# Patient Record
Sex: Male | Born: 1965
Health system: Southern US, Community
[De-identification: ages and names within clinical notes are randomized; demographics above are authoritative.]

## PROBLEM LIST (undated history)

## (undated) DIAGNOSIS — G4730 Sleep apnea, unspecified: Secondary | ICD-10-CM

## (undated) DIAGNOSIS — T7840XA Allergy, unspecified, initial encounter: Secondary | ICD-10-CM

## (undated) DIAGNOSIS — R51 Headache: Secondary | ICD-10-CM

## (undated) DIAGNOSIS — G4733 Obstructive sleep apnea (adult) (pediatric): Secondary | ICD-10-CM

## (undated) DIAGNOSIS — R0789 Other chest pain: Secondary | ICD-10-CM

## (undated) DIAGNOSIS — R197 Diarrhea, unspecified: Secondary | ICD-10-CM

## (undated) DIAGNOSIS — K219 Gastro-esophageal reflux disease without esophagitis: Secondary | ICD-10-CM

## (undated) DIAGNOSIS — K589 Irritable bowel syndrome without diarrhea: Secondary | ICD-10-CM

## (undated) DIAGNOSIS — K56609 Unspecified intestinal obstruction, unspecified as to partial versus complete obstruction: Secondary | ICD-10-CM

## (undated) DIAGNOSIS — E119 Type 2 diabetes mellitus without complications: Secondary | ICD-10-CM

## (undated) DIAGNOSIS — R42 Dizziness and giddiness: Secondary | ICD-10-CM

## (undated) DIAGNOSIS — R059 Cough, unspecified: Secondary | ICD-10-CM

## (undated) DIAGNOSIS — R05 Cough: Secondary | ICD-10-CM

## (undated) HISTORY — DX: Other chest pain: R07.89

## (undated) HISTORY — DX: Cough, unspecified: R05.9

## (undated) HISTORY — DX: Dizziness and giddiness: R42

## (undated) HISTORY — DX: Type 2 diabetes mellitus without complications: E11.9

## (undated) HISTORY — DX: Unspecified intestinal obstruction, unspecified as to partial versus complete obstruction: K56.609

## (undated) HISTORY — DX: Diarrhea, unspecified: R19.7

## (undated) HISTORY — DX: Cough: R05

## (undated) HISTORY — DX: Headache: R51

## (undated) HISTORY — DX: Irritable bowel syndrome, unspecified: K58.9

## (undated) HISTORY — DX: Obstructive sleep apnea (adult) (pediatric): G47.33

## (undated) HISTORY — DX: Allergy, unspecified, initial encounter: T78.40XA

## (undated) HISTORY — DX: Sleep apnea, unspecified: G47.30

## (undated) HISTORY — DX: Gastro-esophageal reflux disease without esophagitis: K21.9

## (undated) HISTORY — PX: LAPAROTOMY: SHX154

---

## 1992-12-08 HISTORY — PX: COLECTOMY: SHX59

## 1999-02-20 ENCOUNTER — Ambulatory Visit (HOSPITAL_COMMUNITY): Admission: RE | Admit: 1999-02-20 | Discharge: 1999-02-20 | Payer: Self-pay | Admitting: *Deleted

## 2000-09-28 ENCOUNTER — Encounter: Payer: Self-pay | Admitting: Internal Medicine

## 2000-09-28 ENCOUNTER — Encounter: Admission: RE | Admit: 2000-09-28 | Discharge: 2000-09-28 | Payer: Self-pay | Admitting: General Surgery

## 2000-09-28 ENCOUNTER — Encounter: Payer: Self-pay | Admitting: General Surgery

## 2000-10-01 ENCOUNTER — Encounter: Admission: RE | Admit: 2000-10-01 | Discharge: 2000-10-01 | Payer: Self-pay | Admitting: General Surgery

## 2001-10-13 ENCOUNTER — Inpatient Hospital Stay (HOSPITAL_COMMUNITY): Admission: EM | Admit: 2001-10-13 | Discharge: 2001-10-31 | Payer: Self-pay | Admitting: *Deleted

## 2001-10-13 ENCOUNTER — Encounter: Payer: Self-pay | Admitting: Family Medicine

## 2001-10-21 ENCOUNTER — Encounter: Payer: Self-pay | Admitting: Family Medicine

## 2001-10-28 ENCOUNTER — Encounter: Payer: Self-pay | Admitting: General Surgery

## 2001-11-08 ENCOUNTER — Encounter: Admission: RE | Admit: 2001-11-08 | Discharge: 2001-11-08 | Payer: Self-pay | Admitting: Family Medicine

## 2003-12-09 HISTORY — PX: GASTROSTOMY: SHX151

## 2004-05-26 ENCOUNTER — Inpatient Hospital Stay (HOSPITAL_COMMUNITY): Admission: EM | Admit: 2004-05-26 | Discharge: 2004-06-13 | Payer: Self-pay | Admitting: Emergency Medicine

## 2004-05-27 ENCOUNTER — Encounter (INDEPENDENT_AMBULATORY_CARE_PROVIDER_SITE_OTHER): Payer: Self-pay | Admitting: *Deleted

## 2004-06-11 ENCOUNTER — Encounter (INDEPENDENT_AMBULATORY_CARE_PROVIDER_SITE_OTHER): Payer: Self-pay | Admitting: *Deleted

## 2004-07-02 ENCOUNTER — Encounter (INDEPENDENT_AMBULATORY_CARE_PROVIDER_SITE_OTHER): Payer: Self-pay | Admitting: *Deleted

## 2007-12-09 HISTORY — PX: SPHINCTEROTOMY: SHX5279

## 2007-12-09 HISTORY — PX: CHOLECYSTECTOMY: SHX55

## 2008-02-10 ENCOUNTER — Encounter: Payer: Self-pay | Admitting: Internal Medicine

## 2008-02-15 ENCOUNTER — Inpatient Hospital Stay (HOSPITAL_COMMUNITY): Admission: EM | Admit: 2008-02-15 | Discharge: 2008-03-07 | Payer: Self-pay | Admitting: Emergency Medicine

## 2008-02-16 ENCOUNTER — Encounter: Payer: Self-pay | Admitting: Internal Medicine

## 2008-02-17 ENCOUNTER — Encounter (INDEPENDENT_AMBULATORY_CARE_PROVIDER_SITE_OTHER): Payer: Self-pay | Admitting: General Surgery

## 2008-02-18 ENCOUNTER — Encounter (INDEPENDENT_AMBULATORY_CARE_PROVIDER_SITE_OTHER): Payer: Self-pay | Admitting: *Deleted

## 2008-03-07 ENCOUNTER — Encounter: Payer: Self-pay | Admitting: Internal Medicine

## 2008-03-16 ENCOUNTER — Encounter: Payer: Self-pay | Admitting: Internal Medicine

## 2008-04-06 ENCOUNTER — Ambulatory Visit (HOSPITAL_BASED_OUTPATIENT_CLINIC_OR_DEPARTMENT_OTHER): Admission: RE | Admit: 2008-04-06 | Discharge: 2008-04-06 | Payer: Self-pay | Admitting: General Surgery

## 2008-04-06 ENCOUNTER — Encounter: Payer: Self-pay | Admitting: Internal Medicine

## 2008-04-12 ENCOUNTER — Encounter: Payer: Self-pay | Admitting: Internal Medicine

## 2008-07-05 ENCOUNTER — Encounter: Payer: Self-pay | Admitting: Internal Medicine

## 2009-03-23 IMAGING — CR DG ABDOMEN ACUTE W/ 1V CHEST
1 series · 1 of 1 positions shown · non-contrast
Comparison: CT abdomen and pelvis the same day.

CLINICAL DATA: Cholecystitis, abdominal pain and nausea.
 ACUTE ABDOMEN SERIES:

[view not recorded]
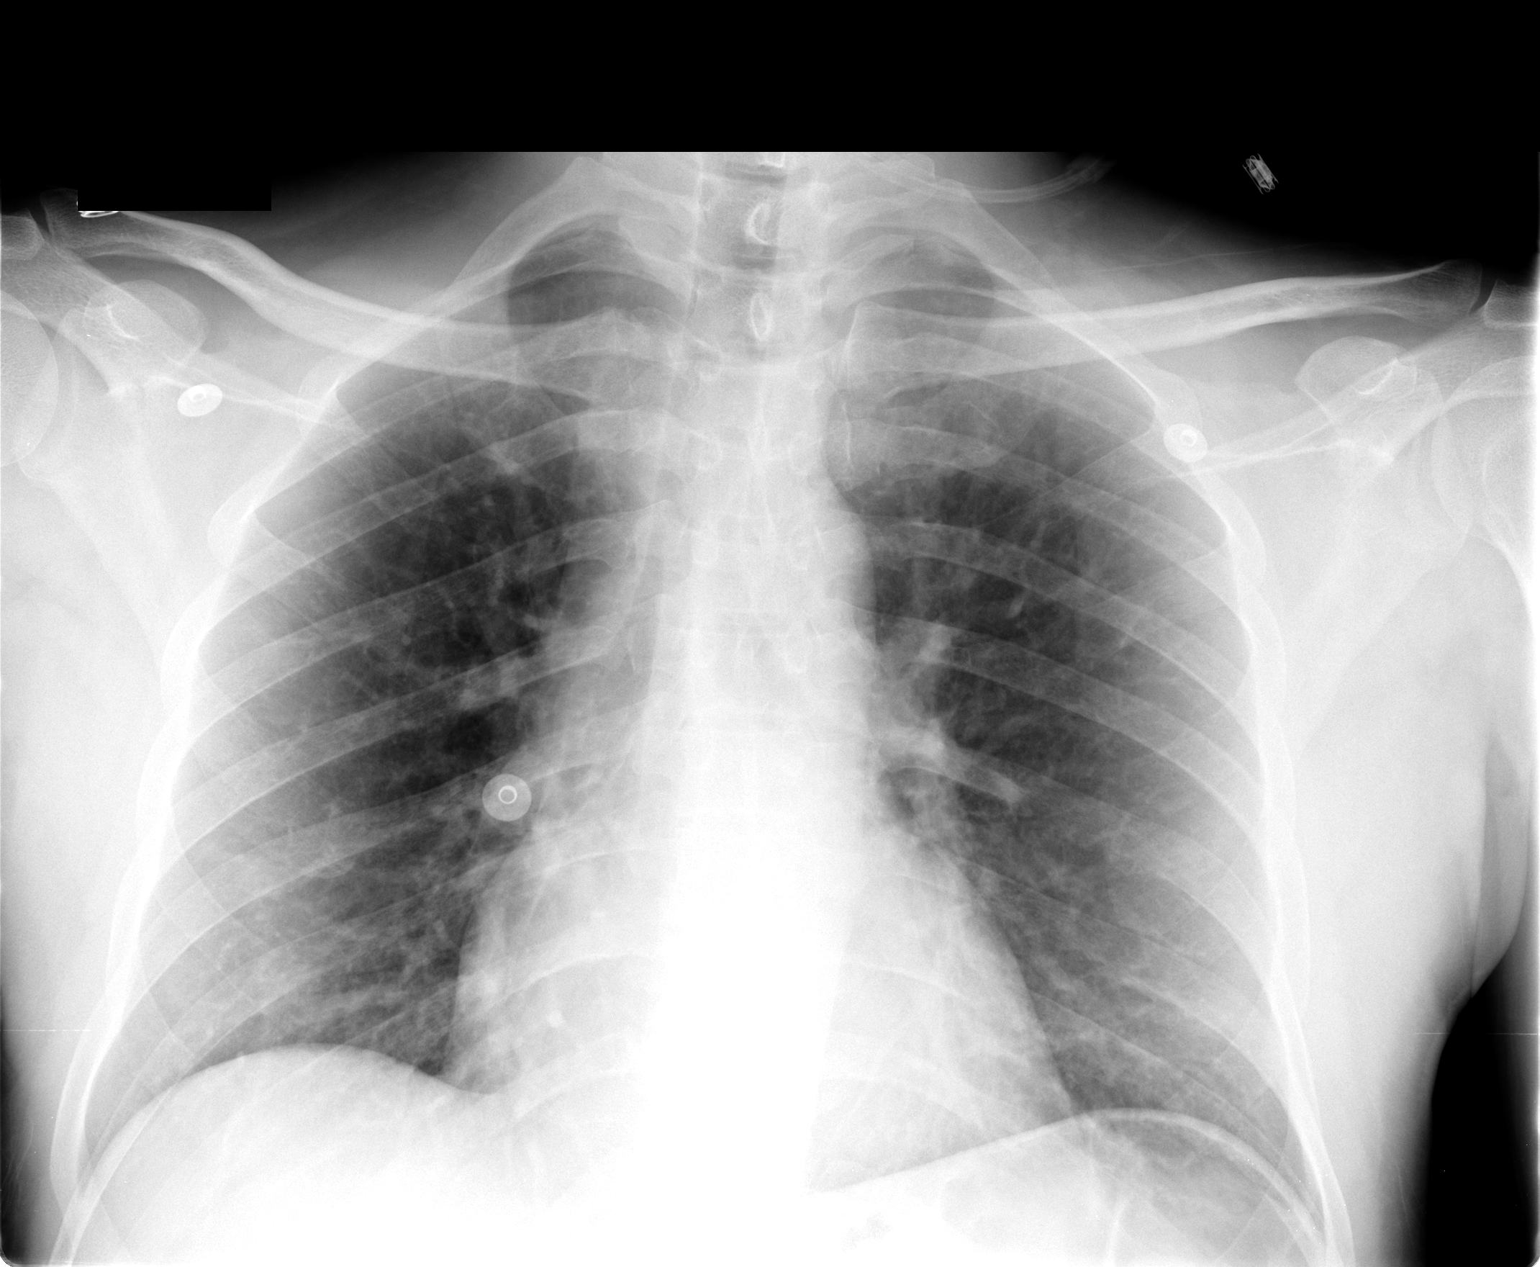

[1 of 1 positions shown; findings below may reference images not displayed]

FINDINGS: Trachea is midline.  Heart size within normal limits.  Lungs are clear.
 Two views of the abdomen show retained contrast in the colon.  There is small bowel dilatation centrally.  Gas is seen in the rectum.
IMPRESSION: Bowel gas pattern favors partial small bowel obstruction.

## 2009-03-23 IMAGING — CT CT PELVIS W/ CM
2 of 5 series · 16 of 46 positions shown, 18 images · IV contrast (APPLIED)
Comparison: Radiographs from 06/10/04 and earlier.

CLINICAL DATA: 42 year-old-male with history of prior bowel surgery for intestinal dysmotility and intermittent obstruction.  The patient now reports diffuse abdominal pain without nausea or vomiting. Also a history of cholelithiasis and possibly biliary colic.  
 ABDOMEN CT WITH CONTRAST:
TECHNIQUE: Multidetector CT imaging of the abdomen was performed following the standard protocol during bolus administration of intravenous contrast.
 Contrast:  100 cc Omnipaque 300.
TECHNIQUE: Multidetector CT imaging of the pelvis was performed following the standard protocol during bolus administration of intravenous contrast.

[Series 2: abd/pelv with 5.0 b31f st · axial · 0.72mm/px · z∈[-554,-78]mm · 13 of 107 slices shown, 15 images]
[im 6/107  soft-tissue]
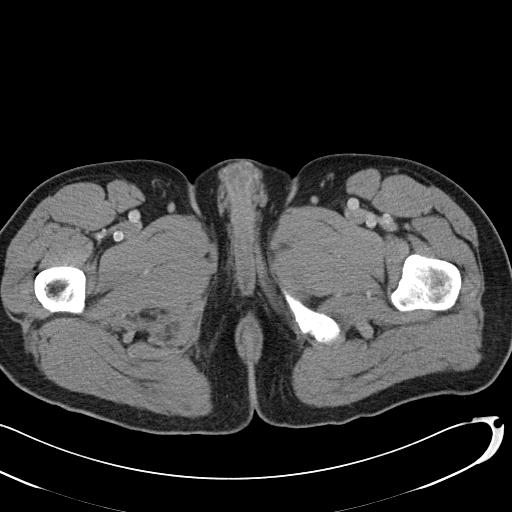
[im 6/107  bone]
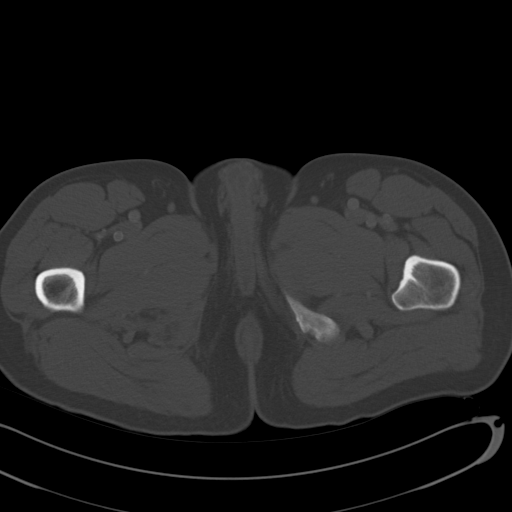
[im 17/107  soft-tissue]
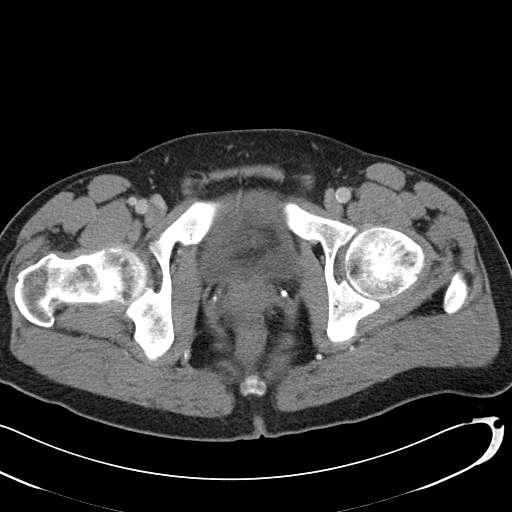
[im 23/107  soft-tissue]
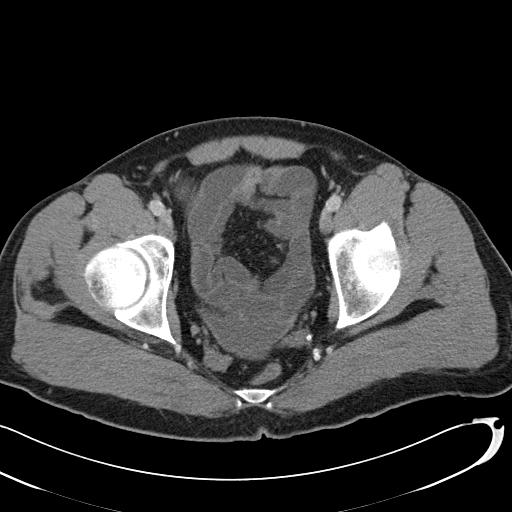
[im 28/107  soft-tissue]
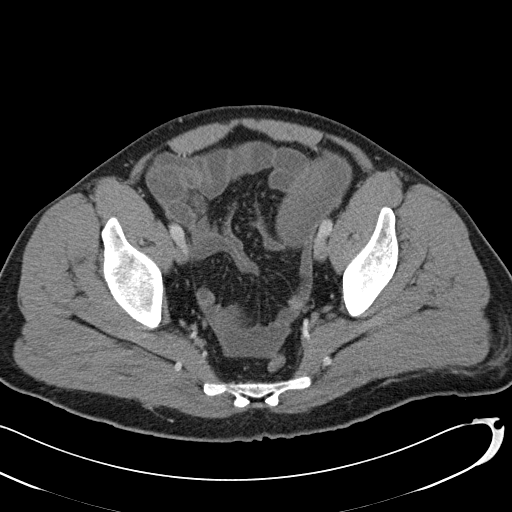
[im 40/107  soft-tissue]
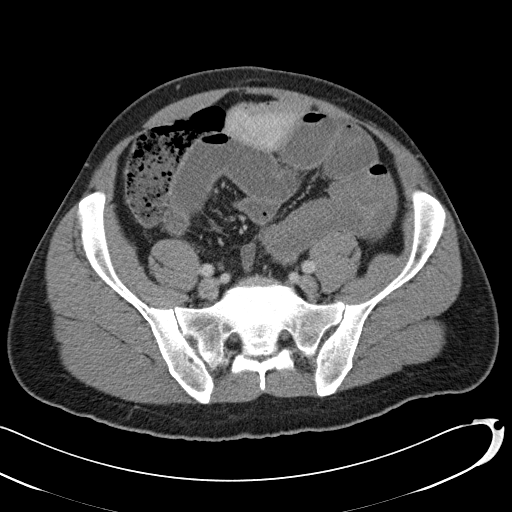
[im 45/107  soft-tissue]
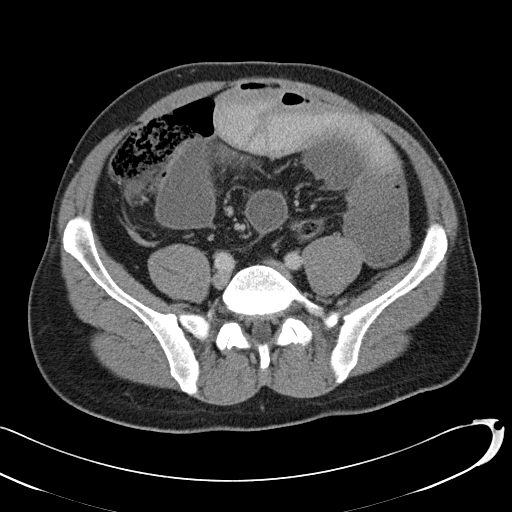
[im 56/107  soft-tissue]
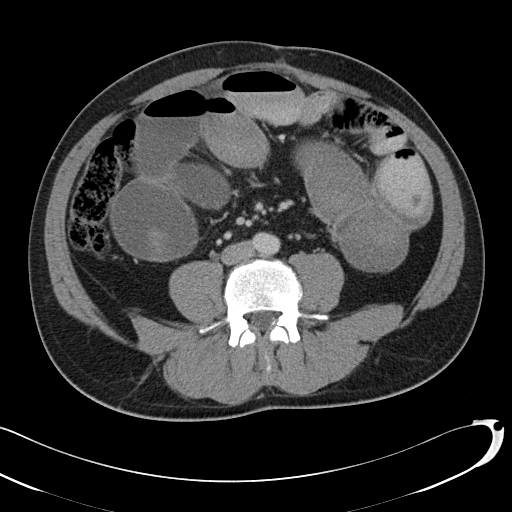
[im 62/107  soft-tissue]
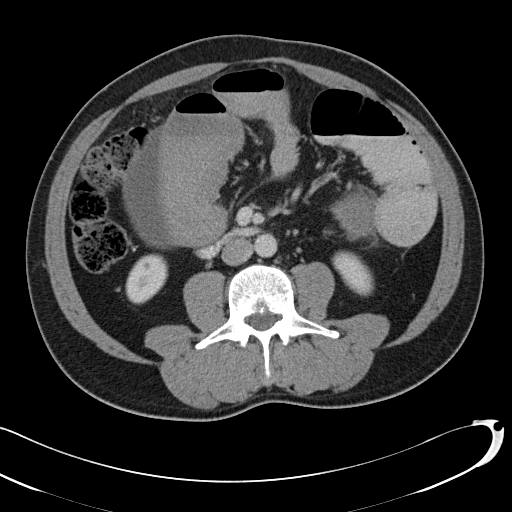
[im 67/107  soft-tissue]
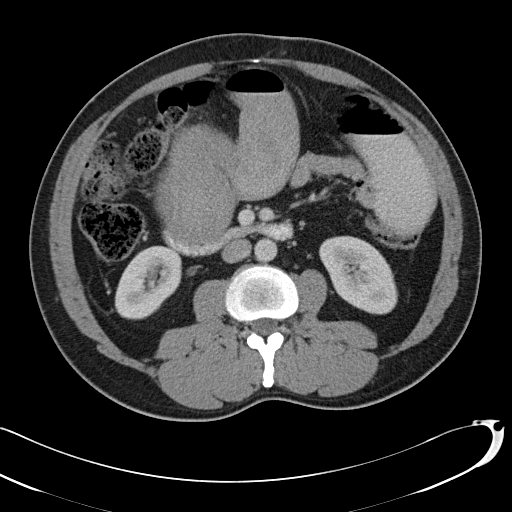
[im 67/107  bone]
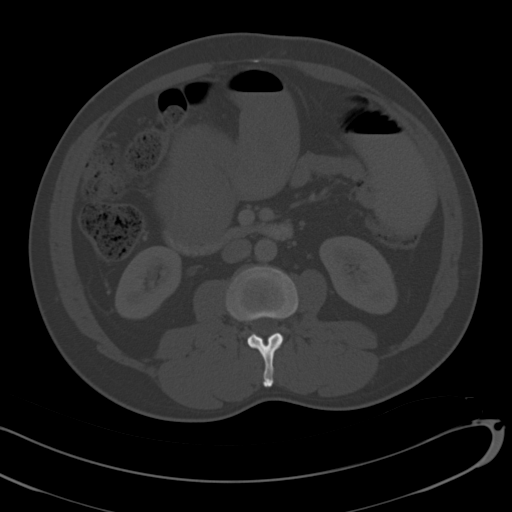
[im 79/107  soft-tissue]
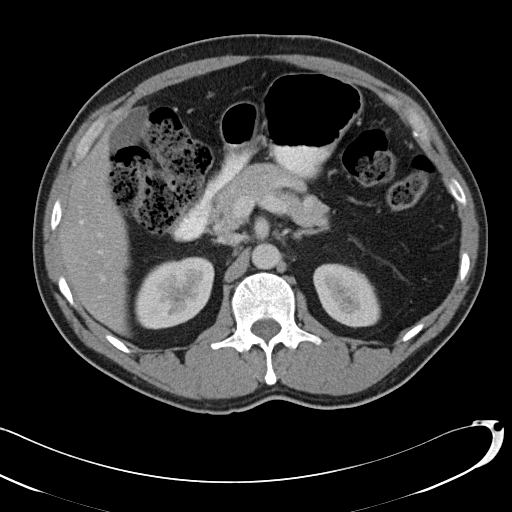
[im 84/107  soft-tissue]
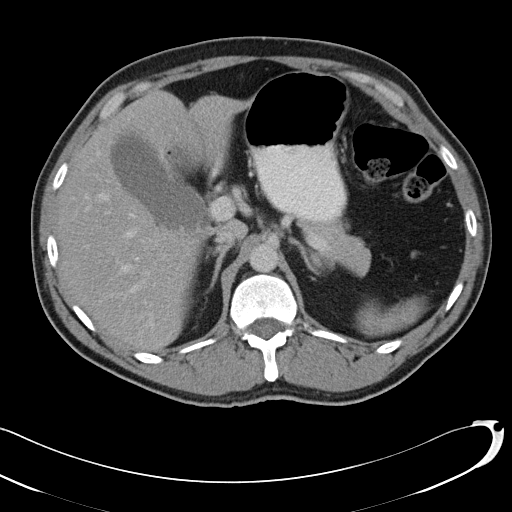
[im 90/107  soft-tissue]
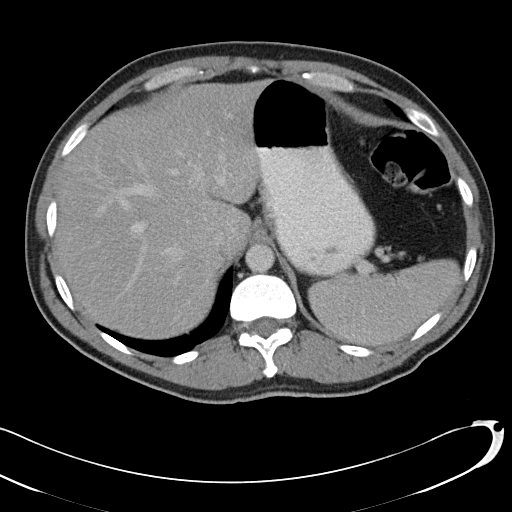
[im 101/107  soft-tissue]
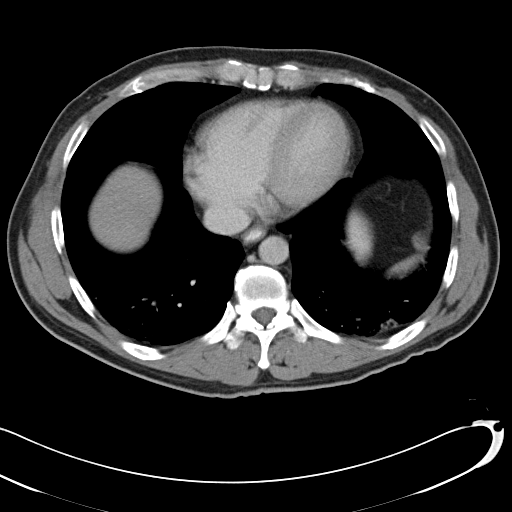

[Series 5: abd/pelv with 2.0 spo cor st · coronal · 1.04mm/px · 3 of 142 slices shown]
[im 48/142  soft-tissue]
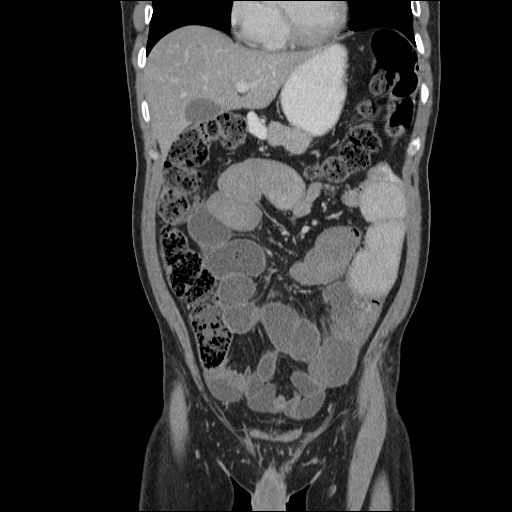
[im 63/142  soft-tissue]
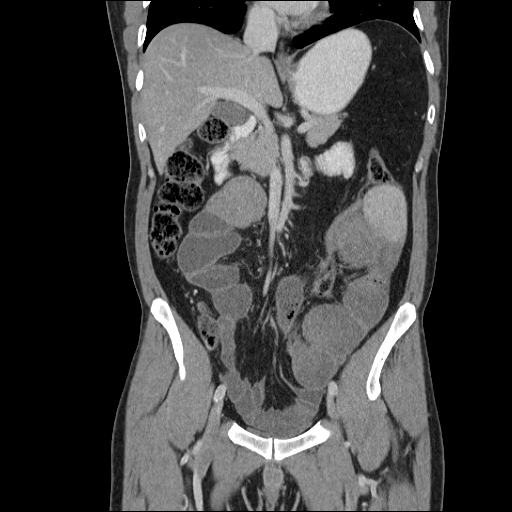
[im 79/142  soft-tissue]
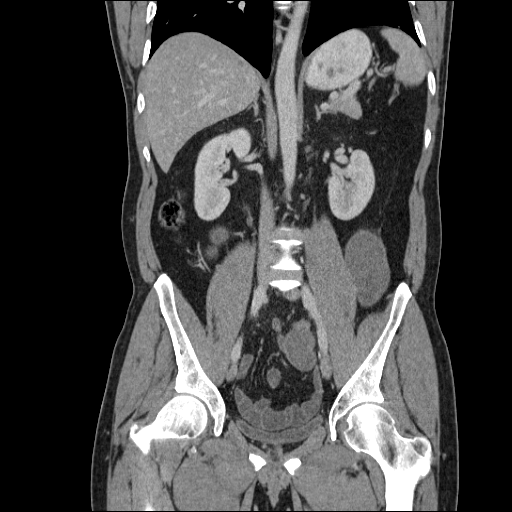

[16 of 46 positions shown; findings below may reference images not displayed]

FINDINGS: Dependent atelectasis at the lung bases.  Fatty infiltration of the liver. There is a subcentimeter low-density area in the right lobe which is too small to characterize.   There are dependent gallstones seen in the region of the gallbladder neck.  Major vascular structures including the portal venous system are patent.  Normal spleen, pancreas, adrenal glands and left kidney.  The right kidney is remarkable for a tiny lower pole cortical low-density area (probably a benign cyst) and a nonobstructing punctate lower pole calculus.  No lymphadenopathy or free fluid. No free air. 
 The stomach, duodenum and 1st segment of jejunum are decompressed, however, the small bowel rapidly becomes dilated up to 4 cm and multiple dilated stacked loops are present throughout the abdomen.  A relatively short transition to decompress loops is seen in the mid abdomen (series 2, images 57 through 77 where a loop in the central mesentery extends into the midline, inferiorly and then becomes decompressed.   Multiple decompressed terminal ileal loops are identified.    This may reflect adhesive disease.  The colon is remarkable for a moderate to large amount of retained stool proximally but is otherwise normal.  The descending and rectosigmoid colon are decompressed.  No suspicious osseous lesion.
IMPRESSION: 1.  High-grade small bowel obstruction with transition point seen in the central abdomen at the sacral level and decompressed terminal ileal loops.  This may reflect adhesive disease given the history of prior abdominal surgeries. 
 2.  Moderate to large amount of retained stool in the proximal colon, the distal colon is decompressed.   
 3.  Cholelithiasis.  
 4.  Nonobstructing right nephrolithiasis. 
 PELVIS CT WITH CONTRAST:
FINDINGS: Decompressed terminal ileal loops.  The bladder is decompressed and normal.  No free fluid.  Rectosigmoid colon is decompressed.  No lymphadenopathy.  No suspicious osseous lesion.
IMPRESSION: No acute findings in the pelvis.

## 2009-03-28 ENCOUNTER — Encounter: Payer: Self-pay | Admitting: Internal Medicine

## 2009-04-19 ENCOUNTER — Telehealth: Payer: Self-pay | Admitting: Gastroenterology

## 2009-08-01 ENCOUNTER — Encounter: Payer: Self-pay | Admitting: Internal Medicine

## 2009-08-08 ENCOUNTER — Encounter: Payer: Self-pay | Admitting: Internal Medicine

## 2009-08-31 ENCOUNTER — Encounter: Payer: Self-pay | Admitting: Internal Medicine

## 2009-09-13 ENCOUNTER — Encounter: Payer: Self-pay | Admitting: Internal Medicine

## 2009-09-13 ENCOUNTER — Encounter: Admission: RE | Admit: 2009-09-13 | Discharge: 2009-09-13 | Payer: Self-pay | Admitting: Gastroenterology

## 2009-09-26 ENCOUNTER — Encounter: Payer: Self-pay | Admitting: Internal Medicine

## 2009-11-30 ENCOUNTER — Ambulatory Visit: Payer: Self-pay | Admitting: Family Medicine

## 2009-12-01 ENCOUNTER — Inpatient Hospital Stay (HOSPITAL_COMMUNITY): Admission: EM | Admit: 2009-12-01 | Discharge: 2009-12-07 | Payer: Self-pay | Admitting: Emergency Medicine

## 2009-12-17 ENCOUNTER — Telehealth: Payer: Self-pay | Admitting: Internal Medicine

## 2010-01-02 ENCOUNTER — Telehealth (INDEPENDENT_AMBULATORY_CARE_PROVIDER_SITE_OTHER): Payer: Self-pay

## 2010-01-02 ENCOUNTER — Encounter (INDEPENDENT_AMBULATORY_CARE_PROVIDER_SITE_OTHER): Payer: Self-pay

## 2010-02-05 ENCOUNTER — Telehealth: Payer: Self-pay | Admitting: Internal Medicine

## 2010-02-05 ENCOUNTER — Ambulatory Visit: Payer: Self-pay | Admitting: Internal Medicine

## 2010-02-05 DIAGNOSIS — R142 Eructation: Secondary | ICD-10-CM

## 2010-02-05 DIAGNOSIS — Z8719 Personal history of other diseases of the digestive system: Secondary | ICD-10-CM | POA: Insufficient documentation

## 2010-02-05 DIAGNOSIS — R143 Flatulence: Secondary | ICD-10-CM

## 2010-02-05 DIAGNOSIS — R141 Gas pain: Secondary | ICD-10-CM | POA: Insufficient documentation

## 2010-02-05 DIAGNOSIS — R197 Diarrhea, unspecified: Secondary | ICD-10-CM | POA: Insufficient documentation

## 2010-02-11 ENCOUNTER — Telehealth: Payer: Self-pay | Admitting: Internal Medicine

## 2010-03-12 ENCOUNTER — Ambulatory Visit: Payer: Self-pay | Admitting: Internal Medicine

## 2010-03-12 DIAGNOSIS — K589 Irritable bowel syndrome without diarrhea: Secondary | ICD-10-CM | POA: Insufficient documentation

## 2010-03-12 DIAGNOSIS — K219 Gastro-esophageal reflux disease without esophagitis: Secondary | ICD-10-CM | POA: Insufficient documentation

## 2010-04-05 ENCOUNTER — Encounter: Admission: RE | Admit: 2010-04-05 | Discharge: 2010-04-05 | Payer: Self-pay | Admitting: Family Medicine

## 2010-04-11 ENCOUNTER — Telehealth: Payer: Self-pay | Admitting: Internal Medicine

## 2010-08-19 ENCOUNTER — Telehealth: Payer: Self-pay | Admitting: Internal Medicine

## 2010-08-20 ENCOUNTER — Ambulatory Visit: Payer: Self-pay | Admitting: Gastroenterology

## 2010-08-20 DIAGNOSIS — R519 Headache, unspecified: Secondary | ICD-10-CM | POA: Insufficient documentation

## 2010-08-20 DIAGNOSIS — R05 Cough: Secondary | ICD-10-CM

## 2010-08-20 DIAGNOSIS — R51 Headache: Secondary | ICD-10-CM | POA: Insufficient documentation

## 2010-08-20 DIAGNOSIS — R059 Cough, unspecified: Secondary | ICD-10-CM | POA: Insufficient documentation

## 2010-08-22 ENCOUNTER — Telehealth: Payer: Self-pay | Admitting: Internal Medicine

## 2010-09-04 ENCOUNTER — Telehealth: Payer: Self-pay | Admitting: Internal Medicine

## 2010-09-16 ENCOUNTER — Ambulatory Visit: Payer: Self-pay | Admitting: Internal Medicine

## 2010-09-30 ENCOUNTER — Telehealth: Payer: Self-pay | Admitting: Internal Medicine

## 2010-10-07 ENCOUNTER — Ambulatory Visit: Payer: Self-pay | Admitting: Pulmonary Disease

## 2010-10-07 ENCOUNTER — Encounter: Payer: Self-pay | Admitting: Pulmonary Disease

## 2010-10-10 ENCOUNTER — Encounter: Admission: RE | Admit: 2010-10-10 | Discharge: 2010-10-10 | Payer: Self-pay | Admitting: General Surgery

## 2010-10-10 ENCOUNTER — Encounter: Payer: Self-pay | Admitting: Internal Medicine

## 2010-10-23 ENCOUNTER — Ambulatory Visit: Payer: Self-pay | Admitting: Pulmonary Disease

## 2010-10-23 DIAGNOSIS — G4733 Obstructive sleep apnea (adult) (pediatric): Secondary | ICD-10-CM | POA: Insufficient documentation

## 2010-11-11 ENCOUNTER — Ambulatory Visit: Payer: Self-pay | Admitting: Pulmonary Disease

## 2010-11-20 ENCOUNTER — Telehealth (INDEPENDENT_AMBULATORY_CARE_PROVIDER_SITE_OTHER): Payer: Self-pay | Admitting: *Deleted

## 2010-11-25 ENCOUNTER — Telehealth (INDEPENDENT_AMBULATORY_CARE_PROVIDER_SITE_OTHER): Payer: Self-pay | Admitting: *Deleted

## 2010-12-20 ENCOUNTER — Ambulatory Visit: Admit: 2010-12-20 | Payer: Self-pay | Admitting: Pulmonary Disease

## 2011-01-07 NOTE — Letter (Signed)
Summary: [facility]   Imported By: Lester [place] 02/12/2010 11:29:27  _____________________________________________________________________  External Attachment:    Type:   Image     Comment:   External Document

## 2011-01-07 NOTE — Letter (Signed)
Summary: [facility]   Imported By: Lester [place] 02/12/2010 11:30:36  _____________________________________________________________________  External Attachment:    Type:   Image     Comment:   External Document

## 2011-01-07 NOTE — Procedures (Signed)
Summary: [facility]   Imported By: Lester [place] 11/04/2010 07:35:59  _____________________________________________________________________  External Attachment:    Type:   Image     Comment:   External Document

## 2011-01-07 NOTE — Progress Notes (Signed)
Summary: Pain in the neck  Phone Note Call from Patient   Call For: Dr Leone Payor Reason for Call: Talk to Nurse Summary of Call: Pain in the neck. Initial call taken by: Leanor Kail [facility],  September 30, 2010 1:56 PM  Follow-up for Phone Call        Patient  wants Dr Leone Payor to refer him to someone because of his neck pain.  I have advised him he should start with Dr Perrin Maltese his primary care and have them refer him out if necessary.  Dr Leone Payor he would like you to still be aware and see if you have any ideas.  He c/o stiff neck and difficulty turning his head.   Follow-up by: Darcey Nora RN, CGRN,  September 30, 2010 2:21 PM  Additional Follow-up for Phone Call Additional follow up Details #1::        Primary care eval Additional Follow-up by: Iva Boop MD, Clementeen Graham,  October 01, 2010 8:33 AM    Additional Follow-up for Phone Call Additional follow up Details #2::    I have left a voicemail for the patient with Dr Marvell Fuller recommendations. Follow-up by: Darcey Nora RN, CGRN,  October 01, 2010 8:37 AM

## 2011-01-07 NOTE — Progress Notes (Signed)
Summary: TRIAGE  Phone Note Call from Patient Call back at Home Phone [phone]   Caller: Patient Call For: Dr Leone Payor Reason for Call: Talk to Nurse Summary of Call: Patient wants to be seen asap states that he could not sleep last night with acid reflux, stomach pain, constipation. Initial call taken by: Tawni Levy,  August 22, 2010 2:22 PM  Follow-up for Phone Call        Was seen by Willette Cluster NP 08-20-10. Has been taking Nexium two times a day, no improvement, pt. states he feels worse.  C/O cough, stomache/chest pain,  headache, constipation X2 days.  Denies n/v, fever, blood, black stools.   DR.GESSNER PLEASE ADVISE  Follow-up by: Laureen Ochs LPN,  August 22, 2010 3:21 PM  Additional Follow-up for Phone Call Additional follow up Details #1::        Metronidazole rx written for probable bacterial overgrowth remind him we used antibiotics before with success do not have samples of xifaxan this time and it is not on his formulary he needs to give this treatment and what Gunnar Fusi told him more time for what sounds like exacerbation of chronic problems. he should see me in October  Additional Follow-up by: Iva Boop MD, Clementeen Graham,  August 22, 2010 4:03 PM    Additional Follow-up for Phone Call Additional follow up Details #2::    Above MD orders reviewed with patient. Pt. to keep scheduled office visit on 09-16-10 at 9:30am. Pt. instructed to call back as needed.  Follow-up by: Laureen Ochs LPN,  August 22, 2010 4:20 PM  New/Updated Medications: METRONIDAZOLE 250 MG  TABS (METRONIDAZOLE) Take 1 three times a day  times 10 days Prescriptions: METRONIDAZOLE 250 MG  TABS (METRONIDAZOLE) Take 1 three times a day  times 10 days  #30 x 0   Entered and Authorized by:   Iva Boop MD, [facility]   Signed by:   Iva Boop MD, FACG on 08/22/2010   Method used:   Electronically to        [facility] [phone]* (retail)       [address]       [place], [place]  [address]       Ph: [phone]       Fax: [phone]   RxID:   [id number]

## 2011-01-07 NOTE — Progress Notes (Signed)
Summary: Schedule appt  Phone Note Call from Patient Call back at Home Phone [phone]   Caller: Patient Call For: Pt  Summary of Call: Patient  called updet we had to reschedule his office visit with Dr Leone Payor.  Patient  wants to keep his original appointment.  I explained to him that Dr Leone Payor requested extra time with him and that we had to reschedule his office visit.  Patient  was demanding to be seen tomorrow .  Patient  reports he was in the hospital 2 weeks ago for obstuction and that he had an incident last week with pain.  Patient  reports he was able to stop eating and change to liquids and the pain resolved.  Patient says he was to be referred to the [facility] and that he can get an appointment there before here.  I advised him I would discuss this with Dr Leone Payor and we will call him back.  I did discuss the above with Dr Leone Payor as of now his appointment is still scheduled for 02-05-10, Dr Leone Payor will review his previous records and at some point contact the patient.  Patient  was last seen at Dr [facility] office 12-14-09. Initial call taken by: Darcey Nora RN, CGRN,  January 02, 2010 1:36 PM  Follow-up for Phone Call        I am unable to see him sooner for this chronic problem Follow-up by: Iva Boop MD, [facility] Graham,  January 17, 2010 1:38 PM

## 2011-01-07 NOTE — Letter (Signed)
Summary: [facility]   Imported By: Sherian Rein 11/06/2010 10:46:36  _____________________________________________________________________  External Attachment:    Type:   Image     Comment:   External Document

## 2011-01-07 NOTE — Letter (Signed)
Summary: [facility]  [facility]   Imported By: Lester [place] 02/12/2010 11:31:51  _____________________________________________________________________  External Attachment:    Type:   Image     Comment:   External Document

## 2011-01-07 NOTE — Letter (Signed)
Summary: [facility]  [facility]   Imported By: Lester [place] 02/12/2010 11:40:19  _____________________________________________________________________  External Attachment:    Type:   Image     Comment:   External Document

## 2011-01-07 NOTE — Assessment & Plan Note (Signed)
Summary: ABD.PAIN, NAUSEA, COUGH      (DR.GESSNER PT.)   DEBORAH   History of Present Illness Visit Type: Follow-up Visit Primary GI MD: Stan Head MD [facility] Primary Provider: Robert Bellow, MD Requesting Provider: Glenna Fellows, MD Chief Complaint: ABD pain, chest pain, cough, nausea x 3 weeks History of Present Illness:   Patient is a 45 year old male from [place] followed by  Dr. Leone Payor for history of recurrent small bowel obstructions. Had partial colectomy in [place] in 1990's for unclear reasons (? colonic dysmotility) and since then has had recurrent small bowel obstructions. He has seen multiple physicians here, including Drs. Blase Mess and [facility]. Patient last seen April 2011. Today he has multiple complaints: headache, low grade fevers, cough, chest burning, abdominal discomfort, bloating (chronic) and postprandial diarrhea (chronic). Afebrile today.   Cough, which is productive of phlegm, started three weeks ago after after exposure to leaking battery.  Saw PCP, apparently CXR negative and was given something for cough which didn't help. His chest is burning. Has been off Nexium for several months. Patient not sure why he chose to stop it.    He has frequent post-prandial diarrhea and occasional constipation depending on diet. Cheese causes immediate diarrhea. Gets pre-defecatory cramps totally relieved with bowel movements. He has chronic bloating and intermittent nausea.         GI Review of Systems    Reports abdominal pain, bloating, and  chest pain.     Location of  Abdominal pain: generalized.    Denies acid reflux, belching, dysphagia with liquids, dysphagia with solids, heartburn, loss of appetite, nausea, vomiting, vomiting blood, weight loss, and  weight gain.        Denies anal fissure, black tarry stools, change in bowel habit, constipation, diarrhea, diverticulosis, fecal incontinence, heme positive stool, hemorrhoids, irritable bowel syndrome, jaundice,  light color stool, liver problems, rectal bleeding, and  rectal pain.    Current Medications (verified): 1)  Ambien 10 Mg Tabs (Zolpidem Tartrate) .... Take One By Mouth At Bedtime 2)  Hydrocodone-Acetaminophen 5-500 Mg Tabs (Hydrocodone-Acetaminophen) .... Take One By Mouth At Bedtime 3)  Cyclobenzaprine Hcl 10 Mg Tabs (Cyclobenzaprine Hcl) .Marland Kitchen.. 1 By Mouth Up To Three Times A Day As Needed For Muscle Spasm 4)  Multivitamins  Tabs (Multiple Vitamin) .... Once Daily  Allergies (verified): No Known Drug Allergies  Past History:  Past Medical History: Reviewed history from 03/12/2010 and no changes required. Anal Fissure Small Bowel Obstructions GERD Anxiety Chronic Pain Chronic bloating and abdominal distention (IBS and pseudoobstruction)  Past Surgical History: Reviewed history from 02/05/2010 and no changes required. Segmental Colectomy 1994 [place], ? of colonic atony Laparotomy 2002,2005,2009 Stamm gastrostomy 2005 Cholecystectomy 2009 Lateral internal sphincterotomy 2009  Family History: Reviewed history from 02/05/2010 and no changes required. Family History of Liver Disease/Cirrhosis: father No FH of Colon Cancer:  Social History: Reviewed history from 03/12/2010 and no changes required. 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  Alcohol Use - no Illicit Drug Use - no Married-Divorced- Married again 2009  - wife is in [place] with infant  son  Review of Systems       The patient complains of cough, fever, headaches-new, sleeping problems, and sore throat.  The patient denies allergy/sinus, anemia, anxiety-new, arthritis/joint pain, back pain, blood in urine, breast changes/lumps, change in vision, confusion, coughing up blood, depression-new, fainting, fatigue, hearing problems, heart murmur, heart rhythm changes, itching, menstrual pain, muscle pains/cramps, night sweats, nosebleeds, pregnancy symptoms,  shortness of breath, skin  rash, swelling of feet/legs, swollen lymph glands, thirst - excessive , urination - excessive , urination changes/pain, urine leakage, vision changes, and voice change.    Vital Signs:  Patient profile:   45 year old male Height:      73 inches Weight:      188.50 pounds BMI:     24.96 Temp:     98.3 degrees F oral Pulse rate:   80 / minute Pulse rhythm:   regular BP sitting:   110 / 70  (left arm) Cuff size:   regular  Vitals Entered By: June McMurray CMA Duncan Dull) (August 20, 2010 11:01 AM)  Physical Exam  General:  Well developed, well nourished, no acute distress. Head:  Normocephalic and atraumatic. Eyes:  Conjunctiva pink, no icterus.  Mouth:  No oral lesions. Tongue moist.  Neck:  no obvious masses  Lungs:  Clear throughout to auscultation. Heart:  Regular rate and rhythm; no murmurs, rubs,  or bruits. Abdomen:  Abdomen soft, nontender, nondistended. No obvious masses or hepatomegaly.Normal bowel sounds.  Msk:  Symmetrical with no gross deformities. Normal posture. Extremities:  No palmar erythema, no edema.  Neurologic:  Alert and  oriented x4;  grossly normal neurologically. Skin:  Intact without significant lesions or rashes. Cervical Nodes:  No significant cervical adenopathy. Psych:  Alert and cooperative. Normal mood and affect.   Impression & Recommendations:  Problem # 1:  COUGH (ICD-786.2) Assessment New Associated with bitter taste in mouth and burning in chest. Off Nexium for several months for unclear reasons. Apparently CXR by PCP was negative. His symptoms may be secondary to GERD. Will restart PPI, twice daily for 14 days then once daily. Anti-reflux literature given. Patient will call with condition update in two weeks.   Problem # 2:  GERD (ICD-530.81) Assessment: Comment Only See #1.   Problem # 3:  SMALL BOWEL OBSTRUCTION, HX OF (ICD-V12.79) Assessment: Comment Only He has chronic nausea, bloating and post-prandial loose stools. Abdominal  examination is benign. Nothing in today's history and physical to suggest recurrent bowel obstruction.    Problem # 4:  IRRITABLE BOWEL SYNDROME (ICD-564.1) Assessment: Deteriorated Post-prandial loose stools, pre-defecatory cramps,  and bloating, are compatible with IBS. History of partial colectomy in 1990's. His symptoms are chronic. Treated in April with Xifaxin for posible small bowel bacterial overgrowth, helped for a while but expensive. We discussed IBS and I gave him written literature as well. If no improvement in symptoms we can retreat for small bowel bacterial overgrowth. If Xifaxin too expensive can try Flagyl. No alarm features (weight loss, rectal bleeding).  Problem # 5:  HEADACHE (ICD-784.0) Assessment: Comment Only Will defer treatment of headache, fatigue and generalized body aches to PCP.   Patient Instructions: 1)  Restart daily, 1 capsule  Nexium, 30 min prior to your main meal then 12 hours later, (twice a day).  2)  We have given you samples of Nexium. 3)  GERD brochure given. 4)  Irratable Bowel Syndrome  and IBS brochure given. 5)  Copy sent to : Robert Bellow, MD 6)  The medication list was reviewed and reconciled.  All changed / newly prescribed medications were explained.  A complete medication list was provided to the patient / caregiver. Prescriptions: NEXIUM 40 MG CPDR (ESOMEPRAZOLE MAGNESIUM) Take 1 capsule 30 min prior to your main meal.  #30 x 3   Entered by:   Lowry Ram NCMA   Authorized by:   Willette Cluster NP  Signed by:   Lowry Ram NCMA on 08/20/2010   Method used:   Electronically to        [facility]. [phone]* (retail)       [address]       [place], [place]  [address]       Ph: [phone]       Fax: [phone]   RxID:   [id number]

## 2011-01-07 NOTE — Progress Notes (Signed)
Summary: Triage  Phone Note Call from Patient Call back at Home Phone [phone]   Caller: Patient Call For: Dr. Leone Payor Reason for Call: Talk to Nurse Summary of Call: pt. is asking to speak directly to nurse. pt has been coughing and having chest pain Initial call taken by: Karna Christmas,  August 19, 2010 3:03 PM  Follow-up for Phone Call        Message left for patient to callback.  Laureen Ochs LPN  August 19, 2010 4:55 PM     Additional Follow-up for Phone Call Additional follow up Details #1::        Pt. c/o abd. pain for 3-4 days, coughing and feels bad. Was given cough med from PCP, doesn't help. Very nauseated. Some constipation. Pt. wants to be seen ASAP.  Pt. will see Willette Cluster NP this morning at 11am.  Additional Follow-up by: Laureen Ochs LPN,  August 20, 2010 9:51 AM

## 2011-01-07 NOTE — Letter (Signed)
[facility]   Imported By: Lester [place] 02/12/2010 11:33:49  _____________________________________________________________________  External Attachment:    Type:   Image     Comment:   External Document

## 2011-01-07 NOTE — Letter (Signed)
Summary: New Patient letter  [facility]  [address] [place], [place] [address]   Phone: [phone]  Fax: [phone]       01/02/2010 MRN: [id number]  [facility] [address] [place], [place]  [address]  Dear [name],  Welcome to the Gastroenterology Division at [facility].    You are scheduled to see Dr.  Leone Payor  on  02-05-10   at 11:00 on the 3rd floor at [facility], [address]  We ask that you try to arrive at our office 15 minutes prior to your appointment time to allow for check-in.  I have had to reschedule your February appointment  to this date and time, I appologize for the inconvenience.  We would like you to complete the enclosed self-administered evaluation form prior to your visit and bring it with you on the day of your appointment.  We will review it with you.  Also, please bring a complete list of all your medications or, if you prefer, bring the medication bottles and we will list them.  Please bring your insurance card so that we may make a copy of it.  If your insurance requires a referral to see a specialist, please bring your referral form from your primary care physician.  Co-payments are due at the time of your visit and may be paid by cash, check or credit card.     Your office visit will consist of a consult with your physician (includes a physical exam), any laboratory testing he/she may order, scheduling of any necessary diagnostic testing (e.g. x-ray, ultrasound, CT-scan), and scheduling of a procedure (e.g. Endoscopy, Colonoscopy) if required.  Please allow enough time on your schedule to allow for any/all of these possibilities.    If you cannot keep your appointment, please call [phone] to cancel or reschedule prior to your appointment date.  This allows [place] the opportunity to schedule an appointment for another patient in need of care.  If you do not cancel or reschedule by 5 p.m. the business day  prior to your appointment date, you will be charged a $50.00 late cancellation/no-show fee.    Thank you for choosing [facility] for your medical needs.  We appreciate the opportunity to care for you.  Please visit [place] at our website  to learn more about our practice.                     Sincerely,                                                             The Gastroenterology Division

## 2011-01-07 NOTE — Assessment & Plan Note (Signed)
Summary: FOLLOW-UP FROM APPT. W/PAULA         DEBORAH   History of Present Illness Visit Type: Follow-up Visit Primary GI MD: Stan Head MD [facility] Primary Provider: Robert Bellow, MD Requesting Provider: Glenna Fellows, MD Chief Complaint: abdominal pain , diarrhea,cough History of Present Illness:   45 yo man from [place] with IBS, chronic other somatic complaints and prior small bowel obstructions after a remote surgery to intestines in [place].  He has been seen by our NP and took metronidazole and now is just finishing a course of Xifaxan. In the past his gas and bloating have responded to the Xifaxan, which was not taken at first this time due to cost. He has had persistant and typica diffuse abdominal pain with bloating for 3-4 weeks, coughing with soreness in chest  Thinks he is a little better after Xifaxan (just finishing) He is using occasional MiraLax, still has chronic variable bowel habits and abdominal pain. Some burning in chest despite Nexium. Cough is better but still there. Activity is off due to these sxs, only working and sleeping. Working second shift. Work is ok. Weight is up 188 to 195. increased 7# (wife cooking).  Still taking narcotic and hypnotic at bedtime. Feels sore all over. wonders   Throat always feels scratchy and produces phlegm.  Family here, niow, wife and son arrived from [place] in past few months. Work is going well.   GI Review of Systems    Reports abdominal pain and  chest pain.      Denies acid reflux, belching, bloating, dysphagia with liquids, dysphagia with solids, heartburn, loss of appetite, nausea, vomiting, vomiting blood, weight loss, and  weight gain.      Reports diarrhea.     Denies anal fissure, black tarry stools, change in bowel habit, constipation, diverticulosis, fecal incontinence, heme positive stool, hemorrhoids, irritable bowel syndrome, jaundice, light color stool, liver problems, rectal bleeding, and  rectal  pain.    Current Medications (verified): 1)  Ambien 10 Mg Tabs (Zolpidem Tartrate) .... Take One By Mouth At Bedtime 2)  Hydrocodone-Acetaminophen 5-500 Mg Tabs (Hydrocodone-Acetaminophen) .... Take One By Mouth At Bedtime 3)  Cyclobenzaprine Hcl 10 Mg Tabs (Cyclobenzaprine Hcl) .Marland Kitchen.. 1 By Mouth Up To Three Times A Day As Needed For Muscle Spasm Hold 4)  Multivitamins  Tabs (Multiple Vitamin) .... Once Daily  Hold 5)  Nexium 40 Mg Cpdr (Esomeprazole Magnesium) .... Take 1 Capsule 30 Min Prior To Your Main Meal. 6)  Xifaxan 550 Mg Tabs (Rifaximin) .... Take One By Mouth Three Times Daily For 10 Days.  Allergies (verified): No Known Drug Allergies  Past History:  Past Medical History: Reviewed history from 03/12/2010 and no changes required. Anal Fissure Small Bowel Obstructions GERD Anxiety Chronic Pain Chronic bloating and abdominal distention (IBS and pseudoobstruction)  Past Surgical History: Reviewed history from 02/05/2010 and no changes required. Segmental Colectomy 1994 [place], ? of colonic atony Laparotomy 2002,2005,2009 Stamm gastrostomy 2005 Cholecystectomy 2009 Lateral internal sphincterotomy 2009  Family History: Reviewed history from 02/05/2010 and no changes required. Family History of Liver Disease/Cirrhosis: father No FH of Colon Cancer:  Social History: Reviewed history from 03/12/2010 and no changes required. 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  Alcohol Use - no Illicit Drug Use - no Married-Divorced- Married again 2009  - wife is in [place] with infant  son  Review of Systems       change in vision, needed differet glass Rx  Vital  Signs:  Patient profile:   45 year old male Height:      73 inches Weight:      195.38 pounds BMI:     25.87 Pulse rate:   60 / minute Pulse rhythm:   regular BP sitting:   110 / 60  (left arm) Cuff size:   regular  Vitals Entered By: June McMurray CMA Duncan Dull) (September 16, 2010 9:24 AM)  Physical Exam  General:  Well developed, well nourished, no acute distress. Mouth:  mild posterior pharyngeal erythema Lungs:  Normal respiratory effort, chest expands symmetrically. Lungs are clear to auscultation, no crackles or wheezes. Heart:  Normal rate and regular rhythm. S1 and S2 normal without gallop, murmur, click, rub or other extra sounds. Abdomen:  Bowel sounds positive,abdomen soft and non-tender without masses, organomegaly or hernias noted.   Impression & Recommendations:  Problem # 1:  IRRITABLE BOWEL SYNDROME (ICD-564.1) Assessment Deteriorated I think this is his main problem and may also have pseudoobstruction and bacterial overgrowth. Did not resond as well to Xifaxan this time, it seems. Am going to try amitriptylline which may help his IBS, body pain and insomnia. Hopefully it will do this and allow for him to come off ambien and hydtrocodone.  Problem # 2:  GERD (ICD-530.81) Assessment: Deteriorated soem heartburn problems agaain ? if this is hypersensitive esophagus or GERD or both some throat sxs also, with cough = am not definitively associating with GERD but possible. have recommended EGD and he will try to schedule (see diarrhea assessment) Risks, benefits,and indications of endoscopic procedure(s) were reviewed with the patient and all questions answered.  Problem # 3:  COUGH (ICD-786.2) will refer to Dr. Craige Cotta for an evaluation of this patient requesting evaluation and given his overall situation would be reasuring, I hope could be some GERD, allergies, other? often hatrd to tell with him as he frequently has diffusely + ROS  Problem # 4:  DIARRHEA, RECURRENT (ICD-787.91) Assessment: Improved I have thought it is IBS and possible bacterial overgrowth he has not had a colonoscopy that I can tell though he thinks he has I have recommended this to further evaluate, thogh IBS favored, mucosal evaluation prudent Risks, benefits,and  indications of endoscopic procedure(s) were reviewed with the patient and all questions answered. He works second shift and scheduling this is going to be difficult, he knows to call us back to do so. Plan would be PM procedures and do prep in AM after work so he only misses 1 day of work needs to obtain a driver  Problem # 5:  FLATULENCE ERUCTATION AND GAS PAIN (ICD-787.3) Assessment: Improved somewhat better on Xifaxan but never totally gone  Patient Instructions: 1)  Please pick up your medications at your pharmacy. 2)  Amitriptylline was prescribed. Start that medication as written on the prescription. 3)  Try to avoid using the Ambien when you start the amitriptylline as both may ake you sleepy. You should get used to the amitriptyllne over time and it is being started at low-dose to try to avoid side effects. It will take 2 months to see if it works. After being on that for a couple of weeks, try to stop the hydrocodone, you may need to drop down by taking a half a tablet at a time to satart and then stop.  4)  Dr. Leone Payor recomends an EGD and colonoscopy. Please call us back when you can arrange for someone to drive you home from these procedures. You should be  able to only miss one day of work by taking the prep on the morning of the procedures.  ASK FOR STEPHANIE. 5)  An appointment with Dr. Craige Cotta has been scheduled for Monday, October 07, 2010 @ 10:45am.  See attached card. 6)  The medication list was reviewed and reconciled.  All changed / newly prescribed medications were explained.  A complete medication list was provided to the patient / caregiver. Prescriptions: AMITRIPTYLINE HCL 25 MG TABS (AMITRIPTYLINE HCL) 1/2 tab at bedtime x 7 nights then 1 tab at bedtime  #30 x 2   Entered and Authorized by:   Iva Boop MD, [facility]   Signed by:   Iva Boop MD, [facility] on 09/16/2010   Method used:   Electronically to        [facility] [phone]* (retail)       [address]       [place], [place]  [address]       Ph: [phone]       Fax: [phone]   RxID:   [id number]  cc: Dr. Mila Homer and Dr. Jaclynn Guarneri

## 2011-01-07 NOTE — Letter (Signed)
Summary: [facility]   Imported By: [address] 02/12/2010 11:46:55  _____________________________________________________________________  External Attachment:    Type:   Image     Comment:   External Document

## 2011-01-07 NOTE — Op Note (Signed)
Summary: [facility]  [facility]   Imported By: Lester [address] 02/12/2010 11:41:31  _____________________________________________________________________  External Attachment:    Type:   Image     Comment:   External Document

## 2011-01-07 NOTE — Assessment & Plan Note (Signed)
Summary: 5 WEEK F/U.Marland KitchenAM   History of Present Illness Visit Type: Follow-up Visit Primary GI MD: Stan Head MD [facility] Primary Provider: Robert Bellow, MD Requesting Provider: Glenna Fellows, MD Chief Complaint: F/U blockage History of Present Illness:   45 yo [place] man with chronic recurrent abdominal distention with prior bowel resection, lysis of adhesions and suspected pseudoobstructions.  He is better since taking a course ofXifaxan. Diarrhea is better. Some constipation for the past few days.He is using hydrocodone every night right now. Not much if any abdominal pain.  He is upset over a delayed appointment with Dr. Perrin Maltese. Body is sore in muscles still. Tense and anxious last few days. Some burning with urination and left testicle and was seen at [facility] - received cipro and was for follow-up. Wife and son due to arrive in next 2 weeks. Off Nexium x 2 weeks and mostly ok without much heartburn.           Current Medications (verified): 1)  Ambien 10 Mg Tabs (Zolpidem Tartrate) .... Take One By Mouth At Bedtime 2)  Hydrocodone-Acetaminophen 5-500 Mg Tabs (Hydrocodone-Acetaminophen) .... Take One By Mouth At Bedtime 3)  Hyoscyamine Sulfate 0.125 Mg Subl (Hyoscyamine Sulfate) .... Place 1-2 Tabs Under Tounge As Needed 4)  Xifaxan 550 Mg Tabs (Rifaximin) .... Take As Directed 5)  Ciprofloxacin Hcl 500 Mg Tabs (Ciprofloxacin Hcl) .... Two Times A Day  Allergies (verified): No Known Drug Allergies  Past History:  Past Medical History: Anal Fissure Small Bowel Obstructions GERD Anxiety Chronic Pain Chronic bloating and abdominal distention (IBS and pseudoobstruction)  Past Surgical History: Reviewed history from 02/05/2010 and no changes required. Segmental Colectomy 1994 [place], ? of colonic atony Laparotomy 2002,2005,2009 Stamm gastrostomy 2005 Cholecystectomy 2009 Lateral internal sphincterotomy 2009  Family History: Reviewed history from 02/05/2010 and no  changes required. Family History of Liver Disease/Cirrhosis: father No FH of Colon Cancer:  Social History: Reviewed history from 02/05/2010 and no changes required. 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  Alcohol Use - no Illicit Drug Use - no Married-Divorced- Married again 2009  - wife is in [place] with infant  son  Review of Systems       The patient complains of back pain, fatigue, muscle pains/cramps, sleeping problems, and urination changes/pain.    Vital Signs:  Patient profile:   45 year old male Height:      73 inches Weight:      188.13 pounds BMI:     24.91 Pulse rate:   72 / minute Pulse rhythm:   regular BP sitting:   110 / 70  (left arm) Cuff size:   regular  Vitals Entered By: June McMurray CMA Duncan Dull) (March 12, 2010 11:28 AM)  Physical Exam  General:  Well developed, well nourished, no acute distress. Eyes:  anicteric Neck:  supple Msk:  trapezius muscles somewhat tight to palpation   Impression & Recommendations:  Problem # 1:  DIARRHEA, RECURRENT (ICD-787.91) Assessment Improved He appears to have responded to a course of Xifaxan for suspected possible small intestinal bacterial overgrowth. Durability of the response is not yet clear. will observe without change in therapy at this time. I had considered a colonoscopy, he says he has had one though no recent records. Chroniciy and pattern of problems, combined with response to Xifaxan mean that this is not needed at this time. Could be depending upon clinical course. TTG Ab is negative  Problem # 2:  FLATULENCE ERUCTATION AND GAS  PAIN (ICD-787.3) Assessment: Improved He appears to have responded to a course of Xifaxan for suspected possible small intestinal bacterial overgrowth. Durability of the response is not yet clear. will observe without change in therapy at this time. I do beleive he has IBS and pseudoobstrution problems. TTG antibody is  negative  Problem # 3:  MUSCLE SPASM (ICD-728.85) Assessment: New upper body complaints Flexeril rxed which may reduce need for hydrocodone at bedtime ? could he have fibromyalgia  Problem # 4:  GERD (ICD-530.81) Assessment: Improved seems ok off Nexium x 2 weeks so will see if he can remain off and minimize Rx regimen he will call if he has recurrent problems and we can refill over the phone.  Problem # 5:  IRRITABLE BOWEL SYNDROME (ICD-564.1) Assessment: Improved  Patient Instructions: 1)  Please pick up your medications at your pharmacy. 2)  Please schedule a follow-up appointment in 4 months.  3)  Please schedule a follow-up appointment as needed sooner if problems. 4)  Copy sent to : Robert Bellow, MD and Heloise Purpura, MD 5)  The medication list was reviewed and reconciled.  All changed / newly prescribed medications were explained.  A complete medication list was provided to the patient / caregiver. Prescriptions: CYCLOBENZAPRINE HCL 10 MG TABS (CYCLOBENZAPRINE HCL) 1 by mouth up to three times a day as needed for muscle spasm  #60 x 1   Entered and Authorized by:   Iva Boop MD, [facility]   Signed by:   Iva Boop MD, [facility] on 03/12/2010   Method used:   Electronically to        [facility] [phone]* (retail)       [address]       [place], [place]  [address]       Ph: [phone]       Fax: [phone]   RxID:   [id number]  cc: Dr. Johna Sheriff

## 2011-01-07 NOTE — Assessment & Plan Note (Signed)
Summary: ABD DISCOMFORT...EM   History of Present Illness Visit Type: Initial Consult Primary GI MD: Stan Head MD [facility] Primary Provider: Robert Bellow, MD Requesting Provider: Glenna Fellows, MD Chief Complaint: Establish care for hospital MD History of Present Illness:   45 yo [place] man with chronic recurent abdominal pain and multiiple episodes consistent with bowel obstruction. Originally had surgery in the [place] for this, with a colonioc resection in the 1990's. He has seen multiple physiccians here, including Drs. Blase Mess and Medoff, annd is followed by Dr. Randall An and Dr. Perrin Maltese for surgical and primary care, respectively. He has numerous compaints today, and sasy in genegeral "I am not feeling good". Vision is not right despite new glasses, he has urgent defecation, loose stools - not nocturnal. There is chronic and daily bloating after meals. Ingestion of milk may but not always trigger this.  Bloating became worse after cholecystectomy.  He says he can see yellow, loose stools and some fat droplets. he can also be constipated. His las very troublesome  spell was pizza with cheese on Sunday with diarrhea He has significant insomnia and is dependent upon sleeping aids. It is not clear to me what therapies he has tried though we have extensive records that are reviewed. He has not been tried on antibiotic therapy as of yet, though it has been suggested. he has been on align and Reglan. He is awaiting the arrival of his second wife and 39 month-old son from the [place] this month. first surgery 1994 [place] - partial colectomy at ex lap   GI Review of Systems    Reports abdominal pain and  weight loss.   Weight loss of 11 pounds over about a month.   Denies acid reflux, belching, bloating, chest pain, dysphagia with liquids, dysphagia with solids, heartburn, loss of appetite, nausea, vomiting, vomiting blood, and  weight gain.      Reports constipation and  diarrhea.       Denies anal fissure, black tarry stools, change in bowel habit, diverticulosis, fecal incontinence, heme positive stool, hemorrhoids, irritable bowel syndrome, jaundice, light color stool, liver problems, rectal bleeding, and  rectal pain.  Preventive Screening-Counseling & Management  Alcohol-Tobacco     Smoking Status: never      Drug Use:  no.      Current Medications (verified): 1)  Nexium 40 Mg Cpdr (Esomeprazole Magnesium) .... Take One By Mouth Once Daily 2)  Ambien 10 Mg Tabs (Zolpidem Tartrate) .... Take One By Mouth At Bedtime 3)  Hydrocodone-Acetaminophen 5-500 Mg Tabs (Hydrocodone-Acetaminophen) .... Take One By Mouth At Bedtime 4)  Hyoscyamine Sulfate 0.125 Mg Subl (Hyoscyamine Sulfate) .... Place 1-2 Tabs Under Tounge As Needed  Allergies (verified): No Known Drug Allergies  Past History:  Past Medical History: Anal Fissure Small Bowel Obstructions GERD Anxiety Chronic Pain  Past Surgical History: Segmental Colectomy 1994 [place], ? 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  Alcohol Use - no Illicit Drug Use - no Married-Divorced- Married again 2009  - wife is in [place] 58 month old son Smoking Status:  never Drug Use:  no  Review of Systems       The patient complains of allergy/sinus, change in vision, cough, fatigue, muscle pains/cramps, sleeping problems, and sore throat.         +  eyeglasses difficulty watching computer alot and difficuly to see All other ROS negative except as per HPI.   Vital Signs:  Patient profile:   45 year old male Height:      73 inches Weight:      186.0 pounds BMI:     24.63 Pulse rate:   80 / minute Pulse rhythm:   regular BP sitting:   110 /  60  (left arm) Cuff size:   regular  Vitals Entered By: Harlow Mares CMA Duncan Dull) (February 05, 2010 11:28 AM)  Physical Exam  General:  Well developed, well nourished, no acute distress. Head:  Normocephalic and atraumatic. Eyes:  PERRLA, no icterus. Mouth:  No deformity or lesions, dentition normal. Neck:  Supple; no masses or thyromegaly. Lungs:  Clear throughout to auscultation. Heart:  Regular rate and rhythm; no murmurs, rubs,  or bruits. Abdomen:  midline scar not distended, soft and NT w/HSM, mass or hernia BS+  Extremities:  No clubbing, cyanosis, edema or deformities noted. Neurologic:  Alert and  oriented x4;  grossly normal neurologically. Skin:  Intact without significant lesions or rashes. Cervical Nodes:  No significant cervical or supraclavicular adenopathy.  Psych:  difficult to assess given broken English he uses affect seems appropriate aoverall  Old office notes, hospitalizations, op notes, xray reports and images viewed and discussed. also prior lab testing.  Impression & Recommendations:  Problem # 1:  DIARRHEA, RECURRENT (ICD-787.91) Assessment New With complaints of weight loss and recurrent obstructive problems. ? malabsorptive syndrome. Bacterial overgrowth certainlly is poosible and could cause all of this. Celiac disease is possible. Pancreatic insufficiency piossible but less likely I think.  Trial of Xifaxan to treat possible small bowel bacterial overgrowth. Note that op notes indicate he has an intact ileocecal valve so original colectomy site unclear. I cannot tell if he actually has ever had a colonoscopy and doubt he has based upon Dr. Randa Evens' last note.. I explained that a teriary evaluation with breath testing available for possible SBIO was best but he prefereed empiric trial of antibiotics first. If it works it could be diagnostic. Will clarify if he has ever had a colonoscopy. Complicated issue with many complicating factors as  described.  Orders: T-Celiac Disease Ab Evaluation (8002)  Problem # 2:  FLATULENCE ERUCTATION AND GAS PAIN (ICD-787.3) Assessment: New With complaints of weight loss and recurrent obstructive problems. ? malabsorptive syndrome. Bacterial overgrowth certainlly is poosible and could cause all of this. Celiac disease is possible. Pancreatic insufficiency piossible but less likely I think.  Trial of Xifaxan to treat possible small bowel bacterial overgrowth. Note that op notes indicate he has an intact ileocecal valve so original colectomy site unclear. I cannot tell if he actually has ever had a colonoscopy and doubt he has based upon Dr. Randa Evens' last note.. I explained that a teriary evaluation with breath testing available for possible SBIO was best but he prefereed empiric trial of antibiotics first. If it works it could be diagnostic. Will clarify if he has ever had a colonoscopy. Note that 201 SBFT showed mildly dilated loops of bowel and flocculation, 2010 MRE was negative Orders: T-Celiac Disease Ab Evaluation (8002)  Problem # 3:  INSOMNIA, CHRONIC (ICD-307.42) marker for anxiety, depression discussed with him certainly has health quality of life stresors and situational (wife and son in [place]-  coming March 2011) multiple functional and somatic complaints also a marker for this I think SSRI could be useful ultimately suspect a comprehensive evaluation likel that  available at [facility] would be best  Problem # 4:  SMALL BOWEL OBSTRUCTION, HX OF (ICD-V12.79) Though he has had adhesions in 2002, 2005 and 2009, surgeons have not been convinced that they have been caiuse of obstructions. he could have an underlying motility disorder.  Patient Instructions: 1)  Please go to the basement to have your lab tests drawn today.  2)  Please pick up your medications at your pharmacy. XIFAXAN 3)  You should take Xifaxan 1 tablet by mouth two times a day for 2  weeks.  You will have extra tablets--do not take these. 4)  We are treating you for suspected Small Intestine Bacterial Overgrowth. 5)  Please schedule a follow-up appointment in 4 to 6 weeks.  6)  Copy sent to : Robert Bellow, MD, Glenna Fellows, MD 7)  The medication list was reviewed and reconciled.  All changed / newly prescribed medications were explained.  A complete medication list was provided to the patient / caregiver. Prescriptions: XIFAXAN 550 MG TABS (RIFAXIMIN) take as directed  #60 x 0   Entered by:   Francee Piccolo CMA (AAMA)   Authorized by:   Iva Boop MD, [facility]   Signed by:   Francee Piccolo CMA (AAMA) on 02/05/2010   Method used:   Electronically to        [facility] [phone]* (retail)       [address]       [place], [place]  [address]       Ph: [phone]       Fax: [phone]   RxID:   [id number]

## 2011-01-07 NOTE — Progress Notes (Signed)
Summary: rx too expensive  Phone Note Call from Patient Call back at Home Phone [phone]   Caller: Patient Call For: Leone Payor Reason for Call: Talk to Nurse Summary of Call: Patient states that rx sent to pharmacy is too expensive wants something cheaper Initial call taken by: Tawni Levy,  February 05, 2010 1:18 PM  Follow-up for Phone Call        I spoke to pharmacist...60 pills will cost the pt $736.95, 45 will cost him $446.76, 30 pills will cost him $156.58 and we gave him a card for $100 off.  If patient is wiling to pay for 30 pills we can sample the remaining 12.  Is this OK?  Or do you want to switch to something else? Also, patient instructions state to take two times a day, I will clarify with pt that he should take three times a day.  ([facility] will have to order Xifaxan) Follow-up by: Francee Piccolo CMA Duncan Dull),  February 06, 2010 11:39 AM  Additional Follow-up for Phone Call Additional follow up Details #1::        Yes. I like your plan and it is three times a day x 14 days at 550 mg Xifaxan Additional Follow-up by: Iva Boop MD, Clementeen Graham,  February 06, 2010 12:59 PM    Additional Follow-up for Phone Call Additional follow up Details #2::    I spoke to pt.  Pt is agreeable to picking up samples and paying for 30 tablets.  I advised pt to take discount card to pharmacy, also advised pt he should take 3 times a day instead of two times a day as instructions state.  Pt voices understanding.  I spoke with Marchelle Folks at [facility] and gave verbal order to change quantity to 30.  She states they have enough to fill 30 tabs today.  Samples (12 tablets) left at front desk. Follow-up by: Francee Piccolo CMA Duncan Dull),  February 06, 2010 3:02 PM  New/Updated Medications: XIFAXAN 550 MG TABS (RIFAXIMIN) take as directed Prescriptions: XIFAXAN 550 MG TABS (RIFAXIMIN) take as directed  #30 x 0   Entered by:   Francee Piccolo CMA (AAMA)   Authorized by:   Iva Boop MD, [facility]  Signed by:   Francee Piccolo CMA (AAMA) on 02/06/2010   Method used:   Telephoned to ...       [facility] [phone]* (retail)       [address]       [place], [place]  [address]       Ph: [phone]       Fax: [phone]   RxID:   [id number]

## 2011-01-07 NOTE — Assessment & Plan Note (Signed)
Summary: cough/cb   Visit Type:  Initial Consult Copy to:  Dr. Stan Head Primary Provider/Referring Provider:  Robert Bellow, MD  CC:  Pulmonary consult...the patient c/o chemical inhalation in Sept 2011 that caused cough...cough is now gone.  History of Present Illness: 45 yo male for evaluation of cough.  He was exposed to fumes from a leaky forklift battery at work.  He noticed some burning in his throat and nose at the time.  He then developed a cough.  This happened in August, 2011.  He did not have problems with cough prior to this.  His symptoms have improved.  He was coughing clear to yellow sputum.  He denies hemoptysis, or wheeze.  He did notice some tightness in his chest.  He gets a sore throat, but this has been present for a while and predates his cough.  He gets a dry nose, but denies sinus congestion or postnasal drip.  He uses Vicks inside his nose at night to help with the dryness.  He denies any prior history of asthma, or allergies.  There is no history of pneumonia or TB.  He is from [place], but has been living in the [place] since 1999.  He denies any sick exposures, or animal exposures.  There is no history of smoking.  He works at [employer] as a Merchandiser, retail.  He denies skin rash, or joint swelling.  He has not noticed swelling in his glands.  He always feels fatigued.  He has lots of trouble with his sleep.  He snores, and his wife has told him he can stop breathing while asleep.  He has been using [place], but this does not help.  He falls asleep easily if he is sitting quiet.  His weight has been stable.   He denies any trouble with his swallow.  He is being treated for reflux by Dr. Leone Payor.  Spirometry today: FEV1 4.79(98%), FVC 3.99(103%), FEV1% 83.  This is a normal study.  CXR  Procedure date:  10/07/2010  Findings:      CHEST - 2 VIEW   Comparison: 02/24/2008   Findings: Heart size is normal.  Vascularity is normal without infiltrate or  effusion.  Lungs are clear.   IMPRESSION: No active cardiopulmonary disease.   Preventive Screening-Counseling & Management  Alcohol-Tobacco     Alcohol drinks/day: 0     Smoking Status: never  Current Medications (verified): 1)  Ambien 10 Mg Tabs (Zolpidem Tartrate) .... Take One By Mouth At Bedtime 2)  Hydrocodone-Acetaminophen 5-500 Mg Tabs (Hydrocodone-Acetaminophen) .... Take One By Mouth At Bedtime 3)  Cyclobenzaprine Hcl 10 Mg Tabs (Cyclobenzaprine Hcl) .Marland Kitchen.. 1 By Mouth Up To Three Times A Day As Needed For Muscle Spasm Hold 4)  Multivitamins  Tabs (Multiple Vitamin) .... Once Daily  Hold 5)  Nexium 40 Mg Cpdr (Esomeprazole Magnesium) .... Take 1 Capsule 30 Min Prior To Your Main Meal. 6)  Xifaxan 550 Mg Tabs (Rifaximin) .... Take One By Mouth Three Times Daily For 10 Days.  Allergies (verified): No Known Drug Allergies  Past History:  Past Medical History: Reviewed history from 03/12/2010 and no changes required. Anal Fissure Small Bowel Obstructions GERD Anxiety Chronic Pain Chronic bloating and abdominal distention (IBS and pseudoobstruction)  Past Surgical History: Reviewed history from 02/05/2010 and no changes required. Segmental Colectomy 1994 [place], ? of colonic atony Laparotomy 2002,2005,2009 Stamm gastrostomy 2005 Cholecystectomy 2009 Lateral internal sphincterotomy 2009  Family History: Reviewed history from 02/05/2010 and no changes required. Family  History of Liver Disease/Cirrhosis: father No FH of Colon Cancer:  Social History: Reviewed history from 03/12/2010 and no changes required. 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  Alcohol Use - no Illicit Drug Use - no Married-Divorced- Married again 2009  - wife is in [place] with infant  son Alcohol drinks/day:  0  Review of Systems       The patient complains of shortness of breath at rest, productive cough, coughing up blood, irregular heartbeats, acid  heartburn, abdominal pain, difficulty swallowing, sore throat, headaches, nasal congestion/difficulty breathing through nose, and joint stiffness or pain.  The patient denies shortness of breath with activity, non-productive cough, chest pain, indigestion, loss of appetite, weight change, tooth/dental problems, sneezing, itching, ear ache, anxiety, depression, hand/feet swelling, rash, change in color of mucus, and fever.    Vital Signs:  Patient profile:   45 year old male Height:      73 inches (185.42 cm) Weight:      196 pounds (89.09 kg) BMI:     25.95 O2 Sat:      93 % on Room air Temp:     97.6 degrees F (36.44 degrees C) oral Pulse rate:   72 / minute BP sitting:   112 / 70  (left arm) Cuff size:   regular  Vitals Entered By: Michel Bickers CMA (October 07, 2010 10:25 AM)  O2 Sat at Rest %:  93 O2 Flow:  Room air CC: Pulmonary consult...the patient c/o chemical inhalation in Sept 2011 that caused cough...cough is now gone Comments Medications reviewed with patient Michel Bickers CMA  October 07, 2010 10:45 AM   Physical Exam  General:  normal appearance, healthy appearing, and thin.   Eyes:  PERRLA and EOMI, wears glasses Ears:  TMs intact and clear with normal canals Nose:  no deformity, discharge, inflammation, or lesions Mouth:  MP 3, 2+ tonsills, enlarged tongue Neck:  no JVD.   Chest Wall:  no deformities noted Lungs:  clear bilaterally to auscultation and percussion Heart:  regular rate and rhythm, S1, S2 without murmurs, rubs, gallops, or clicks Abdomen:  bowel sounds positive; abdomen soft and non-tender without masses, or organomegaly Msk:  no deformity or scoliosis noted with normal posture Pulses:  pulses normal Extremities:  no clubbing, cyanosis, edema, or deformity noted Neurologic:  normal CN II-XII and strength normal.   Skin:  intact without lesions or rashes Cervical Nodes:  no significant adenopathy Psych:  alert and cooperative; normal mood and affect;  normal attention span and concentration   Impression & Recommendations:  Problem # 1:  COUGH (ICD-786.2) Possibly related to chemical exposure from work.  His symptoms have improved.  His spirometry today was normal.  There is no history of tobacco use.  His chest xray today was unremarkable.  Will monitor clinically.  Advised him to avoid using Vicks in his nose as this could place him at risk for lipoid pneumonia.  He can use as needed saline rinse for his nose.  Problem # 2:  FATIGUE (ICD-780.79)  He has sleep disruption and daytimes sleepiness.  He reports snoring, and witnessed apneas.  I am concerned that he may have sleep apnea.  To further assess this I will arrange for a home sleep test.  Will re-assess his sleep aide use after review of his home sleep study.  Problem # 3:  INSOMNIA, CHRONIC (ICD-307.42)  Some of his sleep disturbance could be related to sleep apnea instead.  Will  re-assess after review of his home sleep test.  Problem # 4:  GERD (ICD-530.81) He is followed by GI.  Complete Medication List: 1)  Ambien 10 Mg Tabs (Zolpidem tartrate) .... Take one by mouth at bedtime 2)  Hydrocodone-acetaminophen 5-500 Mg Tabs (Hydrocodone-acetaminophen) .... Take one by mouth at bedtime 3)  Cyclobenzaprine Hcl 10 Mg Tabs (Cyclobenzaprine hcl) .Marland Kitchen.. 1 by mouth up to three times a day as needed for muscle spasm hold 4)  Multivitamins Tabs (Multiple vitamin) .... Once daily  hold 5)  Nexium 40 Mg Cpdr (Esomeprazole magnesium) .... Take 1 capsule 30 min prior to your main meal. 6)  Xifaxan 550 Mg Tabs (Rifaximin) .... Take one by mouth three times daily for 10 days.  Other Orders: Spirometry w/Graph (94010) T-2 View CXR (71020TC) Consultation Level IV (50093) Sleep Disorder Referral (Sleep Disorder)  Patient Instructions: 1)  Use saline rinse for nose dryness 2)  Will schedule sleep test at home 3)  Follow up in 4 to 6 weeks

## 2011-01-07 NOTE — Assessment & Plan Note (Signed)
Summary: [name]/[name]   Copy to:  Dr. Stan Head Primary [name]/Referring [name]:  Steven Bellow, MD   History of Present Illness: Apnealink report from 10/23/10: AHI 5, RDI 6, SpO2 low 87%.  Mild sleep apnea.  Allergies: No Known Drug Allergies  Past History:  Past Medical History: Anal Fissure Small Bowel Obstructions GERD Anxiety Chronic Pain Chronic bloating and abdominal distention (IBS and pseudoobstruction) Obstructive sleep apnea      - Apne[facility]ink 10/23/10 AHI 5, SpO2 low 87%   Impression & Recommendations:  Problem # 1:  OBSTRUCTIVE SLEEP APNEA (ICD-327.23)  He has mild sleep apnea.  Will discuss further at next ROV.  Complete Medication List: 1)  Ambien 10 Mg Tabs (Zolpidem tartrate) .... Take one by mouth at bedtime 2)  Hydrocodone-acetaminophen 5-500 Mg Tabs (Hydrocodone-acetaminophen) .... Take one by mouth at bedtime 3)  Cyclobenzaprine Hcl 10 Mg Tabs (Cyclobenzaprine hcl) .Marland Kitchen.. 1 by mouth up to three times a day as needed for muscle spasm hold 4)  Multivitamins Tabs (Multiple vitamin) .... Once daily  hold 5)  Nexium 40 Mg Cpdr (Esomeprazole magnesium) .... Take 1 capsule 30 min prior to your main meal. 6)  Xifaxan 550 Mg Tabs (Rifaximin) .... Take one by mouth three times daily for 10 days.  Other Orders: Sleep Std Airflow/Heartrate and O2 SAT unattended ([address])

## 2011-01-07 NOTE — Letter (Signed)
[facility]   Imported By: Lester [place] 02/12/2010 11:32:50  _____________________________________________________________________  External Attachment:    Type:   Image     Comment:   External Document

## 2011-01-07 NOTE — Letter (Signed)
Summary: [facility]   Imported By: Lester Grafton 02/12/2010 11:44:21  _____________________________________________________________________  External Attachment:    Type:   Image     Comment:   External Document

## 2011-01-07 NOTE — Progress Notes (Signed)
Summary: Triage  Phone Note Call from Patient Call back at Home Phone [phone]   Caller: Patient Call For: Dr. Leone Payor Reason for Call: Talk to Nurse Summary of Call: Pt is taking an antibotic and does not recall the name. It is making him real weak and sick Initial call taken by: Karna Christmas,  February 11, 2010 3:07 PM  Follow-up for Phone Call        Patient  advised not a usual side effect and that he may have fatigue for another reason.  He says he will comtinue to take xifaxan. Follow-up by: Darcey Nora RN, CGRN,  February 11, 2010 3:16 PM

## 2011-01-07 NOTE — Letter (Signed)
Summary: [facility]   Imported By: Lester [place] 02/12/2010 11:39:10  _____________________________________________________________________  External Attachment:    Type:   Image     Comment:   External Document

## 2011-01-07 NOTE — Letter (Signed)
Summary: [facility]   Imported By: Lester Talpa 02/12/2010 11:45:53  _____________________________________________________________________  External Attachment:    Type:   Image     Comment:   External Document

## 2011-01-07 NOTE — Letter (Signed)
Summary: [facility]   Imported By: Lester  02/12/2010 11:37:21  _____________________________________________________________________  External Attachment:    Type:   Image     Comment:   External Document

## 2011-01-07 NOTE — Progress Notes (Signed)
Summary: Triage  Phone Note Call from Patient Call back at Home Phone [phone]   Caller: Patient Call For: Dr. Leone Payor Reason for Call: Talk to Nurse Summary of Call: pt. asked to speak directly with nurse Initial call taken by: Karna Christmas,  Apr 11, 2010 12:34 PM  Follow-up for Phone Call        Left message for patient to call back Darcey Nora RN, [facility]  Apr 11, 2010 1:31 PM  Patient would like to speak with you about his sister.  She is in intensive care in [place].  He has been told she is terminal.  He would like to ask you a qustion or 2 .  he has requested you please call him at the above phone number.   Follow-up by: Darcey Nora RN, CGRN,  Apr 11, 2010 3:02 PM  Additional Follow-up for Phone Call Additional follow up Details #1::        I spoke to him. I have agreed to review records if he obtains them. Additional Follow-up by: Iva Boop MD, [facility],  Apr 12, 2010 1:25 PM

## 2011-01-07 NOTE — Progress Notes (Signed)
Summary: Dr. Johna Sheriff requesting he be seen  Phone Note Other Incoming   Summary of Call: I was asked by Dr. Johna Sheriff to see this patient. I have asked him to send the records for me to review (Dr. Jennye Boroughs, [facility]) prior to scheduling. Did not know that there had been a request for Dr. Russella Dar earlier. Wait for records...Dr. Johna Sheriff to send. we discussed possible teriary referral Iva Boop MD, [facility]  December 17, 2009 4:44 PM    Follow-up for Phone Call        I have left a messge for medical records at [facility] as no notes have come so far.   Follow-up by: Darcey Nora RN, CGRN,  December 24, 2009 2:29 PM  Additional Follow-up for Phone Call Additional follow up Details #1::        Records are on Dr Marvell Fuller desk for review. Additional Follow-up by: Darcey Nora RN, CGRN,  December 24, 2009 2:38 PM

## 2011-01-07 NOTE — Progress Notes (Signed)
Summary: Condition Update  Phone Note Call from Patient Call back at Home Phone [phone]   Caller: Patient Call For: Dr. Leone Payor Reason for Call: Talk to Nurse Summary of Call: medication not helping (doesnt recall med name)... still a lot of abd pain and coughing at night Initial call taken by: Vallarie Mare,  September 04, 2010 1:38 PM  Follow-up for Phone Call        Follow-up from triage on 08-22-10:  Pt. continues with same symptoms-Epigastric pain, heartburn, cough is somewhat improved, constipation and foul smelling stools.  Pt. completed the Flagyl and continues Nexium once daily. Pt. has a f/u appt. on 09-16-10.  DR.GESSNER PLEASE ADVISE  Follow-up by: Laureen Ochs LPN,  September 04, 2010 2:10 PM  Additional Follow-up for Phone Call Additional follow up Details #1::        If he could buy Xifaxain 550 mg three times a day x 10 days it would help as it did before.  If not, try Augmentin 875 mg two times a day x 10 days.  He should take MiraLax daily if constipated.  I am not going to be able to help his cough, I suspect Additional Follow-up by: Iva Boop MD, [facility] Graham,  September 04, 2010 4:34 PM    Additional Follow-up for Phone Call Additional follow up Details #2::    Message left for patient to callback. Laureen Ochs LPN  September 04, 2010 4:45 PM  Message left for patient to callback. Laureen Ochs LPN  September 05, 2010 9:23 AM   Additional Follow-up for Phone Call Additional follow up Details #3:: Details for Additional Follow-up Action Taken: Above MD orders reviewed with patient. I will send both scripts to his pharmacy, he will take whichever one he can afford. Pt. to keep scheduled office visit 09-16-10. Pt. instructed to call back as needed.  Additional Follow-up by: Laureen Ochs LPN,  September 05, 2010 2:37 PM  New/Updated Medications: XIFAXAN 550 MG TABS (RIFAXIMIN) Take one by mouth three times daily for 10 days. AUGMENTIN 875-125  MG TABS (AMOXICILLIN-POT CLAVULANATE) Take one by mouth two times daily for 10 days. Prescriptions: AUGMENTIN 875-125 MG TABS (AMOXICILLIN-POT CLAVULANATE) Take one by mouth two times daily for 10 days.  #20 x 0   Entered by:   Laureen Ochs LPN   Authorized by:   Iva Boop MD, [facility]   Signed by:   Laureen Ochs LPN on 18/84/1660   Method used:   Electronically to        [facility] [phone]* (retail)       [address]       [place], [place]  [address]       Ph: [phone]       Fax: [phone]   RxID:   [id number] XIFAXAN 550 MG TABS (RIFAXIMIN) Take one by mouth three times daily for 10 days.  #30 x 0   Entered by:   Laureen Ochs LPN   Authorized by:   Iva Boop MD, [facility]   Signed by:   Laureen Ochs LPN on 62/69/4854   Method used:   Electronically to        [facility] [phone]* (retail)       [address]       [place], [place]  [address]       Ph: [phone]  Fax: [phone]   RxID:   [id number]

## 2011-01-09 NOTE — Assessment & Plan Note (Signed)
Summary: 4-6 week return/mh   Visit Type:  Follow-up Copy to:  Dr. Stan Head Primary Provider/Referring Provider:  Robert Bellow, MD  CC:  Patient here for follow-up.Marland KitchenMarland Kitchenpt here to discuss sleep test results...needs refill for Ambien.  History of Present Illness: 45 yo male with mild sleep apnea.  Apnealink report from 10/23/10: AHI 5, RDI 6, SpO2 low 87%.  Mild sleep apnea.    Current Medications (verified): 1)  Ambien 10 Mg Tabs (Zolpidem Tartrate) .... Take One By Mouth At Bedtime  Out 2)  Hydrocodone-Acetaminophen 5-500 Mg Tabs (Hydrocodone-Acetaminophen) .... Take One By Mouth At Bedtime 3)  Cyclobenzaprine Hcl 10 Mg Tabs (Cyclobenzaprine Hcl) .Marland Kitchen.. 1 By Mouth Up To Three Times A Day As Needed For Muscle Spasm 4)  Multivitamins  Tabs (Multiple Vitamin) .... Once Daily As Needed 5)  Nexium 40 Mg Cpdr (Esomeprazole Magnesium) .... Take 1 Capsule 30 Min Prior To Your Main Meal.  Allergies (verified): No Known Drug Allergies  Past History:  Past Medical History: Reviewed history from 10/23/2010 and no changes required. Anal Fissure Small Bowel Obstructions GERD Anxiety Chronic Pain Chronic bloating and abdominal distention (IBS and pseudoobstruction) Obstructive sleep apnea      - Apnealink 10/23/10 AHI 5, SpO2 low 87%  Past Surgical History: Reviewed history from 02/05/2010 and no changes required. Segmental Colectomy 1994 [place], ? of colonic atony Laparotomy 2002,2005,2009 Stamm gastrostomy 2005 Cholecystectomy 2009 Lateral internal sphincterotomy 2009  Family History: Reviewed history from 02/05/2010 and no changes required. Family History of Liver Disease/Cirrhosis: father No FH of Colon Cancer:  Social History: Reviewed history from 03/12/2010 and no changes required. 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  Alcohol Use - no Illicit Drug Use - no Married-Divorced- Married again 2009  - wife is in [place] with infant   son  Vital Signs:  Patient profile:   45 year old male Height:      73 inches (185.42 cm) Weight:      195 pounds (88.64 kg) BMI:     25.82 O2 Sat:      99 % on Room air Temp:     98.2 degrees F (36.78 degrees C) oral Pulse rate:   79 / minute BP sitting:   112 / 78  (right arm) Cuff size:   regular  Vitals Entered By: Michel Bickers CMA (November 11, 2010 10:53 AM)  O2 Sat at Rest %:  99 O2 Flow:  Room air CC: Patient here for follow-up.Marland KitchenMarland Kitchenpt here to discuss sleep test results...needs refill for Ambien Comments Medications reviewed with patient Michel Bickers CMA  November 11, 2010 11:01 AM   Physical Exam  General:  normal appearance, healthy appearing, and thin.   Nose:  no deformity, discharge, inflammation, or lesions Mouth:  MP 3, 2+ tonsills, enlarged tongue Neck:  no JVD.   Lungs:  clear bilaterally to auscultation and percussion Heart:  regular rate and rhythm, S1, S2 without murmurs, rubs, gallops, or clicks Extremities:  no clubbing, cyanosis, edema, or deformity noted Neurologic:  normal CN II-XII and strength normal.   Cervical Nodes:  no significant adenopathy Psych:  alert and cooperative; normal mood and affect; normal attention span and concentration   Impression & Recommendations:  Problem # 1:  OBSTRUCTIVE SLEEP APNEA (ICD-327.23) Reviewed his sleep test with him.  Discussed the diagnosis of sleep apnea, and how this can affect his health.  Driving precautions and importance of maintaining his weight were reviewed.  Treatment options were discussed.  He would like to investigate the options of CPAP versus oral appliance.  I will arrange for an auto-CPAP titration at home.  In the meantime he will arrange for dental evaluation to determine if he is a candidate for mandibular advancement device.  I have advised him to contact me if he opts for an oral appliance.  Also explained that he would need further sleep testing if he chooses an oral appliance.  Problem #  2:  COUGH (ICD-786.2) I suspect much of his nocturnal "cough" is related to sleep disordered breathing.  Will re-assess after he is treated for sleep apnea.  Problem # 3:  HEADACHE (ICD-784.0) Explained that headache symptoms can occur with sleep disruption from sleep apnea.  Will monitor his response to therapy for OSA.  Medications Added to Medication List This Visit: 1)  Ambien 10 Mg Tabs (Zolpidem tartrate) .... Take one by mouth at bedtime  out 2)  Cyclobenzaprine Hcl 10 Mg Tabs (Cyclobenzaprine hcl) .Marland Kitchen.. 1 by mouth up to three times a day as needed for muscle spasm 3)  Multivitamins Tabs (Multiple vitamin) .... Once daily as needed 4)  Mandibular Advancement Device  .... For treatement of mild obstructive sleep apnea (327.23)  Complete Medication List: 1)  Ambien 10 Mg Tabs (Zolpidem tartrate) .... Take one by mouth at bedtime  out 2)  Hydrocodone-acetaminophen 5-500 Mg Tabs (Hydrocodone-acetaminophen) .... Take one by mouth at bedtime 3)  Cyclobenzaprine Hcl 10 Mg Tabs (Cyclobenzaprine hcl) .Marland Kitchen.. 1 by mouth up to three times a day as needed for muscle spasm 4)  Multivitamins Tabs (Multiple vitamin) .... Once daily as needed 5)  Nexium 40 Mg Cpdr (Esomeprazole magnesium) .... Take 1 capsule 30 min prior to your main meal. 6)  Mandibular Advancement Device  .... For treatement of mild obstructive sleep apnea (327.23)  Other Orders: Est. Patient Level III (60454) DME Referral (DME)  Patient Instructions: 1)  Will arrange for CPAP machine at home 2)  Talk to your dentist about oral appliance (mandibular advancement device) for treating mild sleep apnea 3)  Follow up in 6 to 8 weeks Prescriptions: MANDIBULAR ADVANCEMENT DEVICE For treatement of mild obstructive sleep apnea (327.23)  #0 x 0   Entered and Authorized by:   Coralyn Helling MD   Signed by:   Coralyn Helling MD on 11/11/2010   Method used:   Print then Give to Patient   RxID:   [id number] AMBIEN 10 MG TABS (ZOLPIDEM  TARTRATE) take one by mouth at bedtime  OUT  #30 x 2   Entered and Authorized by:   Coralyn Helling MD   Signed by:   Coralyn Helling MD on 11/11/2010   Method used:   Print then Give to Patient   RxID:   [id number]    Immunization History:  Influenza Immunization History:    Influenza:  historical (09/07/2010)

## 2011-01-09 NOTE — Progress Notes (Signed)
Summary: breathing issues > flonase and nasal irrigation  Phone Note Call from Patient Call back at Home Phone [phone]   Caller: Patient Call For: sood Summary of Call: Pt states that he couldn't breathe out of his nose all night, he had to breathe with his mouth open pls advise.//[facility] [address] Initial call taken by: Darletta Moll,  November 20, 2010 2:34 PM  Follow-up for Phone Call        called spoke with patient who states had difficulty breathing last night and had to sleep with his mouth open.  states nose feels very dry.  some PND.  denies sinus pressure/congestion.  states has been using the saline rinse as recommended by VS but this did not help.  please advise, thanks! Boone Master CNA/MA  November 20, 2010 4:12 PM   Additional Follow-up for Phone Call Additional follow up Details #1::        Advise him to continue nasal irrigation, and call script for flonase two sprays each nostril once daily for 3 weeks, then as needed.  Give one bottle with 3 refills. Additional Follow-up by: Coralyn Helling MD,  November 20, 2010 4:17 PM    Additional Follow-up for Phone Call Additional follow up Details #2::    LMTCB.Reynaldo Minium CMA  November 20, 2010 4:28 PM   called spoke with patient, advised of VS's recs as stated above.  pt okay with this and verbalized his understanding.  rx sent kto verified pharmacy. Boone Master CNA/MA  November 20, 2010 4:30 PM   New/Updated Medications: FLUTICASONE PROPIONATE 50 MCG/ACT SUSP (FLUTICASONE PROPIONATE) two sprays each nostril once daily for 3 weeks, then as needed Prescriptions: FLUTICASONE PROPIONATE 50 MCG/ACT SUSP (FLUTICASONE PROPIONATE) two sprays each nostril once daily for 3 weeks, then as needed  #1 x 3   Entered by:   Boone Master CNA/MA   Authorized by:   Coralyn Helling MD   Signed by:   Boone Master CNA/MA on 11/20/2010   Method used:   Electronically to        [facility]. [phone]* (retail)       [address]       [place], [place]  [address]       Ph: [phone]       Fax: [phone]   RxID:   [id number]

## 2011-01-09 NOTE — Progress Notes (Signed)
Summary: sleep study  Phone Note From Other Clinic   Caller: Mardella Layman w/ [facility] Call For: sood Summary of Call: caller called sleep center and they didn't see where pt has had a sleep study. lindsey wanted a copy of this. [phone] Initial call taken by: Tivis Ringer, CNA,  November 25, 2010 3:14 PM  Follow-up for Phone Call        LMTCBx1  with Mardella Layman. Looks like the pt had an apnea link. Is this what lindsey wants? Carron Curie CMA  November 25, 2010 4:25 PM  Mardella Layman returned call.  She requesting if we had a sleep study on the pt.  Advised per our records, pt had an apnea link.  States she already has these results -- nothing further needed from our office at this point Follow-up by: Gweneth Dimitri RN,  November 25, 2010 4:45 PM

## 2011-01-12 ENCOUNTER — Emergency Department (HOSPITAL_COMMUNITY): Payer: BC Managed Care – PPO

## 2011-01-12 ENCOUNTER — Emergency Department (HOSPITAL_COMMUNITY)
Admission: EM | Admit: 2011-01-12 | Discharge: 2011-01-13 | Disposition: A | Discharge status: Home / Self Care | Payer: BC Managed Care – PPO | Attending: Emergency Medicine | Admitting: Emergency Medicine

## 2011-01-12 DIAGNOSIS — R0789 Other chest pain: Secondary | ICD-10-CM | POA: Insufficient documentation

## 2011-01-12 DIAGNOSIS — G4730 Sleep apnea, unspecified: Secondary | ICD-10-CM | POA: Insufficient documentation

## 2011-01-12 LAB — COMPREHENSIVE METABOLIC PANEL
ALT: 25 U/L (ref 0–53)
AST: 22 U/L (ref 0–37)
Albumin: 4.2 g/dL (ref 3.5–5.2)
Alkaline Phosphatase: 65 U/L (ref 39–117)
BUN: 8 mg/dL (ref 6–23)
Chloride: 103 mEq/L (ref 96–112)
GFR calc Af Amer: >60 mL/min (ref >60)
Potassium: 3.7 mEq/L (ref 3.5–5.1)
Sodium: 141 mEq/L (ref 135–145)
Total Bilirubin: 0.8 mg/dL (ref 0.3–1.2)
Total Protein: 7.4 g/dL (ref 6.0–8.3)

## 2011-01-12 LAB — CBC
MCV: 83.6 fL (ref 78.0–100.0)
Platelets: 213 10*3/uL (ref 150–400)
RBC: 4.95 MIL/uL (ref 4.22–5.81)
RDW: 12.7 % (ref 11.5–15.5)
WBC: 4.5 10*3/uL (ref 4.0–10.5)

## 2011-01-12 LAB — DIFFERENTIAL
Basophils Absolute: 0 10*3/uL (ref 0.0–0.1)
Eosinophils Absolute: 0.1 10*3/uL (ref 0.0–0.7)
Lymphs Abs: 2.1 10*3/uL (ref 0.7–4.0)
Neutrophils Relative %: 43 % (ref 43–77)

## 2011-01-12 LAB — POCT CARDIAC MARKERS
CKMB, poc: <1 ng/mL — ABNORMAL LOW (ref 1.0–8.0)
CKMB, poc: <1 ng/mL — ABNORMAL LOW (ref 1.0–8.0)
Myoglobin, poc: 52 ng/mL (ref 12–200)
Troponin i, poc: <0.05 ng/mL (ref 0.00–0.09)
Troponin i, poc: <0.05 ng/mL (ref 0.00–0.09)

## 2011-01-16 DIAGNOSIS — Z9189 Other specified personal risk factors, not elsewhere classified: Secondary | ICD-10-CM | POA: Insufficient documentation

## 2011-01-16 DIAGNOSIS — R42 Dizziness and giddiness: Secondary | ICD-10-CM | POA: Insufficient documentation

## 2011-01-24 ENCOUNTER — Ambulatory Visit: Payer: BC Managed Care – PPO | Admitting: Cardiology

## 2011-01-29 ENCOUNTER — Ambulatory Visit: Payer: BC Managed Care – PPO | Admitting: Cardiology

## 2011-01-31 ENCOUNTER — Telehealth (INDEPENDENT_AMBULATORY_CARE_PROVIDER_SITE_OTHER): Payer: Self-pay | Admitting: *Deleted

## 2011-02-05 ENCOUNTER — Encounter: Payer: Self-pay | Admitting: Cardiovascular Disease

## 2011-02-05 ENCOUNTER — Encounter: Payer: Self-pay | Admitting: Pulmonary Disease

## 2011-02-05 ENCOUNTER — Ambulatory Visit (INDEPENDENT_AMBULATORY_CARE_PROVIDER_SITE_OTHER): Payer: BC Managed Care – PPO | Admitting: Cardiovascular Disease

## 2011-02-05 DIAGNOSIS — R072 Precordial pain: Secondary | ICD-10-CM

## 2011-02-06 ENCOUNTER — Ambulatory Visit (INDEPENDENT_AMBULATORY_CARE_PROVIDER_SITE_OTHER): Payer: BC Managed Care – PPO | Admitting: Adult Health

## 2011-02-06 ENCOUNTER — Encounter: Payer: Self-pay | Admitting: Pulmonary Disease

## 2011-02-06 ENCOUNTER — Encounter: Payer: Self-pay | Admitting: Adult Health

## 2011-02-06 DIAGNOSIS — R0602 Shortness of breath: Secondary | ICD-10-CM

## 2011-02-06 DIAGNOSIS — J309 Allergic rhinitis, unspecified: Secondary | ICD-10-CM

## 2011-02-06 DIAGNOSIS — G4733 Obstructive sleep apnea (adult) (pediatric): Secondary | ICD-10-CM

## 2011-02-07 DIAGNOSIS — J309 Allergic rhinitis, unspecified: Secondary | ICD-10-CM | POA: Insufficient documentation

## 2011-02-07 DIAGNOSIS — R0602 Shortness of breath: Secondary | ICD-10-CM | POA: Insufficient documentation

## 2011-02-13 NOTE — Progress Notes (Signed)
Summary: SOB-lmtcb x 2  Phone Note Call from Patient   Caller: Patient Call For: SOOD Summary of Call: Patient phoned stated that he is having difficultly breathing and feeling bad he wants to be seen and Dr Craige Cotta doesnt have anything. patient would like to be worked in he can be reached at [phone] Initial call taken by: Vedia Coffer,  January 31, 2011 2:22 PM  Follow-up for Phone Call        TP has openings LMOMTCB Vernie Murders  January 31, 2011 2:37 PM Patient called back stated that he was returning a call I offered him an appt with TammyP but he stated that he would rather see Dr Craige Cotta and if possible on Monday. Patient can be reached at [phone].Vedia Coffer  January 31, 2011 3:05 PM   Additional Follow-up for Phone Call Additional follow up Details #1::        VS first availble not until 02/24/11- if SOB needs ov asap for eval LMOMTCB Vernie Murders  January 31, 2011 3:20 PM lmomtcb x 2 Carver Fila  February 03, 2011 5:02 PM  Patient called back stated that was returninga a call  he can be reached at [phone].Vedia Coffer  February 04, 2011 2:23 PM     Additional Follow-up for Phone Call Additional follow up Details #2::    Spoke with pt.  He is c/o increased SOB at night x 2 wks.  I advised needs ov and offered appt with TP for tommorrow, but he refused stating that he can not come in due to having stress test tommorrow.  Appt sched with TP for 3/1 at 11:15 am. I advised ED sooner if needed. Follow-up by: Vernie Murders,  February 04, 2011 2:37 PM

## 2011-02-13 NOTE — Assessment & Plan Note (Signed)
Summary: Acute NP office visit - dyspnea   Copy to:  Dr. Stan Head Primary Provider/Referring Provider:  Robert Bellow, MD  CC:  increased SOB, tightness in chest esp w/ lying down, difficulty sleeping, HA x2weeks - denies wheezing, and cough.  History of Present Illness: 45 yo male with mild sleep apnea.  February 06, 2011 --Pt returns with multiple complaints. Says he can not wear his CPAP , takes mask off during sleep, he has trouble sleeping despite taking ambien. Has chronic pain on pain meds .Still has dyspnea x 5 months since exposure to exhaust fumes. CXR and spirometry previous were unremarkable. Has seen cardiology and has upcoming stress test to examine chest pain/tightness.   Has alot of post nasal drip, nasal drainage, stuffiness at times, eye waters intermittenly. No wheezing or cough. NO discolored mucus, sinus pain or pressure. Denies chest pain, orthopnea, hemoptysis, fever, n/v/d, edema, headache.   Medications Prior to Update: 1)  Hydrocodone-Acetaminophen 5-500 Mg Tabs (Hydrocodone-Acetaminophen) .... Take One By Mouth At Bedtime 2)  Cyclobenzaprine Hcl 10 Mg Tabs (Cyclobenzaprine Hcl) .Marland Kitchen.. 1 By Mouth Up To Three Times A Day As Needed For Muscle Spasm 3)  Ambien 10 Mg Tabs (Zolpidem Tartrate) .... Take One By Mouth At Bedtime  Out 4)  Fluticasone Propionate 50 Mcg/act Susp (Fluticasone Propionate) .... Two Sprays Each Nostril Once Daily For 3 Weeks, Then As Needed 5)  Nexium 40 Mg Cpdr (Esomeprazole Magnesium) .... As Needed 6)  Mandibular Advancement Device .... For Treatement of Mild Obstructive Sleep Apnea (327.23) On Hold  Current Medications (verified): 1)  Ambien 10 Mg Tabs (Zolpidem Tartrate) .... Take One By Mouth At Bedtime  Out 2)  Norco 5-325 Mg Tabs (Hydrocodone-Acetaminophen) .Marland Kitchen.. 1-2 Tabs By Mouth Every 4-6 Hours As Needed Pain 3)  Cyclobenzaprine Hcl 10 Mg Tabs (Cyclobenzaprine Hcl) .Marland Kitchen.. 1 By Mouth Up To Three Times A Day As Needed For Muscle Spasm 4)   Nexium 40 Mg Cpdr (Esomeprazole Magnesium) .... As Needed 5)  Mandibular Advancement Device .... For Treatement of Mild Obstructive Sleep Apnea (327.23) On Hold 6)  Fluticasone Propionate 50 Mcg/act Susp (Fluticasone Propionate) .... Two Sprays Each Nostril Once Daily For 3 Weeks, Then As Needed 7)  Mobic 15 Mg Tabs (Meloxicam) .... Take 1 Tablet By Mouth Once A Day  Allergies (verified): No Known Drug Allergies  Past History:  Past Medical History: Last updated: 01/16/2011 POSTURAL LIGHTHEADEDNESS (ICD-780.4) CHEST WALL PAIN, HX OF (ICD-V15.89) OBSTRUCTIVE SLEEP APNEA (ICD-327.23)- Apnealink 10/23/10 AHI 5, SpO2 low 87% HEADACHE (ICD-784.0) IRRITABLE BOWEL SYNDROME (ICD-564.1) GERD (ICD-530.81) SMALL BOWEL OBSTRUCTION, HX OF (ICD-V12.79) FLATULENCE ERUCTATION AND GAS PAIN (ICD-787.3) DIARRHEA, RECURRENT (ICD-787.91) COUGH (ICD-786.2)          Past Surgical History: Last updated: 02/05/2010 Segmental Colectomy 1994 [place], ? 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  Alcohol Use - no Illicit Drug Use - no Married-Divorced- Married again 2009  - wife is in [place] with infant  son  Risk Factors: Smoking Status: never (10/07/2010)  Review of Systems      See HPI  Vital Signs:  Patient profile:   45 year old male Height:      73 inches Weight:      194.13 pounds BMI:     25.70  O2 Sat:      97 % on Room air Temp:     96.8 degrees F oral Pulse rate:   74 / minute BP sitting:   104 / 60  (left arm) Cuff size:   regular  Vitals Entered By: Boone Master CNA/MA (February 06, 2011 11:29 AM)  O2 Flow:  Room air CC: increased SOB, tightness in chest esp  w/ lying down, difficulty sleeping, HA x2weeks - denies wheezing, cough Is Patient Diabetic? No Comments Medications reviewed with patient Daytime contact number verified with patient. Boone Master CNA/MA  February 06, 2011 11:29 AM    Physical Exam  Additional Exam:  GEN: A/Ox3; pleasant , NAD HEENT:  [place]/AT, , EACs-clear, TMs-wnl, NOSE-clear drainage, nontender sinus , THROAT-clear NECK:  Supple w/ fair ROM; no JVD; normal carotid impulses w/o bruits; no thyromegaly or nodules palpated; no lymphadenopathy. RESP  Clear to P & A; w/o, wheezes/ rales/ or rhonchi. CARD:  RRR, no m/r/g   GI:   Soft & nt; nml bowel sounds; no organomegaly or masses detected. Musco: Warm bil,  no calf tenderness edema, clubbing, pulses intact Neuro: intact w/ no focal deficits noted    Impression & Recommendations:  Problem # 1:  ALLERGIC RHINITIS (ICD-477.9)  AR flare will treat for possible rhinitis that could be triggering airway  Plan :  Begin claritin 10mg  once daily  Saline nasal rinse two times a day  Increase Fluticasone nasal 2 puffs qam  Add Patanase 2 pufs at bedtime until sample is gone   Orders: Est. Patient Level IV (16109)  Problem # 2:  OBSTRUCTIVE SLEEP APNEA (ICD-327.23)  follow up Dr. Craige Cotta regarding OSA and CPAP mask suggestions given, can look at different apparatus if he would like, to call back also made suggestion regarding meds re -ambien , narcotics, muscle relaxers - overmedication may be exacerbatiing fatigue daytime sleepiness and interuppting sleep as well advised on sleep hygiene and stress reducers   Orders: Est. Patient Level IV (60454)  Problem # 3:  DYSPNEA (ICD-786.05)  ? etiology  will set up for full PFTs on return -look for possible asthma component  prev spiro nml. cxr nml  cont to follow tx trigger of rhinitis   Orders: Est. Patient Level IV (09811)  Medications Added to Medication List This Visit: 1)  Mobic 15 Mg Tabs (Meloxicam) .... Take 1  tablet by mouth once a day 2)  Nexium 40 Mg Cpdr (Esomeprazole magnesium) .Marland Kitchen.. 1 by mouth once daily as needed 3)  Norco 5-325 Mg Tabs (Hydrocodone-acetaminophen) .Marland Kitchen.. 1-2 tabs by mouth every 4-6 hours as needed pain  Patient Instructions: 1)  Begin claritin 10mg  once daily  2)  Saline nasal rinse two times a day  3)  Increase Fluticasone nasal 2 puffs qam  4)  Add Patanase 2 pufs at bedtime until sample is gone  5)  We are setting you up for PFTs-lung function test.  6)  follow up Dr. Craige Cotta in 4 weeks  7)  Please contact office for sooner follow up if symptoms do not improve or worsen

## 2011-02-18 NOTE — Miscellaneous (Signed)
Summary: D/C CPAP/[facility]  D/C CPAP/[facility]   Imported By: Sherian Rein 02/14/2011 15:00:47  _____________________________________________________________________  External Attachment:    Type:   Image     Comment:   External Document

## 2011-02-18 NOTE — Letter (Signed)
Summary: CMN for CPAP Supplies/[facility]  CMN for CPAP Supplies/[facility]   Imported By: Sherian Rein 02/14/2011 10:58:57  _____________________________________________________________________  External Attachment:    Type:   Image     Comment:   External Document

## 2011-02-25 NOTE — Assessment & Plan Note (Signed)
Summary: np6/chest pain/pt seen in ER for chest pain   Visit Type:  Initial Consult Primary Provider:  Robert Bellow, MD  CC:  Chest pains.  History of Present Illness: 45 year-old gentleman presents for initial evaluation of chest pain. He was evaluated in the emergency department 2 weeks ago when he presented with chest pain. He characterizes the pain as a stabbing sensation. The pain is intermittent, and seems to be worse at night. There is no exertional component.   Chest pain is located on left side of chest, described as a soreness. He also feels a 'breathing problem' at nighttime. He has mild obstructive sleep apnea but is intolerant to CPAP.   He notes very little physical activity. He works at a [employer] and does not participate in exercise.   Current Medications (verified): 1)  Ambien 10 Mg Tabs (Zolpidem Tartrate) .... Take One By Mouth At Bedtime  Out 2)  Hydrocodone-Acetaminophen 5-500 Mg Tabs (Hydrocodone-Acetaminophen) .... Take One By Mouth At Bedtime 3)  Cyclobenzaprine Hcl 10 Mg Tabs (Cyclobenzaprine Hcl) .Marland Kitchen.. 1 By Mouth Up To Three Times A Day As Needed For Muscle Spasm 4)  Nexium 40 Mg Cpdr (Esomeprazole Magnesium) .... As Needed 5)  Mandibular Advancement Device .... For Treatement of Mild Obstructive Sleep Apnea (327.23) On Hold 6)  Fluticasone Propionate 50 Mcg/act Susp (Fluticasone Propionate) .... Two Sprays Each Nostril Once Daily For 3 Weeks, Then As Needed  Allergies (verified): No Known Drug Allergies  Past History:  Past medical, surgical, family and social histories (including risk factors) reviewed, and no changes noted (except as noted below).  Past Medical History: Reviewed history from 01/16/2011 and no changes required. POSTURAL LIGHTHEADEDNESS (ICD-780.4) CHEST WALL PAIN, HX OF (ICD-V15.89) OBSTRUCTIVE SLEEP APNEA (ICD-327.23)- Apnealink 10/23/10 AHI 5, SpO2 low 87% HEADACHE (ICD-784.0) IRRITABLE BOWEL SYNDROME (ICD-564.1) GERD  (ICD-530.81) SMALL BOWEL OBSTRUCTION, HX OF (ICD-V12.79) FLATULENCE ERUCTATION AND GAS PAIN (ICD-787.3) DIARRHEA, RECURRENT (ICD-787.91) COUGH (ICD-786.2)          Past Surgical History: Reviewed history from 02/05/2010 and no changes required. Segmental Colectomy 1994 [place], ? of colonic atony Laparotomy 2002,2005,2009 Stamm gastrostomy 2005 Cholecystectomy 2009 Lateral internal sphincterotomy 2009  Family History: Reviewed history from 02/05/2010 and no changes required. Family History of Liver Disease/Cirrhosis: father No FH of Colon Cancer:  Social History: Reviewed history from 03/12/2010 and no changes required. 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  Alcohol Use - no Illicit Drug Use - no Married-Divorced- Married again 2009  - wife is in [place] with infant  son  Review of Systems       Negative except as per HPI   Vital Signs:  Patient profile:   45 year old male Height:      73 inches Weight:      193 pounds BMI:     25.56 Pulse rate:   74 / minute Pulse rhythm:   regular Resp:     18 per minute BP sitting:   108 / 80  (left arm) Cuff size:   regular  Vitals Entered By: Vikki Ports (February 05, 2011 11:40 AM)  Physical Exam  General:  Pt is alert and oriented, in no acute distress. HEENT: normal Neck: normal carotid upstrokes without bruits, JVP normal Lungs: CTA CV: RRR without murmur or gallop Abd: soft, NT, positive BS, no bruit, no organomegaly Ext: no clubbing, cyanosis, or edema. peripheral pulses 2+ and equal Skin: warm and dry without rash    EKG  Procedure date:  02/05/2011  Findings:      NSR 74 bpm, RBBB.  Impression & Recommendations:  Problem # 1:  CHEST PAIN, PRECORDIAL (ICD-786.51) This is a 44 year old gentleman with atypical chest pain. He has a right bundle branch block on EKG with isoelectric ST segments. He has a paucity of cardiovascular risk factors.  Recommend an exercise  treadmill stress test for further evaluation. I suspect his pain is musculoskeletal in nature.  Orders: EKG w/ Interpretation (93000) Treadmill (Treadmill)  Patient Instructions: 1)  Your physician has requested that you have an exercise tolerance test.  For further information please visit [email or web address]  Please also follow instruction sheet, as given. 2)  Your physician recommends that you continue on your current medications as directed. Please refer to the Current Medication list given to you today.

## 2011-03-01 ENCOUNTER — Encounter: Payer: Self-pay | Admitting: Cardiovascular Disease

## 2011-03-06 ENCOUNTER — Encounter: Payer: Self-pay | Admitting: Pulmonary Disease

## 2011-03-06 ENCOUNTER — Ambulatory Visit (INDEPENDENT_AMBULATORY_CARE_PROVIDER_SITE_OTHER): Payer: BC Managed Care – PPO | Admitting: Pulmonary Disease

## 2011-03-06 VITALS — BP 130/80 | HR 79 | Temp 98.5°F | Ht 72.0 in | Wt 190.0 lb

## 2011-03-06 DIAGNOSIS — R05 Cough: Secondary | ICD-10-CM

## 2011-03-06 DIAGNOSIS — G4733 Obstructive sleep apnea (adult) (pediatric): Secondary | ICD-10-CM

## 2011-03-06 DIAGNOSIS — J309 Allergic rhinitis, unspecified: Secondary | ICD-10-CM

## 2011-03-06 DIAGNOSIS — R0602 Shortness of breath: Secondary | ICD-10-CM

## 2011-03-06 DIAGNOSIS — R059 Cough, unspecified: Secondary | ICD-10-CM

## 2011-03-06 LAB — PULMONARY FUNCTION TEST

## 2011-03-06 MED ORDER — FLUTICASONE PROPIONATE 50 MCG/ACT NA SUSP: 2.0000 | Freq: Every day | NASAL | Status: DC

## 2011-03-06 MED ORDER — MONTELUKAST SODIUM 10 MG PO TABS: ORAL_TABLET | ORAL | Status: DC

## 2011-03-06 NOTE — Assessment & Plan Note (Signed)
He is to continue with flonase and loratadine.  I will give him a trial of singulair.  Advised him to use nasal irrigation also.

## 2011-03-06 NOTE — Assessment & Plan Note (Signed)
He has mild sleep apnea.  He has been intolerant of CPAP.  Will arrange for dental evaluation to assess for oral appliance.

## 2011-03-06 NOTE — Progress Notes (Signed)
Subjective:    Patient ID: [name], male    DOB: 11-02-1966, 45 y.o.   MRN: [id number]  HPI 45 yo male never smoker with hx of OSA, allergic rhinitis, and dyspnea.  He has not been able to use CPAP, and has stopped using this.  He continues to have trouble with his sleep, snoring, choking sensation at night, and sleepiness during the day.  He continues to have sinus drainage.  He gets post-nasal drip, and this causes a cough with clear sputum.  He occasionally gets chest tightness, but denies fever or wheeze.  He does not feel like his breathing limits his activity.  He is scheduled to have a stress test next week.  CXR 01/12/11 was normal.  Pulmonary function testing today was normal.  Past Medical History  Diagnosis Date  . Postural lightheadedness   . Chest wall pain   . Obstructive sleep apnea     apnealink 10/23/10 AHI 5, Sp02 low 87%  . Headache   . Irritable bowel syndrome   . GERD (gastroesophageal reflux disease)   . Small bowel obstruction   . Flatulence, eructation, and gas pain   . Diarrhea recurrent  . Cough      Family History  Problem Relation Age of Onset  . Cirrhosis Father      History   Social History  . Marital Status: Married    Spouse Name: N/A    Number of Children: N/A  . Years of Education: N/A   Occupational History  . Not on file.   Social History Main Topics  . Smoking status: Never Smoker   . Smokeless tobacco: Not on file  . Alcohol Use: No  . Drug Use: No  . Sexually Active: Not on file   Other Topics Concern  . Not on file   Social History Narrative   Occupation: Group Engineer, petroleum (deliveries)Patient has never smoked.Alcohol Use-noIllicit Drug Use-noMarried-Divorced-Married again 2009- wife is in [place] with infant son     No Known Allergies   Outpatient Prescriptions Prior to Visit  Medication Sig Dispense Refill  . esomeprazole (NEXIUM) 40 MG capsule Take 40 mg by mouth daily before breakfast. As needed        . HYDROcodone-acetaminophen (NORCO) 5-325 MG per tablet Take 1 tablet by mouth every 6 (six) hours as needed.        . zolpidem (AMBIEN) 10 MG tablet Take 10 mg by mouth at bedtime as needed.        . fluticasone (FLONASE) 50 MCG/ACT nasal spray 2 sprays by Nasal route daily.        . cyclobenzaprine (FLEXERIL) 10 MG tablet Take 10 mg by mouth 3 (three) times daily as needed.        . fluticasone (VERAMYST) 27.5 MCG/SPRAY nasal spray 2 sprays by Nasal route as needed.        . meloxicam (MOBIC) 15 MG tablet Take 15 mg by mouth daily.              Review of Systems     Objective:   Physical Exam Filed Vitals:   03/06/11 1048  BP: 130/80  Pulse: 79  Temp:    General - Thin, healthy, no distress HEENT - PERRLA, EOMI, wears glasses, clear sinus drainage, no sinus tenderness, aphthous ulcer left upper buccal mucosa, no exudate, MP 3, high arched palate, no LAN, no thyromegaly Heart - S1S2 regular, no murmur, pulses symmetric Chest - clear to ausculation and percussion  Abd - soft, nontender Ext - no edema, cyanosis, or clubbing Neuro - A&O x 3, normal strength, CN II to XII intact       Assessment & Plan:   ALLERGIC RHINITIS He is to continue with flonase and loratadine.  I will give him a trial of singulair.  Advised him to use nasal irrigation also.  OBSTRUCTIVE SLEEP APNEA He has mild sleep apnea.  He has been intolerant of CPAP.  Will arrange for dental evaluation to assess for oral appliance.  Cough Likely related to allergic rhinitis with post-nasal drip, reflux, and possible asthma.  PFT today were normal.    Will add singulair, and continue other regimen.  DYSPNEA He may have a component of asthma related to allergies, but this does not seem to be prominent.  He is to have a stress test next week to further assess a cardiac cause of his symptoms.    Updated Medication List Outpatient Encounter Prescriptions as of 03/06/2011  Medication Sig Dispense Refill    . esomeprazole (NEXIUM) 40 MG capsule Take 40 mg by mouth daily before breakfast. As needed       . fluticasone (FLONASE) 50 MCG/ACT nasal spray 2 sprays by Nasal route daily.  16 g  6  . HYDROcodone-acetaminophen (NORCO) 5-325 MG per tablet Take 1 tablet by mouth every 6 (six) hours as needed.        . loratadine (CLARITIN) 10 MG tablet Take 10 mg by mouth daily.        Marland Kitchen zolpidem (AMBIEN) 10 MG tablet Take 10 mg by mouth at bedtime as needed.        Marland Kitchen DISCONTD: fluticasone (FLONASE) 50 MCG/ACT nasal spray 2 sprays by Nasal route daily.        . montelukast (SINGULAIR) 10 MG tablet One pill daily at time of sleep  30 tablet  2  . DISCONTD: cyclobenzaprine (FLEXERIL) 10 MG tablet Take 10 mg by mouth 3 (three) times daily as needed.        Marland Kitchen DISCONTD: fluticasone (VERAMYST) 27.5 MCG/SPRAY nasal spray 2 sprays by Nasal route as needed.        Marland Kitchen DISCONTD: meloxicam (MOBIC) 15 MG tablet Take 15 mg by mouth daily.

## 2011-03-06 NOTE — Assessment & Plan Note (Signed)
He may have a component of asthma related to allergies, but this does not seem to be prominent.  He is to have a stress test next week to further assess a cardiac cause of his symptoms.

## 2011-03-06 NOTE — Progress Notes (Signed)
PFT done today. 

## 2011-03-06 NOTE — Patient Instructions (Signed)
Singulair 10 mg at night Loratadine 10 mg daily as needed for allergies Flonase two sprays daily Nasal irrigation (salt water nasal spray) daily before using flonase Will refer to dentistry to evaluate fitting for an oral appliance for sleep apnea

## 2011-03-06 NOTE — Assessment & Plan Note (Signed)
Likely related to allergic rhinitis with post-nasal drip, reflux, and possible asthma.  PFT today were normal.    Will add singulair, and continue other regimen.

## 2011-03-10 LAB — COMPREHENSIVE METABOLIC PANEL
ALT: 15 U/L (ref 0–53)
AST: 14 U/L (ref 0–37)
AST: 19 U/L (ref 0–37)
Albumin: 3.4 g/dL — ABNORMAL LOW (ref 3.5–5.2)
Albumin: 4.8 g/dL (ref 3.5–5.2)
Alkaline Phosphatase: 42 U/L (ref 39–117)
Alkaline Phosphatase: 57 U/L (ref 39–117)
Calcium: 8.8 mg/dL (ref 8.4–10.5)
Chloride: 99 mEq/L (ref 96–112)
GFR calc Af Amer: >60 mL/min (ref >60)
GFR calc Af Amer: >60 mL/min (ref >60)
Potassium: 3.5 mEq/L (ref 3.5–5.1)
Potassium: 3.7 mEq/L (ref 3.5–5.1)
Sodium: 137 mEq/L (ref 135–145)
Sodium: 140 mEq/L (ref 135–145)
Total Bilirubin: 1.6 mg/dL — ABNORMAL HIGH (ref 0.3–1.2)
Total Protein: 6.1 g/dL (ref 6.0–8.3)
Total Protein: 8.4 g/dL — ABNORMAL HIGH (ref 6.0–8.3)

## 2011-03-10 LAB — BASIC METABOLIC PANEL
BUN: 1 mg/dL — ABNORMAL LOW (ref 6–23)
BUN: 1 mg/dL — ABNORMAL LOW (ref 6–23)
BUN: 1 mg/dL — ABNORMAL LOW (ref 6–23)
BUN: 2 mg/dL — ABNORMAL LOW (ref 6–23)
CO2: 25 mEq/L (ref 19–32)
CO2: 26 mEq/L (ref 19–32)
CO2: 27 mEq/L (ref 19–32)
CO2: 28 mEq/L (ref 19–32)
Calcium: 9.2 mg/dL (ref 8.4–10.5)
Calcium: 9.4 mg/dL (ref 8.4–10.5)
Chloride: 105 mEq/L (ref 96–112)
Chloride: 107 mEq/L (ref 96–112)
Creatinine, Ser: 0.77 mg/dL (ref 0.4–1.5)
Creatinine, Ser: 0.99 mg/dL (ref 0.4–1.5)
Creatinine, Ser: 1.02 mg/dL (ref 0.4–1.5)
GFR calc non Af Amer: >60 mL/min (ref >60)
Glucose, Bld: 113 mg/dL — ABNORMAL HIGH (ref 70–99)
Glucose, Bld: 113 mg/dL — ABNORMAL HIGH (ref 70–99)
Glucose, Bld: 127 mg/dL — ABNORMAL HIGH (ref 70–99)
Glucose, Bld: 130 mg/dL — ABNORMAL HIGH (ref 70–99)
Potassium: 3.7 mEq/L (ref 3.5–5.1)
Potassium: 3.8 mEq/L (ref 3.5–5.1)
Sodium: 138 mEq/L (ref 135–145)
Sodium: 140 mEq/L (ref 135–145)

## 2011-03-10 LAB — URINALYSIS, ROUTINE W REFLEX MICROSCOPIC
Bilirubin Urine: NEGATIVE
Glucose, UA: NEGATIVE mg/dL
Protein, ur: NEGATIVE mg/dL
Specific Gravity, Urine: >1.046 — ABNORMAL HIGH (ref 1.005–1.030)
Urobilinogen, UA: 1 mg/dL (ref 0.0–1.0)

## 2011-03-10 LAB — DIFFERENTIAL
Basophils Absolute: 0 10*3/uL (ref 0.0–0.1)
Basophils Relative: 0 % (ref 0–1)
Eosinophils Relative: 1 % (ref 0–5)
Monocytes Absolute: 0.3 10*3/uL (ref 0.1–1.0)
Monocytes Relative: 4 % (ref 3–12)
Neutro Abs: 5.4 10*3/uL (ref 1.7–7.7)

## 2011-03-10 LAB — CBC
MCHC: 35.1 g/dL (ref 30.0–36.0)
Platelets: 222 10*3/uL (ref 150–400)
RDW: 12.8 % (ref 11.5–15.5)
RDW: 12.9 % (ref 11.5–15.5)
WBC: 7.1 10*3/uL (ref 4.0–10.5)

## 2011-03-10 LAB — APTT: aPTT: 30 seconds (ref 24–37)

## 2011-03-10 LAB — PROTIME-INR
INR: 1.23 (ref 0.00–1.49)
Prothrombin Time: 15.4 seconds — ABNORMAL HIGH (ref 11.6–15.2)

## 2011-03-10 LAB — URINE MICROSCOPIC-ADD ON

## 2011-03-10 LAB — GLUCOSE, CAPILLARY: Glucose-Capillary: 89 mg/dL (ref 70–99)

## 2011-03-11 ENCOUNTER — Ambulatory Visit (INDEPENDENT_AMBULATORY_CARE_PROVIDER_SITE_OTHER): Payer: BC Managed Care – PPO | Admitting: Cardiovascular Disease

## 2011-03-11 DIAGNOSIS — R0600 Dyspnea, unspecified: Secondary | ICD-10-CM

## 2011-03-11 DIAGNOSIS — R0989 Other specified symptoms and signs involving the circulatory and respiratory systems: Secondary | ICD-10-CM

## 2011-03-11 DIAGNOSIS — R079 Chest pain, unspecified: Secondary | ICD-10-CM

## 2011-03-11 DIAGNOSIS — R0609 Other forms of dyspnea: Secondary | ICD-10-CM

## 2011-03-11 NOTE — Progress Notes (Signed)
Exercise Treadmill Test  Pre-Exercise Testing Evaluation Rhythm: Sinus  Rate: 71   PR:  .15 QRS:  .14  QT:  .39 QTc: .43     Test  Exercise Tolerance Test Ordering MD: Tonny Bollman, MD  Interpreting MD:  Tonny Bollman, MD  Unique Test No: 1  Treadmill:  1  Indication for ETT: chest pain - rule out ischemia  Contraindication to ETT: No   Stress Modality: exercise - treadmill  Cardiac Imaging Performed: non   Protocol: standard Bruce - maximal  Max BP:  154/63  Max MPHR (bpm):  177 85% MPR (bpm):  150  MPHR obtained (bpm):  176 % MPHR obtained:  98  Reached 85% MPHR (min:sec):  6:00 Total Exercise Time (min-sec):  7:37  Workload in METS:  9.5 Borg Scale: 17  Reason ETT Terminated:  dyspnea    ST Segment Analysis At Rest: normal ST segments - no evidence of significant ST depression With Exercise: no evidence of significant ST depression  Other Information Arrhythmia:  No Angina during ETT:  absent (0) Quality of ETT:  diagnostic  ETT Interpretation:  normal - no evidence of ischemia by ST analysis  Comments: Baseline RBBB. No chest pain, significant ST changes, or arrhythmia with exertion. Fair exercise tolerance.  Recommendations: Suspect noncardiac chest pain. No further CV evaluation recommended at this time.

## 2011-03-11 NOTE — Patient Instructions (Signed)
Your physician has requested that you have an echocardiogram. Echocardiography is a painless test that uses sound waves to create images of your heart. It provides your doctor with information about the size and shape of your heart and how well your heart's chambers and valves are working. This procedure takes approximately one hour. There are no restrictions for this procedure.  Your physician recommends that you schedule a follow-up appointment as needed.  

## 2011-03-11 NOTE — Progress Notes (Signed)
Addended by: Julieta Gutting on: 03/11/2011 11:51 AM   Modules accepted: Orders

## 2011-03-12 ENCOUNTER — Telehealth: Payer: Self-pay | Admitting: Pulmonary Disease

## 2011-03-12 NOTE — Telephone Encounter (Signed)
lmomtcb x1 

## 2011-03-13 ENCOUNTER — Encounter: Payer: Self-pay | Admitting: Pulmonary Disease

## 2011-03-13 NOTE — Telephone Encounter (Signed)
[id number] 

## 2011-03-17 NOTE — Telephone Encounter (Signed)
lmomtcb x 3  

## 2011-03-18 NOTE — Telephone Encounter (Signed)
lmomtcb x4 and advised pt if he needs [place] for anything else to give [place] a call back and will be more than happy to help him. Will sign off message

## 2011-03-20 ENCOUNTER — Ambulatory Visit (HOSPITAL_COMMUNITY): Payer: BC Managed Care – PPO | Attending: Cardiovascular Disease

## 2011-03-20 DIAGNOSIS — R072 Precordial pain: Secondary | ICD-10-CM | POA: Insufficient documentation

## 2011-03-20 DIAGNOSIS — R0609 Other forms of dyspnea: Secondary | ICD-10-CM

## 2011-03-20 DIAGNOSIS — I079 Rheumatic tricuspid valve disease, unspecified: Secondary | ICD-10-CM | POA: Insufficient documentation

## 2011-03-20 DIAGNOSIS — R0989 Other specified symptoms and signs involving the circulatory and respiratory systems: Secondary | ICD-10-CM

## 2011-03-20 DIAGNOSIS — I080 Rheumatic disorders of both mitral and aortic valves: Secondary | ICD-10-CM | POA: Insufficient documentation

## 2011-03-20 DIAGNOSIS — R0600 Dyspnea, unspecified: Secondary | ICD-10-CM

## 2011-03-20 DIAGNOSIS — I379 Nonrheumatic pulmonary valve disorder, unspecified: Secondary | ICD-10-CM | POA: Insufficient documentation

## 2011-04-22 NOTE — Op Note (Signed)
NAMEEVAAN, TIDWELL               ACCOUNT NO.:  [id number]   MEDICAL RECORD NO.:  [id number]          PATIENT TYPE:  AMB   LOCATION:  [facility]                         FACILITY:  [facility]   PHYSICIAN:  [name], M.D.DATE OF BIRTH:  11/24/1966   DATE OF PROCEDURE:  04/06/2008  DATE OF DISCHARGE:                               OPERATIVE REPORT   POSTOPERATIVE DIAGNOSIS:  Chronic anal fissure.   POSTOPERATIVE DIAGNOSIS:  Chronic anal fissure.   SURGICAL PROCEDURES:  Lateral internal anal sphincterotomy.   SURGEON:  Lorne Skeens. Hoxworth, M.D.   ANESTHESIA:  Laryngeal mask general.   BRIEF HISTORY:  Mr. Bommarito is a 45 year old male with a recurrent chronic  posterior anal fissure.  This has responded partly to medical management  but is a recurring problem for him, and he would like to proceed with  lateral internal anal sphincterotomy.  The nature of the procedure, its  indications, risks of bleeding, infection, failure to heal, and degrees  of incontinence have been discussed and understood.  He is now brought  to the operating room for this procedure.   DESCRIPTION OF OPERATION:  The patient was brought to the operating room  and placed in the supine position on the operating table, and laryngeal  mask general anesthesia was induced.  He was carefully positioned and  padded in the lithotomy position and the perineum sterilely prepped and  draped.   A blunt retractor was placed in the anus.  Examination revealed evidence  of a posterior healing midline fissure.  There was hypertrophy of the  internal anal sphincter.  The intersphincteric groove was palpated on  the right side and a small stab incision made at this point.  The tip of  hemostat was then placed into the intersphincteric groove, and the  hypertrophied portion of the internal anal sphincter was surrounded,  brought up into the small incision, and divided with cautery.  Hemostasis was obtained with pressure.  A  perianal block of Marcaine  with epinephrine was performed.  Hemostasis was assured.  Sponge and  needle counts were correct.   The patient was taken to recovery in good condition.      Lorne Skeens. Hoxworth, M.D.  Electronically Signed     BTH/MEDQ  D:  04/06/2008  T:  04/06/2008  Job:  [id number]

## 2011-04-22 NOTE — H&P (Signed)
NAMEJAHZIEL, [name] NO.:  [id number]   MEDICAL RECORD NO.:  [id number]          PATIENT TYPE:  INP   LOCATION:  6729                         FACILITY:  [facility]   PHYSICIAN:  Adolph Pollack, M.D.DATE OF BIRTH:  1966/08/24   DATE OF ADMISSION:  02/15/2008  DATE OF DISCHARGE:                              HISTORY & PHYSICAL   CHIEF COMPLAINT:  Abdominal pain.   HISTORY OF PRESENT ILLNESS:  Mr. Epperly is a 45 year old male who has what  we appear to believe a chronic intestinal motility disorder.  This all  began back in 1994 when he had a partial colectomy in the [place] who what  was felt to be a colonic dysmotility.  He then developed a small bowel  obstruction requiring exploratory laparotomy in November of 2002 as well  as in June of 2005.  Over the past 6 months he has been complaining of  some burning and pressure-type upper abdominal pain that sometimes goes  to his left back area.  He had a CT scan at [facility].  This apparently demonstrated some cholelithiasis.  The CT was ordered  because he had a CT urogram at the [facility] looking for kidney  stones, and this was negative for kidney stones but showed the  gallstones.  He subsequently saw Dr. Johna Sheriff February 10, 2008 who did not  have many of the studies, but he obtained his records.  It was felt that  he had symptomatic cholelithiasis, and they discussed potentially doing  a cholecystectomy pending Dr. Jamse Mead review of all of his records.  However, he developed nee pain that is lower abdominal discomfort.  His  bowels have been somewhat slower than usual.  He does note that he has  been taking a proton pump inhibitor as well as narcotics for his  intermittent pain.  These pains began in October of 2008.   PAST MEDICAL HISTORY:  1. Ill-defined intestinal dysmotility.  2. Recurrent small-bowel obstructions.   PAST SURGICAL HISTORY:  1. Partial colectomy.  2. Exploratory laparotomy  with lysis of adhesions in 2002.  3. Exploratory laparotomy with lysis the adhesions in 2005.   ALLERGIES:  NONE.   MEDICATIONS:  Protonix, hydrocodone, MiraLax p.r.n., zolpidem, a  sleeping medicine.   SOCIAL HISTORY:  He is single and working.  No cigarette or alcohol use.   FAMILY HISTORY:  Noncontributory.   REVIEW OF SYSTEMS:  GENERAL:  A has had no fever or chills with these  episodes but states he has lost some weight because he does not feel  like eating.  RESPIRATORY:  No shortness of breath or cough.  CARDIAC:  He has no hypertension or heart disease.   PHYSICAL EXAMINATION:  GENERAL:  Slightly ill-appearing male but very  pleasant and cooperative.  VITAL SIGNS:  Temperature is 97.4, blood pressure is 109/75, pulse 20.  HEENT:  Extraocular motions intact.  No icterus.  NECK:  Supple without masses or glandular enlargement.  RESPIRATORY:  Breath sounds equal and clear respirations unlabored.  CARDIOVASCULAR:  Regular rate, regular rhythm.  I hear no murmurs.  ABDOMEN:  Soft.  Well-healed midline scar.  There is some mild lower  abdominal tenderness.  No masses or hernias present.  MUSCULOSKELETAL:  Good muscle tone and range of motion.   LABORATORY DATA:  Normal for white cell count of 7500, with a hemoglobin  of 17.2 - this was taken at the Urgent Care.  His lipase is normal at  21, and his amylase is normal at 77. A complete metabolic profile  demonstrates a total bilirubin of 1.4, but other liver functions are  normal.  The glucose is 102.  Other electrolytes normal.   IMPRESSION:  Chronic upper abdominal pain which may be secondary to  cholelithiasis.  He now has some lower abdominal discomfort which may be  secondary to some constipation.  I have reviewed some of his films from  the [facility], and they show a lot of stool in the colon and  maybe a couple of dilated small bowel loops, but it is difficult to tell  if this is an obstruction pattern or  not.   PLAN:  Will go ahead and start rehydrating him.  Will admit him to the  hospital and obtain a CT scan of the abdomen.  Further recommendations  and treatment will be based on those results.      Adolph Pollack, M.D.  Electronically Signed     TJR/MEDQ  D:  02/16/2008  T:  02/17/2008  Job:  [id number]

## 2011-04-22 NOTE — Consult Note (Signed)
NAME:  [name], [name] NO.:  [id number]   MEDICAL RECORD NO.:  [id number]          PATIENT TYPE:  INP   LOCATION:  6741                         FACILITY:  [facility]   PHYSICIAN:  Graylin Shiver, M.D.   DATE OF BIRTH:  07-Apr-1966   DATE OF CONSULTATION:  03/03/2008  DATE OF DISCHARGE:                                 CONSULTATION   REASON FOR CONSULTATION:  The patient is a 45 year old male with a  history of chronic intestinal motility disorder who had a partial  colectomy in 1994 in [place].  He has had several recurrent small-bowel  obstructions secondary to adhesions and multiple surgeries for lysis of  adhesions.  His most recent surgery was a cholecystectomy and another  lysis of adhesions for small-bowel obstruction on this admission.   The current ongoing issue at this time is that he has a chronic postop  ileus which he has had before after surgeries.  He is currently on  Reglan 10 mg IV t.i.d. and erythromycin 250 mg q.6 h. IV which was  started yesterday.  He is apparently trying to eat food from the outside  which his friends bring in despite his being told not to do this.  When  he does eat this food, he vomits.  We are asked to see him to see if  there is anything else that we can do.  He was on a morphine PCA pump,  but this was stopped yesterday and he was converted over to Percocet.  He is not ambulating that much.  His potassium today is 3.7   PAST HISTORY:  As above.   MEDICATIONS:  Noted on his medication sheet on the chart.   ALLERGIES:  NONE KNOWN.   REVIEW OF SYSTEMS:  He is not having any chest pain or shortness of  breath.   PHYSICAL EXAMINATION:  VITAL SIGNS:  Stable.  GENERAL:  He is in no distress, nonicteric.  HEART:  Regular rhythm.  No murmurs.  LUNGS:  Clear.  ABDOMEN:  Bowel sounds are present.  It is soft.  He has some mild  diffuse tenderness.  He does have a gastrostomy feeding tube in place on  the left side of his abdomen  which can be used for decompression as well  as feeding if necessary.   IMPRESSION:  1. Chronic gastrointestinal dysmotility disorder.  2. Recurrent postop ileus which at the present time is prolonged   PLAN:  Would increase Reglan to 10 mg q.6 h.  Continue erythromycin at  current dose. Ambulate often.  Try to discontinue all narcotics which  can slow GI motility.  Try to get serum potassium up to around 4.5.  We  will give the patient some runs of potassium.  We will follow with you.           ______________________________  Graylin Shiver, M.D.     SFG/MEDQ  D:  03/03/2008  T:  03/04/2008  Job:  [id number]   cc:   [facility]  Llana Aliment. Malon Kindle., M.D.

## 2011-04-22 NOTE — Op Note (Signed)
Steven, [name]               ACCOUNT NO.:  [id number]   MEDICAL RECORD NO.:  [id number]          PATIENT TYPE:  INP   LOCATION:  6741                         FACILITY:  [facility]   PHYSICIAN:  [name], M.D.DATE OF BIRTH:  09-21-1966   DATE OF PROCEDURE:  02/18/2008  DATE OF DISCHARGE:                               OPERATIVE REPORT   PREOPERATIVE DIAGNOSES:  1. Partial small bowel obstruction.  2. Cholelithiasis.   POSTOPERATIVE DIAGNOSIS:  1. Partial small bowel obstruction.  2. Cholelithiasis.   SURGICAL PROCEDURES:  Laparotomy with lysis of adhesions and  cholecystectomy with cholangiogram.   SURGEON:  [name], M.D.   ASSISTANT:  [name], M.D.   ANESTHESIA:  General.   BRIEF HISTORY:  [name] is a 45 year old male with a history of  laparotomy for uncertain diagnosis in the [place] a number of years ago.  He then underwent laparotomy with lysis of adhesions for small bowel  obstruction by [name] about 2002 with a very prolonged  postoperative ileus and there was concern over an intestinal motility  disorder.  He then developed a high-grade small bowel obstruction and  underwent laparotomy by me in 2005 with again a very prolonged  postoperative ileus.  He now presents with several months of pain  different from his previous episodes of bowel obstruction with upper  abdominal and chest pain.  He has been found to have cholelithiasis.  We  were considering cholecystectomy and during this period of time he  developed again lower abdominal pain and distention and was admitted  several days ago.  CT scan showed a high-grade small bowel obstruction.  Followup plain films show persistent or slightly worsening small bowel  obstruction.  He continues to have lower abdominal pain.  With this  constellation of findings we have elected to proceed with laparotomy,  lysis of adhesions and cholecystectomy.  The nature of the procedure,  indications, risks of bleeding, infection, bile leak, bile duct injury,  intestinal injury and again risk for prolonged ileus have been discussed  and understood.  The patient is now brought to the operating room for  this procedure.   DESCRIPTION OF OPERATION:  The patient was brought to the operating room  and placed in supine position on the operating room table and general  endotracheal anesthesia was induced.  The abdomen was widely sterilely  prepped and draped.  He was given preoperative IV antibiotics.  Correct  patient and procedure were verified.  PAS were in place.  The previous  long midline incision was excised and dissection carried down through  the subcutaneous tissue and the midline fascia and the peritoneum  entered under direct vision.  There were adhesions of small bowel loops  of omentum to the anterior abdominal wall which were all carefully taken  down.  There was significantly dilated proximal small bowel.  An  extensive adhesiolysis was then performed.  There were numerous  adhesions from the ligament of Treitz all the way to the ileocecal valve  both between bowel loops and to the abdominal wall.  These were all  carefully lysed.  The proximal small bowel was quite dilated.  The  terminal ileum was completely decompressed.  However, as we lysed the  adhesions from proximal to distal there was really no single obvious  point of obstruction, although based on the dilatation this appeared to  be a mechanical obstruction.  The lysis of adhesions took approximately  2 hours.  After all the adhesions were lysed we carefully examined the  small bowel.  There were a few serosal injuries that were imbricated  with 3-0 silks.  The small bowel was again run from the ligament of  Treitz to ileocecal valve and was entirely unremarkable for any injury  or other abnormality and all the adhesions had been lysed.  The bed was  irrigated and hemostasis assured.   Attention was  then turned to the cholecystectomy.  Some omental  adhesions were taken down from the right upper quadrant.  The  gallbladder was exposed.  A pack was placed over the liver for exposure.  The fundus was grasped and retracted inferolaterally.  Adhesions were  taken down off the body of the gallbladder to the infundibulum which was  then retracted inferolaterally.  Calot's triangle was exposed and  peritoneum anterior to Calot's triangle was incised.  Careful blunt  dissection in Calot's triangle dissected free the cystic artery and the  cystic duct.  The common duct was visualized and protected.  The cystic  artery was seen coursing over the gallbladder wall and was divided  between two proximal and distal clips.  The cystic duct was then  isolated and an operative cholangiogram obtained through the cystic  duct.  This showed good filling of the normal common bile duct and  intrahepatic ducts with free flow into the duodenum and no filling  defects.  Following this the cholangiocath was removed.  The cystic duct  was doubly clipped proximally and divided.  The gallbladder was then  dissected free from its bed using cautery and removed.  Complete  hemostasis was obtained in the gallbladder bed using cautery.  Following  this a Stamm gastrostomy was performed due to the patient's history of  prolonged ileus.  He had had a previous gastrostomy and the old  gastrostomy site was taken down.  Concentric 2-0 chromic pursestring  sutures were placed around this and a small gastrotomy made.  A 26 Foley  was brought through a stab wound in the left upper quadrant and placed  in the stomach and the balloon inflated.  The pursestring sutures were  secured.  The stomach was then sutured up to the anterior chest wall  with 4 interrupted 2-0 silks.  Following this returned to the anatomic  position.  The abdomen irrigated.  Hemostasis assured.  A piece of  SurgiWrap was placed over the viscera anteriorly.   The anterior fascia  was closed with running looped #1 PDS and the incision was tied  centrally.  The subcutaneous tissue was irrigated.  Skin closed with  staples.  Sponges and needle counts were correct.  The patient was taken  to recovery in good condition.      Lorne Skeens. [name], M.D.  Electronically Signed     BTH/MEDQ  D:  02/18/2008  T:  02/19/2008  Job:  [id number]

## 2011-04-25 NOTE — Discharge Summary (Signed)
NAMEJOEANGEL, [name] NO.:  [id number]   MEDICAL RECORD NO.:  [id number]                   PATIENT TYPE:  INP   LOCATION:  0453                                 FACILITY:  [facility]   PHYSICIAN:  [name], M.D.          DATE OF BIRTH:  1965-12-22   DATE OF ADMISSION:  05/26/2004  DATE OF DISCHARGE:  06/13/2004                                 DISCHARGE SUMMARY   OPERATIONS AND PROCEDURES:  Exploratory laparotomy and lysis of adhesions  for small bowel obstruction on May 27, 2004.   CONSULTATIONS:  Gastroenterology, Dr. Randa Evens, on June 11, 2004.   HISTORY OF PRESENT ILLNESS:  Mr. Range is a 45 year old black male originally  from the [place] with a history of repeated small bowel obstruction.  He has a  history of a partial colectomy in the [place] about 1994 for uncertain  indication but possibly colonic ileus.  He subsequently has had a number of  hospitalizations for small bowel obstruction which generally have resolved  nonoperatively.  In November 2002, however, he underwent laparotomy and  lysis of adhesions by Dr. Avel Peace for obstruction with an apparent  single adhesion found at the bladder per his operative note.  The patient  has not required hospitalization since but states he still gets episodes of  mid-abdominal pain and nausea which then will resolve without seeking  medical attention.  With this illness, he has had one week of increasing  abdominal pain, bloating, and nausea.  He did have some diarrhea two days  ago.  Last night, the pain became more severe with frequent vomiting.  [facility]  [facility] and x-rays indicated bowel  obstruction and he was admitted.  He denies fever or chills.  He states he  has had some diarrhea intermittently throughout the illness, last bowel  movement being seven or eight hours ago.  No melena or hematochezia.  The  pain is located in the midabdomen slightly to the  left.  It is felt as  pressure and sharp pain.   PAST MEDICAL HISTORY:  Surgery significant as detailed above for remote  colectomy and laparotomy and lysis of adhesions for small bowel obstruction.  Medically, he is treated for seasonal allergies with no severe medical  illnesses.   MEDICATIONS:  1. Flonase.  2. Allegra-D.   No known allergies.   SOCIAL HISTORY/FAMILY HISTORY/REVIEW OF SYSTEMS:  See detailed H&P.   PERTINENT PHYSICAL EXAMINATION:  VITAL SIGNS:  Temperature was 99, pulse  103, respirations 20, blood pressure 117/78.  GENERAL:  He is a well-developed black male in no acute distress.  ABDOMEN:  Revealed mild to moderate distension.  There was a well-healed  paramedian incision without hernias.  No inguinal hernias.  There is  moderate tenderness in the midabdomen and left midabdomen.  Minimal guarding  and no peritoneal signs.   LABORATORY DATA:  CBC of 11,000,  hemoglobin 16.5, 15-20 white cells in his  urine.   Flat and upright abdominal x-rays revealed several quite dilated loops of  small bowel consistent with high-grade small bowel obstruction.   HOSPITAL COURSE:  The patient was admitted, NG tube was placed.  He was  treated with pain medications, antiemetics, and IV fluids.  The following  day, on reevaluation, he had had a large amount of bilious NG drainage.  His  abdomen remained mildly distended and mildly tender in the left midabdomen.  Flat and upright abdomen showed no improvement in markedly dilated loops of  small bowel consistent with small bowel obstruction.  With the finding of  persistent high-grade small bowel obstruction at this point, I recommended  proceeding with laparotomy.  At the time of surgery, he was found to have  multiple adhesions to his small bowel but no one clear point of high-grade  mechanical obstruction.  He underwent extensive lysis of adhesions.  A  gastrostomy tube was also placed due to his intolerance of the NG tube.   Postoperatively, his course was most significant for a very prolonged  postoperative ileus.  His abdomen remained soft but no flatus, no bowel  sounds.  He continued on gastrostomy drainage.  He was treated with a course  of IV Cipro for one week for an apparent UTI he had on admission.  By June  27, the seventh postoperative day, he had some nausea and vomiting after his  gastrostomy was clamped.  He was put back on drainage for 24 hours then it  was reclamped.  At this point, he had persistent nausea but no vomiting.  He  was able to tolerate some liquids.  He had a couple of small bowel  movements; however, he continued to have persistent nausea and then had  large volume emesis on the 10th postoperative day, June 30.  Again, on the  11th postoperative day he had vomiting.  A PICC line was placed and TNA was  started at this time.  Abdominal x-rays looked most consistent with an  ileus.  He was maintained on TNA and just sips of liquids over the next  several days.  X-ray on July 5 showed a large amount of gas in the colon  without small bowel dilatation.  I asked Dr. Randa Evens to see the patient due  to question of motility disorder.  At this point, I had already put him on  Reglan IV.  Dr. Randa Evens felt that motility disorder was likely and he has  also recommended Zelnorm which was started b.i.d.  Over the next couple of  days, the patient had some significant improvement.  He was able to be  advanced to a soft diet.  On July 7, he had a bowel movement.  The abdomen  was soft and nontender.  He was tolerating the majority of his diet.  His  wound had healed and staples were removed.  At this point, he was felt ready  for discharge.   DISCHARGE MEDICATIONS:  His usual medications plus:  1. Reglan 10 mg a.c. and h.s.  2. Zelnorm b.i.d.  3. Phenergan p.r.n.   ACTIVITY:  Limitations were explained prior to discharge.  FOLLOW UP:  In my office in two weeks.  Lorne Skeens. Hoxworth, M.D.    Tory Emerald  D:  07/02/2004  T:  07/02/2004  Job:  [id number]

## 2011-04-25 NOTE — H&P (Signed)
NAMEHARLIS, [name] NO.:  [id number]   MEDICAL RECORD NO.:  [id number]                   PATIENT TYPE:  INP   LOCATION:  0453                                 FACILITY:  [facility]   PHYSICIAN:  [name], M.D.          DATE OF BIRTH:  Jun 07, 1966   DATE OF ADMISSION:  05/26/2004  DATE OF DISCHARGE:                                HISTORY & PHYSICAL   CHIEF COMPLAINT:  Abdominal pain, nausea, and vomiting.   HISTORY OF PRESENT ILLNESS:  Mr. Shaddix is a 45 year old black male from the  [place] who has a history of repeated small bowel obstructions. He has a  history of a partial colectomy in the [place] about 1994 for what sounds like  colonic ileus. Subsequent to that he has had a number of hospitalization for  small bowel obstruction. These generally have resolved nonoperatively.  However, in November 2002, he underwent laparotomy and lysis of adhesions  for mechanical small bowel obstruction by Dr. Avel Peace. A single  adhesion was found to the bladder. The patient has not required  hospitalization since, but states he still gets episodes of mid abdominal  pain and nausea, but then will resolve. For about a week, he has had  increasing mid abdominal pain, bloating, and nausea. Approximately two days  ago he began having diarrhea. Last night the abdominal pain became more  severe, associated with frequent vomiting. He presented to the urgent care  center today and was referred for further treatment when x-rays indicated  small bowel obstruction. He denies any fever or chills. He states he has had  diarrhea throughout his illness, the last bowel movement being six or seven  hours ago. No melena or hematochezia. The pain is located in the mid abdomen  and slightly to the left of the mid abdomen, and is felt as pressure and  sharp pain.   PAST MEDICAL HISTORY:  Surgery is significant as detailed above for remote  colectomy and also laparotomy and  lysis of adhesions for small bowel  obstruction. Medically, he is treated for seasonal allergies. No severe  medical illnesses.   MEDICATIONS:  Flonase and Allegra D.   ALLERGIES:  No known allergies.   SOCIAL HISTORY:  Single. He does not smoke cigarettes or drink alcohol.   FAMILY HISTORY:  Noncontributory.   REVIEW OF SYSTEMS:  GENERAL: No fever, chills, or weight change. HEENT:  Positive for sinus congestion and some throat irritation. No vision or  hearing problems. RESPIRATORY: No shortness of breath, cough, or wheezing.  CARDIAC:  No chest pain, palpitations, or history of heart disease.  GI: As  above. GU: Denies urinary burning or frequency. MUSCULOSKELETAL: No joint  pain or swelling.   PHYSICAL EXAMINATION:  VITAL SIGNS: Temperature 99, pulse 103, respirations  20, blood pressure 117/78.  GENERAL: Alert, well-developed, black male in no acute distress.  SKIN: Warm and dry without rash or infection.  HEENT: No palpable mass or thyromegaly. Sclerae nonicteric. Nares and  oropharynx clear.  LYMPH NODES: No cervical, supraclavicular, or inguinal nodes palpable.  LUNGS: Clear to auscultation without increased work of breathing.  CARDIAC: Regular rate and rhythm without murmurs. No edema.  ABDOMEN: Mild to moderate distention. Well-healed paramedian incision  without hernias. No inguinal hernias. There is moderate tenderness in the  mid abdomen and left mid abdomen. Minimal guarding and no peritoneal signs.  No palpable masses or hepatosplenomegaly. Bowel sounds sound hypoactive.  EXTREMITIES: No joint swelling or deformity.  NEUROLOGIC: He is alert, fully oriented, and cooperative.   LABORATORY DATA:  Obtained at [facility], urinalysis  shows a large amount of leukocytes, 15-20 white cells on micro, and 100 mcg  of protein. CBC shows white count of 11.5, hemoglobin 16.5. Chemistries and  lipase from [facility] are pending.   I reviewed flat and  upright abdominal x-rays obtained urgent medical care  which show several quite dilated loops of apparent small bowel consistent  with high-grade small bowel obstruction.   ASSESSMENT/PLAN:  A 45 year old [place] male with a history of colectomy  and history of repeated small bowel obstruction requiring one laparotomy in  2003. He now has a picture consistent with high-grade mechanical small bowel  obstruction. He is minimally tender, white count is minimally elevated, and  I think follow up with nonsurgical management is warranted. He will be  treated with NG suction, IV fluids, and medications. We will repeat  abdominal x-rays in about 12 hours.                                               Lorne Skeens. Hoxworth, M.D.    Tory Emerald  D:  05/26/2004  T:  05/27/2004  Job:  [id number]

## 2011-04-25 NOTE — Consult Note (Signed)
[name], [name]                           ACCOUNT NO.:  [id number]   MEDICAL RECORD NO.:  [id number]                   PATIENT TYPE:  INP   LOCATION:  0453                                 FACILITY:  [facility]   PHYSICIAN:  James L. Malon Kindle., M.D.          DATE OF BIRTH:  June 14, 1966   DATE OF CONSULTATION:  06/11/2004  DATE OF DISCHARGE:                                   CONSULTATION   REFERRED BY:  [name], M.D.   HISTORY:  The patient is a 45 year old Arabic male from [place] who was  admitted on May 26, 2004 with a small bowel obstruction.  His history dates  back into the mid-1990's when he had an apparent motility disorder of his  colon or something of that nature, and underwent removal of a portion of his  colon in [place].  We have no records of this, and he is unable to tell me  much, other than that his colon was dilated, so presumably this was a  motility disorder.  He is unable to tell me which portion of his colon was  removed.  Since being here in [place], he has had repetitive small bowel  obstructions due to adhesions, some of which have resolved spontaneously  without operation.  In November of 2002, he underwent a laparotomy and lysis  of adhesions by Dr. Abbey Chatters, with a single adhesion found at the bladder.  He has not had hospitalization since then.  He had been having some  increasing bloating and nausea prior to his admission this time.  He was  admitted on May 26, 2004, and subsequently underwent a small bowel  obstruction per Dr. Johna Sheriff with laparotomy and lysis of adhesions.  It  appeared at surgery that he had a normal right and transverse colon with a  fairly straight colon from the left colon to the pelvis.  Without a clear  anastomosis, it was felt that he likely had had a sigmoid colectomy.  He had  adhesions in the small bowel, and had a gastrostomy tube placed.  Since  then, he has had difficulties eating with prolonged ileus on  films.  Nothing  has really seemed to help.  He is passing a bit of air.  We are asked to see  him to see if anything else needed to be addressed.  He is not particularly  distended, but any time he eats small amounts, he does feel bloated.   CURRENT MEDICATIONS:  1. Ensure.  2. Reglan.  3. Phenergan.  4. Robitussin.  5. Zofran.  6. Toradol.   ALLERGIES:  He has no drug allergies.   MEDICAL HISTORY:  Noted as above with apparently left colectomy in [place]  with subsequent laparotomy and lysis of adhesions.  He has no medical  illnesses.   FAMILY HISTORY:  Noncontributory.   SOCIAL HISTORY:  He is single.  Does not smoke or drink.  Lives  here in  [place].   PHYSICAL EXAMINATION:  VITAL SIGNS:  The patient is afebrile.  Vital signs  are normal.  GENERAL:  A pleasant, oriented, Arabic male in no acute distress.  HEENT:  Sclerae are anicteric.  LUNGS:  Clear.  HEART:  Regular rate and rhythm without murmurs or gallops.  ABDOMEN:  Soft and nontender with diminished but present bowel sounds.  Generally nontender.  There is a gastrostomy tube in place, and a surgical  scar.  The surgical wound appears normal.   PERTINENT LABORATORIES:  White count of 6.4 on June 08, 2004, potassium 3.9,  calcium 9.3.   ASSESSMENT:  Prolonged postoperative ileus.  It may well be due to irritable  bowel, and some sort of underlying chronic motility disorder with  superimposed surgery and narcotics.  He does gradually appear to be  improving.   RECOMMENDATIONS:  Will try Zelnorm.  Will also try to increase his potassium  and try to keep it around 4.5 to 5.  Will follow him with you.                                               James L. Malon Kindle., M.D.    Waldron Session  D:  06/11/2004  T:  06/11/2004  Job:  [id number]   cc:   Lorne Skeens. Hoxworth, M.D.  [address], [address]  [place]  [place] [address]  Fax: [phone]

## 2011-04-25 NOTE — Op Note (Signed)
NAMEVERDUN, RACKLEY NO.:  [id number]   MEDICAL RECORD NO.:  [id number]                   PATIENT TYPE:  INP   LOCATION:  0453                                 FACILITY:  [facility]   PHYSICIAN:  [name], M.D.          DATE OF BIRTH:  [birth year]   DATE OF PROCEDURE:  05/27/2004  DATE OF DISCHARGE:                                 OPERATIVE REPORT   PREOPERATIVE DIAGNOSES:  Small bowel obstruction.   POSTOPERATIVE DIAGNOSES:  Small bowel obstruction.   PROCEDURE:  Laparotomy and lysis of adhesions and Stamm gastrostomy.   SURGEON:  Lorne Skeens. Hoxworth, M.D.   ASSISTANT:  Sandria Bales. Ezzard Standing, M.D.   ANESTHESIA:  General.   BRIEF HISTORY:  Mr. Carreon is a 45 year old black male originally from the  [place].  He has a history of a partial colectomy in [place] for uncertain  indications possibly some sort of obstruction.  He subsequently required  laparotomy and lysis of adhesions for small bowel obstruction approximately  three years ago.  He now presents with worsening crampy mid and left mid  abdominal pain over several days with frequent bilious, nausea and vomiting.  He has also had some diarrhea. Abdominal x-rays yesterday revealed markedly  dilated proximal small bowel.  Their fluid level is consistent with high  grade small bowel obstruction.  He has been treated with NG suction and IV  fluids with no improvement in his clinical picture or x-rays today.  For  this reason, laparotomy has been recommended and accepted. The nature of the  procedure, indications, risk of bleeding, infection and bowel injury were  discussed and understood. The patient is very intolerant of the NG tube and  we also planned to place a gastrostomy.   DESCRIPTION OF PROCEDURE:  The patient was brought to the operating room,  placed in supine position on the operating table and general endotracheal  anesthesia was induced.  He was already on antibiotics.  The abdomen  was  widely sterilely prepped and draped. A Foley catheter was inserted. The  previous left paramedian incision was used excising the dermal scar and  carrying the dissection down through the subcutaneous tissue, fascia, muscle  layers with cautery and the peritoneum was entered under direct vision.  There were some adhesions to the omentum to the anterior abdominal wall that  were completely taken down. There was a moderate amount of clear yellow  ascites. There was markedly dilated proximal small bowel.  This was traced  distally and there were numerous interloop adhesions and points of adhesions  to the anterior abdominal wall that were taken down. As the dissection  progressed distally, the bowel somewhat gradually tapered down to completely  decompressed ileum for the last several feet.  However, we never encountered  a single adhesion that was obviously a single point of obstruction. The  bowel was then traced back proximally.  This was done from  the ileocecal  valve which was clearly identified entering the cecum and it was determined  that all adhesions were completely lysed up towards the ligament of Treitz.  At the ligament of  Treitz, the bowel was quite dilated and traveled  underneath a portion of the left colon mesentery and was adherent to the  left lateral abdominal wall lateral to the left colon. Although the bowel  was dilated below this, it appeared that might have been some trapping of  the bowel in this area and with fairly tedious extensive dissection, the  bowel was freed from the mesenteric defect in the colon and off the lateral  abdominal wall and freed back medially toward the ligament of Treitz in its  normal anatomic position. The mesenteric defect of the left colon was then  closed with interrupted 2-0 silks leaving all of the small bowel on the  correct side of the mesentery. Again this was not obvious if this was  __________ obstruction.  Examination of the  colon showed a normal appearing  right transverse colon and there was fairly straight colon from the left  colon down to the pelvis although we did not see a definite anastomosis.  He  likely had a previous sigmoid colectomy.  The small bowel was again  inspected. A small area of serosal tear was inverted with some 3-0 silk  sutures in the mid jejunum.  There was no full thickness injury.  Again it  was confirmed that the small bowel was completely free of adhesions from the  ligament of Treitz to the ileocecal valve.  The patient had some mildly  elevated LFT's which had been noticed on previous admission and I did  carefully inspect the liver which appeared normal and the gallbladder which  was normal and did not feel any stones.  The stomach was then grasped and  brought down toward the incision and a 22 Foley catheter was brought through  a stab wound in the anterior abdominal wall and placed into the stomach with  two concentric chromic pursestring suture. The stomach was then sutured up  to the anterior abdominal wall with interrupted 2-0 silk. The abdomen was  abdomen was again inspected for hemostasis, the viscera returned to their  anatomic position. The fascia was closed with running #1 PDS begun at either  end of the incision and tied centrally.  The subcutaneous tissue was  irrigated and skin closed with staples. Sponge, needle and instrument counts  were correct.  Dry sterile dressings were applied and the patient taken to  the recovery room in good condition.                                               Lorne Skeens. Hoxworth, M.D.    Tory Emerald  D:  05/27/2004  T:  05/27/2004  Job:  [id number]

## 2011-04-25 NOTE — Discharge Summary (Signed)
NAMECHASTON, [name] NO.:  [id number]   MEDICAL RECORD NO.:  [id number]          PATIENT TYPE:  INP   LOCATION:  6741                         FACILITY:  [facility]   PHYSICIAN:  Alfonse Ras, MD   DATE OF BIRTH:  Dec 06, 1966   DATE OF ADMISSION:  02/15/2008  DATE OF DISCHARGE:  03/07/2008                               DISCHARGE SUMMARY   ADMITTING PHYSICIAN:  Adolph Pollack, MD   DISCHARGING PHYSICIAN:  Alfonse Ras, MD   OPERATING SURGEON:  Lorne Skeens. Hoxworth, MD   PROCEDURE:  Laparotomy with lysis of adhesions and cholecystectomy with  intraoperative cholangiogram.   CONSULTANTS:  Graylin Shiver, MD with [facility].   REASON FOR HOSPITALIZATION:  Mr. Magoon is a  45 year old male who has a  history of a chronic intestinal motility disorder.  Apparently, this all  began back in 1994, when he had a partial colectomy and this event was  with a colonic dysmotility issue.  He then developed a small bowel  obstruction requiring exploratory laparotomy in November 2002, as well  as in June 2005.  Over the past 6 months, he has been complaining of  some burning and pressure type of lower abdominal pain that sometimes  goes to his left back area.  He had a CT scan at [facility]  that demonstrated some cholelithiasis.  The CT was ordered because he  had a [facility] looking for kidney stones and  this was negative for kidney stones, which showed the gallstones.  He  subsequently saw Dr. Johna Sheriff on February 10, 2008.  He does not have many  of the studies, but he obtained his records.  It was felt that he had  symptomatic cholelithiasis and they discussed essentially doing a  cholecystectomy pending Dr. Jamse Mead review of all his records.  However, he developed in his lower abdomen as well.  He also says that  his bowels have been somewhat slower than usual.  He does note that he  has been taking a proton pump inhibitor as well  as narcotics for his  intermittent pain.  At this time, the patient presented to the emergency  department because of all of these pain he was having at his abdomen.   On physical exam, his abdomen was soft with a well-healed midline scar.  There was some mild lower abdominal tenderness.  No masses or hernias  were present.  His white blood cell count was normal as well as all  other electrolytes and LFTs were normal except for a bilirubin slightly  elevated at 1.4.  At this time, a CT scan was also done which showed a  high-grade small bowel obstruction with transition point seen on the  central abdomen at the sacral level and decompressed terminal ileal  loops.  Also seen was cholelithiasis.  At this time, the patient was  admitted for rehydration, as well as for possible surgical intervention  depending on all these various lab results.   ADMITTING DIAGNOSES:  1. Chronic upper abdominal pain which may be secondary to  cholelithiasis.  2. Lower abdominal discomfort which may be secondary to constipation.  3. Chronic motility disorder.   HOSPITAL COURSE:  On hospital day #1, the patient was doing fairly well  except for still in quite a bit of pain.  His abdomen was soft and  diffusely tender.  He had decreased bowel sounds at this time especially  in his right lower quadrant.  At this time, based on his CT scan  results, the patient was kept n.p.o. and his treatment was later  discussed with Dr. Johna Sheriff.  After Dr. Jamse Mead discussion with Mr.  Parcher, they decided on proceeding with an exploratory laparotomy with  lysis of adhesions along with a cholecystectomy to help all of his  problems including his cholelithiasis, as well as his high-grade small  bowel obstruction.  On hospital day #3, the patient was scheduled for  surgery; however, it was cancelled at this time because Dr. Johna Sheriff was  sick.  At this time, the patient was continued n.p.o. and Toradol was  added for  pain.  On hospital day #4, the patient was taken to the  operating room for a laparotomy with lysis of adhesions and a  cholecystectomy with cholangiogram.  By postoperative day #1, the  patient had an NG tube, as well as a Foley.  He was alert and more  comfortable than he had been in any of his prior days.  On exam, his  abdomen was soft with minimal tenderness, but no bowel sounds.  At this  time, he was continued on his NG tube and informed that he needed to get  out of bed to walk.  By postoperative day #2, the patient was up in his  chair and states that his pain is getting a little bit better.  However,  on exam, the patient still had decreased bowel sounds and his G-tube was  continued on straight drain.  Over the next several days, the patient  continued with ambulation, IV pain medicine, and his G-tube to straight  drain due to a postoperative ileus.  This ileus lasted for many days.  Therefore, over many of the next several days, the patient was stable in  good condition and continued on the same course of his G-tube being to  straight drain.  On postoperative day #6, the patient says that he had  passed a little flatus the night prior and therefore, at this time, his  G-tube was clamped and he was allowed to have sips of liquids.  However,  the patient later began to get nauseated and had a bout of emesis.  At  that time, the patient's G-tube was put back to straight drain.  At this  time, the patient was given a PICC line and TNA was started due to a  prolonged postoperative ileus.  Over the next several days, the patient  was continued n.p.o. except for ice chips and his G-tube was continued  to straight drain.  By postoperative day 8, the patient's G-tube was  once again clamped on a trial basis.  However, he began to have nausea  and his G tube was once again unclamped and put back to straight drain.  At this time, due to a prolonged postoperative ileus, the patient was   tried on Reglan to see if this would help.  His bowels began to wake up.  By postoperative day 10, several of the staples were taken out of the  patient's incision; however, due to pain, half  of these had to be  removed on postoperative day 10 and half on postoperative day 11.  On  postoperative day 11, the patient was at this time tolerating sips of  clears and his G-tube was clamped.  He was also given an enema, as well  to see if this would help to evacuate his stools to see if at this time  his bowels would begin to wake up.  On postoperative days 12, the  patient was tolerating his clears and had 2 bowel movements on the prior  day.  He was at this time advanced to full liquids and informed that he  may only have different types of foods that I had described to him.  However, apparently some of the foods that were brought in from outside  had more of a substance to them and then had an episode of emesis.  At  this time, the patient was backed down to n.p.o. except for ice chips,  but his G-tube was continued clamped.  At this time, he had good bowel  sounds.  He at this time still continued on his TNAs due to the patient  being advanced and then backed off on his diet.  Also on this day, we  started erythromycin 250 mg IV q.6 h. to help with gastric emptying.  By  postoperative day 14, the patient had repeat abdominal x-rays which were  beginning to show an ileus versus partial small bowel obstruction  pattern again.  Therefore, at this time, we asked Dr. Evette Cristal with  Gastroenterology to come in and manage the patient to see if we could  figure out how to get the patient's abdomen to begin working.  At this  time, the patient's Phenergan was changed to Zofran to see if this would  help with some motility issues.  The patient's PCA was changed from  regular dose to reduced dose to help as well.  He was also encouraged to  ambulate ad lib if he has little time.  He was also given  several  milliequivalents of potassium and his Reglan was changed to 10 mg IV q.6  h. to help fight the patient's postoperative ileus.  By postoperative  day 15, the patient did have some air in his colon; however, he was  continued on ice chips awaiting the resolution of his postoperative  ileus.  The next day, the patient was advanced to a liquid diet and then  to full liquids as tolerated.  By postoperative day 17, the patient was  tolerating full liquids and his nausea was somewhat improved.  At this  time, we advanced his diet to a regular diet and discontinued his PCA  and started him on p.o. pain medicine.  On postoperative day 18, the  patient was tolerating a regular diet without any nausea.  At this time,  his PICC line and TNA was discontinued and the patient was having  flatus, as well as good bowel sounds.  At this time, the patient was  felt stable for discharge.   DISCHARGE DIAGNOSES:  1. Partial small bowel obstruction.  2. Cholelithiasis.  3. Status post exploratory laparotomy with lysis of adhesions and      cholecystectomy.  4. Prolonged postoperative ileus, which had resolved by this point.  5. Chronic nausea.  6. Chronic motility disorder.   DISCHARGE MEDICATIONS:  The patient was told that he may continue his  Protonix at home, but told to stop his MiraLax at this time.  He was  also given a prescription for Phenergan 25 mg one tablet by mouth every  6 hours as needed for nausea, as well as Vicodin 5/325 mg 1 tablet by  mouth every 4 hours as needed for pain.  He was told that he may resume  his MiraLax as needed, but to hold it while he was having diarrhea.   DISCHARGE INSTRUCTIONS:  The patient was told that he had no dietary  restrictions that he may increase his activity slowly and he may walk up  steps.  He was informed not to lift anything greater than 15 pounds for  approximately the next 4 weeks.  He is told not to drive for 1 week.  He  is informed  that the Steri-Strips on his abdomen are to be padded dry  when he is out of the shower and to not rub them.  He is also informed  to call our office if he begins to get a fever greater than 101.5, new  or severe abdominal pain or redness or pus-like drainage from his  incision.  He is also informed to return to Dr. Johna Sheriff in  approximately 1-2 weeks for followup.  At that time, a decision will be  made about removal of his G-tube.      Letha Cape, PA      Alfonse Ras, MD  Electronically Signed    KEO/MEDQ  D:  04/07/2008  T:  04/08/2008  Job:  [id number]   cc:   Lorne Skeens. Hoxworth, M.D.  Graylin Shiver, M.D.

## 2011-04-29 ENCOUNTER — Encounter: Payer: Self-pay | Admitting: Pulmonary Disease

## 2011-05-06 ENCOUNTER — Ambulatory Visit: Payer: BC Managed Care – PPO | Admitting: Pulmonary Disease

## 2011-05-08 ENCOUNTER — Other Ambulatory Visit: Payer: Self-pay | Admitting: Otolaryngology

## 2011-05-13 ENCOUNTER — Ambulatory Visit
Admission: RE | Admit: 2011-05-13 | Discharge: 2011-05-13 | Disposition: A | Discharge status: Home / Self Care | Payer: BC Managed Care – PPO | Source: Ambulatory Visit | Attending: Otolaryngology | Admitting: Otolaryngology

## 2011-06-24 ENCOUNTER — Telehealth: Payer: Self-pay | Admitting: Internal Medicine

## 2011-06-24 NOTE — Telephone Encounter (Signed)
Patient calling to report diarrhea, rectal burning and pain and feeling bad for the last few days. He cannot tell me if he has any bleeding.  He reports pain at rectum making sleeping or sitting difficult. He states he saw Dr. Leone Payor last year and was to have a ECOL which he did not set up.  He states he is seeing his urologist on Friday for burning with urination. Scheduled patient on 06/26/11 at 9:15 AM with Dr. Leone Payor. Last OV 09/16/10, hx IBS, SBO, anal fissure, pseudo obstruction, GERD, anxiety.

## 2011-06-24 NOTE — Telephone Encounter (Signed)
Left a message for patient to call me. 

## 2011-06-25 NOTE — Telephone Encounter (Signed)
Ok thanks 

## 2011-06-26 ENCOUNTER — Ambulatory Visit (INDEPENDENT_AMBULATORY_CARE_PROVIDER_SITE_OTHER): Payer: BC Managed Care – PPO | Admitting: Internal Medicine

## 2011-06-26 ENCOUNTER — Encounter: Payer: Self-pay | Admitting: Internal Medicine

## 2011-06-26 DIAGNOSIS — K589 Irritable bowel syndrome without diarrhea: Secondary | ICD-10-CM

## 2011-06-26 DIAGNOSIS — K219 Gastro-esophageal reflux disease without esophagitis: Secondary | ICD-10-CM

## 2011-06-26 DIAGNOSIS — R197 Diarrhea, unspecified: Secondary | ICD-10-CM

## 2011-06-26 MED ORDER — PEG-KCL-NACL-NASULF-NA ASC-C 100 G PO SOLR: 1.0000 | Freq: Once | ORAL | Status: DC

## 2011-06-26 MED ORDER — DICYCLOMINE HCL 20 MG PO TABS: ORAL_TABLET | ORAL | Status: DC

## 2011-06-26 NOTE — Assessment & Plan Note (Signed)
This is what he probably has with respect to his GI tract. Need to exclude other conditions. He may have a component of small bowel bacterial overgrowth as well. The colonoscopy will help me understand exactly what his anatomy as which were really not sure off since he had surgery in [place] years ago.

## 2011-06-26 NOTE — Progress Notes (Signed)
  Subjective:    Patient ID: [name], male    DOB: [birth year], 45 y.o.   MRN: [id number]  HPI41 yo man with 2 weeks of increasing stools and diarrhea and rectal burning. Mostly if not all post-prandial no matter what he eats. Not nocturnal. No blood seen. Says he turned in hemoccults earlier this year - seeing [facility] for PCP at [facility] now, switched from Dr. Perrin Maltese. Started Nexium bid for throat problems, saw Dr. Gerilyn Pilgrim - ENT. Told he had GERD. Sore throat still a problem, on second month of therapy.    Review of Systems Insomnia. Has gotten off nighttime hypnotics.  fatigue, took a week off work to rest. Son will be 78 years old Aug 1 - family life good. Objective:   Physical Exam  Constitutional: He appears well-developed and well-nourished.  Cardiovascular: Normal rate, regular rhythm and normal heart sounds.  Exam reveals no gallop and no friction rub.   No murmur heard. Pulmonary/Chest: Effort normal and breath sounds normal.  Abdominal: Soft. Bowel sounds are normal. He exhibits no mass. There is tenderness. There is no rebound and no guarding.       Mildly tender to deep palpation only overall benign abdomen there are well-healed surgical scars  Genitourinary: Rectum normal.       Scanty normal colored stool no mass prostate normal size Mild tenderness on rectal exam overall Anodermal  normal          Assessment & Plan:

## 2011-06-26 NOTE — Assessment & Plan Note (Signed)
He is having another flare. All the systems are most likely IBS, he has not had adequate exclusion of a microscopic colitis or other inflammatory bowel disease. He needs an EGD with small bowel biopsies to look for malabsorptive processes as well as a colonoscopy. He did not schedule these last year but agrees to it at this time. I will give him dicyclomine prescription for symptomatic relief at this time.  Risks benefits indications of procedures explained he understands and agrees to proceed through

## 2011-06-26 NOTE — Patient Instructions (Signed)
You have been scheduled for an Endoscopy/Colonoscopy with separate instructions given. Your prep kit has been sent to your pharmacy for you to pick up. Your prescription(s) has(have) been sent to your pharmacy for you to pick up.

## 2011-06-26 NOTE — Assessment & Plan Note (Signed)
He may have this. Currently on twice-daily Nexium for atypical GERD symptoms with sore throat. We'll see how he does. He has multiple body complaints over all and he certainly could have a somatization problem EGD is being done mainly for his diarrhea issues but lower look at the esophagus and upper GI tract

## 2011-07-17 ENCOUNTER — Encounter: Payer: Self-pay | Admitting: Internal Medicine

## 2011-07-17 ENCOUNTER — Ambulatory Visit (AMBULATORY_SURGERY_CENTER): Payer: BC Managed Care – PPO | Admitting: Internal Medicine

## 2011-07-17 VITALS — BP 106/62 | HR 82 | Temp 97.4°F | Resp 18 | Ht 73.0 in | Wt 194.0 lb

## 2011-07-17 DIAGNOSIS — R197 Diarrhea, unspecified: Secondary | ICD-10-CM

## 2011-07-17 DIAGNOSIS — K648 Other hemorrhoids: Secondary | ICD-10-CM

## 2011-07-17 DIAGNOSIS — K219 Gastro-esophageal reflux disease without esophagitis: Secondary | ICD-10-CM

## 2011-07-17 DIAGNOSIS — K589 Irritable bowel syndrome without diarrhea: Secondary | ICD-10-CM

## 2011-07-17 HISTORY — PX: COLONOSCOPY: SHX174

## 2011-07-17 HISTORY — PX: UPPER GASTROINTESTINAL ENDOSCOPY: SHX188

## 2011-07-17 MED ORDER — SODIUM CHLORIDE 0.9 % IV SOLN: 500.0000 mL | INTRAVENOUS | Status: DC

## 2011-07-17 NOTE — Progress Notes (Signed)
Patient said in admitting that he wished to be a HIPPA patient. No information was shared with the care partner only diet information and satefy restrictions for today. Patient spoke about coming in today for diarrhea in front of the care partner. The sealed envelope with patient information was handed to the patient.

## 2011-07-17 NOTE — Patient Instructions (Addendum)
The upper endoscopy and colonoscopy looked ok except for small hemorrhoids. No obvious problems. I did take some biopsies to see if there are problems at the microscopic level in the intestine. We will call with those results in 1-2 weeks and let you know of any other recommendations. It is still most likely that you have Irritable Bowel Syndrome.  Iva Boop, MD, [facility]   Discharge instructions given to patient in an envelope. (Blue and green handout)  Verbally went over dieting and safety instructions with caregiver.

## 2011-07-18 ENCOUNTER — Telehealth: Payer: Self-pay

## 2011-07-18 ENCOUNTER — Telehealth: Payer: Self-pay | Admitting: Internal Medicine

## 2011-07-18 ENCOUNTER — Telehealth: Payer: Self-pay | Admitting: *Deleted

## 2011-07-18 NOTE — Telephone Encounter (Signed)
NO ID MACHINE. NO MESSAGE LEFT.

## 2011-07-18 NOTE — Telephone Encounter (Signed)
Return phone call concerning results of procedure yesterday. Answered questions.

## 2011-07-27 ENCOUNTER — Encounter: Payer: Self-pay | Admitting: Internal Medicine

## 2011-07-27 NOTE — Progress Notes (Signed)
Quick Note:  1) [facility] - place colonoscopy recall for 12/2017 - age 45 - no letters 2) Office - call patient - all biopsies are ok and this supports diagnosis of IBS - continue dicyclomine if helping and may refill for 1 year - otherwise if still having symptoms schedule non-urgent REV to review ______

## 2011-08-18 ENCOUNTER — Telehealth: Payer: Self-pay | Admitting: Internal Medicine

## 2011-08-18 NOTE — Telephone Encounter (Signed)
Left message for patient to call back  

## 2011-08-18 NOTE — Telephone Encounter (Signed)
I discussed with him after hours - having bloating, gas and loose stools and was not able to sleep. No fever. Went to an urgent care facility in [facility] and had stool studies. I advised we would call him in AM to recheck and triage for appointment if needed. To stay on liquids-BRAT diet for now.

## 2011-08-19 ENCOUNTER — Ambulatory Visit (INDEPENDENT_AMBULATORY_CARE_PROVIDER_SITE_OTHER): Payer: BC Managed Care – PPO | Admitting: Internal Medicine

## 2011-08-19 ENCOUNTER — Ambulatory Visit (INDEPENDENT_AMBULATORY_CARE_PROVIDER_SITE_OTHER)
Admission: RE | Admit: 2011-08-19 | Discharge: 2011-08-19 | Disposition: A | Discharge status: Home / Self Care | Payer: BC Managed Care – PPO | Source: Ambulatory Visit | Attending: Internal Medicine | Admitting: Internal Medicine

## 2011-08-19 ENCOUNTER — Encounter: Payer: Self-pay | Admitting: Internal Medicine

## 2011-08-19 VITALS — BP 110/64 | HR 60 | Temp 98.6°F | Ht 73.0 in | Wt 195.0 lb

## 2011-08-19 DIAGNOSIS — R109 Unspecified abdominal pain: Secondary | ICD-10-CM

## 2011-08-19 DIAGNOSIS — K589 Irritable bowel syndrome without diarrhea: Secondary | ICD-10-CM

## 2011-08-19 MED ORDER — TRAMADOL HCL 50 MG PO TABS: 50.0000 mg | ORAL_TABLET | Freq: Four times a day (QID) | ORAL | Status: DC | PRN

## 2011-08-19 NOTE — Telephone Encounter (Signed)
Patient is no better than he was last night.  He will come in today and see Dr Leone Payor today at 4:00

## 2011-08-19 NOTE — Progress Notes (Signed)
  Subjective:    Patient ID: [name], male    DOB: Sep 15, 1966, 45 y.o.   MRN: [id number]  HPI Abdominal pain followed by diarrhea while at work 4-5 days ago. Progressive increase in symptoms and to [facility] (Dr. Tresa Endo) and hd labs and stool studies. Still in pain. Switched to liquids since Sat. Still on them. Pain is same - intensifies with food or liquid. No fever. Not sleeping due to this. Sore all over body also. Son has had a runny nose. Some co-workers may have been ill in same way. No nausea and vomiting. No stools x 2 days Stopped dicyclomine last 2 days - was not helping Review of Systems     Objective:   Physical Exam Tall, thin NAD Lungs clear Heart S1S2 no murmur Abd soft and nondistended, claims tenderness to deep palpation - BS + and increased       Assessment & Plan:

## 2011-08-19 NOTE — Patient Instructions (Addendum)
Please go to the basement upon leaving today to have your xrays done. Your prescription(s) has(have) been sent to your pharmacy for you to pick up. We will contact you with results and plans. Gradually start eating with a BRAT diet and advance as tolerated.

## 2011-08-20 ENCOUNTER — Encounter: Payer: Self-pay | Admitting: Internal Medicine

## 2011-08-20 NOTE — Progress Notes (Signed)
Quick Note:  Xray ok I am waiting on labs from urgent care - we need to be sure to get them by noon today and then will call patient back ______

## 2011-08-20 NOTE — Progress Notes (Signed)
Quick Note:  This is ok Labs and stool studies ok Overall consistent with a flare of IBS -  Use dicyclomine and/or tramadol (rxed yesterday) May use Imodium prn Follow-up as needed ______

## 2011-08-20 NOTE — Assessment & Plan Note (Addendum)
His current problem; IBS flare. It is possible it was triggered by some sort of gastrointestinal infection. I need to see the labs and stool studies from the urgent care visit. In the meantime for his abdominal pain, tramadol will be prescribed. We'll also have him get an abdominal film. This was performed prior to this dictation and shows no signs of small bowel obstruction. There is a large amount of stool in the colon, the meaning of which is not clear to me. It is probably of no clinical significance. It does not sound like he has an impaction.  KUB negative for obstruction though shows significant stool. Giardia screen, CMET, CBC, Stool screen for [facility] negative (EIA). UA did show 4-8 RBC and + blood (will tell him to see PCP)

## 2011-08-21 ENCOUNTER — Telehealth: Payer: Self-pay

## 2011-08-21 NOTE — Telephone Encounter (Signed)
I have left a message for the patient with Dr Marvell Fuller recommendations.  I will forward results and labs to Dr Perrin Maltese.

## 2011-08-21 NOTE — Telephone Encounter (Signed)
Message copied by Annett Fabian on Thu Aug 21, 2011  9:54 AM ------      Message from: Stan Head E      Created: Thu Aug 21, 2011  8:33 AM      Regarding: UA was + for blood - addendum to other notes       He had 4-8 RBC and + heme in UA 9/9.      He should check with PCP about this as could be a source of abdominal pain (possibilities are kidney stones or other problems, bladder problems)            Can forward my note and abd xray results to PCP (Please)

## 2011-09-01 LAB — CBC
HCT: 41.1
HCT: 46.3
HCT: 34.5 — ABNORMAL LOW
HCT: 36.2 — ABNORMAL LOW
Hemoglobin: 14.2
Hemoglobin: 15.9
Hemoglobin: 11.9 — ABNORMAL LOW
Hemoglobin: 12.5 — ABNORMAL LOW
Hemoglobin: 12.5 — ABNORMAL LOW
MCHC: 34.5
MCHC: 34.6
MCHC: 34
MCHC: 34.6
MCHC: 34.9
MCV: 85.1
MCV: 85.8
MCV: 86.4
Platelets: 188
Platelets: 229
Platelets: 305
RBC: 4.75
RBC: 4.05 — ABNORMAL LOW
RBC: 4.22
RDW: 13
RDW: 13.3
RDW: 12.8
WBC: 5.9

## 2011-09-01 LAB — COMPREHENSIVE METABOLIC PANEL
ALT: 28
ALT: 16
ALT: 36
AST: 26
AST: 13
AST: 17
Albumin: 2.9 — ABNORMAL LOW
Albumin: 2.8 — ABNORMAL LOW
Albumin: 3.1 — ABNORMAL LOW
Alkaline Phosphatase: 36 — ABNORMAL LOW
Alkaline Phosphatase: 45
Alkaline Phosphatase: 93
BUN: 8
BUN: 9
CO2: 25
Calcium: 8.3 — ABNORMAL LOW
Calcium: 8.5
Calcium: 9
Chloride: 102
Chloride: 104
Creatinine, Ser: 0.56
Creatinine, Ser: 0.73
GFR calc Af Amer: >60
GFR calc Af Amer: >60
GFR calc Af Amer: >60
GFR calc Af Amer: >60
GFR calc non Af Amer: >60
GFR calc non Af Amer: >60
Glucose, Bld: 102 — ABNORMAL HIGH
Glucose, Bld: 120 — ABNORMAL HIGH
Glucose, Bld: 141 — ABNORMAL HIGH
Glucose, Bld: 74
Potassium: 3.9
Potassium: 4.3
Potassium: 4.2
Sodium: 135
Sodium: 132 — ABNORMAL LOW
Sodium: 135
Total Bilirubin: 1.4 — ABNORMAL HIGH
Total Bilirubin: 0.4
Total Protein: 5.6 — ABNORMAL LOW
Total Protein: 5.6 — ABNORMAL LOW
Total Protein: 6.9

## 2011-09-01 LAB — URINALYSIS, ROUTINE W REFLEX MICROSCOPIC
Leukocytes, UA: NEGATIVE
Nitrite: NEGATIVE
Protein, ur: 30 — AB
Specific Gravity, Urine: 1.027
Urobilinogen, UA: 1

## 2011-09-01 LAB — DIFFERENTIAL
Basophils Absolute: 0
Basophils Relative: 0
Basophils Relative: 1
Eosinophils Absolute: 0
Eosinophils Absolute: 0.2
Eosinophils Relative: 0
Eosinophils Relative: 1
Eosinophils Relative: 3
Eosinophils Relative: 3
Lymphocytes Relative: 15
Lymphs Abs: 1.3
Lymphs Abs: 1.5
Monocytes Absolute: 0.3
Monocytes Absolute: 0.5
Monocytes Absolute: 0.4
Monocytes Absolute: 0.5
Monocytes Relative: 6
Monocytes Relative: 6
Neutro Abs: 3.8
Neutro Abs: 6.6

## 2011-09-01 LAB — PHOSPHORUS
Phosphorus: 2.3
Phosphorus: 3.5

## 2011-09-01 LAB — CHOLESTEROL, TOTAL
Cholesterol: 71
Cholesterol: 87

## 2011-09-01 LAB — BASIC METABOLIC PANEL
BUN: 4 — ABNORMAL LOW
BUN: 10
CO2: 29
CO2: 29
CO2: 22
CO2: 23
CO2: 25
CO2: 25
CO2: 26
CO2: 27
Calcium: 9
Calcium: 9.5
Calcium: 9.1
Chloride: 100
Chloride: 101
Chloride: 101
Chloride: 102
Chloride: 104
Chloride: 104
Chloride: 105
Creatinine, Ser: 0.91
Creatinine, Ser: 0.67
Creatinine, Ser: 0.78
Creatinine, Ser: 0.81
GFR calc Af Amer: >60
GFR calc Af Amer: >60
GFR calc Af Amer: >60
GFR calc Af Amer: >60
GFR calc Af Amer: >60
GFR calc Af Amer: >60
GFR calc non Af Amer: >60
Glucose, Bld: 124 — ABNORMAL HIGH
Glucose, Bld: 118 — ABNORMAL HIGH
Glucose, Bld: 122 — ABNORMAL HIGH
Glucose, Bld: 158 — ABNORMAL HIGH
Potassium: 2.8 — ABNORMAL LOW
Potassium: 3.7
Potassium: 3.9
Potassium: 3.9
Potassium: 4.3
Sodium: 140
Sodium: 135
Sodium: 135
Sodium: 136
Sodium: 136
Sodium: 137
Sodium: 138

## 2011-09-01 LAB — TRIGLYCERIDES
Triglycerides: 105
Triglycerides: 111
Triglycerides: 83
Triglycerides: 98

## 2011-09-01 LAB — URINE CULTURE

## 2011-09-01 LAB — LIPASE, BLOOD: Lipase: 21

## 2011-09-01 LAB — URINE MICROSCOPIC-ADD ON

## 2011-09-01 LAB — MAGNESIUM
Magnesium: 1.9
Magnesium: 2.1

## 2011-09-02 LAB — POCT HEMOGLOBIN-HEMACUE: Operator id: 268271

## 2011-09-25 NOTE — Telephone Encounter (Signed)
See other phone message  

## 2011-10-11 ENCOUNTER — Other Ambulatory Visit: Payer: Self-pay | Admitting: Internal Medicine

## 2011-11-08 ENCOUNTER — Encounter (HOSPITAL_COMMUNITY): Payer: Self-pay | Admitting: Emergency Medicine

## 2011-11-08 ENCOUNTER — Emergency Department (INDEPENDENT_AMBULATORY_CARE_PROVIDER_SITE_OTHER)
Admission: EM | Admit: 2011-11-08 | Discharge: 2011-11-08 | Disposition: A | Discharge status: Home / Self Care | Payer: BC Managed Care – PPO | Source: Home / Self Care | Attending: Family Medicine | Admitting: Family Medicine

## 2011-11-08 DIAGNOSIS — R6889 Other general symptoms and signs: Secondary | ICD-10-CM

## 2011-11-08 DIAGNOSIS — J111 Influenza due to unidentified influenza virus with other respiratory manifestations: Secondary | ICD-10-CM

## 2011-11-08 NOTE — ED Provider Notes (Signed)
History     CSN: [id number] Arrival date & time: 11/08/2011  8:44 PM   First MD Initiated Contact with Patient 11/08/11 1904      Chief Complaint  Patient presents with  . Fever    (Consider location/radiation/quality/duration/timing/severity/associated sxs/prior treatment) HPI Comments: [name] presents for evaluation of sore throat, coughing, fever up to 103 F yesterday, diarrhea, lethargy, poor appetite.  Is taking Augmentin already for another infection 10 days ago, been taking OTC preparations without relief        Patient is a 45 y.o. male presenting with fever. The history is provided by the patient.  Fever Primary symptoms of the febrile illness include fever, fatigue, cough and diarrhea. The current episode started yesterday. This is a new problem. The problem has not changed since onset. The onset of the illness is associated with recent antibiotic use.    Past Medical History  Diagnosis Date  . Postural lightheadedness   . Chest wall pain   . Obstructive sleep apnea     apnealink 10/23/10 AHI 5, Sp02 low 87%  . Headache   . Irritable bowel syndrome   . GERD (gastroesophageal reflux disease)   . Small bowel obstruction   . Diarrhea recurrent  . Cough     Past Surgical History  Procedure Date  . Colectomy 1994    segmental  . Laparotomy 2002, 2005, 2009  . Gastrostomy 2005    stamm  . Cholecystectomy 2009  . Sphincterotomy 2009    lateral internal  . Colonoscopy 07/17/2011    hemorrhoids, otherwise normal including terminal ileum and random colon biopises  . Upper gastrointestinal endoscopy 07/17/2011    normal, including duodenal biopsies    Family History  Problem Relation Age of Onset  . Cirrhosis Father     History  Substance Use Topics  . Smoking status: Never Smoker   . Smokeless tobacco: Never Used  . Alcohol Use: No      Review of Systems  Constitutional: Positive for fever and fatigue.  HENT: Positive for congestion and sore throat.    Eyes: Negative.   Respiratory: Positive for cough.   Cardiovascular: Negative.   Gastrointestinal: Positive for diarrhea.  Genitourinary: Negative.   Musculoskeletal: Negative.   Neurological: Negative.     Allergies  Review of patient's allergies indicates no known allergies.  Home Medications   Current Outpatient Rx  Name Route Sig Dispense Refill  . AMOXICILLIN-POT CLAVULANATE 875-125 MG PO TABS Oral Take 1 tablet by mouth 2 (two) times daily.      . GUAIFENESIN 400 MG PO TABS Oral Take 400 mg by mouth every 4 (four) hours.      . MELOXICAM 15 MG PO TABS Oral Take 15 mg by mouth daily.      Marland Kitchen OVER THE COUNTER MEDICATION  Throat lozenges.     . NYQUIL PO Oral Take by mouth.      Marland Kitchen DICYCLOMINE HCL 20 MG PO TABS  1 tablet oral 30 minutes before meals 90 tablet 0  . ESOMEPRAZOLE MAGNESIUM 40 MG PO CPDR Oral Take 40 mg by mouth daily before breakfast. As needed     . TRAMADOL HCL 50 MG PO TABS  TAKE 1 TABLET (50 MG TOTAL) BY MOUTH EVERY 6 (SIX) HOURS AS NEEDED FOR PAIN. 30 tablet 0    PATIENT NEEDS ADDITIONAL REFILLS    BP 151/91  Pulse 91  Temp(Src) 98.8 F (37.1 C) (Oral)  Resp 18  SpO2 97%  Physical Exam  Nursing note and vitals reviewed. Constitutional: He is oriented to person, place, and time. He appears well-developed and well-nourished.  HENT:  Head: Normocephalic and atraumatic.  Right Ear: External ear normal.  Left Ear: External ear normal.  Mouth/Throat: Oropharynx is clear and moist.  Eyes: EOM are normal.  Neck: Normal range of motion.  Cardiovascular: Normal rate and regular rhythm.   Pulmonary/Chest: Effort normal.  Musculoskeletal: Normal range of motion.  Neurological: He is alert and oriented to person, place, and time.  Skin: Skin is warm and dry.  Psychiatric: His behavior is normal.    ED Course  Procedures (including critical care time)  Labs Reviewed - No data to display No results found.   No diagnosis found.    MDM           Richardo Priest, MD 11/12/11 [phone]

## 2011-11-08 NOTE — ED Notes (Signed)
Sore throat, coughing, fever, diarrhea, lethargy, poor appetite.  Fever as high as 103.  Diarrhea episodes have slowed today

## 2013-05-07 ENCOUNTER — Inpatient Hospital Stay (HOSPITAL_COMMUNITY)
Admission: EM | Admit: 2013-05-07 | Discharge: 2013-05-10 | DRG: 180 | Disposition: A | Discharge status: Home / Self Care | Payer: BC Managed Care – PPO | Attending: Internal Medicine | Admitting: Internal Medicine

## 2013-05-07 ENCOUNTER — Emergency Department (HOSPITAL_COMMUNITY): Payer: BC Managed Care – PPO

## 2013-05-07 ENCOUNTER — Encounter (HOSPITAL_COMMUNITY): Payer: Self-pay | Admitting: Emergency Medicine

## 2013-05-07 DIAGNOSIS — R109 Unspecified abdominal pain: Secondary | ICD-10-CM

## 2013-05-07 DIAGNOSIS — R197 Diarrhea, unspecified: Secondary | ICD-10-CM

## 2013-05-07 DIAGNOSIS — K219 Gastro-esophageal reflux disease without esophagitis: Secondary | ICD-10-CM | POA: Diagnosis present

## 2013-05-07 DIAGNOSIS — G4733 Obstructive sleep apnea (adult) (pediatric): Secondary | ICD-10-CM | POA: Diagnosis present

## 2013-05-07 DIAGNOSIS — K589 Irritable bowel syndrome without diarrhea: Secondary | ICD-10-CM | POA: Diagnosis present

## 2013-05-07 DIAGNOSIS — K56609 Unspecified intestinal obstruction, unspecified as to partial versus complete obstruction: Secondary | ICD-10-CM

## 2013-05-07 DIAGNOSIS — R739 Hyperglycemia, unspecified: Secondary | ICD-10-CM

## 2013-05-07 DIAGNOSIS — Z9049 Acquired absence of other specified parts of digestive tract: Secondary | ICD-10-CM

## 2013-05-07 DIAGNOSIS — E876 Hypokalemia: Secondary | ICD-10-CM | POA: Diagnosis not present

## 2013-05-07 DIAGNOSIS — K56600 Partial intestinal obstruction, unspecified as to cause: Secondary | ICD-10-CM | POA: Diagnosis present

## 2013-05-07 DIAGNOSIS — Z79899 Other long term (current) drug therapy: Secondary | ICD-10-CM

## 2013-05-07 DIAGNOSIS — I959 Hypotension, unspecified: Secondary | ICD-10-CM | POA: Diagnosis not present

## 2013-05-07 LAB — COMPREHENSIVE METABOLIC PANEL
ALT: 27 U/L (ref 0–53)
AST: 25 U/L (ref 0–37)
Albumin: 4.7 g/dL (ref 3.5–5.2)
Alkaline Phosphatase: 81 U/L (ref 39–117)
CO2: 21 mEq/L (ref 19–32)
Chloride: 99 mEq/L (ref 96–112)
Creatinine, Ser: 0.91 mg/dL (ref 0.50–1.35)
GFR calc non Af Amer: >90 mL/min (ref >90)
Potassium: 3.7 mEq/L (ref 3.5–5.1)
Total Bilirubin: 1 mg/dL (ref 0.3–1.2)

## 2013-05-07 LAB — URINALYSIS, ROUTINE W REFLEX MICROSCOPIC
Bilirubin Urine: NEGATIVE
Glucose, UA: NEGATIVE mg/dL
Ketones, ur: NEGATIVE mg/dL
Protein, ur: NEGATIVE mg/dL
Urobilinogen, UA: 0.2 mg/dL (ref 0.0–1.0)
pH: 6.5 (ref 5.0–8.0)

## 2013-05-07 LAB — CBC WITH DIFFERENTIAL/PLATELET
Basophils Absolute: 0 10*3/uL (ref 0.0–0.1)
Basophils Relative: 0 % (ref 0–1)
HCT: 44 % (ref 39.0–52.0)
Hemoglobin: 16.1 g/dL (ref 13.0–17.0)
Lymphocytes Relative: 18 % (ref 12–46)
MCHC: 36.6 g/dL — ABNORMAL HIGH (ref 30.0–36.0)
Monocytes Absolute: 0.5 10*3/uL (ref 0.1–1.0)
Neutro Abs: 5.9 10*3/uL (ref 1.7–7.7)
Neutrophils Relative %: 75 % (ref 43–77)
RDW: 12.7 % (ref 11.5–15.5)
WBC: 7.8 10*3/uL (ref 4.0–10.5)

## 2013-05-07 LAB — POCT I-STAT TROPONIN I: Troponin i, poc: 0 ng/mL (ref 0.00–0.08)

## 2013-05-07 LAB — LACTIC ACID, PLASMA: Lactic Acid, Venous: 1.8 mmol/L (ref 0.5–2.2)

## 2013-05-07 LAB — URINE MICROSCOPIC-ADD ON

## 2013-05-07 LAB — LACTATE DEHYDROGENASE: LDH: 190 U/L (ref 94–250)

## 2013-05-07 LAB — CLOSTRIDIUM DIFFICILE BY PCR: Toxigenic C. Difficile by PCR: NEGATIVE

## 2013-05-07 MED ORDER — DEXTROSE-NACL 5-0.45 % IV SOLN
INTRAVENOUS | Status: DC
  Administered 2013-05-07 – 2013-05-09 (×6): via INTRAVENOUS

## 2013-05-07 MED ORDER — IOHEXOL 300 MG/ML  SOLN
20.0000 mL | INTRAMUSCULAR | Status: DC
  Administered 2013-05-07: 50 mL via ORAL

## 2013-05-07 MED ORDER — PHENOL 1.4 % MT LIQD
1.0000 | OROMUCOSAL | Status: DC | PRN
  Filled 2013-05-07: qty 177

## 2013-05-07 MED ORDER — PANTOPRAZOLE SODIUM 40 MG IV SOLR
40.0000 mg | Freq: Every day | INTRAVENOUS | Status: DC
  Administered 2013-05-07 – 2013-05-09 (×3): 40 mg via INTRAVENOUS
  Filled 2013-05-07 (×4): qty 40

## 2013-05-07 MED ORDER — ONDANSETRON HCL 4 MG/2ML IJ SOLN
4.0000 mg | Freq: Once | INTRAMUSCULAR | Status: AC
  Administered 2013-05-07: 4 mg via INTRAVENOUS
  Filled 2013-05-07: qty 2

## 2013-05-07 MED ORDER — MORPHINE SULFATE 2 MG/ML IJ SOLN: 1.0000 mg | INTRAMUSCULAR | Status: DC | PRN

## 2013-05-07 MED ORDER — SODIUM CHLORIDE 0.9 % IV BOLUS (SEPSIS)
1000.0000 mL | Freq: Once | INTRAVENOUS | Status: AC
  Administered 2013-05-07: 1000 mL via INTRAVENOUS

## 2013-05-07 MED ORDER — FAMOTIDINE IN NACL 20-0.9 MG/50ML-% IV SOLN
20.0000 mg | Freq: Once | INTRAVENOUS | Status: AC
  Administered 2013-05-07: 20 mg via INTRAVENOUS
  Filled 2013-05-07: qty 50

## 2013-05-07 MED ORDER — ONDANSETRON HCL 4 MG/2ML IJ SOLN: 4.0000 mg | Freq: Four times a day (QID) | INTRAMUSCULAR | Status: DC | PRN

## 2013-05-07 MED ORDER — ONDANSETRON HCL 4 MG PO TABS: 4.0000 mg | ORAL_TABLET | Freq: Four times a day (QID) | ORAL | Status: DC | PRN

## 2013-05-07 MED ORDER — MORPHINE SULFATE 4 MG/ML IJ SOLN
4.0000 mg | Freq: Once | INTRAMUSCULAR | Status: AC
  Administered 2013-05-07: 4 mg via INTRAVENOUS
  Filled 2013-05-07: qty 1

## 2013-05-07 MED ORDER — ENOXAPARIN SODIUM 40 MG/0.4ML ~~LOC~~ SOLN
40.0000 mg | SUBCUTANEOUS | Status: DC
  Administered 2013-05-07: 40 mg via SUBCUTANEOUS
  Filled 2013-05-07 (×4): qty 0.4

## 2013-05-07 MED ORDER — ONDANSETRON HCL 4 MG/2ML IJ SOLN
INTRAMUSCULAR | Status: AC
  Administered 2013-05-07: 4 mg via INTRAVENOUS
  Filled 2013-05-07: qty 2

## 2013-05-07 MED ORDER — KCL IN DEXTROSE-NACL 20-5-0.45 MEQ/L-%-% IV SOLN
Freq: Once | INTRAVENOUS | Status: AC
  Administered 2013-05-07: 08:00:00 via INTRAVENOUS
  Filled 2013-05-07: qty 1000

## 2013-05-07 MED ORDER — IOHEXOL 300 MG/ML  SOLN
100.0000 mL | Freq: Once | INTRAMUSCULAR | Status: AC | PRN
  Administered 2013-05-07: 100 mL via INTRAVENOUS

## 2013-05-07 MED ORDER — LIDOCAINE VISCOUS 2 % MT SOLN
20.0000 mL | Freq: Four times a day (QID) | OROMUCOSAL | Status: DC | PRN
  Filled 2013-05-07: qty 20

## 2013-05-07 MED ORDER — ONDANSETRON HCL 4 MG/2ML IJ SOLN: 4.0000 mg | Freq: Once | INTRAMUSCULAR | Status: AC

## 2013-05-07 NOTE — ED Notes (Signed)
Pt unable to void at this time urinal at bedside 

## 2013-05-07 NOTE — ED Notes (Signed)
CT called and informed pt. Done with contrast

## 2013-05-07 NOTE — ED Notes (Signed)
MD at bedside. Dr. Wyatt. 

## 2013-05-07 NOTE — H&P (Signed)
Hospital Admission Note Date: 05/07/2013  Patient name: [name] Medical record number: [id number] Date of birth: 1966/07/29 Age: 47 y.o. Gender: male PCP: GUEST, Loretha Stapler, MD  Medical Service: Internal Medicine Teaching Service   Attending physician: Dr. Josem Kaufmann  1st Contact: Dr. Sherrine Maples    Pager: [phone]  2nd Contact: Dr. Dorise Hiss    Pager: [phone]  After 5 pm or weekends:  1st Contact:      Pager: [phone]  2nd Contact:      Pager: [phone]    Chief Complaint:  Small bowel obstruction  History of Present Illness: 47yo M with PMH IBS, GERD, and multiple SBOs requiring lysis of adhesions, presents to the ED with acute onset abdominal pain, similar to his previous episodes of small bowel obstruction.   H/o laparotomy with bowel resection in '90 in [place]. Since that time, he has had multiple episodes of SBO 2/2 adhesions requiring multiple surgeries for lysis of adhesions. He presents today with nausea/vomiting and pain since Thursday, when he states he stopped eating and was drinking only a small amount of fluids. He states hat he is having diarrhea that is predominately water and is passing small amounts of flatus.   Meds: Current Facility-Administered Medications  Medication Dose Route Frequency Provider Last Rate Last Dose  . lidocaine (XYLOCAINE) 2 % viscous mouth solution 20 mL  20 mL Mouth/Throat Q6H PRN Doristine Mango, PA-C      . phenol (CHLORASEPTIC) mouth spray 1 spray  1 spray Mouth/Throat PRN Doristine Mango, PA-C        Allergies: Allergies as of 05/07/2013  . (No Known Allergies)   Past Medical History  Diagnosis Date  . Postural lightheadedness   . Chest wall pain   . Obstructive sleep apnea     apnealink 10/23/10 AHI 5, Sp02 low 87%  . Headache(784.0)   . Irritable bowel syndrome   . GERD (gastroesophageal reflux disease)   . Small bowel obstruction   . Diarrhea recurrent  . Cough    Past Surgical History  Procedure Laterality Date  . Colectomy   1994    segmental  . Laparotomy  2002, 2005, 2009  . Gastrostomy  2005    stamm  . Cholecystectomy  2009  . Sphincterotomy  2009    lateral internal  . Colonoscopy  07/17/2011    hemorrhoids, otherwise normal including terminal ileum and random colon biopises  . Upper gastrointestinal endoscopy  07/17/2011    normal, including duodenal biopsies   Family History  Problem Relation Age of Onset  . Cirrhosis Father    History   Social History  . Marital Status: Married    Spouse Name: N/A    Number of Children: N/A  . Years of Education: N/A   Occupational History  . Not on file.   Social History Main Topics  . Smoking status: Never Smoker   . Smokeless tobacco: Never Used  . Alcohol Use: No  . Drug Use: No  . Sexually Active: Not on file   Other Topics Concern  . Not on file   Social History Narrative   Occupation: Group Engineer, [employer] (deliveries)   Patient has never smoked.   Alcohol Use-no   Illicit Drug Use-no   Married-Divorced-Married again 2009- wife is in [place] with infant son    Review of Systems: A 10 point ROS was performed; pertinent positives and negatives were noted in the HPI   Physical Exam: Blood pressure 106/71, pulse 79, temperature 98.3 F (  36.8 C), temperature source Oral, resp. rate 16, SpO2 97.00%. General: Alert, well-developed, and cooperative on examination.  Head: Normocephalic and atraumatic.  Eyes: Pupils equal, round, and reactive to light, EOMI, no injection.  Mouth: Pharynx pink  Neck: Supple, full ROM Lungs: CTAB, normal respiratory effort, no accessory muscle use, no crackles, and no wheezes. Heart: Regular rate, regular rhythm, no murmur, no gallop, and no rub.  Abdomen: Soft, mild diffuse tenderness to palpation (after pain medication), non-distended, hypoactive bowel sounds, no guarding, no rebound tenderness, no organomegaly.  Msk: No joint swelling, warmth, or erythema.  Extremities: 2+ radial and DP pulses  bilaterally. No cyanosis, clubbing, edema Neurologic: Alert & oriented X3, cranial nerves II-XII intact, strength normal in all extremities Skin: Turgor normal and no rashes.  Psych: Normal mood and affect. Memory intact for recent and remote, normally interactive, good eye contact, not anxious appearing, and not depressed appearing.    Lab results: Basic Metabolic Panel:  Recent Labs  16/10/96 0155  NA 136  K 3.7  CL 99  CO2 21  GLUCOSE 151*  BUN 19  CREATININE 0.91  CALCIUM 10.2   Liver Function Tests:  Recent Labs  05/07/13 0155  AST 25  ALT 27  ALKPHOS 81  BILITOT 1.0  PROT 8.1  ALBUMIN 4.7    Recent Labs  05/07/13 0155  LIPASE 28   CBC:  Recent Labs  05/07/13 0155  WBC 7.8  NEUTROABS 5.9  HGB 16.1  HCT 44.0  MCV 81.6  PLT 210   Urinalysis:  Recent Labs  05/07/13 1010  COLORURINE YELLOW  LABSPEC >1.046*  PHURINE 6.5  GLUCOSEU NEGATIVE  HGBUR TRACE*  BILIRUBINUR NEGATIVE  KETONESUR NEGATIVE  PROTEINUR NEGATIVE  UROBILINOGEN 0.2  NITRITE NEGATIVE  LEUKOCYTESUR TRACE*     Imaging results:  Ct Abdomen Pelvis W Contrast  05/07/2013   *RADIOLOGY REPORT*  Clinical Data: Abdominal pain with nausea and diarrhea.  History of bowel obstructions to at times in the past.  History of previous abdominal surgery.  CT ABDOMEN AND PELVIS WITH CONTRAST  Technique:  Multidetector CT imaging of the abdomen and pelvis was performed following the standard protocol during bolus administration of intravenous contrast.  Contrast: OMNIPAQUE IOHEXOL 300 MG/ML  SOLN  Comparison: Current abdomen radiographs showing evidence of a small bowel obstruction.  Prior CT, 04/05/2010.  Findings: There is a partial small bowel obstruction.  The exact transition point is not defined with certainty but is suggested in the right lower quadrant.  This is in the region of the junction of the jejunum and ileum.  The more distal small bowel is decompressed.  The colon is normal in  caliber.  There is no bowel wall thickening.  There is no mesenteric inflammation.  Subsegmental atelectasis is noted at the lung bases.  The heart is normal in size.  The liver, spleen and pancreas are unremarkable. The gallbladder surgically absent.  No bile duct dilation.  No adrenal masses.  Tiny low density renal masses are noted, too small to fully characterize but likely cysts.  Kidneys are otherwise unremarkable.  Normal ureters and bladder.  No adenopathy.  No abnormal fluid collections.  No free air.  No significant bony abnormality.  IMPRESSION: Partial small bowel obstruction, mid grade, with the transition point is suggested in the right mid to lower quadrant.  No bowel wall thickening to suggest edema/ischemia.  No mesenteric inflammation.  No free air.  No other acute findings.   Original Report Authenticated By:  [name], M.D.   Dg Abd Portable 1v  05/07/2013   *RADIOLOGY REPORT*  Clinical Data: Severe abdominal pain, nausea  PORTABLE ABDOMEN - 1 VIEW  Comparison: 08/19/2011  Findings: There are dilated loops of small bowel.  Some air noted within colon however relative paucity of distal colonic gas. Multiple calcific densities projecting over the upper quadrants are nonspecific and may be a renal calculi. Midline calcific densities raise the potential for intrapancreatic.  Surgical clips right upper quadrant.  No acute osseous finding.  IMPRESSION: Dilated bowel loops with relative paucity of distal bowel gas raise concern for obstruction.   Original Report Authenticated By: [name], M.D.    Other results: EKG: Pending  Assessment & Plan by Problem:  Small bowel obstruction: H/o laparotomy with bowel resection in '90 in [place]. Since that time, he has had multiple episodes of SBO 2/2 adhesions requiring multiple surgeries for lysis of adhesions. Presents today with nausea/vomiting and pain since Thursday. On CT abdomen and pelvis, a partial SBO is noted with transition point  in the right mid to lower quadrant. He is still having watery stools and is having flatus. Surgery has been consulted and has ordered an NG tube. - Admit to IMTS to Med-Surg - NPO - D51/2@125  - NGT - Zofran 4mg  IV q6h PRN  - F/u with General Surgey , appreciate recommendations. - Morphine 1-4mg  q3h PRN pain, will decrease as pain improves  IBS/GERD: IBS is primarily diarrheal. Checked C. diff which is negative. For his GERD, he is on Nexium; will start Protonix in the hospital.  - Protonix 40mg  IV qhs  DVT PPx: Lovenox  Dispo: Disposition is deferred at this time, awaiting improvement of current medical problems. Anticipated discharge in approximately 1-3 day(s).   The patient does have a current PCP (GUEST, Loretha Stapler, MD), therefore will not be requiring [facility]PC follow-up after discharge.   The patient does not have transportation limitations that hinder transportation to clinic appointments.  Signed: Genelle Gather 05/07/2013, 12:37 PM

## 2013-05-07 NOTE — Consult Note (Signed)
Reason for Consult:SBO Referring Physician: Brandt Loosen, MD   [name] is an 47 y.o. male.  HPI: 47 yr old male who presents to [facility] with nausea, vomiting and abdominal pain.  Workup shows a SBO.  His past surgical history start in 1994 when he had a partial colectomy in the [place] due to what was felt to be a colonic dysmotility. He then developed a small bowel obstruction requiring exploratory laparotomy in November of 2002 as well as in June of 2005.  He has had recurrent abdominal pain since then and has been seeing Dr. Leone Payor as well as our office.  This current episode began approximately 20 hours ago and has been constant and increasingly severe. The pain is cramping and aching. He denies any exacerbating or relieving factors. He has been nauseated but has not vomited. His by mouth intake has been limited today. He reports approximately 5-6 nonbloody, watery diarrhea stools today.  He just vomited about 600cc's right before I came in the room and has been refusing a NGT.   Past Medical History  Diagnosis Date  . Postural lightheadedness   . Chest wall pain   . Obstructive sleep apnea     apnealink 10/23/10 AHI 5, Sp02 low 87%  . Headache(784.0)   . Irritable bowel syndrome   . GERD (gastroesophageal reflux disease)   . Small bowel obstruction   . Diarrhea recurrent  . Cough     Past Surgical History  Procedure Laterality Date  . Colectomy  1994    segmental  . Laparotomy  2002, 2005, 2009  . Gastrostomy  2005    stamm  . Cholecystectomy  2009  . Sphincterotomy  2009    lateral internal  . Colonoscopy  07/17/2011    hemorrhoids, otherwise normal including terminal ileum and random colon biopises  . Upper gastrointestinal endoscopy  07/17/2011    normal, including duodenal biopsies    Family History  Problem Relation Age of Onset  . Cirrhosis Father     Social History:  reports that he has never smoked. He has never used smokeless tobacco. He reports that he does  not drink alcohol or use illicit drugs.  Allergies: No Known Allergies  Medications: I have reviewed the patient's current medications.  Results for orders placed during the hospital encounter of 05/07/13 (from the past 48 hour(s))  CBC WITH DIFFERENTIAL     Status: Abnormal   Collection Time    05/07/13  1:55 AM      Result Value Range   WBC 7.8  4.0 - 10.5 K/uL   RBC 5.39  4.22 - 5.81 MIL/uL   Hemoglobin 16.1  13.0 - 17.0 g/dL   HCT 13.0  86.5 - 78.4 %   MCV 81.6  78.0 - 100.0 fL   MCH 29.9  26.0 - 34.0 pg   MCHC 36.6 (*) 30.0 - 36.0 g/dL   RDW 69.6  29.5 - 28.4 %   Platelets 210  150 - 400 K/uL   Neutrophils Relative % 75  43 - 77 %   Neutro Abs 5.9  1.7 - 7.7 K/uL   Lymphocytes Relative 18  12 - 46 %   Lymphs Abs 1.4  0.7 - 4.0 K/uL   Monocytes Relative 7  3 - 12 %   Monocytes Absolute 0.5  0.1 - 1.0 K/uL   Eosinophils Relative 0  0 - 5 %   Eosinophils Absolute 0.0  0.0 - 0.7 K/uL   Basophils Relative  0  0 - 1 %   Basophils Absolute 0.0  0.0 - 0.1 K/uL  COMPREHENSIVE METABOLIC PANEL     Status: Abnormal   Collection Time    05/07/13  1:55 AM      Result Value Range   Sodium 136  135 - 145 mEq/L   Potassium 3.7  3.5 - 5.1 mEq/L   Chloride 99  96 - 112 mEq/L   CO2 21  19 - 32 mEq/L   Glucose, Bld 151 (*) 70 - 99 mg/dL   BUN 19  6 - 23 mg/dL   Creatinine, Ser 1.61  0.50 - 1.35 mg/dL   Calcium 09.6  8.4 - 04.5 mg/dL   Total Protein 8.1  6.0 - 8.3 g/dL   Albumin 4.7  3.5 - 5.2 g/dL   AST 25  0 - 37 U/L   ALT 27  0 - 53 U/L   Alkaline Phosphatase 81  39 - 117 U/L   Total Bilirubin 1.0  0.3 - 1.2 mg/dL   GFR calc non Af Amer >90  >90 mL/min   GFR calc Af Amer >90  >90 mL/min   Comment:            The eGFR has been calculated     using the CKD EPI equation.     This calculation has not been     validated in all clinical     situations.     eGFR's persistently     <90 mL/min signify     possible Chronic Kidney Disease.  LIPASE, BLOOD     Status: None    Collection Time    05/07/13  1:55 AM      Result Value Range   Lipase 28  11 - 59 U/L  POCT I-STAT TROPONIN I     Status: None   Collection Time    05/07/13  2:00 AM      Result Value Range   Troponin i, poc 0.00  0.00 - 0.08 ng/mL   Comment 3            Comment: Due to the release kinetics of cTnI,     a negative result within the first hours     of the onset of symptoms does not rule out     myocardial infarction with certainty.     If myocardial infarction is still suspected,     repeat the test at appropriate intervals.  LACTATE DEHYDROGENASE     Status: None   Collection Time    05/07/13  4:01 AM      Result Value Range   LDH 190  94 - 250 U/L  LACTIC ACID, PLASMA     Status: None   Collection Time    05/07/13  6:20 AM      Result Value Range   Lactic Acid, Venous 1.8  0.5 - 2.2 mmol/L    Ct Abdomen Pelvis W Contrast  05/07/2013   *RADIOLOGY REPORT*  Clinical Data: Abdominal pain with nausea and diarrhea.  History of bowel obstructions to at times in the past.  History of previous abdominal surgery.  CT ABDOMEN AND PELVIS WITH CONTRAST  Technique:  Multidetector CT imaging of the abdomen and pelvis was performed following the standard protocol during bolus administration of intravenous contrast.  Contrast: OMNIPAQUE IOHEXOL 300 MG/ML  SOLN  Comparison: Current abdomen radiographs showing evidence of a small bowel obstruction.  Prior CT, 04/05/2010.  Findings: There is a partial  small bowel obstruction.  The exact transition point is not defined with certainty but is suggested in the right lower quadrant.  This is in the region of the junction of the jejunum and ileum.  The more distal small bowel is decompressed.  The colon is normal in caliber.  There is no bowel wall thickening.  There is no mesenteric inflammation.  Subsegmental atelectasis is noted at the lung bases.  The heart is normal in size.  The liver, spleen and pancreas are unremarkable. The gallbladder surgically  absent.  No bile duct dilation.  No adrenal masses.  Tiny low density renal masses are noted, too small to fully characterize but likely cysts.  Kidneys are otherwise unremarkable.  Normal ureters and bladder.  No adenopathy.  No abnormal fluid collections.  No free air.  No significant bony abnormality.  IMPRESSION: Partial small bowel obstruction, mid grade, with the transition point is suggested in the right mid to lower quadrant.  No bowel wall thickening to suggest edema/ischemia.  No mesenteric inflammation.  No free air.  No other acute findings.   Original Report Authenticated By: [name], M.D.   Dg Abd Portable 1v  05/07/2013   *RADIOLOGY REPORT*  Clinical Data: Severe abdominal pain, nausea  PORTABLE ABDOMEN - 1 VIEW  Comparison: 08/19/2011  Findings: There are dilated loops of small bowel.  Some air noted within colon however relative paucity of distal colonic gas. Multiple calcific densities projecting over the upper quadrants are nonspecific and may be a renal calculi. Midline calcific densities raise the potential for intrapancreatic.  Surgical clips right upper quadrant.  No acute osseous finding.  IMPRESSION: Dilated bowel loops with relative paucity of distal bowel gas raise concern for obstruction.   Original Report Authenticated By: [name], M.D.    Review of Systems  Gastrointestinal: Positive for nausea, vomiting, abdominal pain and diarrhea.  All other systems reviewed and are negative.   Blood pressure 113/81, pulse 92, temperature 98.3 F (36.8 C), temperature source Oral, resp. rate 16, SpO2 96.00%. Physical Exam  Constitutional: He is oriented to person, place, and time. He appears well-developed and well-nourished. No distress.  HENT:  Head: Normocephalic and atraumatic.  Eyes: Conjunctivae are normal. Pupils are equal, round, and reactive to light.  Neck: Normal range of motion. Neck supple.  Cardiovascular: Normal rate and regular rhythm.   Respiratory:  Effort normal and breath sounds normal.  GI: He exhibits distension. There is tenderness. There is guarding. There is no rebound.  No bowel sounds, multiple abdominal scars   Musculoskeletal: He exhibits no edema.  Neurological: He is alert and oriented to person, place, and time.  Skin: Skin is warm and dry.  Psychiatric: He has a normal mood and affect. His behavior is normal.    Assessment/Plan: Recurrent SBO: I had a long talk with him about a NGT and its importance.  I offered him vicous lidocaine and chloraseptic spray.  He seems to be more receptive to it now.  He is going to be admitted by medicine service.  He will need to be kept NPO and repeat films in AM.  NGT to LIS.  IVFs, follow labs, we will follow.   [name] 05/07/2013, 10:12 AM

## 2013-05-07 NOTE — ED Notes (Signed)
Per friend, pt c/o abdominal pain with nausea and diarrhea starting yesterday morning. Pt. Denies vomiting. Hx of bowel obstructions.

## 2013-05-07 NOTE — ED Notes (Signed)
Pt still unable to void

## 2013-05-07 NOTE — ED Provider Notes (Addendum)
History     CSN: [id number]  Arrival date & time 05/07/13  0121   First MD Initiated Contact with Patient 05/07/13 0234      Chief Complaint  Patient presents with  . Abdominal Pain    (Consider location/radiation/quality/duration/timing/severity/associated sxs/prior treatment) HPI This patient is a 47 year old [place] immigrant with a history of multiple small bowel obstructions some of which have resulted in laparotomy and LOA on several occasions. His medical and surgical history are difficult to piece together.   But, chart review indicates that his first abdominal surgery was performed in [place] many years ago for an ileus.   The patient presents today with increasingly severe abdominal pain which began approximately 20 hours ago and has been constant and increasingly severe. The pain is cramping and aching. He denies any exacerbating or relieving factors. He has been nauseated but has not vomited. His by mouth intake has been limited today. He reports approximately 5-6 nonbloody, watery diarrhea stools today.  He denies fever. He denies any recent antibiotics or hospital admissions.  The patient rates his pain 10 on a 0-10 scale. It is nonradiating. He says he feels his abdomen is distended he denies GU symptoms  Past Medical History  Diagnosis Date  . Postural lightheadedness   . Chest wall pain   . Obstructive sleep apnea     apnealink 10/23/10 AHI 5, Sp02 low 87%  . Headache(784.0)   . Irritable bowel syndrome   . GERD (gastroesophageal reflux disease)   . Small bowel obstruction   . Diarrhea recurrent  . Cough     Past Surgical History  Procedure Laterality Date  . Colectomy  1994    segmental  . Laparotomy  2002, 2005, 2009  . Gastrostomy  2005    stamm  . Cholecystectomy  2009  . Sphincterotomy  2009    lateral internal  . Colonoscopy  07/17/2011    hemorrhoids, otherwise normal including terminal ileum and random colon biopises  . Upper gastrointestinal  endoscopy  07/17/2011    normal, including duodenal biopsies    Family History  Problem Relation Age of Onset  . Cirrhosis Father     History  Substance Use Topics  . Smoking status: Never Smoker   . Smokeless tobacco: Never Used  . Alcohol Use: No      Review of Systems Gen: no weight loss, fevers, chills, night sweats Eyes: no discharge or drainage, no occular pain or visual changes Nose: no epistaxis or rhinorrhea Mouth: no dental pain, no sore throat Neck: no neck pain Lungs: no SOB, cough, wheezing CV: no chest pain, palpitations, dependent edema or orthopnea Abd: As per history of present illness, otherwise negative GU: no dysuria or gross hematuria MSK: no myalgias or arthralgias Neuro: no headache, no focal neurologic deficits Skin: no rash Psyche: negative.  Allergies  Review of patient's allergies indicates no known allergies.  Home Medications   Current Outpatient Rx  Name  Route  Sig  Dispense  Refill  . esomeprazole (NEXIUM) 40 MG capsule   Oral   Take 40 mg by mouth daily before breakfast. As needed          . ibuprofen (ADVIL,MOTRIN) 200 MG tablet   Oral   Take 400 mg by mouth every 6 (six) hours as needed for pain.           BP 108/69  Pulse 95  Temp(Src) 97.7 F (36.5 C)  Resp 20  SpO2 100%  Physical Exam  Gen: well developed and well nourished appearing, UNCOMFORTABLE APPEARING, MOANING. Head: NCAT Eyes: PERL, EOMI Nose: no epistaixis or rhinorrhea Mouth/throat: mucosa is moist and pink Neck: supple, no stridor Lungs: CTA B, no wheezing, rhonchi or rales Abd: MULTIPLE WELL HEALED INCISIONS, ABDOMEN IS MILDLY DISTENDED AND DIFFUSELY TENDER. Back: no ttp, no cva ttp Skin: no rashese, wnl Neuro: CN ii-xii grossly intact, no focal deficits Psyche; normal affect,  calm and cooperative.   ED Course  Procedures (including critical care time)  Results for orders placed during the hospital encounter of 05/07/13 (from the past 24  hour(s))  CBC WITH DIFFERENTIAL     Status: Abnormal   Collection Time    05/07/13  1:55 AM      Result Value Range   WBC 7.8  4.0 - 10.5 K/uL   RBC 5.39  4.22 - 5.81 MIL/uL   Hemoglobin 16.1  13.0 - 17.0 g/dL   HCT 62.1  30.8 - 65.7 %   MCV 81.6  78.0 - 100.0 fL   MCH 29.9  26.0 - 34.0 pg   MCHC 36.6 (*) 30.0 - 36.0 g/dL   RDW 84.6  96.2 - 95.2 %   Platelets 210  150 - 400 K/uL   Neutrophils Relative % 75  43 - 77 %   Neutro Abs 5.9  1.7 - 7.7 K/uL   Lymphocytes Relative 18  12 - 46 %   Lymphs Abs 1.4  0.7 - 4.0 K/uL   Monocytes Relative 7  3 - 12 %   Monocytes Absolute 0.5  0.1 - 1.0 K/uL   Eosinophils Relative 0  0 - 5 %   Eosinophils Absolute 0.0  0.0 - 0.7 K/uL   Basophils Relative 0  0 - 1 %   Basophils Absolute 0.0  0.0 - 0.1 K/uL  COMPREHENSIVE METABOLIC PANEL     Status: Abnormal   Collection Time    05/07/13  1:55 AM      Result Value Range   Sodium 136  135 - 145 mEq/L   Potassium 3.7  3.5 - 5.1 mEq/L   Chloride 99  96 - 112 mEq/L   CO2 21  19 - 32 mEq/L   Glucose, Bld 151 (*) 70 - 99 mg/dL   BUN 19  6 - 23 mg/dL   Creatinine, Ser 8.41  0.50 - 1.35 mg/dL   Calcium 32.4  8.4 - 40.1 mg/dL   Total Protein 8.1  6.0 - 8.3 g/dL   Albumin 4.7  3.5 - 5.2 g/dL   AST 25  0 - 37 U/L   ALT 27  0 - 53 U/L   Alkaline Phosphatase 81  39 - 117 U/L   Total Bilirubin 1.0  0.3 - 1.2 mg/dL   GFR calc non Af Amer >90  >90 mL/min   GFR calc Af Amer >90  >90 mL/min  LIPASE, BLOOD     Status: None   Collection Time    05/07/13  1:55 AM      Result Value Range   Lipase 28  11 - 59 U/L  POCT I-STAT TROPONIN I     Status: None   Collection Time    05/07/13  2:00 AM      Result Value Range   Troponin i, poc 0.00  0.00 - 0.08 ng/mL   Comment 3             Dg Abd Portable 1v  05/07/2013   *RADIOLOGY REPORT*  Clinical  Data: Severe abdominal pain, nausea  PORTABLE ABDOMEN - 1 VIEW  Comparison: 08/19/2011  Findings: There are dilated loops of small bowel.  Some air noted within  colon however relative paucity of distal colonic gas. Multiple calcific densities projecting over the upper quadrants are nonspecific and may be a renal calculi. Midline calcific densities raise the potential for intrapancreatic.  Surgical clips right upper quadrant.  No acute osseous finding.  IMPRESSION: Dilated bowel loops with relative paucity of distal bowel gas raise concern for obstruction.   Original Report Authenticated By: [name], M.D.        MDM  DDX: RECURRENT SBO, IBS, IBD, COLITIS, RENAL COLIC, PANCREATITIS, ENTERITIS.   History and abdominal films suggest SBO. Although the patient has not actually had any vomiting. We are managing symptomatically with IVF resuscitation and analgesia along with antiemetic and H2 blocker. We will place NGT to decompress dilated loops of SB. Anticipate admission.   CT scan suggests mid grade SBO. NGT ordered. I have placed a page to [facility] on call to discuss medical vs. Surgical admission. We will continue symptomatic management.         Brandt Loosen, MD 05/07/13 [phone]  9811: Case discussed with Dr. Lindie Spruce who agrees that the patient would be best served by medical management and surgical consultation at this time.   Brandt Loosen, MD 05/07/13 [phone]

## 2013-05-07 NOTE — Consult Note (Signed)
Needs NGT.  Patient initially refused.  Actively vomiting when seen in the ED.  May need surgery, but patient has had multiple laparotomies.  Marta Lamas. Gae Bon, MD, FACS [phone] [facility]

## 2013-05-07 NOTE — ED Notes (Addendum)
C/o nausea, diarrhea, chills, and generalized abd pain since Friday morning.  Denies vomiting.  Pt denies chest pain.  History of bowel obstructions.

## 2013-05-07 NOTE — ED Notes (Signed)
This nurse attempted to call report a second time to the floor RN but was unsuccessful.

## 2013-05-07 NOTE — ED Notes (Signed)
This nurse attempted to call report to the floor RN but was unsuccessful.

## 2013-05-07 NOTE — ED Notes (Signed)
PA Clance Boll at bedside.

## 2013-05-08 ENCOUNTER — Inpatient Hospital Stay (HOSPITAL_COMMUNITY): Payer: BC Managed Care – PPO

## 2013-05-08 DIAGNOSIS — K589 Irritable bowel syndrome without diarrhea: Secondary | ICD-10-CM

## 2013-05-08 LAB — COMPREHENSIVE METABOLIC PANEL
ALT: 26 U/L (ref 0–53)
AST: 19 U/L (ref 0–37)
Albumin: 3.5 g/dL (ref 3.5–5.2)
Alkaline Phosphatase: 59 U/L (ref 39–117)
BUN: 7 mg/dL (ref 6–23)
Potassium: 3.4 mEq/L — ABNORMAL LOW (ref 3.5–5.1)
Sodium: 137 mEq/L (ref 135–145)
Total Protein: 6.5 g/dL (ref 6.0–8.3)

## 2013-05-08 LAB — CBC
HCT: 39.2 % (ref 39.0–52.0)
MCH: 28.8 pg (ref 26.0–34.0)
MCV: 84.3 fL (ref 78.0–100.0)
RDW: 13 % (ref 11.5–15.5)
WBC: 4.1 10*3/uL (ref 4.0–10.5)

## 2013-05-08 NOTE — Progress Notes (Signed)
Partial small bowel obstruction  Subjective: Feels much better, vomited yesterday am and no nausea since then, one episode of diarrhea yesterday as well  Objective: Vital signs in last 24 hours: Temp:  [98.1 F (36.7 C)-98.5 F (36.9 C)] 98.5 F (36.9 C) (06/01 0545) Pulse Rate:  [57-92] 57 (06/01 0545) Resp:  [16-18] 17 (06/01 0545) BP: (96-123)/(59-81) 97/61 mmHg (06/01 0545) SpO2:  [96 %-98 %] 96 % (06/01 0545)    Intake/Output from previous day:   Intake/Output this shift:    General appearance: alert and cooperative GI: normal findings: soft, non-tender non-distended  Lab Results:  Results for orders placed during the hospital encounter of 05/07/13 (from the past 24 hour(s))  URINALYSIS, ROUTINE W REFLEX MICROSCOPIC     Status: Abnormal   Collection Time    05/07/13 10:10 AM      Result Value Range   Color, Urine YELLOW  YELLOW   APPearance CLEAR  CLEAR   Specific Gravity, Urine >1.046 (*) 1.005 - 1.030   pH 6.5  5.0 - 8.0   Glucose, UA NEGATIVE  NEGATIVE mg/dL   Hgb urine dipstick TRACE (*) NEGATIVE   Bilirubin Urine NEGATIVE  NEGATIVE   Ketones, ur NEGATIVE  NEGATIVE mg/dL   Protein, ur NEGATIVE  NEGATIVE mg/dL   Urobilinogen, UA 0.2  0.0 - 1.0 mg/dL   Nitrite NEGATIVE  NEGATIVE   Leukocytes, UA TRACE (*) NEGATIVE  URINE MICROSCOPIC-ADD ON     Status: Abnormal   Collection Time    05/07/13 10:10 AM      Result Value Range   Squamous Epithelial / LPF FEW (*) RARE   WBC, UA 3-6  <3 WBC/hpf   RBC / HPF 0-2  <3 RBC/hpf  CLOSTRIDIUM DIFFICILE BY PCR     Status: None   Collection Time    05/07/13 10:11 AM      Result Value Range   C difficile by pcr NEGATIVE  NEGATIVE  COMPREHENSIVE METABOLIC PANEL     Status: Abnormal   Collection Time    05/08/13  4:45 AM      Result Value Range   Sodium 137  135 - 145 mEq/L   Potassium 3.4 (*) 3.5 - 5.1 mEq/L   Chloride 102  96 - 112 mEq/L   CO2 27  19 - 32 mEq/L   Glucose, Bld 129 (*) 70 - 99 mg/dL   BUN 7  6 - 23  mg/dL   Creatinine, Ser 1.61  0.50 - 1.35 mg/dL   Calcium 9.1  8.4 - 09.6 mg/dL   Total Protein 6.5  6.0 - 8.3 g/dL   Albumin 3.5  3.5 - 5.2 g/dL   AST 19  0 - 37 U/L   ALT 26  0 - 53 U/L   Alkaline Phosphatase 59  39 - 117 U/L   Total Bilirubin 1.0  0.3 - 1.2 mg/dL   GFR calc non Af Amer >90  >90 mL/min   GFR calc Af Amer >90  >90 mL/min  CBC     Status: None   Collection Time    05/08/13  4:45 AM      Result Value Range   WBC 4.1  4.0 - 10.5 K/uL   RBC 4.65  4.22 - 5.81 MIL/uL   Hemoglobin 13.4  13.0 - 17.0 g/dL   HCT 04.5  40.9 - 81.1 %   MCV 84.3  78.0 - 100.0 fL   MCH 28.8  26.0 - 34.0 pg  MCHC 34.2  30.0 - 36.0 g/dL   RDW 16.1  09.6 - 04.5 %   Platelets 175  150 - 400 K/uL     Studies/Results Radiology     MEDS, Scheduled . enoxaparin (LOVENOX) injection  40 mg Subcutaneous Q24H  . pantoprazole (PROTONIX) IV  40 mg Intravenous QHS     Assessment: Partial small bowel obstruction Seems to be resolving clinically  Plan: axr in AM Cont NPO, pt still refusing NG Ambulate  LOS: 1 day    Steven Panda, MD [facility], [place] [phone]   05/08/2013 8:48 AM

## 2013-05-08 NOTE — H&P (Signed)
Internal Medicine Attending Admission Note Date: 05/08/2013  Patient name: [name] Medical record number: [id number] Date of birth: 03-19-1966 Age: 47 y.o. Gender: male  I saw and evaluated the patient. I reviewed the resident's note and I agree with the resident's findings and plan as documented in the resident's note.  Chief Complaint(s): Abdominal pain with nausea and vomiting x 3 days.  History - key components related to admission:  [name] is a 47 year old man with a history of multiple episodes of small bowel obstruction secondary to adhesions resulting in numerous episodes of small bowel obstruction and at least 4 episodes of small bowel obstruction requiring lysis of adhesions for resolution. He was in his usual state of health until Thursday when he developed the sudden onset of abdominal pain associated with some nausea and vomiting. He recognized this to be similar to his previous episodes and stopped eating and drinking. During this time he continued have mild diarrhea and pass small amounts of flatus. Nonetheless, his n.p.o. status did not result in improvement in his symptoms and he presented to the emergency department for further evaluation. He denies any fevers, shakes, chills, hematemesis, blood per rectum, or recent sick contacts. He's concerned about his wife who is due to deliver a baby within the next week but otherwise is without complaints.  Physical Exam - key components related to admission:  Filed Vitals:   05/07/13 1200 05/07/13 1258 05/07/13 2106 05/08/13 0545  BP: 106/71 103/59 96/60 97/61   Pulse: 79 65 58 57  Temp:  98.1 F (36.7 C) 98.3 F (36.8 C) 98.5 F (36.9 C)  TempSrc:   Oral Oral  Resp: 16 18 18 17   SpO2: 97% 97% 98% 96%   General: Well-developed, well-nourished, man lying comfortably in bed in no acute distress. Lungs: Clear to auscultation bilaterally without wheezes, rhonchi, or rales. Heart: Regular rate and rhythm without murmurs, rubs,  or gallops. Abdomen: Soft without guarding. Mild tenderness in the right lower quadrant with mild rebound. The left abdomen is nontender. Bowel sounds are hypoactive but not high pitched. Extremities: Without edema.  Lab results:  Basic Metabolic Panel:  Recent Labs  40/98/11 0155 05/08/13 0445  NA 136 137  K 3.7 3.4*  CL 99 102  CO2 21 27  GLUCOSE 151* 129*  BUN 19 7  CREATININE 0.91 0.85  CALCIUM 10.2 9.1   Liver Function Tests:  Recent Labs  05/07/13 0155 05/08/13 0445  AST 25 19  ALT 27 26  ALKPHOS 81 59  BILITOT 1.0 1.0  PROT 8.1 6.5  ALBUMIN 4.7 3.5    Recent Labs  05/07/13 0155  LIPASE 28   CBC:  Recent Labs  05/07/13 0155 05/08/13 0445  WBC 7.8 4.1  NEUTROABS 5.9  --   HGB 16.1 13.4  HCT 44.0 39.2  MCV 81.6 84.3  PLT 210 175   Urinalysis:  Specific gravity greater than 1.046, pH 6.5, hemoglobin trace, nitrite negative, leukocytes trace, 3-6 white blood cells per high-power field, 0-2 red blood cells per high-power field  Misc. Labs:  C. difficile by PCR: Negative  Imaging results:  Ct Abdomen Pelvis W Contrast  05/07/2013   *RADIOLOGY REPORT*  Clinical Data: Abdominal pain with nausea and diarrhea.  History of bowel obstructions to at times in the past.  History of previous abdominal surgery.  CT ABDOMEN AND PELVIS WITH CONTRAST  Technique:  Multidetector CT imaging of the abdomen and pelvis was performed following the standard protocol during bolus administration of  intravenous contrast.  Contrast: OMNIPAQUE IOHEXOL 300 MG/ML  SOLN  Comparison: Current abdomen radiographs showing evidence of a small bowel obstruction.  Prior CT, 04/05/2010.  Findings: There is a partial small bowel obstruction.  The exact transition point is not defined with certainty but is suggested in the right lower quadrant.  This is in the region of the junction of the jejunum and ileum.  The more distal small bowel is decompressed.  The colon is normal in caliber.   There is no bowel wall thickening.  There is no mesenteric inflammation.  Subsegmental atelectasis is noted at the lung bases.  The heart is normal in size.  The liver, spleen and pancreas are unremarkable. The gallbladder surgically absent.  No bile duct dilation.  No adrenal masses.  Tiny low density renal masses are noted, too small to fully characterize but likely cysts.  Kidneys are otherwise unremarkable.  Normal ureters and bladder.  No adenopathy.  No abnormal fluid collections.  No free air.  No significant bony abnormality.  IMPRESSION: Partial small bowel obstruction, mid grade, with the transition point is suggested in the right mid to lower quadrant.  No bowel wall thickening to suggest edema/ischemia.  No mesenteric inflammation.  No free air.  No other acute findings.   Original Report Authenticated By: [name], M.D.   Dg Abd Portable 1v  05/08/2013   *RADIOLOGY REPORT*  Clinical Data: Small bowel obstruction  PORTABLE ABDOMEN - 1 VIEW  Comparison:   the previous day's study  Findings: Several dilated small bowel loops in the mid abdomen, stable in number and degree of dilatation since previous exam.  The colon is decompressed.  Bilateral pelvic phleboliths.  Regional bones unremarkable.  IMPRESSION:  1.  Stable appearance of mid small bowel obstruction.   Original Report Authenticated By: [name], MD   Dg Abd Portable 1v  05/07/2013   *RADIOLOGY REPORT*  Clinical Data: Severe abdominal pain, nausea  PORTABLE ABDOMEN - 1 VIEW  Comparison: 08/19/2011  Findings: There are dilated loops of small bowel.  Some air noted within colon however relative paucity of distal colonic gas. Multiple calcific densities projecting over the upper quadrants are nonspecific and may be a renal calculi. Midline calcific densities raise the potential for intrapancreatic.  Surgical clips right upper quadrant.  No acute osseous finding.  IMPRESSION: Dilated bowel loops with relative paucity of distal bowel gas  raise concern for obstruction.   Original Report Authenticated By: [name], M.D.   Other results:  EKG: Normal sinus rhythm at 90 beats per minute, normal axis, right bundle branch block, 1 mm ST elevation in V4 through V6, likely early repolarization.  Assessment & Plan by Problem:  [name] is a 47 year old man with a history of multiple small bowel obstruction episodes, some requiring lyses of adhesions, who presents with the acute onset of abdominal pain, nausea, and vomiting similar to his previous episodes of small bowel obstruction. He attempted n.p.o. status at home for 2 days without resolution of his symptoms. Radiography demonstrates a likely cut point in the right lower quadrant in the area between the jejunum and ileum. Overnight he has passed no flatus and had no more diarrhea. He has only mild nausea at this time with no vomiting. He is otherwise unchanged.  1) Small bowel obstruction: We will continue conservative and supportive measures in managing his small bowel obstruction. He is n.p.o., hydrated with intravenous fluids, and provided with Zofran, IV Protonix, and as needed  morphine for pain. He was encouraged to ambulate around the halls, and says he did so last night. We will perform serial abdominal x-rays to assess for improvement along with improvement in his symptomatology. We appreciate surgery following along.  2) Disposition: Discharge is pending resolution of his small bowel obstruction. If after several days of conservative therapy he fails to resolve he may then be a candidate for surgery and likely lysis of adhesions.

## 2013-05-08 NOTE — Progress Notes (Signed)
Subjective: Per pt, abdominal pain has greatly improved and no nausea or vomiting. No flatus or stools since yesterday.  Objective: Vital signs in last 24 hours: Filed Vitals:   05/07/13 1258 05/07/13 2106 05/08/13 0545 05/08/13 1100  BP: 103/59 96/60 97/61  95/64  Pulse: 65 58 57 65  Temp: 98.1 F (36.7 C) 98.3 F (36.8 C) 98.5 F (36.9 C) 97.9 F (36.6 C)  TempSrc:  Oral Oral Oral  Resp: 18 18 17 17   SpO2: 97% 98% 96% 97%   Weight change:   Intake/Output Summary (Last 24 hours) at 05/08/13 1317 Last data filed at 05/08/13 1230  Gross per 24 hour  Intake      0 ml  Output      0 ml  Net      0 ml   Vitals reviewed. General: Resting in bed, NAD HEENT: PERRL, EOMI, no scleral icterus Cardiac: RRR, no rubs, murmurs or gallops Pulm: Clear to auscultation bilaterally, no wheezes, rales, or rhonchi Abd: Soft, diffuse mild TTP, nondistended, hypoactive bowel sounds Ext: warm and well perfused, no pedal edema Neuro: alert and oriented X3, cranial nerves II-XII grossly intact, strength and sensation to light touch equal in bilateral upper and lower extremities  Lab Results: Basic Metabolic Panel:  Recent Labs Lab 05/07/13 0155 05/08/13 0445  NA 136 137  K 3.7 3.4*  CL 99 102  CO2 21 27  GLUCOSE 151* 129*  BUN 19 7  CREATININE 0.91 0.85  CALCIUM 10.2 9.1   Liver Function Tests:  Recent Labs Lab 05/07/13 0155 05/08/13 0445  AST 25 19  ALT 27 26  ALKPHOS 81 59  BILITOT 1.0 1.0  PROT 8.1 6.5  ALBUMIN 4.7 3.5    Recent Labs Lab 05/07/13 0155  LIPASE 28   CBC:  Recent Labs Lab 05/07/13 0155 05/08/13 0445  WBC 7.8 4.1  NEUTROABS 5.9  --   HGB 16.1 13.4  HCT 44.0 39.2  MCV 81.6 84.3  PLT 210 175   Urinalysis:  Recent Labs Lab 05/07/13 1010  COLORURINE YELLOW  LABSPEC >1.046*  PHURINE 6.5  GLUCOSEU NEGATIVE  HGBUR TRACE*  BILIRUBINUR NEGATIVE  KETONESUR NEGATIVE  PROTEINUR NEGATIVE  UROBILINOGEN 0.2  NITRITE NEGATIVE  LEUKOCYTESUR  TRACE*    Micro Results: Recent Results (from the past 240 hour(s))  CLOSTRIDIUM DIFFICILE BY PCR     Status: None   Collection Time    05/07/13 10:11 AM      Result Value Range Status   C difficile by pcr NEGATIVE  NEGATIVE Final   Studies/Results: Ct Abdomen Pelvis W Contrast  05/07/2013   *RADIOLOGY REPORT*  Clinical Data: Abdominal pain with nausea and diarrhea.  History of bowel obstructions to at times in the past.  History of previous abdominal surgery.  CT ABDOMEN AND PELVIS WITH CONTRAST  Technique:  Multidetector CT imaging of the abdomen and pelvis was performed following the standard protocol during bolus administration of intravenous contrast.  Contrast: OMNIPAQUE IOHEXOL 300 MG/ML  SOLN  Comparison: Current abdomen radiographs showing evidence of a small bowel obstruction.  Prior CT, 04/05/2010.  Findings: There is a partial small bowel obstruction.  The exact transition point is not defined with certainty but is suggested in the right lower quadrant.  This is in the region of the junction of the jejunum and ileum.  The more distal small bowel is decompressed.  The colon is normal in caliber.  There is no bowel wall thickening.  There is no mesenteric  inflammation.  Subsegmental atelectasis is noted at the lung bases.  The heart is normal in size.  The liver, spleen and pancreas are unremarkable. The gallbladder surgically absent.  No bile duct dilation.  No adrenal masses.  Tiny low density renal masses are noted, too small to fully characterize but likely cysts.  Kidneys are otherwise unremarkable.  Normal ureters and bladder.  No adenopathy.  No abnormal fluid collections.  No free air.  No significant bony abnormality.  IMPRESSION: Partial small bowel obstruction, mid grade, with the transition point is suggested in the right mid to lower quadrant.  No bowel wall thickening to suggest edema/ischemia.  No mesenteric inflammation.  No free air.  No other acute findings.   Original  Report Authenticated By: [name], M.D.   Dg Abd Portable 1v  05/08/2013   *RADIOLOGY REPORT*  Clinical Data: Small bowel obstruction  PORTABLE ABDOMEN - 1 VIEW  Comparison:   the previous day's study  Findings: Several dilated small bowel loops in the mid abdomen, stable in number and degree of dilatation since previous exam.  The colon is decompressed.  Bilateral pelvic phleboliths.  Regional bones unremarkable.  IMPRESSION:  1.  Stable appearance of mid small bowel obstruction.   Original Report Authenticated By: [name], MD   Dg Abd Portable 1v  05/07/2013   *RADIOLOGY REPORT*  Clinical Data: Severe abdominal pain, nausea  PORTABLE ABDOMEN - 1 VIEW  Comparison: 08/19/2011  Findings: There are dilated loops of small bowel.  Some air noted within colon however relative paucity of distal colonic gas. Multiple calcific densities projecting over the upper quadrants are nonspecific and may be a renal calculi. Midline calcific densities raise the potential for intrapancreatic.  Surgical clips right upper quadrant.  No acute osseous finding.  IMPRESSION: Dilated bowel loops with relative paucity of distal bowel gas raise concern for obstruction.   Original Report Authenticated By: [name], M.D.   Medications: I have reviewed the patient's current medications. Scheduled Meds: . enoxaparin (LOVENOX) injection  40 mg Subcutaneous Q24H  . pantoprazole (PROTONIX) IV  40 mg Intravenous QHS   Continuous Infusions: . dextrose 5 % and 0.45% NaCl 125 mL/hr at 05/08/13 0628   PRN Meds:.lidocaine, morphine injection, ondansetron (ZOFRAN) IV, ondansetron, phenol  Assessment/Plan: 47yo M with PMH IBS, GERD, and multiple SBOs requiring lysis of adhesions, presents to the ED with acute onset abdominal pain, similar to his previous episodes of small bowel obstruction.   Small bowel obstruction:  H/o laparotomy with bowel resection in '90 in [place]. Since that time, he has had multiple episodes of  SBO 2/2 adhesions requiring multiple surgeries for lysis of adhesions. Presents today with nausea/vomiting and pain since Thursday. On CT abdomen and pelvis, a partial SBO is noted with transition point in the right mid to lower quadrant. He is still having watery stools and is having flatus. Surgery has been consulted and has ordered an NG tube which will be placed if he vomits again. Unfortunately he has not had any further bowel movements or flatus today. Abd series looks unchanged, but his abdomen is soft with only mild tenderness. Will hold the course for now. - NPO  - D51/2@125   - NGT if vomits - Ambulate as much as tolerated - Zofran 4mg  IV q6h PRN  - F/u with General Surgey , appreciate recommendations.  - Morphine 1-4mg  q3h PRN pain, will decrease as pain improves   Hypokalemia:  Potassium 3.4 this morning, likely from NPO status.  Will replace. - IV KCl - AM BMP  IBS/GERD:  IBS is primarily diarrheal. Checked C. diff which is negative. For his GERD, he is on Nexium; will start Protonix in the hospital.  - Protonix 40mg  IV qhs   DVT PPx:  Lovenox   Dispo: Disposition is deferred at this time, awaiting improvement of current medical problems.  Anticipated discharge in approximately 1-3 day(s).   The patient does have a current PCP (GUEST, Loretha Stapler, MD), therefore will not be requiring OPC follow-up after discharge.   The patient does not have transportation limitations that hinder transportation to clinic appointments.  .Services Needed at time of discharge: Y = Yes, Blank = No PT:   OT:   RN:   Equipment:   Other:     LOS: 1 day   Genelle Gather 05/08/2013, 1:17 PM

## 2013-05-09 ENCOUNTER — Encounter (HOSPITAL_COMMUNITY): Payer: Self-pay | Admitting: General Practice

## 2013-05-09 ENCOUNTER — Inpatient Hospital Stay (HOSPITAL_COMMUNITY): Payer: BC Managed Care – PPO

## 2013-05-09 DIAGNOSIS — E876 Hypokalemia: Secondary | ICD-10-CM

## 2013-05-09 LAB — BASIC METABOLIC PANEL
BUN: 6 mg/dL (ref 6–23)
CO2: 27 mEq/L (ref 19–32)
Chloride: 102 mEq/L (ref 96–112)
Creatinine, Ser: 0.89 mg/dL (ref 0.50–1.35)
GFR calc Af Amer: >90 mL/min (ref >90)

## 2013-05-09 MED ORDER — CHLORHEXIDINE GLUCONATE 0.12 % MT SOLN
15.0000 mL | Freq: Two times a day (BID) | OROMUCOSAL | Status: DC
  Filled 2013-05-09: qty 15

## 2013-05-09 MED ORDER — POTASSIUM CL IN DEXTROSE 5% 20 MEQ/L IV SOLN
20.0000 meq | INTRAVENOUS | Status: DC
  Administered 2013-05-09 – 2013-05-10 (×3): 20 meq via INTRAVENOUS
  Filled 2013-05-09 (×7): qty 1000

## 2013-05-09 MED ORDER — WHITE PETROLATUM GEL
Status: AC
  Filled 2013-05-09: qty 5

## 2013-05-09 MED ORDER — BIOTENE DRY MOUTH MT LIQD: 15.0000 mL | Freq: Two times a day (BID) | OROMUCOSAL | Status: DC

## 2013-05-09 NOTE — Progress Notes (Signed)
  Subjective: Pt states he feels a lot better today.  Passing some gas, no BM.  No nausea/vomiting.  Ambulating through the halls.  Pain well controlled.  NPO currently.    Objective: Vital signs in last 24 hours: Temp:  [97.7 F (36.5 C)-98.3 F (36.8 C)] 97.9 F (36.6 C) (06/02 0525) Pulse Rate:  [57-67] 57 (06/02 0525) Resp:  [17-18] 18 (06/02 0525) BP: (95-108)/(52-64) 107/61 mmHg (06/02 0525) SpO2:  [97 %-100 %] 99 % (06/02 0525) Last BM Date: 05/06/13  Intake/Output from previous day: 06/01 0701 - 06/02 0700 In: 4889.6 [I.V.:4889.6] Out: -  Intake/Output this shift:    PE: Gen:  Alert, NAD, pleasant Abd: Soft, mildly tender in Right side of abdomen, mild distension and abdomen tympanic, +BS, no HSM, old large midline scar with hypopigmented skin   Lab Results:   Recent Labs  05/07/13 0155 05/08/13 0445  WBC 7.8 4.1  HGB 16.1 13.4  HCT 44.0 39.2  PLT 210 175   BMET  Recent Labs  05/08/13 0445 05/09/13 0640  NA 137 139  K 3.4* 3.4*  CL 102 102  CO2 27 27  GLUCOSE 129* 120*  BUN 7 6  CREATININE 0.85 0.89  CALCIUM 9.1 9.0   PT/INR No results found for this basename: LABPROT, INR,  in the last 72 hours CMP     Component Value Date/Time   NA 139 05/09/2013 0640   K 3.4* 05/09/2013 0640   CL 102 05/09/2013 0640   CO2 27 05/09/2013 0640   GLUCOSE 120* 05/09/2013 0640   BUN 6 05/09/2013 0640   CREATININE 0.89 05/09/2013 0640   CALCIUM 9.0 05/09/2013 0640   PROT 6.5 05/08/2013 0445   ALBUMIN 3.5 05/08/2013 0445   AST 19 05/08/2013 0445   ALT 26 05/08/2013 0445   ALKPHOS 59 05/08/2013 0445   BILITOT 1.0 05/08/2013 0445   GFRNONAA >90 05/09/2013 0640   GFRAA >90 05/09/2013 0640   Lipase     Component Value Date/Time   LIPASE 28 05/07/2013 0155       Studies/Results: Dg Abd Portable 1v  05/09/2013   *RADIOLOGY REPORT*  Clinical Data: Bowel obstruction.  PORTABLE ABDOMEN - 1 VIEW  Comparison: Most recent 05/08/2013  Findings: Continued distention of small bowel loops  with a paucity of colonic gas.  The caliber of small bowel loops may be slightly increased from yesterday's exam.  No upright film is obtained.  IMPRESSION: Suspected slight worsening of small bowel obstruction.   Original Report Authenticated By: [name], M.D.   Dg Abd Portable 1v  05/08/2013   *RADIOLOGY REPORT*  Clinical Data: Small bowel obstruction  PORTABLE ABDOMEN - 1 VIEW  Comparison:   the previous day's study  Findings: Several dilated small bowel loops in the mid abdomen, stable in number and degree of dilatation since previous exam.  The colon is decompressed.  Bilateral pelvic phleboliths.  Regional bones unremarkable.  IMPRESSION:  1.  Stable appearance of mid small bowel obstruction.   Original Report Authenticated By: [name], MD    Anti-infectives: Anti-infectives   None       Assessment/Plan Recurrent PSBO - clinically improving, but KUB worse today 1.  Continue conservative treatment, NPO, IVF, pain and nausea control 2.  Ambulation and IS 3.  SCD's and lovenox  Hypokalemia - 3.4 switched to D5W with K+    LOS: 2 days    DORT, Denyse Fillion 05/09/2013, 9:07 AM Pager: [phone]

## 2013-05-09 NOTE — Progress Notes (Signed)
Internal Medicine Attending  Date: 05/09/2013  Patient name: [name] Medical record number: [id number] Date of birth: Sep 01, 1966 Age: 47 y.o. Gender: male  I saw and evaluated the patient and discussed his care on a.m. rounds with house staff. I reviewed the resident's note by Dr. Shirlee Latch and I agree with the resident's findings and plans as documented in her note.

## 2013-05-09 NOTE — Progress Notes (Signed)
Subjective: Patient is passing gas 4-5 times.  He has not had a bowel movement.  Denies feeling nauseated or vomiting (last vomitus was Saturday prior to admission).  He has been NPO x 4 days.  He reports he has had a least 3 operations in the past for SBO by Dr. Johna Sheriff.   Objective: Vital signs in last 24 hours: Filed Vitals:   05/08/13 1415 05/08/13 2150 05/09/13 0525 05/09/13 0900  BP: 95/52 108/62 107/61   Pulse: 61 67 57   Temp: 98.3 F (36.8 C) 97.7 F (36.5 C) 97.9 F (36.6 C)   TempSrc:  Oral    Resp: 18 18 18    Height:    6' (1.829 m)  Weight:    190 lb 11.2 oz (86.5 kg)  SpO2: 98% 100% 99%    Weight change:   Intake/Output Summary (Last 24 hours) at 05/09/13 1115 Last data filed at 05/09/13 0700  Gross per 24 hour  Intake 4889.59 ml  Output      0 ml  Net 4889.59 ml   Vitals reviewed. General: resting in bed, NAD, alert and oriented x 3  HEENT: /at, no scleral icterus Cardiac: RRR, no rubs, murmurs or gallops Pulm: clear to auscultation bilaterally, no wheezes, rales, or rhonchi Abd: soft, mild ttp RUQ, LLQ, nondistended, hypoactive BS present Ext: warm and well perfused, no pedal edema Neuro: alert and oriented X3, grossly neurologically intact, moving all 4 extremities    Lab Results: Basic Metabolic Panel:  Recent Labs Lab 05/08/13 0445 05/09/13 0640  NA 137 139  K 3.4* 3.4*  CL 102 102  CO2 27 27  GLUCOSE 129* 120*  BUN 7 6  CREATININE 0.85 0.89  CALCIUM 9.1 9.0   Liver Function Tests:  Recent Labs Lab 05/07/13 0155 05/08/13 0445  AST 25 19  ALT 27 26  ALKPHOS 81 59  BILITOT 1.0 1.0  PROT 8.1 6.5  ALBUMIN 4.7 3.5    Recent Labs Lab 05/07/13 0155  LIPASE 28   CBC:  Recent Labs Lab 05/07/13 0155 05/08/13 0445  WBC 7.8 4.1  NEUTROABS 5.9  --   HGB 16.1 13.4  HCT 44.0 39.2  MCV 81.6 84.3  PLT 210 175   Urinalysis:  Recent Labs Lab 05/07/13 1010  COLORURINE YELLOW  LABSPEC >1.046*  PHURINE 6.5  GLUCOSEU  NEGATIVE  HGBUR TRACE*  BILIRUBINUR NEGATIVE  KETONESUR NEGATIVE  PROTEINUR NEGATIVE  UROBILINOGEN 0.2  NITRITE NEGATIVE  LEUKOCYTESUR TRACE*    Micro Results: Recent Results (from the past 240 hour(s))  CLOSTRIDIUM DIFFICILE BY PCR     Status: None   Collection Time    05/07/13 10:11 AM      Result Value Range Status   C difficile by pcr NEGATIVE  NEGATIVE Final   Studies/Results: Dg Abd Portable 1v  05/09/2013   *RADIOLOGY REPORT*  Clinical Data: Bowel obstruction.  PORTABLE ABDOMEN - 1 VIEW  Comparison: Most recent 05/08/2013  Findings: Continued distention of small bowel loops with a paucity of colonic gas.  The caliber of small bowel loops may be slightly increased from yesterday's exam.  No upright film is obtained.  IMPRESSION: Suspected slight worsening of small bowel obstruction.   Original Report Authenticated By: [name], M.D.   Dg Abd Portable 1v  05/08/2013   *RADIOLOGY REPORT*  Clinical Data: Small bowel obstruction  PORTABLE ABDOMEN - 1 VIEW  Comparison:   the previous day's study  Findings: Several dilated small bowel loops in the mid abdomen,  stable in number and degree of dilatation since previous exam.  The colon is decompressed.  Bilateral pelvic phleboliths.  Regional bones unremarkable.  IMPRESSION:  1.  Stable appearance of mid small bowel obstruction.   Original Report Authenticated By: [name], MD   Medications: I have reviewed the patient's current medications. Scheduled Meds: . enoxaparin (LOVENOX) injection  40 mg Subcutaneous Q24H  . pantoprazole (PROTONIX) IV  40 mg Intravenous QHS  . white petrolatum       Continuous Infusions: . dextrose 5 % with KCl 20 mEq / L     PRN Meds:.lidocaine, morphine injection, ondansetron (ZOFRAN) IV, ondansetron, phenol  Assessment/Plan: 47yo M with PMH IBS, GERD, and multiple SBOs requiring lysis of adhesions, presents to the ED with acute onset abdominal pain, nausea and vomiting similar to his previous  episodes of small bowel obstruction.   1. Small bowel obstruction:  -H/o laparotomy with bowel resection in '90 in [place]. Since that time, he has had multiple episodes of SBO 2/2 adhesions requiring multiple surgeries (at least 3) for lysis of adhesions. CT abdomen and pelvis, a partial SBO is noted with transition point in the right mid to lower quadrant. -He is passing gas, denies nausea or vomiting or abdominal pain except with palpation.   -Surgery following with medical management  NPO, D51/2@125  changed to D5WK 20 meQ due to hypokalemia (3.4) -will need NGT placed if he is vomiting. Surgery states something further may need to be done at some point.  - Ambulate as much as tolerated - Zofran 4mg  IV q6h PRN,  Morphine 1-4mg  q3h PRN pain (though the patient has not required pain medications)  2. Hypokalemia:  -Potassium 3.4 this morning -changed to [id number] 125 cc/hr - AM BMP  3. IBS/GERD:  -IBS is primarily diarrheal. Checked C. diff which is negative.  -For his GERD, he is on Nexium outpatient changed to Protonix 40mg  IV  4. DVT PPx:  -Lovenox  5. F/E/N -D5 with 20 K 125 cc/hr  -will monitor and replace electrolytes prn  -NPO  Dispo: Disposition is deferred at this time, awaiting improvement of current medical problems.  Anticipated discharge in approximately 1-3 day(s).   The patient does have a current PCP (GUEST, Loretha Stapler, MD), therefore will not be requiring OPC follow-up after discharge.   The patient does not have transportation limitations that hinder transportation to clinic appointments.  .Services Needed at time of discharge: Y = Yes, Blank = No PT:   OT:   RN:   Equipment:   Other:     LOS: 2 days   Annett Gula [phone] 05/09/2013, 11:15 AM

## 2013-05-09 NOTE — Progress Notes (Signed)
Clinically better but may need something done at some point.

## 2013-05-10 ENCOUNTER — Encounter: Payer: Self-pay | Admitting: Internal Medicine

## 2013-05-10 LAB — BASIC METABOLIC PANEL
CO2: 26 mEq/L (ref 19–32)
Calcium: 9.4 mg/dL (ref 8.4–10.5)
Creatinine, Ser: 0.82 mg/dL (ref 0.50–1.35)
Glucose, Bld: 126 mg/dL — ABNORMAL HIGH (ref 70–99)

## 2013-05-10 MED ORDER — MAGNESIUM OXIDE 400 (241.3 MG) MG PO TABS
400.0000 mg | ORAL_TABLET | Freq: Once | ORAL | Status: AC
  Administered 2013-05-10: 400 mg via ORAL
  Filled 2013-05-10 (×2): qty 1

## 2013-05-10 MED ORDER — POTASSIUM CHLORIDE 10 MEQ/100ML IV SOLN: 10.0000 meq | INTRAVENOUS | Status: DC

## 2013-05-10 MED ORDER — SODIUM CHLORIDE 0.9 % IV SOLN
INTRAVENOUS | Status: DC
  Administered 2013-05-10: 17:00:00 via INTRAVENOUS

## 2013-05-10 MED ORDER — MAGNESIUM SULFATE 40 MG/ML IJ SOLN
2.0000 g | Freq: Once | INTRAMUSCULAR | Status: DC
  Filled 2013-05-10: qty 50

## 2013-05-10 MED ORDER — POTASSIUM CHLORIDE 20 MEQ/15ML (10%) PO LIQD
40.0000 meq | Freq: Once | ORAL | Status: AC
  Administered 2013-05-10: 40 meq via ORAL
  Filled 2013-05-10 (×2): qty 30

## 2013-05-10 MED ORDER — PANTOPRAZOLE SODIUM 40 MG PO TBEC
40.0000 mg | DELAYED_RELEASE_TABLET | Freq: Every day | ORAL | Status: DC
  Administered 2013-05-10: 40 mg via ORAL
  Filled 2013-05-10: qty 1

## 2013-05-10 NOTE — Progress Notes (Signed)
Advance diet.  Clinically better.

## 2013-05-10 NOTE — Progress Notes (Signed)
UR COMPLETED  

## 2013-05-10 NOTE — Discharge Summary (Signed)
Internal Medicine Teaching [facility] Discharge Note  Name: [name] MRN: [id number] DOB: [birth year] 47 y.o.  Date of Admission: 05/07/2013  1:34 AM Date of Discharge: 05/10/2013 Attending Physician: Farley Ly, MD PCP: Dr. Ruthell Rummage [place] on [address].  phone number [phone]; fax # [phone]  Discharge Diagnosis: 1. Partial small bowel obstruction 2. Hypokalemia 3. Slightly hypotensive with concern for orthostasis due to decreased oral intake 4. Irritable bowel syndrome (IBS) 5. GERD  Discharge Medications:   Medication List    TAKE these medications       ibuprofen 200 MG tablet  Commonly known as:  ADVIL,MOTRIN  Take 400 mg by mouth every 6 (six) hours as needed for pain.     NEXIUM 40 MG capsule  Generic drug:  esomeprazole  Take 40 mg by mouth daily before breakfast. As needed        Disposition and follow-up:   Mr.[name] was discharged from [facility] in good condition.  At the hospital follow up visit please address  1) Repeat BMET, Magnesium  2) check orthostatics 3) Ask about small bowel obstruction symptoms   Follow-up Appointments: Follow-up Information   Call CCS,MD, MD. (As needed)    Contact information:   [address] [place] [address] [place] [address] [phone]      Discharge Orders   Future Appointments Provider Department Dept Phone   05/16/2013 1:45 PM Bronson Curb, MD [facility] [phone]   Future Orders Complete By Expires     Discharge instructions  As directed     Comments:      Please follow up with Internal Medicine Clinic [phone] (groud floor of [facility] hospital) Dr. Manson Passey 05/16/13 at 1:45 PM.  Please follow up with your primary care doctor in 2 weeks. [phone].  Dr. Edmon Crape is no longer a physician there.  You will be seeing Dr. Tresa Endo On 05/24/13 at 11:40 AM.    Call [facility] if you develop symptoms of abdominal pain, nausea or vomiting  in the future.  Go to the Emergency Dept. If you need immediate attention   Eat liquid diet for now and advance your diet slowly       Consultations: Treatment Team:  [facility], MD-Dr. Luisa Hart  Procedures Performed:  Ct Abdomen Pelvis W Contrast  05/07/2013   *RADIOLOGY REPORT*  Clinical Data: Abdominal pain with nausea and diarrhea.  History of bowel obstructions to at times in the past.  History of previous abdominal surgery.  CT ABDOMEN AND PELVIS WITH CONTRAST  Technique:  Multidetector CT imaging of the abdomen and pelvis was performed following the standard protocol during bolus administration of intravenous contrast.  Contrast: OMNIPAQUE IOHEXOL 300 MG/ML  SOLN  Comparison: Current abdomen radiographs showing evidence of a small bowel obstruction.  Prior CT, 04/05/2010.  Findings: There is a partial small bowel obstruction.  The exact transition point is not defined with certainty but is suggested in the right lower quadrant.  This is in the region of the junction of the jejunum and ileum.  The more distal small bowel is decompressed.  The colon is normal in caliber.  There is no bowel wall thickening.  There is no mesenteric inflammation.  Subsegmental atelectasis is noted at the lung bases.  The heart is normal in size.  The liver, spleen and pancreas are unremarkable. The gallbladder surgically absent.  No bile duct dilation.  No adrenal masses.  Tiny low  density renal masses are noted, too small to fully characterize but likely cysts.  Kidneys are otherwise unremarkable.  Normal ureters and bladder.  No adenopathy.  No abnormal fluid collections.  No free air.  No significant bony abnormality.  IMPRESSION: Partial small bowel obstruction, mid grade, with the transition point is suggested in the right mid to lower quadrant.  No bowel wall thickening to suggest edema/ischemia.  No mesenteric inflammation.  No free air.  No other acute findings.   Original Report Authenticated By: [name],  M.D.   Dg Abd Portable 1v  05/09/2013   *RADIOLOGY REPORT*  Clinical Data: Bowel obstruction.  PORTABLE ABDOMEN - 1 VIEW  Comparison: Most recent 05/08/2013  Findings: Continued distention of small bowel loops with a paucity of colonic gas.  The caliber of small bowel loops may be slightly increased from yesterday's exam.  No upright film is obtained.  IMPRESSION: Suspected slight worsening of small bowel obstruction.   Original Report Authenticated By: [name], M.D.   Dg Abd Portable 1v  05/08/2013   *RADIOLOGY REPORT*  Clinical Data: Small bowel obstruction  PORTABLE ABDOMEN - 1 VIEW  Comparison:   the previous day's study  Findings: Several dilated small bowel loops in the mid abdomen, stable in number and degree of dilatation since previous exam.  The colon is decompressed.  Bilateral pelvic phleboliths.  Regional bones unremarkable.  IMPRESSION:  1.  Stable appearance of mid small bowel obstruction.   Original Report Authenticated By: [name], MD   Dg Abd Portable 1v  05/07/2013   *RADIOLOGY REPORT*  Clinical Data: Severe abdominal pain, nausea  PORTABLE ABDOMEN - 1 VIEW  Comparison: 08/19/2011  Findings: There are dilated loops of small bowel.  Some air noted within colon however relative paucity of distal colonic gas. Multiple calcific densities projecting over the upper quadrants are nonspecific and may be a renal calculi. Midline calcific densities raise the potential for intrapancreatic.  Surgical clips right upper quadrant.  No acute osseous finding.  IMPRESSION: Dilated bowel loops with relative paucity of distal bowel gas raise concern for obstruction.   Original Report Authenticated By: [name], M.D.    2D Echo: none  Cardiac Cath: none  Admission HPI:  Chief Complaint:  Small bowel obstruction  History of Present Illness:  47yo M with PMH IBS, GERD, and multiple SBOs requiring lysis of adhesions, presents to the ED with acute onset abdominal pain, similar to his  previous episodes of small bowel obstruction.  H/o laparotomy with bowel resection in '90 in [place]. Since that time, he has had multiple episodes of SBO 2/2 adhesions requiring multiple surgeries for lysis of adhesions. He presents today with nausea/vomiting and pain since Thursday, when he states he stopped eating and was drinking only a small amount of fluids. He states hat he is having diarrhea that is predominately water and is passing small amounts of flatus.   Review of Systems:  A 10 point ROS was performed; pertinent positives and negatives were noted in the HPI   Physical Exam:  Blood pressure 106/71, pulse 79, temperature 98.3 F (36.8 C), temperature source Oral, resp. rate 16, SpO2 97.00%.  General: Alert, well-developed, and cooperative on examination.  Head: Normocephalic and atraumatic.  Eyes: Pupils equal, round, and reactive to light, EOMI, no injection.  Mouth: Pharynx pink  Neck: Supple, full ROM Lungs: CTAB, normal respiratory effort, no accessory muscle use, no crackles, and no wheezes. Heart: Regular rate, regular rhythm, no murmur, no  gallop, and no rub.  Abdomen: Soft, mild diffuse tenderness to palpation (after pain medication), non-distended, hypoactive bowel sounds, no guarding, no rebound tenderness, no organomegaly.  Msk: No joint swelling, warmth, or erythema.  Extremities: 2+ radial and DP pulses bilaterally. No cyanosis, clubbing, edema Neurologic: Alert & oriented X3, cranial nerves II-XII intact, strength normal in all extremities  Skin: Turgor normal and no rashes.  Psych: Normal mood and affect. Memory intact for recent and remote, normally interactive, good eye contact, not anxious appearing, and not depressed appearing.    Hospital Course by problem list: 1. Partial small bowel obstruction 2. Hypokalemia 3. Slightly hypotensive with concern for orthostasis due to decreased oral intake 4. Irritable bowel syndrome (IBS) 5. GERD   47 year old male  with past medical history of irritable bowel syndrome, GERD, and multiple SBOs requiring lysis of adhesions and surgeries at least 3 times.  He presented to the ED with acute onset abdominal pain, nausea and vomiting similar to his previous episodes of small bowel obstruction.   1. Small bowel obstruction (SBO) History of laparotomy with bowel resection in '90 in [place]. Since that time, he has had multiple episodes of SBO secondary to adhesions requiring multiple surgeries (at least 3) for lysis of adhesions.  CT noted partial small bowel obstruction, mid grade, with the transition point is suggested in the right mid to lower quadrant.  Surgery followed this admission with medical management such as intravenous fluids, nothing by mouth then transitioned to liquid dysphagia 3 diet (patient tolerated) on day of discharge, ambulation as much as tolerated, Incentive spirometry, as needed Zofran 4mg  IV q6h, Morphine 1-4mg  q3h PRN pain (though the patient has not required pain medications).  He did not require a nasogastric tube this admission.  He had serial abdominal xrays this admission.  He was passing gas and had a small to moderate size bowel movement on 05/09/13.  Surgery did indicate that they do not need outpatient follow up after discharge.  The patient will follow up where he previously established a primary care doctor in [place], [place]. Advised to intake liquid diet for now and advance slowly.  Advised if symptoms such as abdominal pain, nausea/vomiting return seek medical attention immediately.    2. Hypokalemia:  Potassium was 3.4 this admission changed to D51/2 Normal Saline to  D5W with K 125 cc/hr.  On day of discharge we gave potassium solution 40 meQ x 1 solution. Magnesium was 1.8 so we gave Mag ox 400 mg x 1 since the patient lost intravenous access.     3. Slightly hypotensive with concern for orthostasis due to decreased oral intake Checked orthostatics lying 99/61 (HR 54), sitting  107/75 (HR 85), standing 103/73 (HR 72). May be hypotensive due to decreased oral intake over few days due to small bowel obstruction.   4. Irritable bowel syndrome (IBS) IBS is primarily diarrheal. Checked C. difficile which was negative.   5. GERD  For his GERD, he is on Nexium outpatient changed to Protonix 40mg  IV this admission will resume home medications at discharge.    DVT Prophylaxis with Lovenox    Discharge Vitals:  BP 115/79  Pulse 89  Temp(Src) 97.8 F (36.6 C) (Oral)  Resp 17  Ht 6' (1.829 m)  Wt 190 lb 11.2 oz (86.5 kg)  BMI 25.86 kg/m2  SpO2 96%  Discharge physical exam: General: resting in bed, NAD, alert and oriented x 3  HEENT: [place]/at, no scleral icterus  Cardiac: RRR,  no rubs, murmurs or gallops  Pulm: clear to auscultation bilaterally, no wheezes, rales, or rhonchi  Abd: soft, mild ttp RUQ, nondistended, hypoactive BS present  Ext: warm and well perfused, no pedal edema  Neuro: alert and oriented X3, grossly neurologically intact, moving all 4 extremities   Discharge Labs:  Results for orders placed during the hospital encounter of 05/07/13 (from the past 24 hour(s))  BASIC METABOLIC PANEL     Status: Abnormal   Collection Time    05/10/13  8:02 AM      Result Value Range   Sodium 137  135 - 145 mEq/L   Potassium 3.4 (*) 3.5 - 5.1 mEq/L   Chloride 103  96 - 112 mEq/L   CO2 26  19 - 32 mEq/L   Glucose, Bld 126 (*) 70 - 99 mg/dL   BUN 5 (*) 6 - 23 mg/dL   Creatinine, Ser 5.78  0.50 - 1.35 mg/dL   Calcium 9.4  8.4 - 46.9 mg/dL   GFR calc non Af Amer >90  >90 mL/min   GFR calc Af Amer >90  >90 mL/min  MAGNESIUM     Status: None   Collection Time    05/10/13  8:02 AM      Result Value Range   Magnesium 1.8  1.5 - 2.5 mg/dL    Signed: Annett Gula 05/10/2013, 6:32 PM   Time Spent on Discharge: >31minutes  Services Ordered on Discharge: none Equipment Ordered on Discharge: none

## 2013-05-10 NOTE — Progress Notes (Signed)
  Subjective: Pt feeling great.  No pain,N/V.  Wants to eat.  Ambulating well.  Wants to go home.  Not interested in surgery.    Objective: Vital signs in last 24 hours: Temp:  [97.5 F (36.4 C)-98.4 F (36.9 C)] 97.5 F (36.4 C) (06/03 0627) Pulse Rate:  [49-56] 49 (06/03 0627) Resp:  [16-17] 16 (06/03 0627) BP: (97-104)/(56-67) 102/56 mmHg (06/03 0627) SpO2:  [97 %-98 %] 98 % (06/03 0627) Weight:  [190 lb 11.2 oz (86.5 kg)] 190 lb 11.2 oz (86.5 kg) (06/02 0900) Last BM Date: 05/09/13  Intake/Output from previous day: 06/02 0701 - 06/03 0700 In: 1923 [I.V.:1923] Out: -  Intake/Output this shift:    PE: Gen:  Alert, NAD, pleasant Abd: Soft, NT/ND, +BS, no HSM, old large midline scar with hypopigmented skin  Lab Results:   Recent Labs  05/08/13 0445  WBC 4.1  HGB 13.4  HCT 39.2  PLT 175   BMET  Recent Labs  05/08/13 0445 05/09/13 0640  NA 137 139  K 3.4* 3.4*  CL 102 102  CO2 27 27  GLUCOSE 129* 120*  BUN 7 6  CREATININE 0.85 0.89  CALCIUM 9.1 9.0   PT/INR No results found for this basename: LABPROT, INR,  in the last 72 hours CMP     Component Value Date/Time   NA 139 05/09/2013 0640   K 3.4* 05/09/2013 0640   CL 102 05/09/2013 0640   CO2 27 05/09/2013 0640   GLUCOSE 120* 05/09/2013 0640   BUN 6 05/09/2013 0640   CREATININE 0.89 05/09/2013 0640   CALCIUM 9.0 05/09/2013 0640   PROT 6.5 05/08/2013 0445   ALBUMIN 3.5 05/08/2013 0445   AST 19 05/08/2013 0445   ALT 26 05/08/2013 0445   ALKPHOS 59 05/08/2013 0445   BILITOT 1.0 05/08/2013 0445   GFRNONAA >90 05/09/2013 0640   GFRAA >90 05/09/2013 0640   Lipase     Component Value Date/Time   LIPASE 28 05/07/2013 0155       Studies/Results: Dg Abd Portable 1v  05/09/2013   *RADIOLOGY REPORT*  Clinical Data: Bowel obstruction.  PORTABLE ABDOMEN - 1 VIEW  Comparison: Most recent 05/08/2013  Findings: Continued distention of small bowel loops with a paucity of colonic gas.  The caliber of small bowel loops may be slightly  increased from yesterday's exam.  No upright film is obtained.  IMPRESSION: Suspected slight worsening of small bowel obstruction.   Original Report Authenticated By: [name], M.D.    Anti-infectives: Anti-infectives   None       Assessment/Plan Recurrent PSBO - clinically improving 1. Will advance to clears today, D/C IVF when taking good PO, hasn't taken any pain meds 2. Ambulation and IS  3. SCD's and lovenox  4. Hopefully home tomorrow  Hypokalemia - on D5W with K+     LOS: 3 days    DORT, Sharyah Bostwick 05/10/2013, 8:06 AM Pager: [phone]

## 2013-05-10 NOTE — Progress Notes (Addendum)
Subjective: Patient had small to mod. Bowel movement last night and he is still passing gas.  He will try clears today.  He is asking about discharge.  He denies abdominal pain.  He has felt nauseated this am which he thinks is due to not eating.  He states he has not vomiting.  His states his blood pressure has been low which is new for him and he has been a little lightheaded.    Objective: Vital signs in last 24 hours: Filed Vitals:   05/09/13 0900 05/09/13 1336 05/09/13 2248 05/10/13 0627  BP:  104/67 97/56 102/56  Pulse:  50 56 49  Temp:  97.6 F (36.4 C) 98.4 F (36.9 C) 97.5 F (36.4 C)  TempSrc:  Oral Oral Oral  Resp:  17 16 16   Height: 6' (1.829 m)     Weight: 190 lb 11.2 oz (86.5 kg)     SpO2:  97% 98% 98%   Weight change:   Intake/Output Summary (Last 24 hours) at 05/10/13 0924 Last data filed at 05/09/13 1459  Gross per 24 hour  Intake   1923 ml  Output      0 ml  Net   1923 ml   Vitals reviewed. General: resting in bed, NAD, alert and oriented x 3  HEENT: [place]/at, no scleral icterus Cardiac: RRR, no rubs, murmurs or gallops Pulm: clear to auscultation bilaterally, no wheezes, rales, or rhonchi Abd: soft, mild ttp RUQ, nondistended, hypoactive BS present Ext: warm and well perfused, no pedal edema Neuro: alert and oriented X3, grossly neurologically intact, moving all 4 extremities    Lab Results: Basic Metabolic Panel:  Recent Labs Lab 05/09/13 0640 05/10/13 0802  NA 139 137  K 3.4* 3.4*  CL 102 103  CO2 27 26  GLUCOSE 120* 126*  BUN 6 5*  CREATININE 0.89 0.82  CALCIUM 9.0 9.4  MG  --  1.8   Liver Function Tests:  Recent Labs Lab 05/07/13 0155 05/08/13 0445  AST 25 19  ALT 27 26  ALKPHOS 81 59  BILITOT 1.0 1.0  PROT 8.1 6.5  ALBUMIN 4.7 3.5    Recent Labs Lab 05/07/13 0155  LIPASE 28   CBC:  Recent Labs Lab 05/07/13 0155 05/08/13 0445  WBC 7.8 4.1  NEUTROABS 5.9  --   HGB 16.1 13.4  HCT 44.0 39.2  MCV 81.6 84.3  PLT 210  175   Urinalysis:  Recent Labs Lab 05/07/13 1010  COLORURINE YELLOW  LABSPEC >1.046*  PHURINE 6.5  GLUCOSEU NEGATIVE  HGBUR TRACE*  BILIRUBINUR NEGATIVE  KETONESUR NEGATIVE  PROTEINUR NEGATIVE  UROBILINOGEN 0.2  NITRITE NEGATIVE  LEUKOCYTESUR TRACE*    Micro Results: Recent Results (from the past 240 hour(s))  CLOSTRIDIUM DIFFICILE BY PCR     Status: None   Collection Time    05/07/13 10:11 AM      Result Value Range Status   C difficile by pcr NEGATIVE  NEGATIVE Final   Studies/Results: Dg Abd Portable 1v  05/09/2013   *RADIOLOGY REPORT*  Clinical Data: Bowel obstruction.  PORTABLE ABDOMEN - 1 VIEW  Comparison: Most recent 05/08/2013  Findings: Continued distention of small bowel loops with a paucity of colonic gas.  The caliber of small bowel loops may be slightly increased from yesterday's exam.  No upright film is obtained.  IMPRESSION: Suspected slight worsening of small bowel obstruction.   Original Report Authenticated By: [name], M.D.   Medications: I have reviewed the patient's current medications. Scheduled  Meds: . antiseptic oral rinse  15 mL Mouth Rinse q12n4p  . chlorhexidine  15 mL Mouth Rinse BID  . enoxaparin (LOVENOX) injection  40 mg Subcutaneous Q24H  . magnesium sulfate 1 - 4 g bolus IVPB  2 g Intravenous Once  . pantoprazole (PROTONIX) IV  40 mg Intravenous QHS  . potassium chloride  10 mEq Intravenous Q1 Hr x 3   Continuous Infusions: . dextrose 5 % with KCl 20 mEq / L 20 mEq (05/10/13 0427)   PRN Meds:.lidocaine, morphine injection, ondansetron (ZOFRAN) IV, ondansetron, phenol  Assessment/Plan: 47yo M with PMH IBS, GERD, and multiple SBOs requiring lysis of adhesions, presents to the ED with acute onset abdominal pain, nausea and vomiting similar to his previous episodes of small bowel obstruction.   1. Small bowel obstruction: n.   -Surgery following with medical management advanced diet today to clears and dys 3, D5WK 20 meQ  Will d/c  later today if patient tolerating po or possibly tomorrow if he is not tolerating po.  Diet is supposed to be clears for breakfast and dys 3 for lunch - Ambulate as much as tolerated, Incentive spirometry - Zofran 4mg  IV q6h PRN,  Morphine 1-4mg  q3h PRN pain (though the patient has not required pain medications)  2. Hypokalemia:  -changed to [id number] 125 cc/hr.  Will give K 40 x 1 solution. Mag 1.8 will give Mag ox 400 mg x 1   3. Slightly hypotensive -will check orthostatics.  May be hypotensive due to decreased oral intake over few days with SBP -monitor VS  4. IBS/GERD:  -IBS is primarily diarrheal. Checked C. diff which is negative.  -For his GERD, he is on Nexium outpatient changed to Protonix 40mg  IV  5. DVT PPx:  -Lovenox  6. F/E/N -D5 with 20 K 125 cc/hr  -will monitor and replace electrolytes prn. K 3.4, Mag 1.8.  Will give K 40 x 1 solution. Mag 1.8 will give Mag ox 400 mg x 1  -surgery advanced diet to dyshagia 3. Will see how patient is tolerating  Dispo: Disposition is deferred at this time, awaiting improvement of current medical problems.  Anticipated discharge in approximately 0-1 day(s).   The patient does have a current PCP (GUEST, Loretha Stapler, MD), therefore will not be requiring OPC follow-up after discharge.   The patient does not have transportation limitations that hinder transportation to clinic appointments.  .Services Needed at time of discharge: Y = Yes, Blank = No PT:   OT:   RN:   Equipment:   Other:     LOS: 3 days   Annett Gula [phone] 05/10/2013, 9:24 AM

## 2013-05-10 NOTE — Progress Notes (Signed)
Dr. Shirlee Latch called and reported pt can be discharged. Pt was given discharge instructions and follow up appointments. Pt educated on diagnosis and care. Pt waiting on ride home but ready to go home. Pt verbalized understanding and asked appropriate questions.

## 2013-05-10 NOTE — Progress Notes (Signed)
Nutrition Brief Note  Patient identified on the Malnutrition Screening Tool (MST) Report  Body mass index is 25.86 kg/(m^2). Patient meets criteria for overweight based on current BMI.   Current diet order is Regular, patient to receive first tray for dinner. Labs and medications reviewed.   Pt unsure about wt loss PTA.  Per chart review pt wt is stable between 190-195 lbs for the past 3 years. Pt admitted with acute onset abdominal pain with N/V. Pt with h/o recurrent SBO. No nutrition interventions warranted at this time. Diet has been advance to Regular, pt to attempt tray this evening, currently hungry. If nutrition issues arise, please consult RD. Will follow for nutritional adequacy and tolerance.   Loyce Dys, MS RD LDN Clinical Inpatient Dietitian Pager: [phone] Weekend/After hours pager: [phone]

## 2013-05-10 NOTE — Progress Notes (Signed)
Spoke with the patient.  He wants to discharge today but will reassess in around 6-7 PM to see how he feels.  He is agreeable to follow up with [facility] next week and his primary care office in 2 weeks.  He is still tolerating liquids without abdominal pain, nausea or vomiting.  His wife is due to have a baby in 1 week.  He denies feeling lightheaded or having chest pain.  He is agreeable to IVF.    [place] [phone]

## 2013-05-10 NOTE — Progress Notes (Signed)
Pt's IV site was leaking. Pt reported that he did not want another IV site and planned to leave. Pt reported he thinks he can do without the bag of IV fluids. Dr. Shirlee Latch was notified.

## 2013-05-10 NOTE — Progress Notes (Signed)
Internal Medicine Attending  Date: 05/10/2013  Patient name: [name] Medical record number: [id number] Date of birth: 23-Jan-1966 Age: 48 y.o. Gender: male  I saw and evaluated the patient. I reviewed the resident's note by Dr. Shirlee Latch and I agree with the resident's findings and plans as documented in her note.

## 2013-05-16 ENCOUNTER — Ambulatory Visit: Payer: BC Managed Care – PPO | Admitting: Internal Medicine

## 2014-01-23 ENCOUNTER — Ambulatory Visit (INDEPENDENT_AMBULATORY_CARE_PROVIDER_SITE_OTHER): Payer: BC Managed Care – PPO | Admitting: Family Medicine

## 2014-01-23 ENCOUNTER — Ambulatory Visit: Payer: BC Managed Care – PPO

## 2014-01-23 VITALS — BP 118/82 | HR 94 | Temp 98.3°F | Resp 18 | Ht 73.0 in | Wt 198.6 lb

## 2014-01-23 DIAGNOSIS — R197 Diarrhea, unspecified: Secondary | ICD-10-CM

## 2014-01-23 DIAGNOSIS — R5383 Other fatigue: Secondary | ICD-10-CM

## 2014-01-23 DIAGNOSIS — R1013 Epigastric pain: Secondary | ICD-10-CM

## 2014-01-23 DIAGNOSIS — R5381 Other malaise: Secondary | ICD-10-CM

## 2014-01-23 DIAGNOSIS — J029 Acute pharyngitis, unspecified: Secondary | ICD-10-CM

## 2014-01-23 LAB — POCT CBC
Granulocyte percent: 31 %G — AB (ref 37–80)
HEMATOCRIT: 47.9 % (ref 43.5–53.7)
HEMOGLOBIN: 15.2 g/dL (ref 14.1–18.1)
LYMPH, POC: 1.8 (ref 0.6–3.4)
MCH: 28.7 pg (ref 27–31.2)
MCHC: 31.7 g/dL — AB (ref 31.8–35.4)
MCV: 90.3 fL (ref 80–97)
MID (cbc): 0.4 (ref 0–0.9)
MPV: 9.6 fL (ref 0–99.8)
POC Granulocyte: 1 — AB (ref 2–6.9)
POC LYMPH PERCENT: 55.6 %L — AB (ref 10–50)
POC MID %: 13.4 % — AB (ref 0–12)
Platelet Count, POC: 218 10*3/uL (ref 142–424)
RBC: 5.3 M/uL (ref 4.69–6.13)
RDW, POC: 13.4 %
WBC: 3.3 10*3/uL — AB (ref 4.6–10.2)

## 2014-01-23 LAB — POCT RAPID STREP A (OFFICE): RAPID STREP A SCREEN: NEGATIVE

## 2014-01-23 LAB — IFOBT (OCCULT BLOOD): IMMUNOLOGICAL FECAL OCCULT BLOOD TEST: NEGATIVE

## 2014-01-23 MED ORDER — TRAMADOL HCL 50 MG PO TABS: 50.0000 mg | ORAL_TABLET | Freq: Three times a day (TID) | ORAL | Status: DC | PRN

## 2014-01-23 NOTE — Patient Instructions (Signed)
Stick with clear liquids for the next couple of days- if you are getting better then you can try advancing your diet to easy to digest and soft foods.  On the other hand, if you are getting worse then you need to go to the ER.  I will get in touch with the rest of your labs when they come in.

## 2014-01-23 NOTE — Progress Notes (Signed)
[facility] [address], [place] [id number] [phone]  Date:  01/23/2014   Name:  [name]   DOB:  07/31/1966   MRN:  [id number]  PCP:  Tally DueGUEST, CHRIS WARREN, MD    Chief Complaint: Sore Throat, Diarrhea and Nausea   History of Present Illness:  [name] is a 48 y.o. very pleasant male patient who presents with the following:  He is here today for a physical, but he is also ill.   We have seen him in the past, but it has been several years so he is a "new patient" today.   He has a history of multiple SBO and a partial colectomy in [place] in 1990.  Since then he has had a laparotomy 3x (or more) and has been hospitalized a few times for SBO He notes abdominal pain- mild, for 2 days, and diarrhea for the last 2 days.   He has not eaten anything since Friday- "just liquid."  He is drinking some fluids, soup- his appetitie is not very good.   He has not had any vomiting- just nausea.   He does not think he has had any blood in his stool.    He has had multiple SBO- Dr. Leone PayorGessner is his GI but he has not seen him in some time  He does not feel like he has a SOB- generally then he will have more pain and vomiting.    He also noted a ST, and feels congested, body aches.  He is taking ibuprofen and nexium  Patient Active Problem List   Diagnosis Date Noted  . Partial small bowel obstruction 05/07/2013  . ALLERGIC RHINITIS 02/07/2011  . DYSPNEA 02/07/2011  . CHEST PAIN, PRECORDIAL 02/05/2011  . POSTURAL LIGHTHEADEDNESS 01/16/2011  . OBSTRUCTIVE SLEEP APNEA 10/23/2010  . Cough 08/20/2010  . GERD 03/12/2010  . Irritable bowel syndrome 03/12/2010  . DIARRHEA, RECURRENT 02/05/2010    Past Medical History  Diagnosis Date  . Postural lightheadedness   . Chest wall pain   . Obstructive sleep apnea     apnealink 10/23/10 AHI 5, Sp02 low 87%  . Headache(784.0)   . Irritable bowel syndrome   . GERD (gastroesophageal reflux disease)   . Small  bowel obstruction   . Diarrhea recurrent  . Cough     Past Surgical History  Procedure Laterality Date  . Colectomy  1994    segmental  . Laparotomy  2002, 2005, 2009  . Gastrostomy  2005    stamm  . Cholecystectomy  2009  . Sphincterotomy  2009    lateral internal  . Colonoscopy  07/17/2011    hemorrhoids, otherwise normal including terminal ileum and random colon biopises  . Upper gastrointestinal endoscopy  07/17/2011    normal, including duodenal biopsies    History  Substance Use Topics  . Smoking status: Never Smoker   . Smokeless tobacco: Never Used  . Alcohol Use: No    Family History  Problem Relation Age of Onset  . Cirrhosis Father     No Known Allergies  Medication list has been reviewed and updated.  Current Outpatient Prescriptions on File Prior to Visit  Medication Sig Dispense Refill  . esomeprazole (NEXIUM) 40 MG capsule Take 40 mg by mouth daily before breakfast. As needed       . ibuprofen (ADVIL,MOTRIN) 200 MG tablet Take 400 mg by mouth every 6 (six) hours as needed for pain.  No current facility-administered medications on file prior to visit.    Review of Systems:  As per HPI- otherwise negative.   Physical Examination: Filed Vitals:   01/23/14 1606  BP: 118/82  Pulse: 94  Temp: 98.3 F (36.8 C)  Resp: 18   Filed Vitals:   01/23/14 1606  Height: 6\' 1"  (1.854 m)  Weight: 198 lb 9.6 oz (90.084 kg)   Body mass index is 26.21 kg/(m^2). Ideal Body Weight: Weight in (lb) to have BMI = 25: 189.1  GEN: WDWN, NAD, Non-toxic, A & O x 3, looks well HEENT: Atraumatic, Normocephalic. Neck supple. No masses, No LAD.  Bilateral TM wnl, oropharynx normal.  PEERL,EOMI.  Ears and Nose: No external deformity. CV: RRR, No M/G/R. No JVD. No thrill. No extra heart sounds. PULM: CTA B, no wheezes, crackles, rhonchi. No retractions. No resp. distress. No accessory muscle use. ABD: S, NT, ND, +BS. No rebound. No HSM. BS are active.  Midline  scarring from previous operations EXTR: No c/c/e NEURO Normal gait.  PSYCH: Normally interactive. Conversant. Not depressed or anxious appearing.  Calm demeanor.   [facility] reading (PRIMARY) by  Dr. Patsy Lager. abd series: non- specific bowel gas pattern.    ACUTE ABDOMEN SERIES (ABDOMEN 2 VIEW & CHEST 1 VIEW)  COMPARISON: DG ABD PORTABLE 1V dated 05/09/2013  FINDINGS: The chest film reveals the lungs to be adequately inflated. There is no focal infiltrate. The cardiac silhouette and pulmonary vascularity appear normal. There is no pleural effusion.  Within the abdomen there is a loop of mildly distended gas-filled small bowel in the left mid abdomen. There are additional loops of partially distended small bowel to the right of midline. There is stool and gas in the rectum with smaller amounts elsewhere in the colon. No free extraluminal gas collections are demonstrated. The bony structures exhibit no acute abnormalities. There are surgical clips in the gallbladder fossa. There are phleboliths within the pelvis.  IMPRESSION: 1. No acute cardiopulmonary abnormality is demonstrated. 2. The bowel gas pattern suggests a small bowel ileus or partial obstruction. There is no evidence of perforation. When compared to the previous study the degree of small bowel distention is considerably less.   Results for orders placed in visit on 01/23/14  POCT CBC      Result Value Ref Range   WBC 3.3 (*) 4.6 - 10.2 K/uL   Lymph, poc 1.8  0.6 - 3.4   POC LYMPH PERCENT 55.6 (*) 10 - 50 %L   MID (cbc) 0.4  0 - 0.9   POC MID % 13.4 (*) 0 - 12 %M   POC Granulocyte 1.0 (*) 2 - 6.9   Granulocyte percent 31.0 (*) 37 - 80 %G   RBC 5.30  4.69 - 6.13 M/uL   Hemoglobin 15.2  14.1 - 18.1 g/dL   HCT, POC 16.1  09.6 - 53.7 %   MCV 90.3  80 - 97 fL   MCH, POC 28.7  27 - 31.2 pg   MCHC 31.7 (*) 31.8 - 35.4 g/dL   RDW, POC 04.5     Platelet Count, POC 218  142 - 424 K/uL   MPV 9.6  0 - 99.8 fL  IFOBT (OCCULT  BLOOD)      Result Value Ref Range   IFOBT Negative    POCT RAPID STREP A (OFFICE)      Result Value Ref Range   Rapid Strep A Screen Negative  Negative    Assessment and Plan: Diarrhea -  Plan: POCT CBC, Comprehensive metabolic panel, IFOBT POC (occult bld, rslt in office), Stool culture, DG Abd Acute W/Chest  Other malaise and fatigue  Acute pharyngitis - Plan: POCT CBC, POCT rapid strep A  Abdominal pain, epigastric - Plan: traMADol (ULTRAM) 50 MG tablet  He was able to provide a stool sample here in clinic  Mr. Prashad is here today requesting a CPE.  However we had to convert to a sick visit today as he is ill.  Clinically he does not have a SBO as he is still passing stool, not vomiting and is taking po liquids.  However he will need to remain on clears until he improves Otherwise his ST and nasal congestion are likely due to a viral illness.  discussed supportive care.  He did request a RF of his tramadol which he keeps on hand for pain as needed See patient instructions for more details.     Signed Abbe Amsterdam, MD

## 2014-01-24 LAB — COMPREHENSIVE METABOLIC PANEL
ALT: 26 U/L (ref 0–53)
AST: 21 U/L (ref 0–37)
Albumin: 4.6 g/dL (ref 3.5–5.2)
Alkaline Phosphatase: 68 U/L (ref 39–117)
BILIRUBIN TOTAL: 1.1 mg/dL (ref 0.2–1.2)
BUN: 12 mg/dL (ref 6–23)
CALCIUM: 9.4 mg/dL (ref 8.4–10.5)
CHLORIDE: 104 meq/L (ref 96–112)
CO2: 28 meq/L (ref 19–32)
CREATININE: 0.87 mg/dL (ref 0.50–1.35)
GLUCOSE: 88 mg/dL (ref 70–99)
Potassium: 4.3 mEq/L (ref 3.5–5.3)
Sodium: 140 mEq/L (ref 135–145)
Total Protein: 7.3 g/dL (ref 6.0–8.3)

## 2014-01-26 ENCOUNTER — Telehealth: Payer: Self-pay

## 2014-01-26 NOTE — Telephone Encounter (Signed)
Patient is needing a return to work note for Monday he was seen on the 16th by Dr. Golda Acreoland and has not gone to work since. Please advise. Thank you!  Call when ready  At [phone]

## 2014-01-27 LAB — STOOL CULTURE

## 2014-01-27 NOTE — Telephone Encounter (Signed)
[facility]  Stool culture negative.  As long as he is feeling well he may RTW, will leave note up front for him

## 2014-01-27 NOTE — Telephone Encounter (Signed)
Okay to give note 

## 2014-01-29 ENCOUNTER — Encounter: Payer: Self-pay | Admitting: Family Medicine

## 2014-01-29 ENCOUNTER — Other Ambulatory Visit: Payer: Self-pay | Admitting: Family Medicine

## 2014-01-29 DIAGNOSIS — D72819 Decreased white blood cell count, unspecified: Secondary | ICD-10-CM

## 2014-04-22 ENCOUNTER — Ambulatory Visit (INDEPENDENT_AMBULATORY_CARE_PROVIDER_SITE_OTHER): Payer: BC Managed Care – PPO | Admitting: Family Medicine

## 2014-04-22 VITALS — BP 120/68 | HR 88 | Temp 98.0°F | Resp 18 | Ht 72.5 in | Wt 199.0 lb

## 2014-04-22 DIAGNOSIS — R059 Cough, unspecified: Secondary | ICD-10-CM

## 2014-04-22 DIAGNOSIS — R05 Cough: Secondary | ICD-10-CM

## 2014-04-22 DIAGNOSIS — J209 Acute bronchitis, unspecified: Secondary | ICD-10-CM

## 2014-04-22 MED ORDER — BENZONATATE 200 MG PO CAPS: 200.0000 mg | ORAL_CAPSULE | Freq: Three times a day (TID) | ORAL | Status: DC | PRN

## 2014-04-22 MED ORDER — HYDROCOD POLST-CHLORPHEN POLST 10-8 MG/5ML PO LQCR: 5.0000 mL | Freq: Two times a day (BID) | ORAL | Status: DC | PRN

## 2014-04-22 MED ORDER — AZITHROMYCIN 250 MG PO TABS: ORAL_TABLET | ORAL | Status: DC

## 2014-04-22 MED ORDER — GUAIFENESIN ER 1200 MG PO TB12: 1.0000 | ORAL_TABLET | Freq: Two times a day (BID) | ORAL | Status: DC | PRN

## 2014-04-22 NOTE — Patient Instructions (Signed)
Acute Bronchitis Bronchitis is inflammation of the airways that extend from the windpipe into the lungs (bronchi). The inflammation often causes mucus to develop. This leads to a cough, which is the most common symptom of bronchitis.  In acute bronchitis, the condition usually develops suddenly and goes away over time, usually in a couple weeks. Smoking, allergies, and asthma can make bronchitis worse. Repeated episodes of bronchitis may cause further lung problems.  CAUSES Acute bronchitis is most often caused by the same virus that causes a cold. The virus can spread from person to person (contagious).  SIGNS AND SYMPTOMS   Cough.   Fever.   Coughing up mucus.   Body aches.   Chest congestion.   Chills.   Shortness of breath.   Sore throat.  DIAGNOSIS  Acute bronchitis is usually diagnosed through a physical exam. Tests, such as chest X-rays, are sometimes done to rule out other conditions.  TREATMENT  Acute bronchitis usually goes away in a couple weeks. Often times, no medical treatment is necessary. Medicines are sometimes given for relief of fever or cough. Antibiotics are usually not needed but may be prescribed in certain situations. In some cases, an inhaler may be recommended to help reduce shortness of breath and control the cough. A cool mist vaporizer may also be used to help thin bronchial secretions and make it easier to clear the chest.  HOME CARE INSTRUCTIONS  Get plenty of rest.   Drink enough fluids to keep your urine clear or pale yellow (unless you have a medical condition that requires fluid restriction). Increasing fluids may help thin your secretions and will prevent dehydration.   Only take over-the-counter or prescription medicines as directed by your health care provider.   Avoid smoking and secondhand smoke. Exposure to cigarette smoke or irritating chemicals will make bronchitis worse. If you are a smoker, consider using nicotine gum or skin  patches to help control withdrawal symptoms. Quitting smoking will help your lungs heal faster.   Reduce the chances of another bout of acute bronchitis by washing your hands frequently, avoiding people with cold symptoms, and trying not to touch your hands to your mouth, nose, or eyes.   Follow up with your health care provider as directed.  SEEK MEDICAL CARE IF: Your symptoms do not improve after 1 week of treatment.  SEEK IMMEDIATE MEDICAL CARE IF:  You develop an increased fever or chills.   You have chest pain.   You have severe shortness of breath.  You have bloody sputum.   You develop dehydration.  You develop fainting.  You develop repeated vomiting.  You develop a severe headache. MAKE SURE YOU:   Understand these instructions.  Will watch your condition.  Will get help right away if you are not doing well or get worse. Document Released: 01/01/2005 Document Revised: 07/27/2013 Document Reviewed: 05/17/2013 [employer] Patient Information 2014 [employer].  

## 2014-04-22 NOTE — Progress Notes (Signed)
Subjective:    Patient ID: [name], male    DOB: 09/29/1966, 48 y.o.   MRN: [id number] Chief Complaint  Patient presents with  . Cough    yellow mucous   . Shortness of Breath    HPI  10 d of worsening cough, no fever or chills but did get hoarse, cough productive of yellow and white mucous - tried delsym and some other otc decongestants w/o relief. Not sleeping well due to cough.  Is having some chest pain with cough so using ibuprofen - 4 tabs every 4-6 hours.  No known sick contacts, slight pharyngitis, no rhinitis or ear pain.  Not getting as much up as he wants.  Has used inhalers previously but didn't really help. Is similar to prior bronchitis.  Past Medical History  Diagnosis Date  . Postural lightheadedness   . Chest wall pain   . Obstructive sleep apnea     apnealink 10/23/10 AHI 5, Sp02 low 87%  . Headache(784.0)   . Irritable bowel syndrome   . GERD (gastroesophageal reflux disease)   . Small bowel obstruction   . Diarrhea recurrent  . Cough    Current Outpatient Prescriptions on File Prior to Visit  Medication Sig Dispense Refill  . esomeprazole (NEXIUM) 40 MG capsule Take 40 mg by mouth daily before breakfast. As needed       . ibuprofen (ADVIL,MOTRIN) 200 MG tablet Take 400 mg by mouth every 6 (six) hours as needed for pain.      . traMADol (ULTRAM) 50 MG tablet Take 1 tablet (50 mg total) by mouth every 8 (eight) hours as needed.  30 tablet  0   No current facility-administered medications on file prior to visit.   No Known Allergies  Review of Systems  Constitutional: Negative for fever and chills.  HENT: Positive for congestion, postnasal drip, sore throat and voice change. Negative for ear discharge, ear pain, hearing loss and rhinorrhea.   Respiratory: Positive for cough and shortness of breath.   Cardiovascular: Negative for chest pain.  Gastrointestinal: Negative for nausea and vomiting.  Hematological: Negative for adenopathy.    Psychiatric/Behavioral: Positive for sleep disturbance.      BP 120/68  Pulse 88  Temp(Src) 98 F (36.7 C) (Oral)  Resp 18  Ht 6' 0.5" (1.842 m)  Wt 199 lb (90.266 kg)  BMI 26.60 kg/m2  SpO2 98% Objective:   Physical Exam  Constitutional: He is oriented to person, place, and time. He appears well-developed and well-nourished. No distress.  HENT:  Head: Normocephalic and atraumatic.  Right Ear: External ear and ear canal normal. Tympanic membrane is retracted. A middle ear effusion is present.  Left Ear: External ear and ear canal normal. Tympanic membrane is retracted. A middle ear effusion is present.  Nose: Mucosal edema and rhinorrhea present. Right sinus exhibits maxillary sinus tenderness. Left sinus exhibits maxillary sinus tenderness.  Mouth/Throat: Uvula is midline and mucous membranes are normal. Posterior oropharyngeal erythema present. No oropharyngeal exudate or posterior oropharyngeal edema.  Eyes: Conjunctivae are normal. Right eye exhibits no discharge. Left eye exhibits no discharge. No scleral icterus.  Neck: Normal range of motion. Neck supple. No thyromegaly present.  Cardiovascular: Normal rate, regular rhythm, normal heart sounds and intact distal pulses.   Pulmonary/Chest: Effort normal and breath sounds normal. No respiratory distress.  Lymphadenopathy:       Head (right side): Submandibular adenopathy present.       Head (left side): Submandibular adenopathy present.  He has no cervical adenopathy.       Right: No supraclavicular adenopathy present.       Left: No supraclavicular adenopathy present.  Neurological: He is alert and oriented to person, place, and time.  Skin: Skin is warm and dry. He is not diaphoretic. No erythema.  Psychiatric: He has a normal mood and affect. His behavior is normal.          Assessment & Plan:   Cough  Acute bronchitis  Meds ordered this encounter  Medications  . azithromycin (ZITHROMAX) 250 MG tablet     Sig: Take 2 tabs PO x 1 dose, then 1 tab PO QD x 4 days    Dispense:  6 tablet    Refill:  0  . chlorpheniramine-HYDROcodone (TUSSIONEX PENNKINETIC ER) 10-8 MG/5ML LQCR    Sig: Take 5 mLs by mouth every 12 (twelve) hours as needed.    Dispense:  120 mL    Refill:  0  . benzonatate (TESSALON) 200 MG capsule    Sig: Take 1 capsule (200 mg total) by mouth 3 (three) times daily as needed for cough.    Dispense:  40 capsule    Refill:  0  . Guaifenesin (MUCINEX MAXIMUM STRENGTH) 1200 MG TB12    Sig: Take 1 tablet (1,200 mg total) by mouth every 12 (twelve) hours as needed.    Dispense:  14 tablet    Refill:  1    Norberto SorensonEva Shaw, MD MPH

## 2014-06-04 ENCOUNTER — Ambulatory Visit (INDEPENDENT_AMBULATORY_CARE_PROVIDER_SITE_OTHER): Payer: BC Managed Care – PPO | Admitting: Family Medicine

## 2014-06-04 VITALS — BP 116/74 | HR 69 | Temp 98.0°F | Resp 18 | Ht 70.5 in | Wt 201.0 lb

## 2014-06-04 DIAGNOSIS — Z0000 Encounter for general adult medical examination without abnormal findings: Secondary | ICD-10-CM

## 2014-06-04 DIAGNOSIS — R5381 Other malaise: Secondary | ICD-10-CM

## 2014-06-04 DIAGNOSIS — G4733 Obstructive sleep apnea (adult) (pediatric): Secondary | ICD-10-CM

## 2014-06-04 DIAGNOSIS — R319 Hematuria, unspecified: Secondary | ICD-10-CM

## 2014-06-04 DIAGNOSIS — G4700 Insomnia, unspecified: Secondary | ICD-10-CM

## 2014-06-04 DIAGNOSIS — M542 Cervicalgia: Secondary | ICD-10-CM

## 2014-06-04 DIAGNOSIS — IMO0001 Reserved for inherently not codable concepts without codable children: Secondary | ICD-10-CM

## 2014-06-04 DIAGNOSIS — R5383 Other fatigue: Secondary | ICD-10-CM

## 2014-06-04 DIAGNOSIS — K219 Gastro-esophageal reflux disease without esophagitis: Secondary | ICD-10-CM

## 2014-06-04 DIAGNOSIS — M791 Myalgia, unspecified site: Secondary | ICD-10-CM

## 2014-06-04 LAB — POCT URINALYSIS DIPSTICK
Bilirubin, UA: NEGATIVE
Glucose, UA: NEGATIVE
KETONES UA: NEGATIVE
Leukocytes, UA: NEGATIVE
Nitrite, UA: NEGATIVE
PH UA: 7
PROTEIN UA: NEGATIVE
SPEC GRAV UA: 1.015
UROBILINOGEN UA: 0.2

## 2014-06-04 NOTE — Progress Notes (Signed)
Subjective:    Patient ID: [name], male    DOB: 04/15/1966, 48 y.o.   MRN: [id number]  HPI [name] is a 48 y.o. male PCP: GUEST, Loretha StaplerHRIS WARREN, MD, but not seen by him in a few years.    Prostate cancer screening/last PSA:  No results found for this basename: PSA  thinks was few years ago - normal. Agrees to PSA, but not DRE.  Tetanus/Tdap: thinks had this vaccine last 10 years, but unknown exact year.  More than 5 years.  Dentist: last visit 2 years ago. Plans on scheduling.  Optho/eye care eval: last seen last year - due for follow up.  No regular exercise.   Fatigue - some decreased sleep, working 2nd shift and doing classes online, and other responsibilities. Only sleeping 3-4 hours at times at night. Past 2 days sleeping more because not working.  Mind racing at times or stays awake at times if trying to get tot sleep or wake up frequently at times.  Long time ago took sleeping pill, does not want to take one of these now.  Pressure form school and work, but not feeling depressed or anxious. Diagnosed with OSA in past - mild with AHI 5 in 10/2010.  Tried dental appliance - didn't help, so stopped using the appliance. No recent use. No energy and tired during the day.   GERD - No known hx of PUD, just heartburn - since 2008 takes Nexium QD. History of abdominal surgeries, and scarring from that - bowel obstructions.  Last surgery 2009. abd pain last June, no surgery needed. Resolved on own.   Hx of hematuria - work up 5 years ago with CT scans, etc - told normal, and no follow up needed.    Patient Active Problem List   Diagnosis Date Noted  . Partial small bowel obstruction 05/07/2013  . ALLERGIC RHINITIS 02/07/2011  . DYSPNEA 02/07/2011  . CHEST PAIN, PRECORDIAL 02/05/2011  . POSTURAL LIGHTHEADEDNESS 01/16/2011  . OBSTRUCTIVE SLEEP APNEA 10/23/2010  . Cough 08/20/2010  . GERD 03/12/2010  . Irritable bowel syndrome 03/12/2010  . DIARRHEA, RECURRENT 02/05/2010     Past Medical History  Diagnosis Date  . Postural lightheadedness   . Chest wall pain   . Obstructive sleep apnea     apnealink 10/23/10 AHI 5, Sp02 low 87%  . Headache(784.0)   . Irritable bowel syndrome   . GERD (gastroesophageal reflux disease)   . Small bowel obstruction   . Diarrhea recurrent  . Cough    Past Surgical History  Procedure Laterality Date  . Colectomy  1994    segmental  . Laparotomy  2002, 2005, 2009  . Gastrostomy  2005    stamm  . Cholecystectomy  2009  . Sphincterotomy  2009    lateral internal  . Colonoscopy  07/17/2011    hemorrhoids, otherwise normal including terminal ileum and random colon biopises  . Upper gastrointestinal endoscopy  07/17/2011    normal, including duodenal biopsies   No Known Allergies Prior to Admission medications   Medication Sig Start Date End Date Taking? Authorizing Provider  esomeprazole (NEXIUM) 40 MG capsule Take 40 mg by mouth daily before breakfast. As needed    Yes Historical Provider, MD  ibuprofen (ADVIL,MOTRIN) 200 MG tablet Take 400 mg by mouth every 6 (six) hours as needed for pain.    Historical Provider, MD   History   Social History  . Marital Status: Married    Spouse Name:  N/A    Number of Children: N/A  . Years of Education: N/A   Occupational History  . Not on file.   Social History Main Topics  . Smoking status: Never Smoker   . Smokeless tobacco: Never Used  . Alcohol Use: No  . Drug Use: No  . Sexual Activity: Not on file   Other Topics Concern  . Not on file   Social History Narrative   Occupation: Group Engineer, [employer] (deliveries)   Patient has never smoked.   Alcohol Use-no   Illicit Drug Use-no   Married-Divorced-Married again 2009- wife is in [place] with infant son   5yo and 1yo at home. Home going well.     Review of Systems  Constitutional: Positive for appetite change (with fatigue past few weeks) and fatigue. Negative for fever, chills, diaphoresis and unexpected  weight change.  Eyes: Positive for visual disturbance (followed by eye doctor, less acuity with age. ).  Cardiovascular: Negative for chest pain (additionally no FH of cardiac dz.).  Genitourinary: Negative for urgency, hematuria and difficulty urinating.  Musculoskeletal: Positive for arthralgias, myalgias and neck pain.  All other systems reviewed and are negative.  13 point review of systems per patient health survey noted.  Negative other than as indicated above.      Objective:   Physical Exam  Vitals reviewed. Constitutional: He is oriented to person, place, and time. He appears well-developed and well-nourished.  HENT:  Head: Normocephalic and atraumatic.  Right Ear: External ear normal.  Left Ear: External ear normal.  Mouth/Throat: Oropharynx is clear and moist.  Eyes: Conjunctivae and EOM are normal. Pupils are equal, round, and reactive to light.  Neck: Normal range of motion. Neck supple. No thyromegaly present.  Cardiovascular: Normal rate, regular rhythm, normal heart sounds and intact distal pulses.   Pulmonary/Chest: Effort normal and breath sounds normal. No respiratory distress. He has no wheezes.  Abdominal: Soft. He exhibits no distension. There is no tenderness. Hernia confirmed negative in the right inguinal area and confirmed negative in the left inguinal area.  Genitourinary: Prostate normal.  Musculoskeletal: Normal range of motion. He exhibits no edema and no tenderness.  Lymphadenopathy:    He has no cervical adenopathy.  Neurological: He is alert and oriented to person, place, and time. He has normal reflexes.  Skin: Skin is warm and dry.  Psychiatric: He has a normal mood and affect. His behavior is normal.   Filed Vitals:   06/04/14 1557  BP: 116/74  Pulse: 69  Temp: 98 F (36.7 C)  Resp: 18  Height: 5' 10.5" (1.791 m)  Weight: 201 lb (91.173 kg)  SpO2: 100%    Visual Acuity Screening   Right eye Left eye Both eyes  Without correction:       With correction: 20/25 20/20 20/20      Results for orders placed in visit on 06/04/14  POCT URINALYSIS DIPSTICK      Result Value Ref Range   Color, UA yellow     Clarity, UA clear     Glucose, UA neg     Bilirubin, UA neg     Ketones, UA neg     Spec Grav, UA 1.015     Blood, UA small     pH, UA 7.0     Protein, UA neg     Urobilinogen, UA 0.2     Nitrite, UA neg     Leukocytes, UA Negative  Assessment & Plan:  [name] is a 48 y.o. male Annual physical exam - Plan: CBC, PSA, TSH, Comprehensive metabolic panel, Lipid panel, POCT urinalysis dipstick  --anticipatory guidance as below in AVS, screening labs above. Health maintenance items as above in HPI discussed/recommended as applicable.   - if tetanus not w/in 10 yrs - will return for tdap.   OSA (obstructive sleep apnea) , Other malaise and fatigue, Insomnia - Plan: Ambulatory referral to Sleep Studies, TSH, CBC  -hx of mild OSA by report, and suspect some fatigue with decreased amount of sleep, but frequent wakening - may be OSA as well.  Repeat sleep study ordered and sleep hygiene discussed.   Neck pain, Myalgia - Plan: CBC, CK   may have some paraspinal pain/spasm in neck with work at computer - neck care manual for HEP,  Intermittent pains in calves - no edema/sts. Check CPK and follow up to discuss further.   Hematuria - eval prior by report without recommended follow up. Can check into this some at future ov.   Gastroesophageal reflux disease, esophagitis presence not specified  - avoidance of trigger foods discussed. Cont nexium 40mg  qd.   No orders of the defined types were placed in this encounter.   Patient Instructions  You should receive a call or letter about your lab results within the next week to 10 days.  We will refer you for repeat sleep studies. See information below on insomnia.  Tylenol for muscle aches. Return to discuss muscle and joint aches further.  Check on tetanus - if not  in past 10 years, can have this done next visit.  Recheck in next 6 weeks - can repeat urine test then as small amount of blood seen in urine.  If you notice darker urine or blood in the urine - return sooner.   Keeping you healthy  Get these tests  Blood pressure- Have your blood pressure checked once a year by your healthcare provider.  Normal blood pressure is 120/80.  Weight- Have your body mass index (BMI) calculated to screen for obesity.  BMI is a measure of body fat based on height and weight. You can also calculate your own BMI at https://www.west-esparza.com/.  Cholesterol- Have your cholesterol checked regularly starting at age 44, sooner may be necessary if you have diabetes, high blood pressure, if a family member developed heart diseases at an early age or if you smoke.   Chlamydia, HIV, and other sexual transmitted disease- Get screened each year until the age of 63 then within three months of each new sexual partner.  Diabetes- Have your blood sugar checked regularly if you have high blood pressure, high cholesterol, a family history of diabetes or if you are overweight.  Get these vaccines  Flu shot- Every fall.  Tetanus shot- Every 10 years.  Menactra- Single dose; prevents meningitis.  Take these steps  Don't smoke- If you do smoke, ask your healthcare provider about quitting. For tips on how to quit, go to www.smokefree.gov or call 1-800-QUIT-NOW.  Be physically active- Exercise 5 days a week for at least 30 minutes.  If you are not already physically active start slow and gradually work up to 30 minutes of moderate physical activity.  Examples of moderate activity include walking briskly, mowing the yard, dancing, swimming bicycling, etc.  Eat a healthy diet- Eat a variety of healthy foods such as fruits, vegetables, low fat milk, low fat cheese, yogurt, lean meats, poultry, fish, beans, tofu, etc.  For more information on healthy eating, go to  www.thenutritionsource.org  Drink alcohol in moderation- Limit alcohol intake two drinks or less a day.  Never drink and drive.  Dentist- Brush and floss teeth twice daily; visit your dentis twice a year.  Depression-Your emotional health is as important as your physical health.  If you're feeling down, losing interest in things you normally enjoy please talk with your healthcare provider.  Gun Safety- If you keep a gun in your home, keep it unloaded and with the safety lock on.  Bullets should be stored separately.  Helmet use- Always wear a helmet when riding a motorcycle, bicycle, rollerblading or skateboarding.  Safe sex- If you may be exposed to a sexually transmitted infection, use a condom  Seat belts- Seat bels can save your life; always wear one.  Smoke/Carbon Monoxide detectors- These detectors need to be installed on the appropriate level of your home.  Replace batteries at least once a year.  Skin Cancer- When out in the sun, cover up and use sunscreen SPF 15 or higher.  Violence- If anyone is threatening or hurting you, please tell your healthcare provider.  Insomnia Insomnia is frequent trouble falling and/or staying asleep. Insomnia can be a long term problem or a short term problem. Both are common. Insomnia can be a short term problem when the wakefulness is related to a certain stress or worry. Long term insomnia is often related to ongoing stress during waking hours and/or poor sleeping habits. Overtime, sleep deprivation itself can make the problem worse. Every little thing feels more severe because you are overtired and your ability to cope is decreased. CAUSES   Stress, anxiety, and depression.  Poor sleeping habits.  Distractions such as TV in the bedroom.  Naps close to bedtime.  Engaging in emotionally charged conversations before bed.  Technical reading before sleep.  Alcohol and other sedatives. They may make the problem worse. They can hurt normal  sleep patterns and normal dream activity.  Stimulants such as caffeine for several hours prior to bedtime.  Pain syndromes and shortness of breath can cause insomnia.  Exercise late at night.  Changing time zones may cause sleeping problems (jet lag). It is sometimes helpful to have someone observe your sleeping patterns. They should look for periods of not breathing during the night (sleep apnea). They should also look to see how long those periods last. If you live alone or observers are uncertain, you can also be observed at a sleep clinic where your sleep patterns will be professionally monitored. Sleep apnea requires a checkup and treatment. Give your caregivers your medical history. Give your caregivers observations your family has made about your sleep.  SYMPTOMS   Not feeling rested in the morning.  Anxiety and restlessness at bedtime.  Difficulty falling and staying asleep. TREATMENT   Your caregiver may prescribe treatment for an underlying medical disorders. Your caregiver can give advice or help if you are using alcohol or other drugs for self-medication. Treatment of underlying problems will usually eliminate insomnia problems.  Medications can be prescribed for short time use. They are generally not recommended for lengthy use.  Over-the-counter sleep medicines are not recommended for lengthy use. They can be habit forming.  You can promote easier sleeping by making lifestyle changes such as:  Using relaxation techniques that help with breathing and reduce muscle tension.  Exercising earlier in the day.  Changing your diet and the time of your last meal. No night time snacks.  Establish a regular time to go to bed.  Counseling can help with stressful problems and worry.  Soothing music and white noise may be helpful if there are background noises you cannot remove.  Stop tedious detailed work at least one hour before bedtime. HOME CARE INSTRUCTIONS   Keep a  diary. Inform your caregiver about your progress. This includes any medication side effects. See your caregiver regularly. Take note of:  Times when you are asleep.  Times when you are awake during the night.  The quality of your sleep.  How you feel the next day. This information will help your caregiver care for you.  Get out of bed if you are still awake after 15 minutes. Read or do some quiet activity. Keep the lights down. Wait until you feel sleepy and go back to bed.  Keep regular sleeping and waking hours. Avoid naps.  Exercise regularly.  Avoid distractions at bedtime. Distractions include watching television or engaging in any intense or detailed activity like attempting to balance the household checkbook.  Develop a bedtime ritual. Keep a familiar routine of bathing, brushing your teeth, climbing into bed at the same time each night, listening to soothing music. Routines increase the success of falling to sleep faster.  Use relaxation techniques. This can be using breathing and muscle tension release routines. It can also include visualizing peaceful scenes. You can also help control troubling or intruding thoughts by keeping your mind occupied with boring or repetitive thoughts like the old concept of counting sheep. You can make it more creative like imagining planting one beautiful flower after another in your backyard garden.  During your day, work to eliminate stress. When this is not possible use some of the previous suggestions to help reduce the anxiety that accompanies stressful situations. MAKE SURE YOU:   Understand these instructions.  Will watch your condition.  Will get help right away if you are not doing well or get worse. Document Released: 11/21/2000 Document Revised: 02/16/2012 Document Reviewed: 12/22/2007 [facility] Patient Information 2015 [place], [place]. This information is not intended to replace advice given to you by your health care provider. Make  sure you discuss any questions you have with your health care provider.

## 2014-06-04 NOTE — Patient Instructions (Addendum)
You should receive a call or letter about your lab results within the next week to 10 days.  We will refer you for repeat sleep studies. See information below on insomnia.  Tylenol for muscle aches. Return to discuss muscle and joint aches further.  Check on tetanus - if not in past 10 years, can have this done next visit.  Recheck in next 6 weeks - can repeat urine test then as small amount of blood seen in urine.  If you notice darker urine or blood in the urine - return sooner.   Keeping you healthy  Get these tests  Blood pressure- Have your blood pressure checked once a year by your healthcare provider.  Normal blood pressure is 120/80.  Weight- Have your body mass index (BMI) calculated to screen for obesity.  BMI is a measure of body fat based on height and weight. You can also calculate your own BMI at https://www.west-esparza.com/www.nhlbisupport.com/bmi/.  Cholesterol- Have your cholesterol checked regularly starting at age 46, sooner may be necessary if you have diabetes, high blood pressure, if a family member developed heart diseases at an early age or if you smoke.   Chlamydia, HIV, and other sexual transmitted disease- Get screened each year until the age of 46 then within three months of each new sexual partner.  Diabetes- Have your blood sugar checked regularly if you have high blood pressure, high cholesterol, a family history of diabetes or if you are overweight.  Get these vaccines  Flu shot- Every fall.  Tetanus shot- Every 10 years.  Menactra- Single dose; prevents meningitis.  Take these steps  Don't smoke- If you do smoke, ask your healthcare provider about quitting. For tips on how to quit, go to www.smokefree.gov or call 1-800-QUIT-NOW.  Be physically active- Exercise 5 days a week for at least 30 minutes.  If you are not already physically active start slow and gradually work up to 30 minutes of moderate physical activity.  Examples of moderate activity include walking briskly, mowing  the yard, dancing, swimming bicycling, etc.  Eat a healthy diet- Eat a variety of healthy foods such as fruits, vegetables, low fat milk, low fat cheese, yogurt, lean meats, poultry, fish, beans, tofu, etc.  For more information on healthy eating, go to www.thenutritionsource.org  Drink alcohol in moderation- Limit alcohol intake two drinks or less a day.  Never drink and drive.  Dentist- Brush and floss teeth twice daily; visit your dentis twice a year.  Depression-Your emotional health is as important as your physical health.  If you're feeling down, losing interest in things you normally enjoy please talk with your healthcare provider.  Gun Safety- If you keep a gun in your home, keep it unloaded and with the safety lock on.  Bullets should be stored separately.  Helmet use- Always wear a helmet when riding a motorcycle, bicycle, rollerblading or skateboarding.  Safe sex- If you may be exposed to a sexually transmitted infection, use a condom  Seat belts- Seat bels can save your life; always wear one.  Smoke/Carbon Monoxide detectors- These detectors need to be installed on the appropriate level of your home.  Replace batteries at least once a year.  Skin Cancer- When out in the sun, cover up and use sunscreen SPF 15 or higher.  Violence- If anyone is threatening or hurting you, please tell your healthcare provider.  Insomnia Insomnia is frequent trouble falling and/or staying asleep. Insomnia can be a long term problem or a short term problem.  Both are common. Insomnia can be a short term problem when the wakefulness is related to a certain stress or worry. Long term insomnia is often related to ongoing stress during waking hours and/or poor sleeping habits. Overtime, sleep deprivation itself can make the problem worse. Every little thing feels more severe because you are overtired and your ability to cope is decreased. CAUSES   Stress, anxiety, and depression.  Poor sleeping  habits.  Distractions such as TV in the bedroom.  Naps close to bedtime.  Engaging in emotionally charged conversations before bed.  Technical reading before sleep.  Alcohol and other sedatives. They may make the problem worse. They can hurt normal sleep patterns and normal dream activity.  Stimulants such as caffeine for several hours prior to bedtime.  Pain syndromes and shortness of breath can cause insomnia.  Exercise late at night.  Changing time zones may cause sleeping problems (jet lag). It is sometimes helpful to have someone observe your sleeping patterns. They should look for periods of not breathing during the night (sleep apnea). They should also look to see how long those periods last. If you live alone or observers are uncertain, you can also be observed at a sleep clinic where your sleep patterns will be professionally monitored. Sleep apnea requires a checkup and treatment. Give your caregivers your medical history. Give your caregivers observations your family has made about your sleep.  SYMPTOMS   Not feeling rested in the morning.  Anxiety and restlessness at bedtime.  Difficulty falling and staying asleep. TREATMENT   Your caregiver may prescribe treatment for an underlying medical disorders. Your caregiver can give advice or help if you are using alcohol or other drugs for self-medication. Treatment of underlying problems will usually eliminate insomnia problems.  Medications can be prescribed for short time use. They are generally not recommended for lengthy use.  Over-the-counter sleep medicines are not recommended for lengthy use. They can be habit forming.  You can promote easier sleeping by making lifestyle changes such as:  Using relaxation techniques that help with breathing and reduce muscle tension.  Exercising earlier in the day.  Changing your diet and the time of your last meal. No night time snacks.  Establish a regular time to go to  bed.  Counseling can help with stressful problems and worry.  Soothing music and white noise may be helpful if there are background noises you cannot remove.  Stop tedious detailed work at least one hour before bedtime. HOME CARE INSTRUCTIONS   Keep a diary. Inform your caregiver about your progress. This includes any medication side effects. See your caregiver regularly. Take note of:  Times when you are asleep.  Times when you are awake during the night.  The quality of your sleep.  How you feel the next day. This information will help your caregiver care for you.  Get out of bed if you are still awake after 15 minutes. Read or do some quiet activity. Keep the lights down. Wait until you feel sleepy and go back to bed.  Keep regular sleeping and waking hours. Avoid naps.  Exercise regularly.  Avoid distractions at bedtime. Distractions include watching television or engaging in any intense or detailed activity like attempting to balance the household checkbook.  Develop a bedtime ritual. Keep a familiar routine of bathing, brushing your teeth, climbing into bed at the same time each night, listening to soothing music. Routines increase the success of falling to sleep faster.  Use relaxation techniques.  This can be using breathing and muscle tension release routines. It can also include visualizing peaceful scenes. You can also help control troubling or intruding thoughts by keeping your mind occupied with boring or repetitive thoughts like the old concept of counting sheep. You can make it more creative like imagining planting one beautiful flower after another in your backyard garden.  During your day, work to eliminate stress. When this is not possible use some of the previous suggestions to help reduce the anxiety that accompanies stressful situations. MAKE SURE YOU:   Understand these instructions.  Will watch your condition.  Will get help right away if you are not doing  well or get worse. Document Released: 11/21/2000 Document Revised: 02/16/2012 Document Reviewed: 12/22/2007 [facility] Patient Information 2015 [place], [place]. This information is not intended to replace advice given to you by your health care provider. Make sure you discuss any questions you have with your health care provider.

## 2014-06-05 ENCOUNTER — Other Ambulatory Visit: Payer: Self-pay | Admitting: Family Medicine

## 2014-06-05 DIAGNOSIS — K219 Gastro-esophageal reflux disease without esophagitis: Secondary | ICD-10-CM

## 2014-06-05 LAB — COMPREHENSIVE METABOLIC PANEL
ALK PHOS: 62 U/L (ref 39–117)
ALT: 22 U/L (ref 0–53)
AST: 21 U/L (ref 0–37)
Albumin: 4.5 g/dL (ref 3.5–5.2)
BILIRUBIN TOTAL: 0.6 mg/dL (ref 0.2–1.2)
BUN: 11 mg/dL (ref 6–23)
CO2: 29 meq/L (ref 19–32)
CREATININE: 0.94 mg/dL (ref 0.50–1.35)
Calcium: 9.4 mg/dL (ref 8.4–10.5)
Chloride: 101 mEq/L (ref 96–112)
GLUCOSE: 102 mg/dL — AB (ref 70–99)
Potassium: 4.8 mEq/L (ref 3.5–5.3)
SODIUM: 137 meq/L (ref 135–145)
TOTAL PROTEIN: 7.4 g/dL (ref 6.0–8.3)

## 2014-06-05 LAB — CBC
HEMATOCRIT: 40.9 % (ref 39.0–52.0)
Hemoglobin: 14.4 g/dL (ref 13.0–17.0)
MCH: 28.6 pg (ref 26.0–34.0)
MCHC: 35.2 g/dL (ref 30.0–36.0)
MCV: 81.3 fL (ref 78.0–100.0)
PLATELETS: 223 10*3/uL (ref 150–400)
RBC: 5.03 MIL/uL (ref 4.22–5.81)
RDW: 13.6 % (ref 11.5–15.5)
WBC: 4.2 10*3/uL (ref 4.0–10.5)

## 2014-06-05 LAB — LIPID PANEL
CHOL/HDL RATIO: 3.3 ratio
CHOLESTEROL: 116 mg/dL (ref 0–200)
HDL: 35 mg/dL — ABNORMAL LOW (ref >39)
LDL CALC: 56 mg/dL (ref 0–99)
TRIGLYCERIDES: 123 mg/dL (ref <150)
VLDL: 25 mg/dL (ref 0–40)

## 2014-06-05 LAB — CK: Total CK: 168 U/L (ref 7–232)

## 2014-06-05 LAB — TSH: TSH: 0.574 u[IU]/mL (ref 0.350–4.500)

## 2014-06-06 LAB — PSA: PSA: 0.34 ng/mL (ref <=4.00)

## 2014-06-06 NOTE — Telephone Encounter (Signed)
Dr Neva SeatGreene, you discussed this med w/pt at OV last week and want him to cont it, but I don't believe we have Rxd for pt before. OK to give RFs?

## 2014-06-07 NOTE — Telephone Encounter (Signed)
Ok to refill - approved.

## 2014-06-16 ENCOUNTER — Encounter: Payer: Self-pay | Admitting: *Deleted

## 2014-07-09 ENCOUNTER — Telehealth: Payer: Self-pay

## 2014-07-09 NOTE — Telephone Encounter (Signed)
Patient called very upset because he received a bill. He states that he came in for CPE and now he is receiving a bill. I explained to him that the physical was covered at 100%, but at the time he checked in he stated that he had insomnia and dark urine, which indicates an issue and is not covered under a CPE. I advised patient that he could contact the billing office during business hours if he wants to dispute the charges.   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

## 2014-07-11 NOTE — Telephone Encounter (Signed)
lmom for pt to cb

## 2014-07-11 NOTE — Telephone Encounter (Signed)
Spoke with pt

## 2014-07-11 NOTE — Telephone Encounter (Signed)
Spoke with pt. He is concerned about a bill he received from us. Says he spoke with someone in our billing dept yesterday??, but I didn't see anything, Is there anyway someone could give him a call about his bill please. Thanks so much

## 2015-12-09 DIAGNOSIS — K56609 Unspecified intestinal obstruction, unspecified as to partial versus complete obstruction: Secondary | ICD-10-CM

## 2015-12-09 HISTORY — DX: Unspecified intestinal obstruction, unspecified as to partial versus complete obstruction: K56.609

## 2016-01-02 ENCOUNTER — Emergency Department (HOSPITAL_COMMUNITY): Payer: BLUE CROSS/BLUE SHIELD

## 2016-01-02 ENCOUNTER — Encounter (HOSPITAL_COMMUNITY): Payer: Self-pay

## 2016-01-02 ENCOUNTER — Inpatient Hospital Stay (HOSPITAL_COMMUNITY)
Admission: EM | Admit: 2016-01-02 | Discharge: 2016-01-06 | DRG: 390 | Disposition: A | Discharge status: Home / Self Care | Payer: BLUE CROSS/BLUE SHIELD | Attending: Surgery | Admitting: Surgery

## 2016-01-02 DIAGNOSIS — K760 Fatty (change of) liver, not elsewhere classified: Secondary | ICD-10-CM | POA: Diagnosis present

## 2016-01-02 DIAGNOSIS — K589 Irritable bowel syndrome without diarrhea: Secondary | ICD-10-CM | POA: Diagnosis present

## 2016-01-02 DIAGNOSIS — E860 Dehydration: Secondary | ICD-10-CM | POA: Diagnosis present

## 2016-01-02 DIAGNOSIS — K5669 Other intestinal obstruction: Secondary | ICD-10-CM | POA: Diagnosis present

## 2016-01-02 DIAGNOSIS — N289 Disorder of kidney and ureter, unspecified: Secondary | ICD-10-CM | POA: Diagnosis present

## 2016-01-02 DIAGNOSIS — K219 Gastro-esophageal reflux disease without esophagitis: Secondary | ICD-10-CM | POA: Diagnosis present

## 2016-01-02 DIAGNOSIS — K5660 Unspecified intestinal obstruction: Principal | ICD-10-CM | POA: Diagnosis present

## 2016-01-02 DIAGNOSIS — G4733 Obstructive sleep apnea (adult) (pediatric): Secondary | ICD-10-CM | POA: Diagnosis present

## 2016-01-02 DIAGNOSIS — K56609 Unspecified intestinal obstruction, unspecified as to partial versus complete obstruction: Secondary | ICD-10-CM | POA: Diagnosis present

## 2016-01-02 HISTORY — DX: Unspecified intestinal obstruction, unspecified as to partial versus complete obstruction: K56.609

## 2016-01-02 LAB — URINE MICROSCOPIC-ADD ON

## 2016-01-02 LAB — URINALYSIS, ROUTINE W REFLEX MICROSCOPIC
Bilirubin Urine: NEGATIVE
Glucose, UA: NEGATIVE mg/dL
Ketones, ur: 15 mg/dL — AB
Leukocytes, UA: NEGATIVE
Nitrite: NEGATIVE
Protein, ur: 30 mg/dL — AB
Specific Gravity, Urine: >1.046 — ABNORMAL HIGH (ref 1.005–1.030)
pH: 7.5 (ref 5.0–8.0)

## 2016-01-02 LAB — CBC WITH DIFFERENTIAL/PLATELET
Basophils Absolute: 0 K/uL (ref 0.0–0.1)
Basophils Relative: 0 %
Eosinophils Absolute: 0 K/uL (ref 0.0–0.7)
Eosinophils Relative: 0 %
HCT: 47.7 % (ref 39.0–52.0)
Hemoglobin: 16.7 g/dL (ref 13.0–17.0)
Lymphocytes Relative: 17 %
Lymphs Abs: 1.3 K/uL (ref 0.7–4.0)
MCH: 29.3 pg (ref 26.0–34.0)
MCHC: 35 g/dL (ref 30.0–36.0)
MCV: 83.8 fL (ref 78.0–100.0)
Monocytes Absolute: 0.3 K/uL (ref 0.1–1.0)
Monocytes Relative: 5 %
Neutro Abs: 5.9 K/uL (ref 1.7–7.7)
Neutrophils Relative %: 78 %
Platelets: 228 K/uL (ref 150–400)
RBC: 5.69 MIL/uL (ref 4.22–5.81)
RDW: 12.8 % (ref 11.5–15.5)
WBC: 7.6 K/uL (ref 4.0–10.5)

## 2016-01-02 LAB — COMPREHENSIVE METABOLIC PANEL WITH GFR
ALT: 43 U/L (ref 17–63)
AST: 31 U/L (ref 15–41)
Albumin: 5.4 g/dL — ABNORMAL HIGH (ref 3.5–5.0)
Alkaline Phosphatase: 76 U/L (ref 38–126)
Anion gap: 13 (ref 5–15)
BUN: 13 mg/dL (ref 6–20)
CO2: 25 mmol/L (ref 22–32)
Calcium: 11.2 mg/dL — ABNORMAL HIGH (ref 8.9–10.3)
Chloride: 97 mmol/L — ABNORMAL LOW (ref 101–111)
Creatinine, Ser: 1.56 mg/dL — ABNORMAL HIGH (ref 0.61–1.24)
GFR calc Af Amer: 59 mL/min — ABNORMAL LOW
GFR calc non Af Amer: 51 mL/min — ABNORMAL LOW
Glucose, Bld: 164 mg/dL — ABNORMAL HIGH (ref 65–99)
Potassium: 4.4 mmol/L (ref 3.5–5.1)
Sodium: 135 mmol/L (ref 135–145)
Total Bilirubin: 1.6 mg/dL — ABNORMAL HIGH (ref 0.3–1.2)
Total Protein: 9.3 g/dL — ABNORMAL HIGH (ref 6.5–8.1)

## 2016-01-02 LAB — LIPASE, BLOOD: Lipase: 29 U/L (ref 11–51)

## 2016-01-02 MED ORDER — MORPHINE SULFATE (PF) 2 MG/ML IV SOLN
2.0000 mg | INTRAVENOUS | Status: DC | PRN
  Administered 2016-01-02 – 2016-01-03 (×2): 4 mg via INTRAVENOUS
  Administered 2016-01-05: 2 mg via INTRAVENOUS
  Filled 2016-01-02: qty 1
  Filled 2016-01-02 (×2): qty 2

## 2016-01-02 MED ORDER — IOHEXOL 300 MG/ML  SOLN
25.0000 mL | Freq: Once | INTRAMUSCULAR | Status: AC | PRN
  Administered 2016-01-02: 25 mL via ORAL

## 2016-01-02 MED ORDER — DIPHENHYDRAMINE HCL 25 MG PO CAPS: 25.0000 mg | ORAL_CAPSULE | Freq: Four times a day (QID) | ORAL | Status: DC | PRN

## 2016-01-02 MED ORDER — DIPHENHYDRAMINE HCL 50 MG/ML IJ SOLN: 25.0000 mg | Freq: Four times a day (QID) | INTRAMUSCULAR | Status: DC | PRN

## 2016-01-02 MED ORDER — ONDANSETRON 4 MG PO TBDP: 4.0000 mg | ORAL_TABLET | Freq: Four times a day (QID) | ORAL | Status: DC | PRN

## 2016-01-02 MED ORDER — POTASSIUM CHLORIDE IN NACL 20-0.9 MEQ/L-% IV SOLN
INTRAVENOUS | Status: DC
  Administered 2016-01-02 – 2016-01-05 (×7): via INTRAVENOUS
  Administered 2016-01-05 (×2): 1 mL via INTRAVENOUS
  Administered 2016-01-05: 09:00:00 via INTRAVENOUS
  Filled 2016-01-02 (×11): qty 1000

## 2016-01-02 MED ORDER — ONDANSETRON HCL 4 MG/2ML IJ SOLN
4.0000 mg | Freq: Once | INTRAMUSCULAR | Status: AC
  Administered 2016-01-02: 4 mg via INTRAVENOUS
  Filled 2016-01-02: qty 2

## 2016-01-02 MED ORDER — IOHEXOL 300 MG/ML  SOLN
75.0000 mL | Freq: Once | INTRAMUSCULAR | Status: AC | PRN
  Administered 2016-01-02: 75 mL via INTRAVENOUS

## 2016-01-02 MED ORDER — ONDANSETRON HCL 4 MG/2ML IJ SOLN
4.0000 mg | Freq: Four times a day (QID) | INTRAMUSCULAR | Status: DC | PRN
Start: 2016-01-02 — End: 2016-01-06
  Administered 2016-01-05: 4 mg via INTRAVENOUS
  Filled 2016-01-02: qty 2

## 2016-01-02 MED ORDER — SODIUM CHLORIDE 0.9 % IV BOLUS (SEPSIS)
1000.0000 mL | Freq: Once | INTRAVENOUS | Status: AC
  Administered 2016-01-02: 1000 mL via INTRAVENOUS

## 2016-01-02 MED ORDER — WHITE PETROLATUM GEL
Status: AC
  Administered 2016-01-03
  Filled 2016-01-02: qty 1

## 2016-01-02 MED ORDER — MORPHINE SULFATE (PF) 4 MG/ML IV SOLN
4.0000 mg | Freq: Once | INTRAVENOUS | Status: AC
  Administered 2016-01-02: 4 mg via INTRAVENOUS
  Filled 2016-01-02: qty 1

## 2016-01-02 MED ORDER — PANTOPRAZOLE SODIUM 40 MG IV SOLR
40.0000 mg | Freq: Once | INTRAVENOUS | Status: AC
  Administered 2016-01-02: 40 mg via INTRAVENOUS
  Filled 2016-01-02: qty 40

## 2016-01-02 MED ORDER — HEPARIN SODIUM (PORCINE) 5000 UNIT/ML IJ SOLN
5000.0000 [IU] | Freq: Three times a day (TID) | INTRAMUSCULAR | Status: DC
  Administered 2016-01-02 – 2016-01-06 (×12): 5000 [IU] via SUBCUTANEOUS
  Filled 2016-01-02 (×10): qty 1

## 2016-01-02 MED ORDER — KCL IN DEXTROSE-NACL 20-5-0.9 MEQ/L-%-% IV SOLN: INTRAVENOUS | Status: DC

## 2016-01-02 NOTE — H&P (Signed)
[name] is an 50 y.o. male.   Chief Complaint: abdominal pain, nausea and vomiting, no BM for 3 days.   HPI: Pt with prior hx of PSBO, last time was 05/07/13.  He has a hx of partial colectomy In [place] 1994. Prior SBO 10/2001, 05/2004, lap cholecystectomy with lysis of adhesions Stamm gastrostomy tube, Dr. Excell Seltzer, 02/2008. He now presents with Vomiting since 0430 this AM, pain and last BM about 3 days ago. Work up shows he is afebrile, and VSS.  Labs: creatinine and calcium are up, glucose is up.  WBC is 7.6, H/H up 16.7 and 47.7. CT scan shows:  Multiple dilated, fluid-filled small bowel loops in the mid abdomen. The proximal and distal small bowel are normal in caliber. No obstructing mass visualized. Normal caliber colon containing gas and stool. No evidence of appendicitis. No free peritoneal air or fluid. No bowel wall thickening or pneumatosis. Stable diffuse hepatic steatosis.    Past Medical History  Diagnosis Date  . Postural lightheadedness   . Chest wall pain   . Obstructive sleep apnea     apnealink 10/23/10 AHI 5, Sp02 low 87%  . Headache(784.0)   . Irritable bowel syndrome   . GERD (gastroesophageal reflux disease)   . Small bowel obstruction ([place])   . Diarrhea recurrent  . Cough     Past Surgical History  Procedure Laterality Date  . Colectomy  1994    segmental  . Laparotomy  2002, 2005, 2009  . Gastrostomy  2005    stamm  . Cholecystectomy  2009  . Sphincterotomy  2009    lateral internal  . Colonoscopy  07/17/2011    hemorrhoids, otherwise normal including terminal ileum and random colon biopises  . Upper gastrointestinal endoscopy  07/17/2011    normal, including duodenal biopsies    Family History  Problem Relation Age of Onset  . Cirrhosis Father    Social History:  reports that he has never smoked. He has never used smokeless tobacco. He reports that he does not drink alcohol or use illicit drugs.  Allergies: No Known Allergies  Prior to  Admission medications   Medication Sig Start Date End Date Taking? Authorizing Provider  Cholecalciferol (VITAMIN D PO) Take 1 tablet by mouth daily.   Yes Historical Provider, MD  ibuprofen (ADVIL,MOTRIN) 200 MG tablet Take 400 mg by mouth every 6 (six) hours as needed for pain.   Yes Historical Provider, MD  NEXIUM 40 MG capsule TAKE ONE CAPSULE BY MOUTH EVERY DAY   Yes Wendie Agreste, MD     Results for orders placed or performed during the hospital encounter of 01/02/16 (from the past 48 hour(s))  CBC with Differential     Status: None   Collection Time: 01/02/16 10:42 AM  Result Value Ref Range   WBC 7.6 4.0 - 10.5 K/uL   RBC 5.69 4.22 - 5.81 MIL/uL   Hemoglobin 16.7 13.0 - 17.0 g/dL   HCT 47.7 39.0 - 52.0 %   MCV 83.8 78.0 - 100.0 fL   MCH 29.3 26.0 - 34.0 pg   MCHC 35.0 30.0 - 36.0 g/dL   RDW 12.8 11.5 - 15.5 %   Platelets 228 150 - 400 K/uL   Neutrophils Relative % 78 %   Neutro Abs 5.9 1.7 - 7.7 K/uL   Lymphocytes Relative 17 %   Lymphs Abs 1.3 0.7 - 4.0 K/uL   Monocytes Relative 5 %   Monocytes Absolute 0.3 0.1 - 1.0 K/uL  Eosinophils Relative 0 %   Eosinophils Absolute 0.0 0.0 - 0.7 K/uL   Basophils Relative 0 %   Basophils Absolute 0.0 0.0 - 0.1 K/uL  Comprehensive metabolic panel     Status: Abnormal   Collection Time: 01/02/16 10:42 AM  Result Value Ref Range   Sodium 135 135 - 145 mmol/L   Potassium 4.4 3.5 - 5.1 mmol/L   Chloride 97 (L) 101 - 111 mmol/L   CO2 25 22 - 32 mmol/L   Glucose, Bld 164 (H) 65 - 99 mg/dL   BUN 13 6 - 20 mg/dL   Creatinine, Ser 1.56 (H) 0.61 - 1.24 mg/dL   Calcium 11.2 (H) 8.9 - 10.3 mg/dL   Total Protein 9.3 (H) 6.5 - 8.1 g/dL   Albumin 5.4 (H) 3.5 - 5.0 g/dL   AST 31 15 - 41 U/L   ALT 43 17 - 63 U/L   Alkaline Phosphatase 76 38 - 126 U/L   Total Bilirubin 1.6 (H) 0.3 - 1.2 mg/dL   GFR calc non Af Amer 51 (L) >60 mL/min   GFR calc Af Amer 59 (L) >60 mL/min    Comment: (NOTE) The eGFR has been calculated using the CKD EPI  equation. This calculation has not been validated in all clinical situations. eGFR's persistently <60 mL/min signify possible Chronic Kidney Disease.    Anion gap 13 5 - 15  Lipase, blood     Status: None   Collection Time: 01/02/16 10:42 AM  Result Value Ref Range   Lipase 29 11 - 51 U/L   Ct Abdomen Pelvis W Contrast  01/02/2016  CLINICAL DATA:  Sudden onset diffuse abdominal pain, nausea, vomiting and diarrhea at 4:30 a.m. today. Multiple previous surgical procedures for bowel obstruction due to adhesions. Previous colectomy, cholecystectomy and gastrostomy. EXAM: CT ABDOMEN AND PELVIS WITH CONTRAST TECHNIQUE: Multidetector CT imaging of the abdomen and pelvis was performed using the standard protocol following bolus administration of intravenous contrast. CONTRAST:  54m OMNIPAQUE IOHEXOL 300 MG/ML  SOLN COMPARISON:  Chest or abdomen radiographs dated 01/23/2014. Abdomen and pelvis CT dated 05/07/2013. FINDINGS: Lower chest: Breathing motion blurring. Mild bilateral dependent atelectasis. Hepatobiliary: Diffuse low density of the liver relative to the spleen, without significant change. Cholecystectomy clips. Pancreas: No mass, inflammatory changes, or other significant abnormality. Spleen: Within normal limits in size and appearance. Adrenals/Urinary Tract: Normal appearing adrenal glands. No significant change in small bilateral renal cysts. No urinary tract calculi or hydronephrosis. Stomach/Bowel: Multiple dilated, fluid-filled small bowel loops in the mid abdomen. The proximal and distal small bowel are normal in caliber. No obstructing mass visualized. Normal caliber colon containing gas and stool. No evidence of appendicitis. No free peritoneal air or fluid. No bowel wall thickening or pneumatosis. Vascular/Lymphatic: No pathologically enlarged lymph nodes. No evidence of abdominal aortic aneurysm. Reproductive: No mass or other significant abnormality. Other: None. Musculoskeletal: Mild  lumbar and lower thoracic spine degenerative changes. IMPRESSION: 1. Closed loop small-bowel obstruction in the mid abdomen. 2. Stable diffuse hepatic steatosis. Electronically Signed   By: SClaudie ReveringM.D.   On: 01/02/2016 13:08    Review of Systems  Constitutional: Negative.   HENT: Negative.   Eyes: Negative.   Respiratory: Negative.   Cardiovascular: Negative.   Gastrointestinal: Positive for nausea, vomiting, abdominal pain and constipation (no BM for 3 days). Negative for diarrhea.  Genitourinary: Negative.   Musculoskeletal: Negative.   Skin: Negative.   Neurological: Negative.   Endo/Heme/Allergies: Negative.   Psychiatric/Behavioral: Negative.  Blood pressure 102/77, pulse 69, temperature 98.2 F (36.8 C), temperature source Oral, resp. rate 10, height _0  (1.854 m), weight 90.719 kg (200 lb), SpO2 98 %. Physical Exam  Constitutional: He is oriented to person, place, and time. He appears well-developed and well-nourished. No distress.  HENT:  Head: Normocephalic and atraumatic.  Nose: Nose normal.  Eyes: Conjunctivae and EOM are normal. Right eye exhibits no discharge. Left eye exhibits no discharge. No scleral icterus.  Neck: Normal range of motion. Neck supple. No JVD present. No tracheal deviation present. No thyromegaly present.  Cardiovascular: Normal rate, regular rhythm, normal heart sounds and intact distal pulses.   No murmur heard. Respiratory: Effort normal and breath sounds normal. No respiratory distress. He has no wheezes. He has no rales. He exhibits no tenderness.  GI: There is tenderness.  Soft, minimal distension, BS are hypoactive,  He is a little sore, but not really tender on exam. Midline surgical scar and gastrostomy scar, gastrotomy tube is out and site is healed.  Musculoskeletal: He exhibits no edema.  Lymphadenopathy:    He has no cervical adenopathy.  Neurological: He is alert and oriented to person, place, and time. He displays normal  reflexes. No cranial nerve deficit. Coordination normal.  Skin: Skin is warm and dry. No rash noted. He is not diaphoretic. No erythema. No pallor.  Psychiatric: He has a normal mood and affect. His behavior is normal. Judgment and thought content normal.     Assessment/Plan Recurrent SBO S/P multiple abdominal surgeries with recurrent SBO GERD Dehydration  Acute renal insuffiencey with elevated creatinine   Plan:  Admit, he is getting 2nd liter of NS now.  Hydrate, bowel rest, decompression with NG tube.  Recheck film and labs in AM and see how he does.  If not improved we may so SB protocol tomorrow.     Chamya Hunton 01/02/2016, 2:47 PM

## 2016-01-02 NOTE — ED Notes (Signed)
General surgery at bedside. 

## 2016-01-02 NOTE — ED Notes (Signed)
Pt given urinal for specimen.  

## 2016-01-02 NOTE — ED Notes (Signed)
Attempted report 

## 2016-01-02 NOTE — Progress Notes (Signed)
Attempted to insert NGT tube X2 but patient unable to tolerate procedure. Will notify MD.

## 2016-01-02 NOTE — ED Provider Notes (Signed)
CSN: [id number]     Arrival date & time 01/02/16  8295 History   First MD Initiated Contact with Patient 01/02/16 [phone]     Chief Complaint  Patient presents with  . Emesis  . Weakness     (Consider location/radiation/quality/duration/timing/severity/associated sxs/prior Treatment) HPI  [name] is a 50 y.o M with a pmhx of multiple small bowel obstructions due to adhesions who presents to the emergency department today complaining of nausea and vomiting. Patient states that around 4:30 AM he began experiencing intractable nausea and vomiting. Emesis is nonbloody and nonbilious. Patient has been unable to tolerate anything by mouth since this time. Patient's last bowel movement was 3 days ago. Patient states he did pass gas yesterday. Patient has moderate diffuse abdominal pain Past Medical History  Diagnosis Date  . Postural lightheadedness   . Chest wall pain   . Obstructive sleep apnea     apnealink 10/23/10 AHI 5, Sp02 low 87%  . Headache(784.0)   . Irritable bowel syndrome   . GERD (gastroesophageal reflux disease)   . Small bowel obstruction ([facility])   . Diarrhea recurrent  . Cough    Past Surgical History  Procedure Laterality Date  . Colectomy  1994    segmental  . Laparotomy  2002, 2005, 2009  . Gastrostomy  2005    stamm  . Cholecystectomy  2009  . Sphincterotomy  2009    lateral internal  . Colonoscopy  07/17/2011    hemorrhoids, otherwise normal including terminal ileum and random colon biopises  . Upper gastrointestinal endoscopy  07/17/2011    normal, including duodenal biopsies   Family History  Problem Relation Age of Onset  . Cirrhosis Father    Social History  Substance Use Topics  . Smoking status: Never Smoker   . Smokeless tobacco: Never Used  . Alcohol Use: No    Review of Systems  All other systems reviewed and are negative.     Allergies  Review of patient's allergies indicates no known allergies.  Home Medications   Prior to  Admission medications   Medication Sig Start Date End Date Taking? Authorizing Provider  Cholecalciferol (VITAMIN D PO) Take 1 tablet by mouth daily.   Yes Historical Provider, MD  ibuprofen (ADVIL,MOTRIN) 200 MG tablet Take 400 mg by mouth every 6 (six) hours as needed for pain.   Yes Historical Provider, MD  NEXIUM 40 MG capsule TAKE ONE CAPSULE BY MOUTH EVERY DAY   Yes Shade Flood, MD   BP 115/76 mmHg  Pulse 95  Temp(Src) 98.2 F (36.8 C) (Oral)  Resp 18  Ht  (1.854 m)  Wt 90.719 kg  BMI 26.39 kg/m2  SpO2 96% Physical Exam  Constitutional: He is oriented to person, place, and time. He appears well-developed and well-nourished. No distress.  HENT:  Head: Normocephalic and atraumatic.  Mouth/Throat: No oropharyngeal exudate.  Eyes: Conjunctivae and EOM are normal. Pupils are equal, round, and reactive to light. Right eye exhibits no discharge. Left eye exhibits no discharge. No scleral icterus.  Cardiovascular: Normal rate, regular rhythm, normal heart sounds and intact distal pulses.  Exam reveals no gallop and no friction rub.   No murmur heard. Pulmonary/Chest: Effort normal and breath sounds normal. No respiratory distress. He has no wheezes. He has no rales. He exhibits no tenderness.  Abdominal: Soft. Bowel sounds are normal. He exhibits distension ( mild distention). He exhibits no mass. There is tenderness (very mild diffuse abdominal tenderness, non focal ). There  is no rebound and no guarding.  No peritoneal signs.  Musculoskeletal: Normal range of motion. He exhibits no edema.  Neurological: He is alert and oriented to person, place, and time.  Skin: Skin is warm and dry. No rash noted. He is not diaphoretic. No erythema. No pallor.  Psychiatric: He has a normal mood and affect. His behavior is normal.  Nursing note and vitals reviewed.   ED Course  Procedures (including critical care time) Labs Review Labs Reviewed  COMPREHENSIVE METABOLIC PANEL - Abnormal;  Notable for the following:    Chloride 97 (*)    Glucose, Bld 164 (*)    Creatinine, Ser 1.56 (*)    Calcium 11.2 (*)    Total Protein 9.3 (*)    Albumin 5.4 (*)    Total Bilirubin 1.6 (*)    GFR calc non Af Amer 51 (*)    GFR calc Af Amer 59 (*)    All other components within normal limits  URINALYSIS, ROUTINE W REFLEX MICROSCOPIC (NOT AT [facility]) - Abnormal; Notable for the following:    Specific Gravity, Urine >1.046 (*)    Hgb urine dipstick TRACE (*)    Ketones, ur 15 (*)    Protein, ur 30 (*)    All other components within normal limits  URINE MICROSCOPIC-ADD ON - Abnormal; Notable for the following:    Squamous Epithelial / LPF 0-5 (*)    Bacteria, UA FEW (*)    Casts HYALINE CASTS (*)    Crystals CA OXALATE CRYSTALS (*)    All other components within normal limits  CBC WITH DIFFERENTIAL/PLATELET  LIPASE, BLOOD    Imaging Review Ct Abdomen Pelvis W Contrast  01/02/2016  CLINICAL DATA:  Sudden onset diffuse abdominal pain, nausea, vomiting and diarrhea at 4:30 a.m. today. Multiple previous surgical procedures for bowel obstruction due to adhesions. Previous colectomy, cholecystectomy and gastrostomy. EXAM: CT ABDOMEN AND PELVIS WITH CONTRAST TECHNIQUE: Multidetector CT imaging of the abdomen and pelvis was performed using the standard protocol following bolus administration of intravenous contrast. CONTRAST:  75mL OMNIPAQUE IOHEXOL 300 MG/ML  SOLN COMPARISON:  Chest or abdomen radiographs dated 01/23/2014. Abdomen and pelvis CT dated 05/07/2013. FINDINGS: Lower chest: Breathing motion blurring. Mild bilateral dependent atelectasis. Hepatobiliary: Diffuse low density of the liver relative to the spleen, without significant change. Cholecystectomy clips. Pancreas: No mass, inflammatory changes, or other significant abnormality. Spleen: Within normal limits in size and appearance. Adrenals/Urinary Tract: Normal appearing adrenal glands. No significant change in small bilateral renal  cysts. No urinary tract calculi or hydronephrosis. Stomach/Bowel: Multiple dilated, fluid-filled small bowel loops in the mid abdomen. The proximal and distal small bowel are normal in caliber. No obstructing mass visualized. Normal caliber colon containing gas and stool. No evidence of appendicitis. No free peritoneal air or fluid. No bowel wall thickening or pneumatosis. Vascular/Lymphatic: No pathologically enlarged lymph nodes. No evidence of abdominal aortic aneurysm. Reproductive: No mass or other significant abnormality. Other: None. Musculoskeletal: Mild lumbar and lower thoracic spine degenerative changes. IMPRESSION: 1. Closed loop small-bowel obstruction in the mid abdomen. 2. Stable diffuse hepatic steatosis. Electronically Signed   By: Beckie Salts M.D.   On: 01/02/2016 13:08   I have personally reviewed and evaluated these images and lab results as part of my medical decision-making.   EKG Interpretation None      MDM   Final diagnoses:  SBO (small bowel obstruction) ([facility])    50 year old male with past medical history of multiple small bowel obstructions due  to adhesions presents to the ED today with intractable vomiting since 4:30 this morning. Last bowel movement was 3 days ago. Patient has been unable to tolerate anything by mouth due to vomiting. No associated diarrhea, melena, hematochezia. No fevers. In ED, patient is nontoxic, nonseptic appearing patient's abdomen is mildly tender, nonfocal. Mild distention present. Given patient's significant past medical history for small bowel distraction and lack of bowel movement, concern for SBO. Patient given 1 L IV fluids, Zofran, morphine, Protonix. We'll collect lab work and order CT abdomen to rule out SBO. Patient made NPO.   CT reveals closed loop small bowel instruction in the mid abdomen. We will consult Gen. surgery for this.   Spoke with general surgery who recommends placing NG tube and administering another liter of IV  fluids. They will consult patient in ED and admit to their service.  Patient was discussed with and seen by Dr. Rubin Payor who agrees with the treatment plan.          Lester Kinsman Emerson, PA-C 01/02/16 1646  Benjiman Core, MD 01/03/16 1534

## 2016-01-02 NOTE — ED Notes (Signed)
Pt states vomiting x 5 since 430 AM. Denies diarrhea. C/o weakness.

## 2016-01-03 ENCOUNTER — Inpatient Hospital Stay (HOSPITAL_COMMUNITY): Payer: BLUE CROSS/BLUE SHIELD

## 2016-01-03 LAB — BASIC METABOLIC PANEL
Anion gap: 9 (ref 5–15)
BUN: 13 mg/dL (ref 6–20)
CO2: 25 mmol/L (ref 22–32)
Calcium: 9 mg/dL (ref 8.9–10.3)
Chloride: 106 mmol/L (ref 101–111)
Creatinine, Ser: 0.96 mg/dL (ref 0.61–1.24)
GFR calc Af Amer: >60 mL/min (ref >60)
GLUCOSE: 72 mg/dL (ref 65–99)
POTASSIUM: 4.1 mmol/L (ref 3.5–5.1)
Sodium: 140 mmol/L (ref 135–145)

## 2016-01-03 LAB — CBC
HEMATOCRIT: 42.8 % (ref 39.0–52.0)
Hemoglobin: 14.4 g/dL (ref 13.0–17.0)
MCH: 29.2 pg (ref 26.0–34.0)
MCHC: 33.6 g/dL (ref 30.0–36.0)
MCV: 86.8 fL (ref 78.0–100.0)
Platelets: 200 10*3/uL (ref 150–400)
RBC: 4.93 MIL/uL (ref 4.22–5.81)
RDW: 13.2 % (ref 11.5–15.5)
WBC: 6.2 10*3/uL (ref 4.0–10.5)

## 2016-01-03 LAB — MAGNESIUM: Magnesium: 1.8 mg/dL (ref 1.7–2.4)

## 2016-01-03 NOTE — Progress Notes (Signed)
Pt reports 2 more BM's and refusing NG.  Will recheck film in AM.  Creatinine and WBC are normal after hydration.

## 2016-01-03 NOTE — Care Management Note (Signed)
Case Management Note  Patient Details  Name: [name] MRN: [id number] Date of Birth: Aug 28, 1966  Subjective/Objective:                    Action/Plan:  Initial UR completed. Expected Discharge Date:                  Expected Discharge Plan:  Home/Self Care  In-House Referral:     Discharge planning Services     Post Acute Care Choice:    Choice offered to:     DME Arranged:    DME Agency:     HH Arranged:    HH Agency:     Status of Service:  In process, will continue to follow  Medicare Important Message Given:    Date Medicare IM Given:    Medicare IM give by:    Date Additional Medicare IM Given:    Additional Medicare Important Message give by:     If discussed at Long Length of Stay Meetings, dates discussed:    Additional Comments:  Kingsley Plan, RN 01/03/2016, 8:13 AM

## 2016-01-03 NOTE — Progress Notes (Signed)
Subjective: He had liquid stool last PM. Unfortunately they were unable to get the NG in, and he got a bloody nose in the process.  He is soft, and not distended to me, he thinks he is still a little distended.    Objective: Vital signs in last 24 hours: Temp:  [97.9 F (36.6 C)-98.5 F (36.9 C)] 98.4 F (36.9 C) (01/26 0644) Pulse Rate:  [61-95] 73 (01/26 0644) Resp:  [10-18] 18 (01/26 0644) BP: (101-121)/(71-84) 112/75 mmHg (01/26 0644) SpO2:  [93 %-100 %] 100 % (01/26 0644) Weight:  [88.6 kg (195 lb 5.2 oz)-90.719 kg (200 lb)] 88.6 kg (195 lb 5.2 oz) (01/25 1641) Last BM Date: 01/03/16 NG was not able to be placed,  Voided x 2 and BM x 2 recorded Afebrile, VSS No labs up yet, but ordered Abd films pending also Intake/Output from previous day: 01/25 0701 - 01/26 0700 In: 1725 [I.V.:1725] Out: 4 [Urine:2; Stool:2] Intake/Output this shift:    General appearance: alert, cooperative and no distress Resp: clear to auscultation bilaterally GI: soft, few BS, not tender, no distension I can see, he thinks he is still a little distended.  Lab Results:   Recent Labs  01/02/16 1042  WBC 7.6  HGB 16.7  HCT 47.7  PLT 228    BMET  Recent Labs  01/02/16 1042  NA 135  K 4.4  CL 97*  CO2 25  GLUCOSE 164*  BUN 13  CREATININE 1.56*  CALCIUM 11.2*   PT/INR No results for input(s): LABPROT, INR in the last 72 hours.   Recent Labs Lab 01/02/16 1042  AST 31  ALT 43  ALKPHOS 76  BILITOT 1.6*  PROT 9.3*  ALBUMIN 5.4*     Lipase     Component Value Date/Time   LIPASE 29 01/02/2016 1042     Studies/Results: Ct Abdomen Pelvis W Contrast  01/02/2016  CLINICAL DATA:  Sudden onset diffuse abdominal pain, nausea, vomiting and diarrhea at 4:30 a.m. today. Multiple previous surgical procedures for bowel obstruction due to adhesions. Previous colectomy, cholecystectomy and gastrostomy. EXAM: CT ABDOMEN AND PELVIS WITH CONTRAST TECHNIQUE: Multidetector CT imaging of  the abdomen and pelvis was performed using the standard protocol following bolus administration of intravenous contrast. CONTRAST:  75mL OMNIPAQUE IOHEXOL 300 MG/ML  SOLN COMPARISON:  Chest or abdomen radiographs dated 01/23/2014. Abdomen and pelvis CT dated 05/07/2013. FINDINGS: Lower chest: Breathing motion blurring. Mild bilateral dependent atelectasis. Hepatobiliary: Diffuse low density of the liver relative to the spleen, without significant change. Cholecystectomy clips. Pancreas: No mass, inflammatory changes, or other significant abnormality. Spleen: Within normal limits in size and appearance. Adrenals/Urinary Tract: Normal appearing adrenal glands. No significant change in small bilateral renal cysts. No urinary tract calculi or hydronephrosis. Stomach/Bowel: Multiple dilated, fluid-filled small bowel loops in the mid abdomen. The proximal and distal small bowel are normal in caliber. No obstructing mass visualized. Normal caliber colon containing gas and stool. No evidence of appendicitis. No free peritoneal air or fluid. No bowel wall thickening or pneumatosis. Vascular/Lymphatic: No pathologically enlarged lymph nodes. No evidence of abdominal aortic aneurysm. Reproductive: No mass or other significant abnormality. Other: None. Musculoskeletal: Mild lumbar and lower thoracic spine degenerative changes. IMPRESSION: 1. Closed loop small-bowel obstruction in the mid abdomen. 2. Stable diffuse hepatic steatosis. Electronically Signed   By: Beckie Salts M.D.   On: 01/02/2016 13:08    Medications: . heparin  5,000 Units Subcutaneous 3 times per day    Assessment/Plan Recurrent SBO S/P multiple  abdominal surgeries with recurrent SBO GERD Dehydration  Acute renal insuffiencey with elevated creatinine  Antibiotics:  None DVT:  Heparin/SCD  Plan:  He has films and labs pending.  There is an order for IR to place the NG, but I will wait till we get films and see how he does.  Keeping NPO for  now.       LOS: 1 day    Hillman Attig 01/03/2016

## 2016-01-03 NOTE — Progress Notes (Signed)
Transporter arrived to take patient to IR for NGT placement.  He states that he has had 2 more bowel movements, feels "rumbling" in his stomach, and does not want to have the tube placed.  Educated patient regarding importance of NGT to help resolve his condition.  He still refuses. Will, PA was made aware.

## 2016-01-04 ENCOUNTER — Inpatient Hospital Stay (HOSPITAL_COMMUNITY): Payer: BLUE CROSS/BLUE SHIELD

## 2016-01-04 MED ORDER — PANTOPRAZOLE SODIUM 40 MG PO TBEC
40.0000 mg | DELAYED_RELEASE_TABLET | Freq: Every day | ORAL | Status: DC
  Administered 2016-01-04 – 2016-01-06 (×3): 40 mg via ORAL
  Filled 2016-01-04 (×3): qty 1

## 2016-01-04 NOTE — Progress Notes (Signed)
Patient ID: [name], male   DOB: 1966/09/18, 50 y.o.   MRN: [id number]     [facility]      [address] [place], [place] [address]    Phone: [phone] FAX: [phone]     Subjective: BMs yesterday and passing flatus. No vomiting.  Endorses nausea.  AXR shows persistent SBO Objective:  Vital signs:  Filed Vitals:   01/03/16 0644 01/03/16 1325 01/03/16 2145 01/04/16 0551  BP: 112/75 110/73 122/78 126/74  Pulse: 73 72 84 85  Temp: 98.4 F (36.9 C) 98.4 F (36.9 C) 98.4 F (36.9 C) 98.8 F (37.1 C)  TempSrc: Oral Oral Oral Oral  Resp: _0 Height:      Weight:      SpO2: 100% 97% 98% 97%    Last BM Date: 01/03/16  Intake/Output   Yesterday:  01/26 0701 - 01/27 0700 In: 165 [I.V.:875] Out: 1750 [Urine:1750] This shift:     Physical Exam: General: Pt awake/alert/oriented x4 in no acute distress Chest: cta.  No chest wall pain w good excursion CV:  Pulses intact.  Regular rhythm Abdomen: Soft.  Nondistended. Non tender.  No evidence of peritonitis.  No incarcerated hernias. Ext:  SCDs BLE.  No mjr edema.  No cyanosis Skin: No petechiae / purpura   Problem List:   Active Problems:   SBO (small bowel obstruction) ([place])    Results:   Labs: Results for orders placed or performed during the hospital encounter of 01/02/16 (from the past 48 hour(s))  CBC with Differential     Status: None   Collection Time: 01/02/16 10:42 AM  Result Value Ref Range   WBC 7.6 4.0 - 10.5 K/uL   RBC 5.69 4.22 - 5.81 MIL/uL   Hemoglobin 16.7 13.0 - 17.0 g/dL   HCT 47.7 39.0 - 52.0 %   MCV 83.8 78.0 - 100.0 fL   MCH 29.3 26.0 - 34.0 pg   MCHC 35.0 30.0 - 36.0 g/dL   RDW 12.8 11.5 - 15.5 %   Platelets 228 150 - 400 K/uL   Neutrophils Relative % 78 %   Neutro Abs 5.9 1.7 - 7.7 K/uL   Lymphocytes Relative 17 %   Lymphs Abs 1.3 0.7 - 4.0 K/uL   Monocytes Relative 5 %   Monocytes Absolute 0.3 0.1 - 1.0 K/uL    Eosinophils Relative 0 %   Eosinophils Absolute 0.0 0.0 - 0.7 K/uL   Basophils Relative 0 %   Basophils Absolute 0.0 0.0 - 0.1 K/uL  Comprehensive metabolic panel     Status: Abnormal   Collection Time: 01/02/16 10:42 AM  Result Value Ref Range   Sodium 135 135 - 145 mmol/L   Potassium 4.4 3.5 - 5.1 mmol/L   Chloride 97 (L) 101 - 111 mmol/L   CO2 25 22 - 32 mmol/L   Glucose, Bld 164 (H) 65 - 99 mg/dL   BUN 13 6 - 20 mg/dL   Creatinine, Ser 1.56 (H) 0.61 - 1.24 mg/dL   Calcium 11.2 (H) 8.9 - 10.3 mg/dL   Total Protein 9.3 (H) 6.5 - 8.1 g/dL   Albumin 5.4 (H) 3.5 - 5.0 g/dL   AST 31 15 - 41 U/L   ALT 43 17 - 63 U/L   Alkaline Phosphatase 76 38 - 126 U/L   Total Bilirubin 1.6 (H) 0.3 - 1.2 mg/dL   GFR calc non Af Amer 51 (L) >60 mL/min  GFR calc Af Amer 59 (L) >60 mL/min    Comment: (NOTE) The eGFR has been calculated using the CKD EPI equation. This calculation has not been validated in all clinical situations. eGFR's persistently <60 mL/min signify possible Chronic Kidney Disease.    Anion gap 13 5 - 15  Lipase, blood     Status: None   Collection Time: 01/02/16 10:42 AM  Result Value Ref Range   Lipase 29 11 - 51 U/L  Urinalysis, Routine w reflex microscopic (not at [facility])     Status: Abnormal   Collection Time: 01/02/16  2:59 PM  Result Value Ref Range   Color, Urine YELLOW YELLOW   APPearance CLEAR CLEAR   Specific Gravity, Urine >1.046 (H) 1.005 - 1.030   pH 7.5 5.0 - 8.0   Glucose, UA NEGATIVE NEGATIVE mg/dL   Hgb urine dipstick TRACE (A) NEGATIVE   Bilirubin Urine NEGATIVE NEGATIVE   Ketones, ur 15 (A) NEGATIVE mg/dL   Protein, ur 30 (A) NEGATIVE mg/dL   Nitrite NEGATIVE NEGATIVE   Leukocytes, UA NEGATIVE NEGATIVE  Urine microscopic-add on     Status: Abnormal   Collection Time: 01/02/16  2:59 PM  Result Value Ref Range   Squamous Epithelial / LPF 0-5 (A) NONE SEEN   WBC, UA 0-5 0 - 5 WBC/hpf   RBC / HPF 0-5 0 - 5 RBC/hpf   Bacteria, UA FEW (A) NONE SEEN    Casts HYALINE CASTS (A) NEGATIVE   Crystals CA OXALATE CRYSTALS (A) NEGATIVE  Basic metabolic panel     Status: None   Collection Time: 01/03/16  8:30 AM  Result Value Ref Range   Sodium 140 135 - 145 mmol/L   Potassium 4.1 3.5 - 5.1 mmol/L   Chloride 106 101 - 111 mmol/L   CO2 25 22 - 32 mmol/L   Glucose, Bld 72 65 - 99 mg/dL   BUN 13 6 - 20 mg/dL   Creatinine, Ser 0.96 0.61 - 1.24 mg/dL   Calcium 9.0 8.9 - 10.3 mg/dL    Comment: DELTA CHECK NOTED   GFR calc non Af Amer >60 >60 mL/min   GFR calc Af Amer >60 >60 mL/min    Comment: (NOTE) The eGFR has been calculated using the CKD EPI equation. This calculation has not been validated in all clinical situations. eGFR's persistently <60 mL/min signify possible Chronic Kidney Disease.    Anion gap 9 5 - 15  CBC     Status: None   Collection Time: 01/03/16  8:30 AM  Result Value Ref Range   WBC 6.2 4.0 - 10.5 K/uL   RBC 4.93 4.22 - 5.81 MIL/uL   Hemoglobin 14.4 13.0 - 17.0 g/dL   HCT 42.8 39.0 - 52.0 %   MCV 86.8 78.0 - 100.0 fL   MCH 29.2 26.0 - 34.0 pg   MCHC 33.6 30.0 - 36.0 g/dL   RDW 13.2 11.5 - 15.5 %   Platelets 200 150 - 400 K/uL  Magnesium     Status: None   Collection Time: 01/03/16  8:30 AM  Result Value Ref Range   Magnesium 1.8 1.7 - 2.4 mg/dL    Imaging / Studies: Ct Abdomen Pelvis W Contrast  01/02/2016  CLINICAL DATA:  Sudden onset diffuse abdominal pain, nausea, vomiting and diarrhea at 4:30 a.m. today. Multiple previous surgical procedures for bowel obstruction due to adhesions. Previous colectomy, cholecystectomy and gastrostomy. EXAM: CT ABDOMEN AND PELVIS WITH CONTRAST TECHNIQUE: Multidetector CT imaging of the abdomen and  pelvis was performed using the standard protocol following bolus administration of intravenous contrast. CONTRAST:  51m OMNIPAQUE IOHEXOL 300 MG/ML  SOLN COMPARISON:  Chest or abdomen radiographs dated 01/23/2014. Abdomen and pelvis CT dated 05/07/2013. FINDINGS: Lower chest: Breathing  motion blurring. Mild bilateral dependent atelectasis. Hepatobiliary: Diffuse low density of the liver relative to the spleen, without significant change. Cholecystectomy clips. Pancreas: No mass, inflammatory changes, or other significant abnormality. Spleen: Within normal limits in size and appearance. Adrenals/Urinary Tract: Normal appearing adrenal glands. No significant change in small bilateral renal cysts. No urinary tract calculi or hydronephrosis. Stomach/Bowel: Multiple dilated, fluid-filled small bowel loops in the mid abdomen. The proximal and distal small bowel are normal in caliber. No obstructing mass visualized. Normal caliber colon containing gas and stool. No evidence of appendicitis. No free peritoneal air or fluid. No bowel wall thickening or pneumatosis. Vascular/Lymphatic: No pathologically enlarged lymph nodes. No evidence of abdominal aortic aneurysm. Reproductive: No mass or other significant abnormality. Other: None. Musculoskeletal: Mild lumbar and lower thoracic spine degenerative changes. IMPRESSION: 1. Closed loop small-bowel obstruction in the mid abdomen. 2. Stable diffuse hepatic steatosis. Electronically Signed   By: SClaudie ReveringM.D.   On: 01/02/2016 13:08   Dg Abd 2 Views  01/04/2016  ADDENDUM REPORT: 01/04/2016 08:15 ADDENDUM: Corrected report: IMPRESSION: Persistent small bowel obstruction. Electronically Signed   By: ENolon NationsM.D.   On: 01/04/2016 08:15  01/04/2016  CLINICAL DATA:  BOWEL OBSTRUCTION.ABDOMENAL  PAIN EXAM: ABDOMEN - 2 VIEW COMPARISON:  CT abdomen and pelvis 01/02/2016 FINDINGS: Persistent dilatation of small bowel loops identified in the central abdomen. There are associated air-fluid levels on the erect view. Gas identified in nondilated loops of large bowel. No evidence for free intraperitoneal air beneath the diaphragm. Surgical clips are noted in the right upper quadrant the abdomen. Next item persistent partial small bowel obstruction.  IMPRESSION: Negative. Electronically Signed: By: ENolon NationsM.D. On: 01/03/2016 10:47   Dg Abd 2 Views  01/04/2016  CLINICAL DATA:  Small-bowel obstruction.  Abdominal discomfort . EXAM: ABDOMEN - 2 VIEW COMPARISON:  01/03/2016.  CT 01/02/2016 FINDINGS: Soft tissue structures are unremarkable. Dilated loops of small bowel with a paucity of intra colonic gas again noted. Findings consistent with persistent small-bowel obstruction. No interim improvement. No free air. Pelvic calcifications consistent phleboliths . Surgical clips right upper quadrant. No acute bony abnormality IMPRESSION: Persistent small-bowel obstruction without interim improvement. Electronically Signed   By: TMarcello Moores Register   On: 01/04/2016 08:13    Medications / Allergies:  Scheduled Meds: . heparin  5,000 Units Subcutaneous 3 times per day   Continuous Infusions: . 0.9 % NaCl with KCl 20 mEq / L 125 mL/hr at 01/03/16 1702   PRN Meds:.diphenhydrAMINE **OR** diphenhydrAMINE, morphine injection, ondansetron **OR** ondansetron (ZOFRAN) IV  Antibiotics: Anti-infectives    None        Assessment/Plan Recurrent SBO-having BM/flatus, abdomen is soft and non tender.  AXR did not show any improvement, but imaging could certainly lag.  Will consider starting clears.  AKI-resolved with IVF VTE prophylaxis-SCD/heparin FEN-IVF   EErby Pian [facility] Pager [phone] For consults and floor pages call [phone](7A-4:30P)  01/04/2016 8:26 AM

## 2016-01-05 ENCOUNTER — Inpatient Hospital Stay (HOSPITAL_COMMUNITY): Payer: BLUE CROSS/BLUE SHIELD

## 2016-01-05 MED ORDER — KETOROLAC TROMETHAMINE 30 MG/ML IJ SOLN
30.0000 mg | Freq: Four times a day (QID) | INTRAMUSCULAR | Status: DC | PRN
  Filled 2016-01-05: qty 1

## 2016-01-05 NOTE — Progress Notes (Addendum)
Subjective: Alert.  No distress.  Says he's had four loose stools.  Minimal pain.  Still has some nausea but no vomiting. Abdominal x-rays look better but he still has a loop of small bowel present which is a little abnormal. No labs today  Objective: Vital signs in last 24 hours: Temp:  [98.5 F (36.9 C)-98.7 F (37.1 C)] 98.7 F (37.1 C) 01/30/2023 0606) Pulse Rate:  [68-82] 82 01/30/23 0606) Resp:  [18] 18 January 30, 2023 0606) BP: (115-116)/(75-79) 116/79 mmHg 01-30-2023 0606) SpO2:  [98 %-100 %] 100 % 01/30/2023 0606) Last BM Date: 01/03/16  Intake/Output from previous day: 01/27 0701 - 2023-01-30 0700 In: 4790 [P.O.:540; I.V.:4250] Out: 1300 [Urine:1300] Intake/Output this shift:    General appearance: Alert.  No distress whatsoever.  Cooperative Resp: clear to auscultation bilaterally GI: Abdomen soft.  Flat.  Nontender.  Midline incision intact.  No hernias.  Normal bowel sounds.  Lab Results:   Recent Labs  01/02/16 1042 01/03/16 0830  WBC 7.6 6.2  HGB 16.7 14.4  HCT 47.7 42.8  PLT 228 200   BMET  Recent Labs  01/02/16 1042 01/03/16 0830  NA 135 140  K 4.4 4.1  CL 97* 106  CO2 25 25  GLUCOSE 164* 72  BUN 13 13  CREATININE 1.56* 0.96  CALCIUM 11.2* 9.0   PT/INR No results for input(s): LABPROT, INR in the last 72 hours. ABG No results for input(s): PHART, HCO3 in the last 72 hours.  Invalid input(s): PCO2, PO2  Studies/Results: Dg Abd 2 Views  01/31/16  CLINICAL DATA:  Small-bowel obstruction EXAM: ABDOMEN - 2 VIEW COMPARISON:  01/04/2016 FINDINGS: Scattered large and small bowel gas is identified. Persistent mildly dilated small bowel is noted in the mid abdomen. The overall appearance has improved slightly in the interval. No definitive free air is seen. No acute bony abnormality is seen. IMPRESSION: Persistent small bowel obstruction although appears slightly improved when compared with the prior exam. Electronically Signed   By: Alcide Clever M.D.   On: 01-31-2016  07:43   Dg Abd 2 Views  01/04/2016  ADDENDUM REPORT: 01/04/2016 08:15 ADDENDUM: Corrected report: IMPRESSION: Persistent small bowel obstruction. Electronically Signed   By: Norva Pavlov M.D.   On: 01/04/2016 08:15  01/04/2016  CLINICAL DATA:  BOWEL OBSTRUCTION.ABDOMENAL  PAIN EXAM: ABDOMEN - 2 VIEW COMPARISON:  CT abdomen and pelvis 01/02/2016 FINDINGS: Persistent dilatation of small bowel loops identified in the central abdomen. There are associated air-fluid levels on the erect view. Gas identified in nondilated loops of large bowel. No evidence for free intraperitoneal air beneath the diaphragm. Surgical clips are noted in the right upper quadrant the abdomen. Next item persistent partial small bowel obstruction. IMPRESSION: Negative. Electronically Signed: By: Norva Pavlov M.D. On: 01/03/2016 10:47   Dg Abd 2 Views  01/04/2016  CLINICAL DATA:  Small-bowel obstruction.  Abdominal discomfort . EXAM: ABDOMEN - 2 VIEW COMPARISON:  01/03/2016.  CT 01/02/2016 FINDINGS: Soft tissue structures are unremarkable. Dilated loops of small bowel with a paucity of intra colonic gas again noted. Findings consistent with persistent small-bowel obstruction. No interim improvement. No free air. Pelvic calcifications consistent phleboliths . Surgical clips right upper quadrant. No acute bony abnormality IMPRESSION: Persistent small-bowel obstruction without interim improvement. Electronically Signed   By: Maisie Fus  Register   On: 01/04/2016 08:13    Anti-infectives: Anti-infectives    None      Assessment/Plan:  Recurrent partial SBO.  Resolving clinically.  Abdominal x-ray a little improved but not  normal.  Advance to full liquid diet.  Discontinue narcotic and substitute Toradol. AKI-resolved with IVF VTE prophylaxis-SCD/heparin FEN-IVF   LOS: 3 days    Genifer Lazenby M 01/05/2016

## 2016-01-06 MED ORDER — ONDANSETRON 4 MG PO TBDP: 4.0000 mg | ORAL_TABLET | Freq: Four times a day (QID) | ORAL | Status: DC | PRN

## 2016-01-06 NOTE — Discharge Instructions (Signed)
See above

## 2016-01-06 NOTE — Progress Notes (Signed)
Discharge instructions gone over with patient. Home medications gone over. Prescription electronically sent.Patietn will follow up with [facility] if any future problems. Diet, activity, and reasons to call the doctor discussed. Patient verbalized understanding of instructions.

## 2016-01-06 NOTE — Discharge Summary (Signed)
Patient ID: [name] [id number] 50 y.o. [birth year]  Admit date: 01/02/2016  Discharge date and time: 01/06/2016  Admitting Physician:   Discharge Physician: Ernestene Mention  Admission Diagnoses: SBO (small bowel obstruction) ([facility]) [K56.69]  Discharge Diagnoses: SBO-resolved                                        Acute renal insufficiency with elevated creatinine, resolved                                        GERD                                         History multiple abdominal surgeries with recurrent SBO  Operations: none  Admission Condition: fair  Discharged Condition: good  Indication for Admission: Pt with prior hx of PSBO, last time was 05/07/13. He has a hx of partial colectomy [place] 1994. Prior SBO 10/2001, 05/2004, lap cholecystectomy with lysis of adhesions Stamm gastrostomy tube, Dr. Johna Sheriff, 02/2008. He now presents with Vomiting since 0430 day of admission. , pain and last BM about 3 days ago. Work up shows he is afebrile, and VSS. Labs: creatinine and calcium are up, glucose is up. WBC is 7.6, H/H up 16.7 and 47.7. CT scan shows: Multiple dilated, fluid-filled small bowel loops in the mid abdomen. The proximal and distal small bowel are normal in caliber. No obstructing mass visualized. Normal caliber colon containing gas and stool. No evidence of appendicitis. No free peritoneal air or fluid. No bowel wall thickening or pneumatosis. Stable diffuse hepatic steatosis.  Hospital Course:  The patient was admitted.  IV hydration and bowel rest was instituted.  NG tube was ordered but the nursing staff was unable to get that down and the patient refused further attempts.  Over the next 3 days his small bowel obstruction clinically and radiographically improved.  He had no nausea or vomiting thereafter.  On the day of discharge his abdomen was soft and nontender, nondistended.  There is no hernia or mass.  He stated that he felt much better and wanted to go home.   We will give him a soft diet for breakfast and then let him go home.  No prescriptions.  Follow-up with [facility] if necessary.  Return to full activities in one week.  Consults: None  Significant Diagnostic Studies: radiology: Abdominal x-rays and CT scan.  Treatments: IV hydration  Disposition: Home  Patient Instructions:    Medication List    TAKE these medications        ibuprofen 200 MG tablet  Commonly known as:  ADVIL,MOTRIN  Take 400 mg by mouth every 6 (six) hours as needed for pain.     NEXIUM 40 MG capsule  Generic drug:  esomeprazole  TAKE ONE CAPSULE BY MOUTH EVERY DAY     VITAMIN D PO  Take 1 tablet by mouth daily.        Activity: activity as tolerated Diet: Low-fat, soft diet, low fiber Wound Care: none needed  Follow-up:  With [facility]  in as needed .  Signed: Angelia Mould. Derrell Lolling, M.D., FACS General and minimally invasive surgery Breast and Colorectal Surgery  01/06/2016, 8:14 AM

## 2016-01-25 DIAGNOSIS — N539 Unspecified male sexual dysfunction: Secondary | ICD-10-CM | POA: Insufficient documentation

## 2016-01-25 DIAGNOSIS — G5711 Meralgia paresthetica, right lower limb: Secondary | ICD-10-CM | POA: Insufficient documentation

## 2016-01-25 DIAGNOSIS — R7301 Impaired fasting glucose: Secondary | ICD-10-CM | POA: Insufficient documentation

## 2016-01-25 DIAGNOSIS — E559 Vitamin D deficiency, unspecified: Secondary | ICD-10-CM | POA: Insufficient documentation

## 2016-04-27 DIAGNOSIS — J029 Acute pharyngitis, unspecified: Secondary | ICD-10-CM | POA: Diagnosis not present

## 2016-04-27 DIAGNOSIS — Z20818 Contact with and (suspected) exposure to other bacterial communicable diseases: Secondary | ICD-10-CM | POA: Diagnosis not present

## 2016-08-05 ENCOUNTER — Inpatient Hospital Stay (HOSPITAL_COMMUNITY)
Admission: EM | Admit: 2016-08-05 | Discharge: 2016-08-09 | DRG: 390 | Disposition: A | Discharge status: Home / Self Care | Payer: BLUE CROSS/BLUE SHIELD | Attending: Surgery | Admitting: Surgery

## 2016-08-05 ENCOUNTER — Encounter (HOSPITAL_COMMUNITY): Payer: Self-pay | Admitting: *Deleted

## 2016-08-05 DIAGNOSIS — Z931 Gastrostomy status: Secondary | ICD-10-CM | POA: Diagnosis not present

## 2016-08-05 DIAGNOSIS — K5660 Unspecified intestinal obstruction: Secondary | ICD-10-CM | POA: Diagnosis not present

## 2016-08-05 DIAGNOSIS — R109 Unspecified abdominal pain: Secondary | ICD-10-CM | POA: Diagnosis not present

## 2016-08-05 DIAGNOSIS — K5669 Other intestinal obstruction: Secondary | ICD-10-CM | POA: Diagnosis not present

## 2016-08-05 DIAGNOSIS — K56609 Unspecified intestinal obstruction, unspecified as to partial versus complete obstruction: Secondary | ICD-10-CM | POA: Diagnosis present

## 2016-08-05 DIAGNOSIS — Z9049 Acquired absence of other specified parts of digestive tract: Secondary | ICD-10-CM

## 2016-08-05 DIAGNOSIS — K589 Irritable bowel syndrome without diarrhea: Secondary | ICD-10-CM | POA: Diagnosis present

## 2016-08-05 DIAGNOSIS — K219 Gastro-esophageal reflux disease without esophagitis: Secondary | ICD-10-CM | POA: Diagnosis present

## 2016-08-05 DIAGNOSIS — Z0189 Encounter for other specified special examinations: Secondary | ICD-10-CM

## 2016-08-05 LAB — COMPREHENSIVE METABOLIC PANEL
ALT: 44 U/L (ref 17–63)
AST: 26 U/L (ref 15–41)
Albumin: 4 g/dL (ref 3.5–5.0)
Alkaline Phosphatase: 72 U/L (ref 38–126)
Anion gap: 6 (ref 5–15)
BUN: 8 mg/dL (ref 6–20)
CHLORIDE: 101 mmol/L (ref 101–111)
CO2: 29 mmol/L (ref 22–32)
CREATININE: 0.95 mg/dL (ref 0.61–1.24)
Calcium: 9.8 mg/dL (ref 8.9–10.3)
GFR calc Af Amer: >60 mL/min (ref >60)
GFR calc non Af Amer: >60 mL/min (ref >60)
Glucose, Bld: 115 mg/dL — ABNORMAL HIGH (ref 65–99)
Potassium: 4.2 mmol/L (ref 3.5–5.1)
SODIUM: 136 mmol/L (ref 135–145)
Total Bilirubin: 1.1 mg/dL (ref 0.3–1.2)
Total Protein: 7.3 g/dL (ref 6.5–8.1)

## 2016-08-05 LAB — CBC
HCT: 45.1 % (ref 39.0–52.0)
Hemoglobin: 15.4 g/dL (ref 13.0–17.0)
MCH: 29.3 pg (ref 26.0–34.0)
MCHC: 34.1 g/dL (ref 30.0–36.0)
MCV: 85.9 fL (ref 78.0–100.0)
PLATELETS: 213 10*3/uL (ref 150–400)
RBC: 5.25 MIL/uL (ref 4.22–5.81)
RDW: 12.7 % (ref 11.5–15.5)
WBC: 5.4 10*3/uL (ref 4.0–10.5)

## 2016-08-05 LAB — LIPASE, BLOOD: LIPASE: 25 U/L (ref 11–51)

## 2016-08-05 MED ORDER — SODIUM CHLORIDE 0.9 % IV BOLUS (SEPSIS)
500.0000 mL | Freq: Once | INTRAVENOUS | Status: AC
  Administered 2016-08-05: 500 mL via INTRAVENOUS

## 2016-08-05 MED ORDER — OXYCODONE-ACETAMINOPHEN 5-325 MG PO TABS
ORAL_TABLET | ORAL | Status: AC
  Filled 2016-08-05: qty 1

## 2016-08-05 MED ORDER — ONDANSETRON 4 MG PO TBDP
ORAL_TABLET | ORAL | Status: AC
  Filled 2016-08-05: qty 1

## 2016-08-05 MED ORDER — OXYCODONE-ACETAMINOPHEN 5-325 MG PO TABS
1.0000 | ORAL_TABLET | Freq: Once | ORAL | Status: AC
  Administered 2016-08-05: 1 via ORAL

## 2016-08-05 MED ORDER — ONDANSETRON 4 MG PO TBDP
4.0000 mg | ORAL_TABLET | Freq: Once | ORAL | Status: AC | PRN
  Administered 2016-08-05: 4 mg via ORAL

## 2016-08-05 MED ORDER — MORPHINE SULFATE (PF) 4 MG/ML IV SOLN
4.0000 mg | Freq: Once | INTRAVENOUS | Status: AC
  Administered 2016-08-05: 4 mg via INTRAVENOUS
  Filled 2016-08-05: qty 1

## 2016-08-05 MED ORDER — ONDANSETRON HCL 4 MG/2ML IJ SOLN
4.0000 mg | Freq: Once | INTRAMUSCULAR | Status: AC
  Administered 2016-08-05: 4 mg via INTRAVENOUS
  Filled 2016-08-05: qty 2

## 2016-08-05 NOTE — ED Triage Notes (Signed)
Pt c/o abdominal pain since yesterday. Pt sates he has a hx of intestinal blockages. Pt states symptoms are similar. Last BM yesterday. Pt endorses nausea, denies v/d. Pt states he has been fasting since symptoms started.

## 2016-08-05 NOTE — ED Provider Notes (Signed)
[facility]-EMERGENCY DEPT Provider Note   CSN: [id number] Arrival date & time: 08/05/16  2119  By signing my name below, I, Emmanuella Mensah, attest that this documentation has been prepared under the direction and in the presence of Gilda Creasehristopher J Sina Lucchesi, MD. Electronically Signed: Angelene GiovanniEmmanuella Mensah, ED Scribe. 08/05/16. 11:48 PM.    History   Chief Complaint Chief Complaint  Patient presents with  . Abdominal Pain    HPI Comments: Steven Millingltagane A [name] is a 50 y.o. male with a hx of GER, IBS, and SBO who presents to the Emergency Department complaining of gradually worsening moderate generalized abdominal pain onset yesterday. He reports associated chills and nausea. He states that his last BM was yesterday and has not had any flatulence today. No alleviating factors noted. Pt has not tried any medications PTA. No sick contacts noted. He denies any v/d or fever.  The history is provided by the patient. No language interpreter was used.  Abdominal Pain   This is a new problem. The current episode started yesterday. The problem has been gradually worsening. The pain is located in the generalized abdominal region. The pain is moderate. Associated symptoms include nausea. Pertinent negatives include fever, diarrhea and vomiting. Nothing aggravates the symptoms. Nothing relieves the symptoms. His past medical history is significant for GERD and irritable bowel syndrome.    Past Medical History:  Diagnosis Date  . Chest wall pain   . Cough   . Diarrhea recurrent  . GERD (gastroesophageal reflux disease)   . Headache(784.0)   . Irritable bowel syndrome   . Obstructive sleep apnea    apnealink 10/23/10 AHI 5, Sp02 low 87%  . Postural lightheadedness   . SBO (small bowel obstruction) (HCC) 12/2015  . Small bowel obstruction [facility](HCC)     Patient Active Problem List   Diagnosis Date Noted  . SBO (small bowel obstruction) (HCC) 01/02/2016  . Partial small bowel obstruction (HCC) 05/07/2013  .  ALLERGIC RHINITIS 02/07/2011  . DYSPNEA 02/07/2011  . CHEST PAIN, PRECORDIAL 02/05/2011  . POSTURAL LIGHTHEADEDNESS 01/16/2011  . OBSTRUCTIVE SLEEP APNEA 10/23/2010  . Cough 08/20/2010  . GERD 03/12/2010  . Irritable bowel syndrome 03/12/2010  . DIARRHEA, RECURRENT 02/05/2010    Past Surgical History:  Procedure Laterality Date  . CHOLECYSTECTOMY  2009  . COLECTOMY  1994   segmental  . COLONOSCOPY  07/17/2011   hemorrhoids, otherwise normal including terminal ileum and random colon biopises  . GASTROSTOMY  2005   stamm  . LAPAROTOMY  2002, 2005, 2009  . SPHINCTEROTOMY  2009   lateral internal  . UPPER GASTROINTESTINAL ENDOSCOPY  07/17/2011   normal, including duodenal biopsies       Home Medications    Prior to Admission medications   Medication Sig Start Date End Date Taking? Authorizing Provider  cetirizine (ZYRTEC) 10 MG tablet Take 10 mg by mouth daily as needed for allergies.   Yes Historical Provider, MD  omeprazole (PRILOSEC) 40 MG capsule Take 40 mg by mouth daily. 01/25/16  Yes Historical Provider, MD  NEXIUM 40 MG capsule TAKE ONE CAPSULE BY MOUTH EVERY DAY Patient not taking: Reported on 08/06/2016    Shade FloodJeffrey R Greene, MD  ondansetron (ZOFRAN-ODT) 4 MG disintegrating tablet Take 1 tablet (4 mg total) by mouth every 6 (six) hours as needed for nausea. Patient not taking: Reported on 08/06/2016 01/06/16   Ashok NorrisEmina Riebock, NP    Family History Family History  Problem Relation Age of Onset  . Cirrhosis Father  Social History Social History  Substance Use Topics  . Smoking status: Never Smoker  . Smokeless tobacco: Never Used  . Alcohol use No     Allergies   Review of patient's allergies indicates no known allergies.   Review of Systems Review of Systems  Constitutional: Positive for chills. Negative for fever.  Gastrointestinal: Positive for abdominal pain and nausea. Negative for diarrhea and vomiting.  All other systems reviewed and are  negative.    Physical Exam Updated Vital Signs BP 110/80   Pulse 83   Temp 98.1 F (36.7 C) (Oral)   Resp 14   Ht 6\' 1"  (1.854 m)   Wt 200 lb (90.7 kg)   SpO2 97%   BMI 26.39 kg/m   Physical Exam  Constitutional: He is oriented to person, place, and time. He appears well-developed and well-nourished. No distress.  HENT:  Head: Normocephalic and atraumatic.  Right Ear: Hearing normal.  Left Ear: Hearing normal.  Nose: Nose normal.  Mouth/Throat: Oropharynx is clear and moist and mucous membranes are normal.  Eyes: Conjunctivae and EOM are normal. Pupils are equal, round, and reactive to light.  Neck: Normal range of motion. Neck supple.  Cardiovascular: Regular rhythm, S1 normal and S2 normal.  Exam reveals no gallop and no friction rub.   No murmur heard. Pulmonary/Chest: Effort normal and breath sounds normal. No respiratory distress. He exhibits no tenderness.  Abdominal: Soft. Normal appearance. He exhibits distension. There is no hepatosplenomegaly. There is no tenderness. There is no rebound, no guarding, no tenderness at McBurney's point and negative Murphy's sign. No hernia.  Tympanetic  Absent bowel sounds  Musculoskeletal: Normal range of motion.  Tremors in BUE  Neurological: He is alert and oriented to person, place, and time. He has normal strength. No cranial nerve deficit or sensory deficit. Coordination normal. GCS eye subscore is 4. GCS verbal subscore is 5. GCS motor subscore is 6.  Skin: Skin is warm, dry and intact. No rash noted. No cyanosis.  Psychiatric: He has a normal mood and affect. His speech is normal and behavior is normal. Thought content normal.  Nursing note and vitals reviewed.    ED Treatments / Results  DIAGNOSTIC STUDIES: Oxygen Saturation is 100% on RA, normal by my interpretation.    COORDINATION OF CARE: 11:35 PM- Pt advised of plan for treatment and pt agrees. He will receive lab work and abdominal x-ray for further evaluation. He  will also receive IV fluids, morphine IM, and Zofran IM.    Labs (all labs ordered are listed, but only abnormal results are displayed) Labs Reviewed  COMPREHENSIVE METABOLIC PANEL - Abnormal; Notable for the following:       Result Value   Glucose, Bld 115 (*)    All other components within normal limits  LIPASE, BLOOD  CBC  URINALYSIS, ROUTINE W REFLEX MICROSCOPIC (NOT AT [facility])  I-STAT CG4 LACTIC ACID, ED    EKG  EKG Interpretation None       Radiology Dg Abd Acute W/chest  Result Date: 08/06/2016 CLINICAL DATA:  Mid abdominal pain since yesterday. Nausea and vomiting. Prior history of small bowel obstructions. EXAM: DG ABDOMEN ACUTE W/ 1V CHEST COMPARISON:  01/05/2016 FINDINGS: Normal heart size and pulmonary vascularity. No focal airspace disease or consolidation in the lungs. No blunting of costophrenic angles. No pneumothorax. Mediastinal contours appear intact. Mid abdominal dilated gas-filled small bowel with multiple air-fluid levels demonstrated. Changes are consistent with small bowel obstruction. No free air. Scattered stool in  the colon. No colonic distention. No radiopaque stones. Visualized bones appear intact. Surgical clips in the right upper quadrant. IMPRESSION: No evidence of active pulmonary disease. Changes of small bowel obstruction with distended gas-filled small bowel and multiple air-fluid levels present. Electronically Signed   By: Burman Nieves M.D.   On: 08/06/2016 00:40    Procedures Procedures (including critical care time)  Medications Ordered in ED Medications  ondansetron (ZOFRAN-ODT) disintegrating tablet 4 mg (4 mg Oral Given 08/05/16 2131)  oxyCODONE-acetaminophen (PERCOCET/ROXICET) 5-325 MG per tablet 1 tablet (1 tablet Oral Given 08/05/16 2132)  sodium chloride 0.9 % bolus 500 mL (0 mLs Intravenous Stopped 08/06/16 0059)  morphine 4 MG/ML injection 4 mg (4 mg Intravenous Given 08/05/16 2353)  ondansetron (ZOFRAN) injection 4 mg (4 mg  Intravenous Given 08/05/16 2353)     Initial Impression / Assessment and Plan / ED Course  Gilda Crease, MD has reviewed the triage vital signs and the nursing notes.  Pertinent labs & imaging results that were available during my care of the patient were reviewed by me and considered in my medical decision making (see chart for details).  Clinical Course   Patient presents to the emergency para for evaluation of abdominal pain. Patient has had progressive worsening of generalized abdominal pain, distention with nausea and no bowel movement or flatulence for 24 hours. Patient reports previous history of small bowel obstruction with similar symptoms. Examination is consistent with SBO and plain film does confirm. As patient has had multiple obstructions in the past, no advanced imaging performed. Discussed with Dr. Luisa Hart, on-call for general surgery. He will see the patient.  Final Clinical Impressions(s) / ED Diagnoses   Final diagnoses:  SBO (small bowel obstruction) (HCC)    New Prescriptions New Prescriptions   No medications on file   I personally performed the services described in this documentation, which was scribed in my presence. The recorded information has been reviewed and is accurate.    Gilda Crease, MD 08/06/16 [phone]

## 2016-08-06 ENCOUNTER — Emergency Department (HOSPITAL_COMMUNITY): Payer: BLUE CROSS/BLUE SHIELD

## 2016-08-06 ENCOUNTER — Inpatient Hospital Stay (HOSPITAL_COMMUNITY): Payer: BLUE CROSS/BLUE SHIELD

## 2016-08-06 DIAGNOSIS — K5660 Unspecified intestinal obstruction: Secondary | ICD-10-CM | POA: Diagnosis not present

## 2016-08-06 DIAGNOSIS — Z9049 Acquired absence of other specified parts of digestive tract: Secondary | ICD-10-CM | POA: Diagnosis not present

## 2016-08-06 DIAGNOSIS — K589 Irritable bowel syndrome without diarrhea: Secondary | ICD-10-CM | POA: Diagnosis present

## 2016-08-06 DIAGNOSIS — K219 Gastro-esophageal reflux disease without esophagitis: Secondary | ICD-10-CM | POA: Diagnosis present

## 2016-08-06 DIAGNOSIS — R109 Unspecified abdominal pain: Secondary | ICD-10-CM | POA: Diagnosis present

## 2016-08-06 DIAGNOSIS — R1084 Generalized abdominal pain: Secondary | ICD-10-CM | POA: Diagnosis not present

## 2016-08-06 DIAGNOSIS — Z931 Gastrostomy status: Secondary | ICD-10-CM | POA: Diagnosis not present

## 2016-08-06 LAB — URINALYSIS, ROUTINE W REFLEX MICROSCOPIC
BILIRUBIN URINE: NEGATIVE
GLUCOSE, UA: NEGATIVE mg/dL
Hgb urine dipstick: NEGATIVE
KETONES UR: NEGATIVE mg/dL
Leukocytes, UA: NEGATIVE
Nitrite: NEGATIVE
PH: 7 (ref 5.0–8.0)
Protein, ur: NEGATIVE mg/dL
Specific Gravity, Urine: 1.022 (ref 1.005–1.030)

## 2016-08-06 LAB — CBC
HEMATOCRIT: 44.4 % (ref 39.0–52.0)
Hemoglobin: 15 g/dL (ref 13.0–17.0)
MCH: 28.8 pg (ref 26.0–34.0)
MCHC: 33.8 g/dL (ref 30.0–36.0)
MCV: 85.4 fL (ref 78.0–100.0)
Platelets: 196 10*3/uL (ref 150–400)
RBC: 5.2 MIL/uL (ref 4.22–5.81)
RDW: 12.7 % (ref 11.5–15.5)
WBC: 6.3 10*3/uL (ref 4.0–10.5)

## 2016-08-06 LAB — I-STAT CG4 LACTIC ACID, ED: Lactic Acid, Venous: 1.68 mmol/L (ref 0.5–1.9)

## 2016-08-06 LAB — CREATININE, SERUM
Creatinine, Ser: 0.91 mg/dL (ref 0.61–1.24)
GFR calc Af Amer: >60 mL/min (ref >60)

## 2016-08-06 MED ORDER — HYDROMORPHONE HCL 1 MG/ML IJ SOLN: 1.0000 mg | INTRAMUSCULAR | Status: DC | PRN

## 2016-08-06 MED ORDER — ONDANSETRON HCL 4 MG/2ML IJ SOLN: 4.0000 mg | Freq: Four times a day (QID) | INTRAMUSCULAR | Status: DC | PRN

## 2016-08-06 MED ORDER — ONDANSETRON 4 MG PO TBDP: 4.0000 mg | ORAL_TABLET | Freq: Four times a day (QID) | ORAL | Status: DC | PRN

## 2016-08-06 MED ORDER — ONDANSETRON HCL 4 MG/2ML IJ SOLN
4.0000 mg | Freq: Four times a day (QID) | INTRAMUSCULAR | Status: DC | PRN
  Administered 2016-08-08 (×3): 4 mg via INTRAVENOUS
  Filled 2016-08-06 (×3): qty 2

## 2016-08-06 MED ORDER — FAMOTIDINE IN NACL 20-0.9 MG/50ML-% IV SOLN
20.0000 mg | Freq: Two times a day (BID) | INTRAVENOUS | Status: DC
  Administered 2016-08-06 – 2016-08-09 (×7): 20 mg via INTRAVENOUS
  Filled 2016-08-06 (×9): qty 50

## 2016-08-06 MED ORDER — HYDROMORPHONE HCL 1 MG/ML IJ SOLN
1.0000 mg | INTRAMUSCULAR | Status: DC | PRN
  Administered 2016-08-06: 1 mg via INTRAVENOUS
  Filled 2016-08-06: qty 1

## 2016-08-06 MED ORDER — ENOXAPARIN SODIUM 40 MG/0.4ML ~~LOC~~ SOLN: 40.0000 mg | SUBCUTANEOUS | Status: DC

## 2016-08-06 MED ORDER — DIATRIZOATE MEGLUMINE & SODIUM 66-10 % PO SOLN
90.0000 mL | Freq: Once | ORAL | Status: DC
  Filled 2016-08-06: qty 90

## 2016-08-06 MED ORDER — KCL IN DEXTROSE-NACL 20-5-0.9 MEQ/L-%-% IV SOLN
INTRAVENOUS | Status: DC
  Administered 2016-08-06: 05:00:00 via INTRAVENOUS
  Administered 2016-08-06: 1000 mL via INTRAVENOUS
  Administered 2016-08-06: 15:00:00 via INTRAVENOUS
  Administered 2016-08-07 (×2): 125 mL via INTRAVENOUS
  Administered 2016-08-08: 13:00:00 via INTRAVENOUS
  Administered 2016-08-08 – 2016-08-09 (×3): 1000 mL via INTRAVENOUS
  Filled 2016-08-06 (×13): qty 1000

## 2016-08-06 MED ORDER — ENOXAPARIN SODIUM 40 MG/0.4ML ~~LOC~~ SOLN
40.0000 mg | SUBCUTANEOUS | Status: DC
  Administered 2016-08-06 – 2016-08-09 (×4): 40 mg via SUBCUTANEOUS
  Filled 2016-08-06 (×4): qty 0.4

## 2016-08-06 NOTE — H&P (Signed)
[name] is an 50 y.o. male.   Chief Complaint: abdominal pain HPI: asked to see by EDP for 1 day hx of diffuse crampy abdominal pain Hx of multiple laparotomies in [place] Has hx of multiple pSBO that have been managed medically CT show SBO Last BM this am   No flatus since   Past Medical History:  Diagnosis Date  . Chest wall pain   . Cough   . Diarrhea recurrent  . GERD (gastroesophageal reflux disease)   . Headache(784.0)   . Irritable bowel syndrome   . Obstructive sleep apnea    apnealink 10/23/10 AHI 5, Sp02 low 87%  . Postural lightheadedness   . SBO (small bowel obstruction) ([place]) 12/2015  . Small bowel obstruction [facility])     Past Surgical History:  Procedure Laterality Date  . CHOLECYSTECTOMY  2009  . COLECTOMY  1994   segmental  . COLONOSCOPY  07/17/2011   hemorrhoids, otherwise normal including terminal ileum and random colon biopises  . GASTROSTOMY  2005   stamm  . LAPAROTOMY  2002, 2005, 2009  . SPHINCTEROTOMY  2009   lateral internal  . UPPER GASTROINTESTINAL ENDOSCOPY  07/17/2011   normal, including duodenal biopsies    Family History  Problem Relation Age of Onset  . Cirrhosis Father    Social History:  reports that he has never smoked. He has never used smokeless tobacco. He reports that he does not drink alcohol or use drugs.  Allergies: No Known Allergies   (Not in a hospital admission)  Results for orders placed or performed during the hospital encounter of 08/05/16 (from the past 48 hour(s))  Lipase, blood     Status: None   Collection Time: 08/05/16  9:30 PM  Result Value Ref Range   Lipase 25 11 - 51 U/L  Comprehensive metabolic panel     Status: Abnormal   Collection Time: 08/05/16  9:30 PM  Result Value Ref Range   Sodium 136 135 - 145 mmol/L   Potassium 4.2 3.5 - 5.1 mmol/L   Chloride 101 101 - 111 mmol/L   CO2 29 22 - 32 mmol/L   Glucose, Bld 115 (H) 65 - 99 mg/dL   BUN 8 6 - 20 mg/dL   Creatinine, Ser 0.95 0.61 - 1.24 mg/dL    Calcium 9.8 8.9 - 10.3 mg/dL   Total Protein 7.3 6.5 - 8.1 g/dL   Albumin 4.0 3.5 - 5.0 g/dL   AST 26 15 - 41 U/L   ALT 44 17 - 63 U/L   Alkaline Phosphatase 72 38 - 126 U/L   Total Bilirubin 1.1 0.3 - 1.2 mg/dL   GFR calc non Af Amer >60 >60 mL/min   GFR calc Af Amer >60 >60 mL/min    Comment: (NOTE) The eGFR has been calculated using the CKD EPI equation. This calculation has not been validated in all clinical situations. eGFR's persistently <60 mL/min signify possible Chronic Kidney Disease.    Anion gap 6 5 - 15  CBC     Status: None   Collection Time: 08/05/16  9:30 PM  Result Value Ref Range   WBC 5.4 4.0 - 10.5 K/uL   RBC 5.25 4.22 - 5.81 MIL/uL   Hemoglobin 15.4 13.0 - 17.0 g/dL   HCT 45.1 39.0 - 52.0 %   MCV 85.9 78.0 - 100.0 fL   MCH 29.3 26.0 - 34.0 pg   MCHC 34.1 30.0 - 36.0 g/dL   RDW 12.7 11.5 - 15.5 %  Platelets 213 150 - 400 K/uL  I-Stat CG4 Lactic Acid, ED     Status: None   Collection Time: 08/06/16 12:03 AM  Result Value Ref Range   Lactic Acid, Venous 1.68 0.5 - 1.9 mmol/L   Dg Abd Acute W/chest  Result Date: 08/06/2016 CLINICAL DATA:  Mid abdominal pain since yesterday. Nausea and vomiting. Prior history of small bowel obstructions. EXAM: DG ABDOMEN ACUTE W/ 1V CHEST COMPARISON:  01/05/2016 FINDINGS: Normal heart size and pulmonary vascularity. No focal airspace disease or consolidation in the lungs. No blunting of costophrenic angles. No pneumothorax. Mediastinal contours appear intact. Mid abdominal dilated gas-filled small bowel with multiple air-fluid levels demonstrated. Changes are consistent with small bowel obstruction. No free air. Scattered stool in the colon. No colonic distention. No radiopaque stones. Visualized bones appear intact. Surgical clips in the right upper quadrant. IMPRESSION: No evidence of active pulmonary disease. Changes of small bowel obstruction with distended gas-filled small bowel and multiple air-fluid levels present.  Electronically Signed   By: Lucienne Capers M.D.   On: 08/06/2016 00:40    Review of Systems  Constitutional: Positive for chills and fever.  HENT: Negative.   Respiratory: Negative.   Cardiovascular: Negative.   Gastrointestinal: Positive for abdominal pain, constipation, nausea and vomiting.  Musculoskeletal: Negative.   Skin: Negative.   Neurological: Negative.     Blood pressure 115/82, pulse 75, temperature 98.1 F (36.7 C), temperature source Oral, resp. rate 14, height 6' 1"  (1.854 m), weight 90.7 kg (200 lb), SpO2 98 %. Physical Exam  Constitutional: He appears well-developed and well-nourished.  HENT:  Head: Normocephalic.  Eyes: Pupils are equal, round, and reactive to light.  Neck: Normal range of motion. Neck supple.  Cardiovascular: Normal rate and regular rhythm.   Respiratory: Effort normal and breath sounds normal.  GI: He exhibits distension and mass. There is tenderness. There is rebound and guarding.    Musculoskeletal: Normal range of motion.  Neurological: He is alert.  Skin: Skin is warm.  Psychiatric: He has a normal mood and affect. His behavior is normal.     Assessment/Plan SBO Pt refuses NGT I told him this would be best care for him. NPO IVF Repeat films labs in am   Darrin Koman A., MD 08/06/2016, 1:49 AM

## 2016-08-06 NOTE — ED Notes (Signed)
Admitting MD at bedside.

## 2016-08-06 NOTE — ED Notes (Signed)
Patient transported to x-ray. ?

## 2016-08-06 NOTE — Progress Notes (Signed)
Pt was admitted from ED per stretcher accompanied by a nurse tech. Fully oriented , Self introduced to pt, ID bracelet checked, pt oriented to unit and pt care equipments. Fall prevention plan discussed with pt, call light and phone within reach and pt able to demonstrate how to use them, prescribed treatment started and will continue to monitor pt

## 2016-08-06 NOTE — Progress Notes (Signed)
08/06/16  1330  Patient refused NGT. Per radiology pt needs to have NGT for procedure.  Dr Emmaline KluverSimaan was paged twice to notify of patients refusal of the NGT, but MD has not called back.

## 2016-08-06 NOTE — Progress Notes (Signed)
[name]     Subjective: Nausea and belching. No flatus or BM. Reports abdominal "tightness" and intermittent pain.   Objective: Vital signs in last 24 hours: Temp:  [97.6 F (36.4 C)-98.9 F (37.2 C)] 98.4 F (36.9 C) (08/30 0823) Pulse Rate:  [64-83] 79 (08/30 0840) Resp:  [14-20] 19 (08/30 0823) BP: (100-132)/(62-116) 111/67 (08/30 0823) SpO2:  [88 %-100 %] 96 % (08/30 0840) Weight:  [90.7 kg (200 lb)-91.8 kg (202 lb 4.8 oz)] 91.8 kg (202 lb 4.8 oz) (08/30 0307) Last BM Date: 08/06/16  Intake/Output from previous day: 08/29 0701 - 08/30 0700 In: 1500 [I.V.:1500] Out: -  Intake/Output this shift: No intake/output data recorded.  PE: Gen:  Alert, NAD, pleasant Card:  RRR, no M/G/R heard Pulm:  CTA, no W/R/R Abd: Soft, distended, previous laparotomy scar  Lab Results:   Recent Labs  08/05/16 2130 08/06/16 0225  WBC 5.4 6.3  HGB 15.4 15.0  HCT 45.1 44.4  PLT 213 196   BMET  Recent Labs  08/05/16 2130 08/06/16 0225  NA 136  --   K 4.2  --   CL 101  --   CO2 29  --   GLUCOSE 115*  --   BUN 8  --   CREATININE 0.95 0.91  CALCIUM 9.8  --    PT/INR No results for input(s): LABPROT, INR in the last 72 hours. CMP     Component Value Date/Time   NA 136 08/05/2016 2130   K 4.2 08/05/2016 2130   CL 101 08/05/2016 2130   CO2 29 08/05/2016 2130   GLUCOSE 115 (H) 08/05/2016 2130   BUN 8 08/05/2016 2130   CREATININE 0.91 08/06/2016 0225   CREATININE 0.94 06/04/2014 1734   CALCIUM 9.8 08/05/2016 2130   PROT 7.3 08/05/2016 2130   ALBUMIN 4.0 08/05/2016 2130   AST 26 08/05/2016 2130   ALT 44 08/05/2016 2130   ALKPHOS 72 08/05/2016 2130   BILITOT 1.1 08/05/2016 2130   GFRNONAA >60 08/06/2016 0225   GFRAA >60 08/06/2016 0225   Lipase     Component Value Date/Time   LIPASE 25 08/05/2016 2130       Studies/Results: Dg Abd 1 View  Result Date: 08/06/2016 CLINICAL DATA:  Generalized abdominal pain and vomiting for 3  days EXAM: ABDOMEN - 1 VIEW COMPARISON:  August 05, 2016 FINDINGS: There is persistent small bowel dilatation in a pattern concerning for a degree of bowel obstruction. Severe ileus is a differential consideration. No free air is evident on this supine examination. There are phleboliths in the pelvis. IMPRESSION: Bowel gas pattern remains concerning for a degree of bowel obstruction. No free air is evident on this supine examination. Electronically Signed   By: Bretta BangWilliam  Woodruff III M.D.   On: 08/06/2016 07:44   Dg Abd Acute W/chest  Result Date: 08/06/2016 CLINICAL DATA:  Mid abdominal pain since yesterday. Nausea and vomiting. Prior history of small bowel obstructions. EXAM: DG ABDOMEN ACUTE W/ 1V CHEST COMPARISON:  01/05/2016 FINDINGS: Normal heart size and pulmonary vascularity. No focal airspace disease or consolidation in the lungs. No blunting of costophrenic angles. No pneumothorax. Mediastinal contours appear intact. Mid abdominal dilated gas-filled small bowel with multiple air-fluid levels demonstrated. Changes are consistent with small bowel obstruction. No free air. Scattered stool in the colon. No colonic distention. No radiopaque stones. Visualized bones appear intact. Surgical clips in the right upper quadrant. IMPRESSION: No evidence of active pulmonary disease. Changes of small bowel obstruction  with distended gas-filled small bowel and multiple air-fluid levels present. Electronically Signed   By: Burman Nieves M.D.   On: 08/06/2016 00:40    Anti-infectives: Anti-infectives    None     Assessment/Plan Recurrent SBO in the setting of multiple abdominal surgeres Last admission January 2017 -place NGT and start SB protocol  FEN: NPO, IVF DVT Proph: SCD's, lovenox  Plan: gastrografin and abdominal films    LOS: 0 days    [name] , [facility] 08/06/2016, 11:15 AM Pager: [phone] Consults: [phone] Mon-Fri 7:00 am-4:30 pm Sat-Sun  7:00 am-11:30 am

## 2016-08-07 LAB — BASIC METABOLIC PANEL
Anion gap: 8 (ref 5–15)
BUN: 5 mg/dL — ABNORMAL LOW (ref 6–20)
CALCIUM: 9.2 mg/dL (ref 8.9–10.3)
CO2: 25 mmol/L (ref 22–32)
CREATININE: 0.96 mg/dL (ref 0.61–1.24)
Chloride: 104 mmol/L (ref 101–111)
Glucose, Bld: 120 mg/dL — ABNORMAL HIGH (ref 65–99)
Potassium: 3.7 mmol/L (ref 3.5–5.1)
SODIUM: 137 mmol/L (ref 135–145)

## 2016-08-07 MED ORDER — DIATRIZOATE MEGLUMINE & SODIUM 66-10 % PO SOLN
90.0000 mL | Freq: Once | ORAL | Status: DC
Start: 2016-08-07 — End: 2016-08-09
  Filled 2016-08-07: qty 90

## 2016-08-07 MED ORDER — ORAL CARE MOUTH RINSE
15.0000 mL | Freq: Two times a day (BID) | OROMUCOSAL | Status: DC
  Administered 2016-08-07 – 2016-08-09 (×4): 15 mL via OROMUCOSAL

## 2016-08-07 NOTE — Progress Notes (Signed)
This shift patient is still refusing NGT.  Had a watery stool.  Patient states this is how he ususally does when he is getting ready to defacate.  Refuses pain medication at this time. Will continue to monitor.

## 2016-08-07 NOTE — Progress Notes (Signed)
[facility] Progress Note     Subjective: Refused NGT yesterday secondary to frustration - states his IVF ran out at 1300 and it took 2 h and 07 mins for a nurse to come to his room. 2 watery stools last night, no flatus or BM since that time. Ambulating well. Abdominal pain intermittent, crampy, and rated a 5/10  Objective: Vital signs in last 24 hours: Temp:  [97.9 F (36.6 C)-98.9 F (37.2 C)] 98.9 F (37.2 C) (08/31 0536) Pulse Rate:  [83-92] 83 (08/31 0536) Resp:  [18] 18 (08/31 0536) BP: (110-112)/(68-78) 112/78 (08/31 0536) SpO2:  [98 %-100 %] 98 % (08/31 0536) Last BM Date: 08/06/16 (patient had watery BM over night)  Intake/Output from previous day: 08/30 0701 - 08/31 0700 In: 2974.6 [I.V.:2874.6; IV Piggyback:100] Out: 1400 [Urine:1400] Intake/Output this shift: Total I/O In: 389.6 [I.V.:389.6] Out: -   PE: Gen:  Alert, NAD, pleasant Card:  RRR, no M/G/R  Pulm:  CTA, no W/R/R Abd: Soft, TTP LUQ, umbilicus, and RLQ, ND, hypoactive BS, previous laparotomy scar (last operation 2009)  Lab Results:   Recent Labs  08/05/16 2130 08/06/16 0225  WBC 5.4 6.3  HGB 15.4 15.0  HCT 45.1 44.4  PLT 213 196   BMET  Recent Labs  08/05/16 2130 08/06/16 0225  NA 136  --   K 4.2  --   CL 101  --   CO2 29  --   GLUCOSE 115*  --   BUN 8  --   CREATININE 0.95 0.91  CALCIUM 9.8  --    PT/INR No results for input(s): LABPROT, INR in the last 72 hours. CMP     Component Value Date/Time   NA 136 08/05/2016 2130   K 4.2 08/05/2016 2130   CL 101 08/05/2016 2130   CO2 29 08/05/2016 2130   GLUCOSE 115 (H) 08/05/2016 2130   BUN 8 08/05/2016 2130   CREATININE 0.91 08/06/2016 0225   CREATININE 0.94 06/04/2014 1734   CALCIUM 9.8 08/05/2016 2130   PROT 7.3 08/05/2016 2130   ALBUMIN 4.0 08/05/2016 2130   AST 26 08/05/2016 2130   ALT 44 08/05/2016 2130   ALKPHOS 72 08/05/2016 2130   BILITOT 1.1 08/05/2016 2130   GFRNONAA >60 08/06/2016 0225   GFRAA >60  08/06/2016 0225   Lipase     Component Value Date/Time   LIPASE 25 08/05/2016 2130   Studies/Results: Dg Abd 1 View  Result Date: 08/06/2016 CLINICAL DATA:  Generalized abdominal pain and vomiting for 3 days EXAM: ABDOMEN - 1 VIEW COMPARISON:  August 05, 2016 FINDINGS: There is persistent small bowel dilatation in a pattern concerning for a degree of bowel obstruction. Severe ileus is a differential consideration. No free air is evident on this supine examination. There are phleboliths in the pelvis. IMPRESSION: Bowel gas pattern remains concerning for a degree of bowel obstruction. No free air is evident on this supine examination. Electronically Signed   By: Bretta Bang III M.D.   On: 08/06/2016 07:44   Dg Abd Acute W/chest  Result Date: 08/06/2016 CLINICAL DATA:  Mid abdominal pain since yesterday. Nausea and vomiting. Prior history of small bowel obstructions. EXAM: DG ABDOMEN ACUTE W/ 1V CHEST COMPARISON:  01/05/2016 FINDINGS: Normal heart size and pulmonary vascularity. No focal airspace disease or consolidation in the lungs. No blunting of costophrenic angles. No pneumothorax. Mediastinal contours appear intact. Mid abdominal dilated gas-filled small bowel with multiple air-fluid levels demonstrated. Changes are consistent with small bowel obstruction.  No free air. Scattered stool in the colon. No colonic distention. No radiopaque stones. Visualized bones appear intact. Surgical clips in the right upper quadrant. IMPRESSION: No evidence of active pulmonary disease. Changes of small bowel obstruction with distended gas-filled small bowel and multiple air-fluid levels present. Electronically Signed   By: Burman NievesWilliam  Stevens M.D.   On: 08/06/2016 00:40    Anti-infectives: Anti-infectives    None     Assessment/Plan Recurrent SBO in the setting of multiple abdominal surgeres Last admission January 2017 - refusing NGT - patient will drink gastrografin today, abdominal films  ordered  FEN: NPO, IVF DVT Proph: SCD's, lovenox  Plan: PO gastrografin and abdominal films  Ambulate    LOS: 1 day    Adam PhenixElizabeth S Sharetta Ricchio , [facility] 08/07/2016, 9:37 AM Pager: [phone] Consults: [phone] Mon-Fri 7:00 am-4:30 pm Sat-Sun 7:00 am-11:30 am

## 2016-08-08 ENCOUNTER — Inpatient Hospital Stay (HOSPITAL_COMMUNITY): Payer: BLUE CROSS/BLUE SHIELD

## 2016-08-08 LAB — GLUCOSE, CAPILLARY
GLUCOSE-CAPILLARY: 146 mg/dL — AB (ref 65–99)
GLUCOSE-CAPILLARY: 121 mg/dL — AB (ref 65–99)
GLUCOSE-CAPILLARY: 108 mg/dL — AB (ref 65–99)
Glucose-Capillary: 101 mg/dL — ABNORMAL HIGH (ref 65–99)
Glucose-Capillary: 113 mg/dL — ABNORMAL HIGH (ref 65–99)
Glucose-Capillary: 118 mg/dL — ABNORMAL HIGH (ref 65–99)

## 2016-08-08 MED ORDER — PHENOL 1.4 % MT LIQD
1.0000 | OROMUCOSAL | Status: DC | PRN
  Administered 2016-08-08: 1 via OROMUCOSAL
  Filled 2016-08-08: qty 177

## 2016-08-08 NOTE — Progress Notes (Signed)
[facility] Progress Note     Subjective: Reports that the gastrografin that I ordered was never brought to him yesterday. 2 watery BMs yesterday, 1 episode of vomiting this morning, 2 BMs this morning. States he feels better and denies pain. +flatus with BMs. Ambulating multiple times daily.  Objective: Vital signs in last 24 hours: Temp:  [98.2 F (36.8 C)-99.3 F (37.4 C)] 98.9 F (37.2 C) (09/01 0500) Pulse Rate:  [73-86] 73 (09/01 0500) Resp:  [15-18] 18 (09/01 0500) BP: (102-113)/(57-74) 102/71 (09/01 0500) SpO2:  [92 %-99 %] 99 % (09/01 0500) Last BM Date: 08/07/16 (very small formed)  Intake/Output from previous day: 08/31 0701 - 09/01 0700 In: 389.6 [I.V.:389.6] Out: 1650 [Urine:1650] Intake/Output this shift: No intake/output data recorded.  PE: Gen:  Alert, NAD, pleasant Abd: Soft, mildly tender, ND, +BS, previous laparotomy incision  Ext:  No erythema, edema, or tenderness   Lab Results:   Recent Labs  08/05/16 2130 08/06/16 0225  WBC 5.4 6.3  HGB 15.4 15.0  HCT 45.1 44.4  PLT 213 196   BMET  Recent Labs  08/05/16 2130 08/06/16 0225 08/07/16 1035  NA 136  --  137  K 4.2  --  3.7  CL 101  --  104  CO2 29  --  25  GLUCOSE 115*  --  120*  BUN 8  --  5*  CREATININE 0.95 0.91 0.96  CALCIUM 9.8  --  9.2   PT/INR No results for input(s): LABPROT, INR in the last 72 hours. CMP     Component Value Date/Time   NA 137 08/07/2016 1035   K 3.7 08/07/2016 1035   CL 104 08/07/2016 1035   CO2 25 08/07/2016 1035   GLUCOSE 120 (H) 08/07/2016 1035   BUN 5 (L) 08/07/2016 1035   CREATININE 0.96 08/07/2016 1035   CREATININE 0.94 06/04/2014 1734   CALCIUM 9.2 08/07/2016 1035   PROT 7.3 08/05/2016 2130   ALBUMIN 4.0 08/05/2016 2130   AST 26 08/05/2016 2130   ALT 44 08/05/2016 2130   ALKPHOS 72 08/05/2016 2130   BILITOT 1.1 08/05/2016 2130   GFRNONAA >60 08/07/2016 1035   GFRAA >60 08/07/2016 1035   Lipase     Component Value  Date/Time   LIPASE 25 08/05/2016 2130   Studies/Results: No results found.  Anti-infectives: Anti-infectives    None     Assessment/Plan Recurrent SBO in the setting of multiple abdominal surgeres Last admission January 2017 - refused NGT on admission, gastrografin never administered  - started on clear liquids overnight  - having BMs, abdomen soft with only mild tenderness.   Plan: Will repeat abdominal film since patient is vomiting and has not had a repeat film this admission. If obstruction pattern improved and tolerating PO, will advance to soft diet and prepare for discharge.    LOS: 2 days    Adam PhenixElizabeth S Simaan , [facility] 08/08/2016, 12:41 PM Pager: [phone] Consults: [phone] Mon-Fri 7:00 am-4:30 pm Sat-Sun 7:00 am-11:30 am

## 2016-08-09 ENCOUNTER — Encounter (INDEPENDENT_AMBULATORY_CARE_PROVIDER_SITE_OTHER): Payer: Self-pay | Admitting: Physician Assistant

## 2016-08-09 LAB — GLUCOSE, CAPILLARY
GLUCOSE-CAPILLARY: 134 mg/dL — AB (ref 65–99)
Glucose-Capillary: 122 mg/dL — ABNORMAL HIGH (ref 65–99)

## 2016-08-09 MED ORDER — LORATADINE 10 MG PO TABS
10.0000 mg | ORAL_TABLET | Freq: Every day | ORAL | Status: DC
  Administered 2016-08-09: 10 mg via ORAL
  Filled 2016-08-09: qty 1

## 2016-08-09 NOTE — Progress Notes (Signed)
Patient ID: [name], male   DOB: [birth year], 50 y.o.   MRN: [id number]  [facility] Progress Note     Subjective: No n/v since yesterday morning. He has been tolerating clear liquids. 2 BM's yesterday, none today. +flatus. Denies any current abdominal pain.   Objective: Vital signs in last 24 hours: Temp:  [98.1 F (36.7 C)-98.9 F (37.2 C)] 98.1 F (36.7 C) (09/02 0500) Pulse Rate:  [60-72] 60 (09/02 0500) Resp:  [18-19] 19 (09/02 0500) BP: (108-118)/(66-80) 108/66 (09/02 0500) SpO2:  [98 %-99 %] 98 % (09/02 0500) Last BM Date: 08/08/16  Intake/Output from previous day: 09/01 0701 - 09/02 0700 In: 3143.8 [I.V.:3043.8; IV Piggyback:100] Out: 800 [Urine:800] Intake/Output this shift: No intake/output data recorded.  PE: Gen:  Alert, NAD, pleasant Abd: Soft, NT, ND, +BS, previous laparotomy incision  Ext:  No erythema, edema, or tenderness   Lab Results:  No results for input(s): WBC, HGB, HCT, PLT in the last 72 hours. BMET  Recent Labs  08/07/16 1035  NA 137  K 3.7  CL 104  CO2 25  GLUCOSE 120*  BUN 5*  CREATININE 0.96  CALCIUM 9.2   PT/INR No results for input(s): LABPROT, INR in the last 72 hours. CMP     Component Value Date/Time   NA 137 08/07/2016 1035   K 3.7 08/07/2016 1035   CL 104 08/07/2016 1035   CO2 25 08/07/2016 1035   GLUCOSE 120 (H) 08/07/2016 1035   BUN 5 (L) 08/07/2016 1035   CREATININE 0.96 08/07/2016 1035   CREATININE 0.94 06/04/2014 1734   CALCIUM 9.2 08/07/2016 1035   PROT 7.3 08/05/2016 2130   ALBUMIN 4.0 08/05/2016 2130   AST 26 08/05/2016 2130   ALT 44 08/05/2016 2130   ALKPHOS 72 08/05/2016 2130   BILITOT 1.1 08/05/2016 2130   GFRNONAA >60 08/07/2016 1035   GFRAA >60 08/07/2016 1035   Lipase     Component Value Date/Time   LIPASE 25 08/05/2016 2130       Studies/Results: Dg Abd Portable 1v  Result Date: 08/08/2016 CLINICAL DATA:  Small bowel obstruction EXAM: PORTABLE ABDOMEN - 1 VIEW  COMPARISON:  August 06, 2016 FINDINGS: There are persistent loops of dilated small bowel. Small bowel in the left mid abdomen appears slightly more dilated compared to recent prior study. No free air is evident on this supine examination. There is a focal area of air in the left upper quadrant which is unchanged and appears somewhat featureless. It is difficult to ascertain whether this air is within small or large bowel. Its appearance is frankly rather unusual. IMPRESSION: Persistent evidence of a degree of small bowel obstruction. Somewhat unusual persistent area of air in the left upper quadrant, presumably within bowel but difficult to confirm. Given the persistence of this finding and the apparent persistence of bowel obstruction, correlation with CT may be advisable for further assessment at this time. These results will be called to the ordering clinician or representative by the Radiologist Assistant, and communication documented in the PACS or zVision Dashboard. Electronically Signed   By: Bretta Bang III M.D.   On: 08/08/2016 15:38    Anti-infectives: Anti-infectives    None       Assessment/Plan Recurrent SBO in the setting of multiple abdominal surgeries Last admission January 2017 - refused NGT on admission, gastrografin never administered  - KUB yesterday reported continued distended loops of bowel - having BM's and flatus, abdominal pain improving - full liquid diet  today and advance as tolerated. - continue ambulating  Plan: diet advanced. If patient tolerates will plan discharge later today or tomorrow.   LOS: 3 days    Edson SnowballBROOKE A MILLER , [facility] 08/09/2016, 9:58 AM Pager: [phone] Consults: [phone] Mon-Fri 7:00 am-4:30 pm Sat-Sun 7:00 am-11:30 am

## 2016-08-09 NOTE — Discharge Summary (Signed)
Physician Discharge Summary  Patient ID: [name] MRN: [id number] DOB/AGE: 48/12/1965 48 y.o.  Admit date: 08/05/2016 Discharge date: 08/09/2016  Admission Diagnoses:  Discharge Diagnoses:  Active Problems:   SBO (small bowel obstruction) (HCC)   Discharged Condition: good  Hospital Course: Admitted for bowel obstruction which resolved without the SB water soluble protocol or NGT.  Doing well and having bowel movements.  Consults: None  Significant Diagnostic Studies: radiology: KUB: SBO  Treatments: IV hydration  Discharge Exam: Blood pressure 111/71, pulse 60, temperature 98.1 F (36.7 C), temperature source Oral, resp. rate 19, height 6\' 1"  (1.854 m), weight 91.8 kg (202 lb 4.8 oz), SpO2 98 %. General appearance: alert and cooperative GI: soft, non-tender; bowel sounds normal; no masses,  no organomegaly and normal bowel movements  Disposition: 01-Home or Self Care     Medication List    TAKE these medications   cetirizine 10 MG tablet Commonly known as:  ZYRTEC Take 10 mg by mouth daily as needed for allergies.   NEXIUM 40 MG capsule Generic drug:  esomeprazole TAKE ONE CAPSULE BY MOUTH EVERY DAY   omeprazole 40 MG capsule Commonly known as:  PRILOSEC Take 40 mg by mouth daily.   ondansetron 4 MG disintegrating tablet Commonly known as:  ZOFRAN-ODT Take 1 tablet (4 mg total) by mouth every 6 (six) hours as needed for nausea.      Follow-up Information    [facility] [place] .   Specialty:  General Surgery Why:  Follow-up as needed Contact information: [address] [place] [id number] [phone]          Signed: Jimmye NormanJAMES Blessin Kanno 08/09/2016, 4:35 PM

## 2016-08-20 DIAGNOSIS — M5417 Radiculopathy, lumbosacral region: Secondary | ICD-10-CM | POA: Diagnosis not present

## 2016-08-21 ENCOUNTER — Telehealth: Payer: Self-pay | Admitting: Internal Medicine

## 2016-08-21 NOTE — Telephone Encounter (Signed)
Patient with recent admission again for SBO.  According to the patient they have no explanation for it .  [facility] but he wants to see Dr. Leone PayorGessner first.  I moved his appt to 10/06/16 3:30.  He wanted you to be aware of his recent admissions.

## 2016-09-04 DIAGNOSIS — M5417 Radiculopathy, lumbosacral region: Secondary | ICD-10-CM | POA: Diagnosis not present

## 2016-09-09 DIAGNOSIS — G5711 Meralgia paresthetica, right lower limb: Secondary | ICD-10-CM | POA: Diagnosis not present

## 2016-09-09 DIAGNOSIS — M5417 Radiculopathy, lumbosacral region: Secondary | ICD-10-CM | POA: Diagnosis not present

## 2016-09-24 DIAGNOSIS — G5711 Meralgia paresthetica, right lower limb: Secondary | ICD-10-CM | POA: Diagnosis not present

## 2016-09-30 ENCOUNTER — Encounter: Payer: Self-pay | Admitting: Internal Medicine

## 2016-09-30 ENCOUNTER — Ambulatory Visit (INDEPENDENT_AMBULATORY_CARE_PROVIDER_SITE_OTHER): Payer: BLUE CROSS/BLUE SHIELD | Admitting: Internal Medicine

## 2016-09-30 ENCOUNTER — Other Ambulatory Visit (INDEPENDENT_AMBULATORY_CARE_PROVIDER_SITE_OTHER): Payer: BLUE CROSS/BLUE SHIELD

## 2016-09-30 VITALS — BP 102/78 | HR 56 | Temp 98.0°F | Resp 16 | Ht 73.0 in | Wt 207.0 lb

## 2016-09-30 DIAGNOSIS — K56609 Unspecified intestinal obstruction, unspecified as to partial versus complete obstruction: Secondary | ICD-10-CM

## 2016-09-30 DIAGNOSIS — Z8379 Family history of other diseases of the digestive system: Secondary | ICD-10-CM

## 2016-09-30 DIAGNOSIS — K219 Gastro-esophageal reflux disease without esophagitis: Secondary | ICD-10-CM | POA: Diagnosis not present

## 2016-09-30 DIAGNOSIS — Z0000 Encounter for general adult medical examination without abnormal findings: Secondary | ICD-10-CM | POA: Diagnosis not present

## 2016-09-30 DIAGNOSIS — Z1159 Encounter for screening for other viral diseases: Secondary | ICD-10-CM | POA: Diagnosis not present

## 2016-09-30 DIAGNOSIS — R7989 Other specified abnormal findings of blood chemistry: Secondary | ICD-10-CM

## 2016-09-30 DIAGNOSIS — E559 Vitamin D deficiency, unspecified: Secondary | ICD-10-CM | POA: Diagnosis not present

## 2016-09-30 DIAGNOSIS — G5713 Meralgia paresthetica, bilateral lower limbs: Secondary | ICD-10-CM

## 2016-09-30 DIAGNOSIS — J3089 Other allergic rhinitis: Secondary | ICD-10-CM

## 2016-09-30 DIAGNOSIS — R7301 Impaired fasting glucose: Secondary | ICD-10-CM

## 2016-09-30 LAB — CBC WITH DIFFERENTIAL/PLATELET
BASOS ABS: 0.1 10*3/uL (ref 0.0–0.1)
Basophils Relative: 1 % (ref 0.0–3.0)
EOS ABS: 0.1 10*3/uL (ref 0.0–0.7)
Eosinophils Relative: 1.6 % (ref 0.0–5.0)
HCT: 42.7 % (ref 39.0–52.0)
Hemoglobin: 14.7 g/dL (ref 13.0–17.0)
LYMPHS ABS: 2.9 10*3/uL (ref 0.7–4.0)
Lymphocytes Relative: 51.3 % — ABNORMAL HIGH (ref 12.0–46.0)
MCHC: 34.4 g/dL (ref 30.0–36.0)
MCV: 84.1 fl (ref 78.0–100.0)
MONO ABS: 0.4 10*3/uL (ref 0.1–1.0)
MONOS PCT: 7.9 % (ref 3.0–12.0)
NEUTROS PCT: 38.2 % — AB (ref 43.0–77.0)
Neutro Abs: 2.1 10*3/uL (ref 1.4–7.7)
Platelets: 228 10*3/uL (ref 150.0–400.0)
RBC: 5.08 Mil/uL (ref 4.22–5.81)
RDW: 13.3 % (ref 11.5–15.5)
WBC: 5.6 10*3/uL (ref 4.0–10.5)

## 2016-09-30 LAB — TSH: TSH: 1.36 u[IU]/mL (ref 0.35–4.50)

## 2016-09-30 LAB — COMPREHENSIVE METABOLIC PANEL
ALK PHOS: 69 U/L (ref 39–117)
ALT: 40 U/L (ref 0–53)
AST: 19 U/L (ref 0–37)
Albumin: 4.4 g/dL (ref 3.5–5.2)
BILIRUBIN TOTAL: 0.6 mg/dL (ref 0.2–1.2)
BUN: 14 mg/dL (ref 6–23)
CO2: 31 mEq/L (ref 19–32)
Calcium: 9.7 mg/dL (ref 8.4–10.5)
Chloride: 101 mEq/L (ref 96–112)
Creatinine, Ser: 0.95 mg/dL (ref 0.40–1.50)
GFR: 89.63 mL/min (ref >60.00)
GLUCOSE: 117 mg/dL — AB (ref 70–99)
Potassium: 4.2 mEq/L (ref 3.5–5.1)
Sodium: 138 mEq/L (ref 135–145)
TOTAL PROTEIN: 7.3 g/dL (ref 6.0–8.3)

## 2016-09-30 LAB — VITAMIN D 25 HYDROXY (VIT D DEFICIENCY, FRACTURES): VITD: 22.19 ng/mL — ABNORMAL LOW (ref 30.00–100.00)

## 2016-09-30 LAB — LDL CHOLESTEROL, DIRECT: LDL DIRECT: 64 mg/dL

## 2016-09-30 LAB — LIPID PANEL
Cholesterol: 113 mg/dL (ref 0–200)
HDL: 32.1 mg/dL — ABNORMAL LOW (ref >39.00)
NONHDL: 81.26
Total CHOL/HDL Ratio: 4
Triglycerides: 221 mg/dL — ABNORMAL HIGH (ref 0.0–149.0)
VLDL: 44.2 mg/dL — ABNORMAL HIGH (ref 0.0–40.0)

## 2016-09-30 LAB — HEMOGLOBIN A1C: HEMOGLOBIN A1C: 6.6 % — AB (ref 4.6–6.5)

## 2016-09-30 MED ORDER — OMEPRAZOLE 40 MG PO CPDR: 40.0000 mg | DELAYED_RELEASE_CAPSULE | Freq: Every day | ORAL | 3 refills | Status: DC

## 2016-09-30 NOTE — Assessment & Plan Note (Signed)
His father and sister died of hepatitis - he is unsure of his status - he is unsure if he was vaccinated Check hepatitis titers - will vaccinate if needed - will discuss with him

## 2016-09-30 NOTE — Patient Instructions (Addendum)
Test(s) ordered today. Your results will be released to MyChart (or called to you) after review, usually within 72hours after test completion. If any changes need to be made, you will be notified at that same time.  All other Health Maintenance issues reviewed.   All recommended immunizations and age-appropriate screenings are up-to-date or discussed.  No immunizations administered today.   Medications reviewed and updated.   No changes recommended at this time.  Your prescription(s) have been submitted to your pharmacy. Please take as directed and contact our office if you believe you are having problem(s) with the medication(s).  Please followup in one year, sooner if needed   Health Maintenance, Male A healthy lifestyle and preventative care can promote health and wellness.  Maintain regular health, dental, and eye exams.  Eat a healthy diet. Foods like vegetables, fruits, whole grains, low-fat dairy products, and lean protein foods contain the nutrients you need and are low in calories. Decrease your intake of foods high in solid fats, added sugars, and salt. Get information about a proper diet from your health care provider, if necessary.  Regular physical exercise is one of the most important things you can do for your health. Most adults should get at least 150 minutes of moderate-intensity exercise (any activity that increases your heart rate and causes you to sweat) each week. In addition, most adults need muscle-strengthening exercises on 2 or more days a week.   Maintain a healthy weight. The body mass index (BMI) is a screening tool to identify possible weight problems. It provides an estimate of body fat based on height and weight. Your health care provider can find your BMI and can help you achieve or maintain a healthy weight. For males 20 years and older:  A BMI below 18.5 is considered underweight.  A BMI of 18.5 to 24.9 is normal.  A BMI of 25 to 29.9 is considered  overweight.  A BMI of 30 and above is considered obese.  Maintain normal blood lipids and cholesterol by exercising and minimizing your intake of saturated fat. Eat a balanced diet with plenty of fruits and vegetables. Blood tests for lipids and cholesterol should begin at age 48 and be repeated every 5 years. If your lipid or cholesterol levels are high, you are over age 48, or you are at high risk for heart disease, you may need your cholesterol levels checked more frequently.Ongoing high lipid and cholesterol levels should be treated with medicines if diet and exercise are not working.  If you smoke, find out from your health care provider how to quit. If you do not use tobacco, do not start.  Lung cancer screening is recommended for adults aged 55-80 years who are at high risk for developing lung cancer because of a history of smoking. A yearly low-dose CT scan of the lungs is recommended for people who have at least a 30-pack-year history of smoking and are current smokers or have quit within the past 15 years. A pack year of smoking is smoking an average of 1 pack of cigarettes a day for 1 year (for example, a 30-pack-year history of smoking could mean smoking 1 pack a day for 30 years or 2 packs a day for 15 years). Yearly screening should continue until the smoker has stopped smoking for at least 15 years. Yearly screening should be stopped for people who develop a health problem that would prevent them from having lung cancer treatment.  If you choose to drink alcohol, do  not have more than 2 drinks per day. One drink is considered to be 12 oz (360 mL) of beer, 5 oz (150 mL) of wine, or 1.5 oz (45 mL) of liquor.  Avoid the use of street drugs. Do not share needles with anyone. Ask for help if you need support or instructions about stopping the use of drugs.  High blood pressure causes heart disease and increases the risk of stroke. High blood pressure is more likely to develop in:  People  who have blood pressure in the end of the normal range (100-139/85-89 mm Hg).  People who are overweight or obese.  People who are African American.  If you are 64-61 years of age, have your blood pressure checked every 3-5 years. If you are 8 years of age or older, have your blood pressure checked every year. You should have your blood pressure measured twice--once when you are at a hospital or clinic, and once when you are not at a hospital or clinic. Record the average of the two measurements. To check your blood pressure when you are not at a hospital or clinic, you can use:  An automated blood pressure machine at a pharmacy.  A home blood pressure monitor.  If you are 71-32 years old, ask your health care provider if you should take aspirin to prevent heart disease.  Diabetes screening involves taking a blood sample to check your fasting blood sugar level. This should be done once every 3 years after age 10 if you are at a normal weight and without risk factors for diabetes. Testing should be considered at a younger age or be carried out more frequently if you are overweight and have at least 1 risk factor for diabetes.  Colorectal cancer can be detected and often prevented. Most routine colorectal cancer screening begins at the age of 75 and continues through age 20. However, your health care provider may recommend screening at an earlier age if you have risk factors for colon cancer. On a yearly basis, your health care provider may provide home test kits to check for hidden blood in the stool. A small camera at the end of a tube may be used to directly examine the colon (sigmoidoscopy or colonoscopy) to detect the earliest forms of colorectal cancer. Talk to your health care provider about this at age 66 when routine screening begins. A direct exam of the colon should be repeated every 5-10 years through age 52, unless early forms of precancerous polyps or small growths are found.  People  who are at an increased risk for hepatitis B should be screened for this virus. You are considered at high risk for hepatitis B if:  You were born in a country where hepatitis B occurs often. Talk with your health care provider about which countries are considered high risk.  Your parents were born in a high-risk country and you have not received a shot to protect against hepatitis B (hepatitis B vaccine).  You have HIV or AIDS.  You use needles to inject street drugs.  You live with, or have sex with, someone who has hepatitis B.  You are a man who has sex with other men (MSM).  You get hemodialysis treatment.  You take certain medicines for conditions like cancer, organ transplantation, and autoimmune conditions.  Hepatitis C blood testing is recommended for all people born from [birth year] through 1963 and any individual with known risk factors for hepatitis C.  Healthy men should no longer receive  prostate-specific antigen (PSA) blood tests as part of routine cancer screening. Talk to your health care provider about prostate cancer screening.  Testicular cancer screening is not recommended for adolescents or adult males who have no symptoms. Screening includes self-exam, a health care provider exam, and other screening tests. Consult with your health care provider about any symptoms you have or any concerns you have about testicular cancer.  Practice safe sex. Use condoms and avoid high-risk sexual practices to reduce the spread of sexually transmitted infections (STIs).  You should be screened for STIs, including gonorrhea and chlamydia if:  You are sexually active and are younger than 24 years.  You are older than 24 years, and your health care provider tells you that you are at risk for this type of infection.  Your sexual activity has changed since you were last screened, and you are at an increased risk for chlamydia or gonorrhea. Ask your health care provider if you are at  risk.  If you are at risk of being infected with HIV, it is recommended that you take a prescription medicine daily to prevent HIV infection. This is called pre-exposure prophylaxis (PrEP). You are considered at risk if:  You are a man who has sex with other men (MSM).  You are a heterosexual man who is sexually active with multiple partners.  You take drugs by injection.  You are sexually active with a partner who has HIV.  Talk with your health care provider about whether you are at high risk of being infected with HIV. If you choose to begin PrEP, you should first be tested for HIV. You should then be tested every 3 months for as long as you are taking PrEP.  Use sunscreen. Apply sunscreen liberally and repeatedly throughout the day. You should seek shade when your shadow is shorter than you. Protect yourself by wearing long sleeves, pants, a wide-brimmed hat, and sunglasses year round whenever you are outdoors.  Tell your health care provider of new moles or changes in moles, especially if there is a change in shape or color. Also, tell your health care provider if a mole is larger than the size of a pencil eraser.  A one-time screening for abdominal aortic aneurysm (AAA) and surgical repair of large AAAs by ultrasound is recommended for men aged 49-75 years who are current or former smokers.  Stay current with your vaccines (immunizations).   This information is not intended to replace advice given to you by your health care provider. Make sure you discuss any questions you have with your health care provider.   Document Released: 05/22/2008 Document Revised: 12/15/2014 Document Reviewed: 04/21/2011 Elsevier Interactive Patient Education [employer].

## 2016-09-30 NOTE — Assessment & Plan Note (Signed)
Check level 

## 2016-09-30 NOTE — Progress Notes (Signed)
Pre visit review using our clinic review tool, if applicable. No additional management support is needed unless otherwise documented below in the visit note. 

## 2016-09-30 NOTE — Assessment & Plan Note (Signed)
Had a nerve block recently - improved symptoms

## 2016-09-30 NOTE — Assessment & Plan Note (Signed)
GERD controlled Continue daily medication  

## 2016-09-30 NOTE — Progress Notes (Signed)
Subjective:    Patient ID: [name], male    DOB: 1966/07/30, 50 y.o.   MRN: [id number]  HPI He is here to establish with a new pcp.   He is here for a physical exam.   She was in the hospital January and [place].  In 1989 he had open abdomienal surgery for abdominal pain for abdominal pain in [place].  He has had 4 additonal surgeries for scar tidssue.  He has had multiple episodes of partial and toal SBO - twice this past year, but several times prior to that.   He is careful on his diet and what he eats.  He gets   Pain between right hip aand knee.  Last week he had a nerve block in his right gron.  He did have some numbness.  The nerve block helped, bu t he still has mild symptoms.   Medications and allergies reviewed with patient and updated if appropriate.  Patient Active Problem List   Diagnosis Date Noted  . SBO (small bowel obstruction) 01/02/2016  . Partial small bowel obstruction 05/07/2013  . ALLERGIC RHINITIS 02/07/2011  . DYSPNEA 02/07/2011  . CHEST PAIN, PRECORDIAL 02/05/2011  . POSTURAL LIGHTHEADEDNESS 01/16/2011  . OBSTRUCTIVE SLEEP APNEA 10/23/2010  . Cough 08/20/2010  . GERD 03/12/2010  . Irritable bowel syndrome 03/12/2010  . DIARRHEA, RECURRENT 02/05/2010    Current Outpatient Prescriptions on File Prior to Visit  Medication Sig Dispense Refill  . cetirizine (ZYRTEC) 10 MG tablet Take 10 mg by mouth daily as needed for allergies.    Marland Kitchen omeprazole (PRILOSEC) 40 MG capsule Take 40 mg by mouth daily.     No current facility-administered medications on file prior to visit.     Past Medical History:  Diagnosis Date  . Chest wall pain   . Cough   . Diarrhea recurrent  . GERD (gastroesophageal reflux disease)   . Headache(784.0)   . Irritable bowel syndrome   . Obstructive sleep apnea    apnealink 10/23/10 AHI 5, Sp02 low 87%  . Postural lightheadedness   . SBO (small bowel obstruction) 12/2015  . Small bowel obstruction     Past Surgical  History:  Procedure Laterality Date  . CHOLECYSTECTOMY  2009  . COLECTOMY  1994   segmental  . COLONOSCOPY  07/17/2011   hemorrhoids, otherwise normal including terminal ileum and random colon biopises  . GASTROSTOMY  2005   stamm  . LAPAROTOMY  2002, 2005, 2009  . SPHINCTEROTOMY  2009   lateral internal  . UPPER GASTROINTESTINAL ENDOSCOPY  07/17/2011   normal, including duodenal biopsies    Social History   Social History  . Marital status: Married    Spouse name: N/A  . Number of children: N/A  . Years of education: N/A   Social History Main Topics  . Smoking status: Never Smoker  . Smokeless tobacco: Never Used  . Alcohol use No  . Drug use: No  . Sexual activity: Not Asked   Other Topics Concern  . None   Social History Narrative   Occupation: Group Engineer, [employer] (deliveries)   Patient has never smoked.   Alcohol Use-no   Illicit Drug Use-no   Married-Divorced-Married again 2009- wife is in [place] with infant son    Family History  Problem Relation Age of Onset  . Cirrhosis Father     Review of Systems  Constitutional: Positive for fatigue (mild). Negative for appetite change, chills, fever and unexpected weight change.  Eyes: Negative for visual disturbance.  Respiratory: Negative for cough, shortness of breath and wheezing.   Cardiovascular: Negative for chest pain, palpitations and leg swelling.  Gastrointestinal: Positive for constipation. Negative for abdominal pain, blood in stool and nausea.       Gerd controlled  Genitourinary: Negative for dysuria and hematuria.  Musculoskeletal: Positive for arthralgias. Negative for back pain.  Skin: Negative for rash.  Neurological: Negative for light-headedness and headaches.  Psychiatric/Behavioral: Negative for dysphoric mood. The patient is not nervous/anxious.        Objective:   Vitals:   09/30/16 0758  BP: 102/78  Pulse: (!) 56  Resp: 16  Temp: 98 F (36.7 C)   Filed Weights   09/30/16  0758  Weight: 207 lb (93.9 kg)   Body mass index is 27.31 kg/m.   Physical Exam Constitutional: He appears well-developed and well-nourished. No distress.  HENT:  Head: Normocephalic and atraumatic.  Right Ear: External ear normal.  Left Ear: External ear normal.  Mouth/Throat: Oropharynx is clear and moist.  Normal ear canals and TM b/l  Eyes: Conjunctivae and EOM are normal.  Neck: Neck supple. No tracheal deviation present. No thyromegaly present.  No carotid bruit  Cardiovascular: Normal rate, regular rhythm, normal heart sounds and intact distal pulses.   No murmur heard. Pulmonary/Chest: Effort normal and breath sounds normal. No respiratory distress. He has no wheezes. He has no rales.  Abdominal: Soft. Healed scar up center of abdomen. Ventral hernia. He exhibits no distension. There is no tenderness.  Genitourinary:  deferred  Musculoskeletal: He exhibits no edema.  Lymphadenopathy:   He has no cervical adenopathy.  Skin: Skin is warm and dry. He is not diaphoretic.  Psychiatric: He has a normal mood and affect. His behavior is normal.         Assessment & Plan:   Physical exam: Screening blood work  ordered Immunizations - ? Last tdap - will check records Eye exams  Up to date  Exercise - none, stressed regular exercise Weight - advised weight loss - ideal weight 190 Skin  - no concerns Substance abuse - none  See Problem List for Assessment and Plan of chronic medical problems.   F/u annually, sooner if needed or depending on blood work

## 2016-09-30 NOTE — Assessment & Plan Note (Signed)
Has been told he was borderline diabetic Check a1c

## 2016-09-30 NOTE — Assessment & Plan Note (Signed)
Zyrtec prn  

## 2016-10-01 ENCOUNTER — Encounter: Payer: Self-pay | Admitting: Internal Medicine

## 2016-10-01 DIAGNOSIS — E119 Type 2 diabetes mellitus without complications: Secondary | ICD-10-CM | POA: Insufficient documentation

## 2016-10-01 LAB — HEPATITIS C ANTIBODY: HCV Ab: NEGATIVE

## 2016-10-01 LAB — HEPATITIS B SURFACE ANTIGEN: HEP B S AG: NEGATIVE

## 2016-10-01 LAB — HEPATITIS A ANTIBODY, TOTAL: HEP A TOTAL AB: REACTIVE — AB

## 2016-10-01 LAB — HEPATITIS B SURFACE ANTIBODY,QUALITATIVE: HEP B S AB: POSITIVE — AB

## 2016-10-02 ENCOUNTER — Encounter: Payer: Self-pay | Admitting: Emergency Medicine

## 2016-10-06 ENCOUNTER — Encounter: Payer: Self-pay | Admitting: Internal Medicine

## 2016-10-06 ENCOUNTER — Ambulatory Visit (INDEPENDENT_AMBULATORY_CARE_PROVIDER_SITE_OTHER): Payer: BLUE CROSS/BLUE SHIELD | Admitting: Internal Medicine

## 2016-10-06 VITALS — BP 90/52 | HR 76 | Ht 73.0 in | Wt 206.4 lb

## 2016-10-06 DIAGNOSIS — K56609 Unspecified intestinal obstruction, unspecified as to partial versus complete obstruction: Secondary | ICD-10-CM

## 2016-10-06 DIAGNOSIS — R197 Diarrhea, unspecified: Secondary | ICD-10-CM | POA: Diagnosis not present

## 2016-10-06 NOTE — Patient Instructions (Signed)
You have been scheduled for a CT enterography scan of the abdomen and pelvis at [facility] ([address]---this is in the same building as Press [facility]).   You are scheduled on 10/13/16 at 2:30pm.You should arrive at 1:15 for your appointment time for registration. Please follow the written instructions below on the day of your exam:  WARNING: IF YOU ARE ALLERGIC TO IODINE/X-RAY DYE, PLEASE NOTIFY RADIOLOGY IMMEDIATELY AT [phone]! YOU WILL BE GIVEN A 13 HOUR PREMEDICATION PREP.  Do not eat anything 10:30am (4 hours prior to your test)   You may take any medications as prescribed with a small amount of water except for the following: Metformin, Glucophage, Glucovance, Avandamet, Riomet, Fortamet, Actoplus Met, Janumet, Glumetza or Metaglip. The above medications must be held the day of the exam AND 48 hours after the exam.  The purpose of you drinking the oral contrast is to aid in the visualization of your intestinal tract. The contrast solution may cause some diarrhea. Before your exam is started, you will be given a small amount of fluid to drink. Depending on your individual set of symptoms, you may also receive an intravenous injection of x-ray contrast/dye. Plan on being at [facility] for 30 minutes or longer, depending on the type of exam you are having performed.  This test typically takes 30-45 minutes to complete.  If you have any questions regarding your exam or if you need to reschedule, you may call the CT department at [phone] between the hours of 8:00 am and 5:00 pm, Monday-Friday.  ________________________________________________________________________

## 2016-10-06 NOTE — Progress Notes (Signed)
[name] 50 y.o. 02/12/1966 [id number]   Assessment & Plan:   Encounter Diagnoses  Name Primary?  . Small bowel obstruction - recurrent Yes  . Diarrhea, unspecified type     Chronic and recurrent problems. Has never been perfectly clear what primary cause of problems leading to bowel surgery in [place] - and has had problems since. Adhesions likely playing at least some role but will pursue CT-enterography to see if that shows something fixable. Probably not but he was intereetsed in pursuing this to see.   Cc: Jaclynn GuarneriBen Hoxworth, MD   Subjective:   Chief Complaint: bowel obstruction again  HPI 50 yo [place] man with recurrent SBO's and also IBS, ? Motility disturbance of GI tract - not seen in several yrs. Last eval by me was 08/2011 - he did well until 2014 had 1 SBO admit and then 12/2015 and again 07/2016. No clear transition points seen on the CT and xrays. Has had multiple abdominal surgeries.  Having some R thigh pain - dx meralgia paresthetica Recent A1C 6.6% Still has diarrhea and occasional constipation problems.  Feels better after dc but still some fatigue. Worried about recurrent problems. Has been considering a second opinion through some program affiliated with his insurer - ? [facility] to [facility] I think he is telling me. No Known Allergies Outpatient Medications Prior to Visit  Medication Sig Dispense Refill  . cetirizine (ZYRTEC) 10 MG tablet Take 10 mg by mouth daily as needed for allergies.    Marland Kitchen. omeprazole (PRILOSEC) 40 MG capsule Take 1 capsule (40 mg total) by mouth daily. 90 capsule 3   No facility-administered medications prior to visit.    Past Medical History:  Diagnosis Date  . Chest wall pain   . Cough   . Diabetes (HCC)   . Diarrhea recurrent  . GERD (gastroesophageal reflux disease)   . Headache(784.0)   . Irritable bowel syndrome   . Obstructive sleep apnea    apnealink 10/23/10 AHI 5, Sp02 low 87%  .  Postural lightheadedness   . SBO (small bowel obstruction) 12/2015  . Small bowel obstruction    Past Surgical History:  Procedure Laterality Date  . CHOLECYSTECTOMY  2009  . COLECTOMY  1994   segmental  . COLONOSCOPY  07/17/2011   hemorrhoids, otherwise normal including terminal ileum and random colon biopises  . GASTROSTOMY  2005   stamm  . LAPAROTOMY  2002, 2005, 2009  . SPHINCTEROTOMY  2009   lateral internal  . UPPER GASTROINTESTINAL ENDOSCOPY  07/17/2011   normal, including duodenal biopsies   Social History   Social History  . Marital status: Married    Spouse name: N/A  . Number of children: N/A  . Years of education: N/A   Social History Main Topics  . Smoking status: Never Smoker  . Smokeless tobacco: Never Used  . Alcohol use No  . Drug use: No  . Sexual activity: Not Asked   Other Topics Concern  . None   Social History Narrative   Occupation: Group Engineer, [employer] (deliveries)   Patient has never smoked.   Alcohol Use-no   Illicit Drug Use-no   Married-Divorced-Married again 2009- wife is in [place] with infant son      No regular exercise   Family History  Problem Relation Age of Onset  . Liver disease Father     liver cancer???  . Hypertension Mother   . Pancreatic cancer Sister  Review of Systems As above Some fatigue All other egative  Objective:   Physical Exam BP (!) 90/52   Pulse 76   Ht 6\' 1"  (1.854 m)   Wt 206 lb 6.4 oz (93.6 kg)   BMI 27.23 kg/m  NAD Eyes anicteric Abdomen mildly distended and tympanitic, BS+ Appropriate mood and affect Ext no edema, cyanosis, clubbing  Data reviewed CT scan 2017, xrays, hospital notes

## 2016-10-08 DIAGNOSIS — G5711 Meralgia paresthetica, right lower limb: Secondary | ICD-10-CM | POA: Diagnosis not present

## 2016-10-08 DIAGNOSIS — M5417 Radiculopathy, lumbosacral region: Secondary | ICD-10-CM | POA: Diagnosis not present

## 2016-10-13 ENCOUNTER — Ambulatory Visit (INDEPENDENT_AMBULATORY_CARE_PROVIDER_SITE_OTHER)
Admission: RE | Admit: 2016-10-13 | Discharge: 2016-10-13 | Disposition: A | Discharge status: Home / Self Care | Payer: BLUE CROSS/BLUE SHIELD | Source: Ambulatory Visit | Attending: Internal Medicine | Admitting: Internal Medicine

## 2016-10-13 DIAGNOSIS — K56609 Unspecified intestinal obstruction, unspecified as to partial versus complete obstruction: Secondary | ICD-10-CM

## 2016-10-13 MED ORDER — IOPAMIDOL (ISOVUE-300) INJECTION 61%: 100.0000 mL | Freq: Once | INTRAVENOUS | Status: DC | PRN

## 2016-10-13 NOTE — Progress Notes (Signed)
Findings are: Fatty liver - extra fat in liver - seen in diabetics - no other testing needed   Normal intestines though can see that he has less colon after resection he had yrs ago - specifically no obstruction(s)  I am sorry but nothing different to do - because of prior surgeries may get small bowel obstructions off and on.  See me prn

## 2016-10-16 ENCOUNTER — Telehealth: Payer: Self-pay | Admitting: Internal Medicine

## 2016-10-16 NOTE — Telephone Encounter (Signed)
[facility] forward 40 pages to Dr.Burns

## 2016-11-07 ENCOUNTER — Ambulatory Visit: Payer: BLUE CROSS/BLUE SHIELD | Admitting: Internal Medicine

## 2016-11-12 DIAGNOSIS — M5417 Radiculopathy, lumbosacral region: Secondary | ICD-10-CM | POA: Diagnosis not present

## 2016-11-12 DIAGNOSIS — G5711 Meralgia paresthetica, right lower limb: Secondary | ICD-10-CM | POA: Diagnosis not present

## 2016-11-20 DIAGNOSIS — J069 Acute upper respiratory infection, unspecified: Secondary | ICD-10-CM | POA: Diagnosis not present

## 2016-11-20 DIAGNOSIS — R5383 Other fatigue: Secondary | ICD-10-CM | POA: Diagnosis not present

## 2016-11-24 ENCOUNTER — Ambulatory Visit: Payer: BLUE CROSS/BLUE SHIELD | Admitting: Family

## 2016-12-04 ENCOUNTER — Telehealth: Payer: Self-pay | Admitting: *Deleted

## 2016-12-04 ENCOUNTER — Ambulatory Visit (INDEPENDENT_AMBULATORY_CARE_PROVIDER_SITE_OTHER)
Admission: RE | Admit: 2016-12-04 | Discharge: 2016-12-04 | Disposition: A | Discharge status: Home / Self Care | Payer: BLUE CROSS/BLUE SHIELD | Source: Ambulatory Visit | Attending: Internal Medicine | Admitting: Internal Medicine

## 2016-12-04 ENCOUNTER — Other Ambulatory Visit (INDEPENDENT_AMBULATORY_CARE_PROVIDER_SITE_OTHER): Payer: BLUE CROSS/BLUE SHIELD

## 2016-12-04 ENCOUNTER — Encounter: Payer: Self-pay | Admitting: Internal Medicine

## 2016-12-04 ENCOUNTER — Ambulatory Visit (INDEPENDENT_AMBULATORY_CARE_PROVIDER_SITE_OTHER): Payer: BLUE CROSS/BLUE SHIELD | Admitting: Internal Medicine

## 2016-12-04 VITALS — BP 90/68 | HR 70 | Temp 97.7°F | Resp 16 | Ht 73.0 in | Wt 202.0 lb

## 2016-12-04 DIAGNOSIS — K219 Gastro-esophageal reflux disease without esophagitis: Secondary | ICD-10-CM | POA: Diagnosis not present

## 2016-12-04 DIAGNOSIS — Z23 Encounter for immunization: Secondary | ICD-10-CM | POA: Diagnosis not present

## 2016-12-04 DIAGNOSIS — M79674 Pain in right toe(s): Secondary | ICD-10-CM | POA: Diagnosis not present

## 2016-12-04 DIAGNOSIS — E119 Type 2 diabetes mellitus without complications: Secondary | ICD-10-CM

## 2016-12-04 DIAGNOSIS — R6882 Decreased libido: Secondary | ICD-10-CM

## 2016-12-04 DIAGNOSIS — M7989 Other specified soft tissue disorders: Secondary | ICD-10-CM | POA: Diagnosis not present

## 2016-12-04 LAB — COMPREHENSIVE METABOLIC PANEL
ALT: 40 U/L (ref 0–53)
AST: 26 U/L (ref 0–37)
Albumin: 4.6 g/dL (ref 3.5–5.2)
Alkaline Phosphatase: 66 U/L (ref 39–117)
BUN: 9 mg/dL (ref 6–23)
CO2: 34 meq/L — AB (ref 19–32)
Calcium: 10.1 mg/dL (ref 8.4–10.5)
Chloride: 99 mEq/L (ref 96–112)
Creatinine, Ser: 1.06 mg/dL (ref 0.40–1.50)
GFR: 78.93 mL/min (ref >60.00)
GLUCOSE: 114 mg/dL — AB (ref 70–99)
POTASSIUM: 4.3 meq/L (ref 3.5–5.1)
Sodium: 137 mEq/L (ref 135–145)
Total Bilirubin: 1.1 mg/dL (ref 0.2–1.2)
Total Protein: 7.8 g/dL (ref 6.0–8.3)

## 2016-12-04 LAB — MICROALBUMIN / CREATININE URINE RATIO
Creatinine,U: 236.7 mg/dL
MICROALB/CREAT RATIO: 0.5 mg/g (ref 0.0–30.0)
Microalb, Ur: 1.2 mg/dL (ref 0.0–1.9)

## 2016-12-04 LAB — LIPID PANEL
CHOL/HDL RATIO: 4
Cholesterol: 127 mg/dL (ref 0–200)
HDL: 34.5 mg/dL — AB (ref >39.00)
LDL CALC: 63 mg/dL (ref 0–99)
NonHDL: 92.62
TRIGLYCERIDES: 149 mg/dL (ref 0.0–149.0)
VLDL: 29.8 mg/dL (ref 0.0–40.0)

## 2016-12-04 LAB — HEMOGLOBIN A1C: Hgb A1c MFr Bld: 6.4 % (ref 4.6–6.5)

## 2016-12-04 MED ORDER — SILDENAFIL CITRATE 20 MG PO TABS: ORAL_TABLET | ORAL | 5 refills | Status: DC

## 2016-12-04 NOTE — Telephone Encounter (Signed)
If that prescription was not covered-none will be covered. Most of these medications are not covered by insurance.

## 2016-12-04 NOTE — Assessment & Plan Note (Signed)
Has decreased libido and ED Has used viagra in the past and it worked - he would like to try it again Will prescribe Will also check testosterone level

## 2016-12-04 NOTE — Patient Instructions (Addendum)
Your fasting sugars should be between 70-99 - normal.  If your sugars 100-125 that is the prediabetic. If your sugars are more than 126 it is in the diabetic range.     Test(s) ordered today. Your results will be released to MyChart (or called to you) after review, usually within 72hours after test completion. If any changes need to be made, you will be notified at that same time.  All other Health Maintenance issues reviewed.   All recommended immunizations and age-appropriate screenings are up-to-date or discussed.  pneumonia immunization administered today.   Medications reviewed and updated.  Changes include trying generic viagra.  Your prescription(s) have been given to you. Please take as directed and contact our office if you believe you are having problem(s) with the medication(s).   Please followup in 6 months for diabetes

## 2016-12-04 NOTE — Assessment & Plan Note (Signed)
Controlled Will try taking medication every other day Work on weight loss

## 2016-12-04 NOTE — Progress Notes (Signed)
Pre visit review using our clinic review tool, if applicable. No additional management support is needed unless otherwise documented below in the visit note. 

## 2016-12-04 NOTE — Telephone Encounter (Signed)
Rec'd call pt states the Sildenafil is not covered by insurance. He is wanting MD to rx something else...Raechel Chute/lmb

## 2016-12-04 NOTE — Assessment & Plan Note (Signed)
Stubbed his toe last week  Still painful, no bruising/swelling Xray today

## 2016-12-04 NOTE — Progress Notes (Signed)
Subjective:    Patient ID: [name], male    DOB: 05/10/1966, 50 y.o.   MRN: [id number]  HPI The patient is here for follow up.  Diabetes: This is a new diagnosis on 09/30/16, his a1c was 6.6%.  He has not been started on any medication.  He is compliant with a diabetic diet - he decreased his sugar intake.  He no longer drinks soda.  He has decreased his breads.  He is eating whole wheat bread instead of white.  He has lost 5 lbs since he was here last. He is not exercising regularly, But knows he needs to start.  He is up-to-date with an ophthalmology examination.   Decreased libido:  He has had decreased libido for a while. He has no desire and has poor erections. He has used viagra in the past and it worked well.  He would like to have a prescription.  He tripped last week and stubbed his right toe.  It is painful.  It did not swell or become bruised.    Sometimes he has something coming out of his left ear.  He denies pain.   GERD:  He is taking his medication daily as prescribed.  He denies any GERD symptoms and feels his GERD is well controlled.    Medications and allergies reviewed with patient and updated if appropriate.  Patient Active Problem List   Diagnosis Date Noted  . Diabetes (HCC) 10/01/2016  . Family history of hepatitis 09/30/2016  . Elevated fasting blood sugar 01/25/2016  . Meralgia paresthetica of both lower extremities 01/25/2016  . Vitamin D deficiency 01/25/2016  . SBO (small bowel obstruction) 01/02/2016  . Allergic rhinitis 02/07/2011  . OBSTRUCTIVE SLEEP APNEA 10/23/2010  . GERD 03/12/2010  . Irritable bowel syndrome 03/12/2010  . DIARRHEA, RECURRENT 02/05/2010    Current Outpatient Prescriptions on File Prior to Visit  Medication Sig Dispense Refill  . cetirizine (ZYRTEC) 10 MG tablet Take 10 mg by mouth daily as needed for allergies.    Marland Kitchen. omeprazole (PRILOSEC) 40 MG capsule Take 1 capsule (40 mg total) by mouth daily. 90 capsule 3   No  current facility-administered medications on file prior to visit.     Past Medical History:  Diagnosis Date  . Chest wall pain   . Cough   . Diabetes (HCC)   . Diarrhea recurrent  . GERD (gastroesophageal reflux disease)   . Headache(784.0)   . Irritable bowel syndrome   . Obstructive sleep apnea    apnealink 10/23/10 AHI 5, Sp02 low 87%  . Postural lightheadedness   . SBO (small bowel obstruction) 12/2015  . Small bowel obstruction     Past Surgical History:  Procedure Laterality Date  . CHOLECYSTECTOMY  2009  . COLECTOMY  1994   segmental  . COLONOSCOPY  07/17/2011   hemorrhoids, otherwise normal including terminal ileum and random colon biopises  . GASTROSTOMY  2005   stamm  . LAPAROTOMY  2002, 2005, 2009  . SPHINCTEROTOMY  2009   lateral internal  . UPPER GASTROINTESTINAL ENDOSCOPY  07/17/2011   normal, including duodenal biopsies    Social History   Social History  . Marital status: Married    Spouse name: N/A  . Number of children: N/A  . Years of education: N/A   Social History Main Topics  . Smoking status: Never Smoker  . Smokeless tobacco: Never Used  . Alcohol use No  . Drug use: No  . Sexual activity:  Not on file   Other Topics Concern  . Not on file   Social History Narrative   Occupation: Group Engineer, [employer] (deliveries)   Patient has never smoked.   Alcohol Use-no   Illicit Drug Use-no   Married-Divorced-Married again 2009-    3 children 2 sons and 1 daughter      No regular exercise    Family History  Problem Relation Age of Onset  . Liver disease Father     liver cancer???  . Hypertension Mother   . Pancreatic cancer Sister     Review of Systems  Constitutional: Negative for chills and fever.  Respiratory: Negative for cough, shortness of breath and wheezing.   Cardiovascular: Negative for chest pain, palpitations and leg swelling.  Neurological: Positive for headaches (occasional). Negative for light-headedness.        Objective:   Vitals:   12/04/16 0940  BP: 90/68  Pulse: 70  Resp: 16  Temp: 97.7 F (36.5 C)   Filed Weights   12/04/16 0940  Weight: 202 lb (91.6 kg)   Body mass index is 26.65 kg/m.   Wt Readings from Last 3 Encounters:  12/04/16 202 lb (91.6 kg)  10/06/16 206 lb 6.4 oz (93.6 kg)  09/30/16 207 lb (93.9 kg)      Physical Exam    Constitutional: Appears well-developed and well-nourished. No distress.  HENT:  Head: Normocephalic and atraumatic.  Neck: Neck supple. No tracheal deviation present. No thyromegaly present.  No cervical lymphadenopathy Cardiovascular: Normal rate, regular rhythm and normal heart sounds.   No murmur heard. No carotid bruit .  No edema Pulmonary/Chest: Effort normal and breath sounds normal. No respiratory distress. No has no wheezes. No rales.  Msk: right foot without deformity,  Right second toe with tenderness with palpation, no swelling or bruising, increased pain in toe with movement Skin: Skin is warm and dry. Not diaphoretic.  Psychiatric: Normal mood and affect. Behavior is normal.      Assessment & Plan:    See Problem List for Assessment and Plan of chronic medical problems.

## 2016-12-04 NOTE — Assessment & Plan Note (Signed)
Will try to control with diet - he has made significant changes and has lost 5 lbs He has not started exercising - stressed the importance of regular exercise Check a1c, urine micro Follow up in 6 months

## 2016-12-05 NOTE — Telephone Encounter (Signed)
LVM informing pt, informed pt he could contact [facility] in [place] to see what they charge for this drug.

## 2016-12-06 LAB — TESTOSTERONE, FREE, TOTAL, SHBG
Sex Hormone Binding: 52.3 nmol/L (ref 16.5–55.9)
TESTOSTERONE: 681 ng/dL (ref 264–916)
Testosterone, Free: 14.2 pg/mL (ref 6.8–21.5)

## 2016-12-12 ENCOUNTER — Encounter: Payer: Self-pay | Admitting: Emergency Medicine

## 2017-02-03 ENCOUNTER — Encounter: Payer: Self-pay | Admitting: Family

## 2017-02-03 ENCOUNTER — Ambulatory Visit (INDEPENDENT_AMBULATORY_CARE_PROVIDER_SITE_OTHER): Payer: BLUE CROSS/BLUE SHIELD | Admitting: Family

## 2017-02-03 VITALS — BP 112/68 | HR 68 | Temp 98.1°F | Resp 16 | Ht 73.0 in | Wt 206.0 lb

## 2017-02-03 DIAGNOSIS — J069 Acute upper respiratory infection, unspecified: Secondary | ICD-10-CM | POA: Diagnosis not present

## 2017-02-03 NOTE — Patient Instructions (Signed)
Thank you for choosing [facility].  SUMMARY AND INSTRUCTIONS:  It appears as though you have an upper respiratory infection.  Your symptoms should begin to improve over the next 2-3 days.  Drink plenty of water.  Aleve-D 2x daily for the next 2-3 days.  Medication:  Your prescription(s) have been submitted to your pharmacy or been printed and provided for you. Please take as directed and contact our office if you believe you are having problem(s) with the medication(s) or have any questions.  Follow up:  If your symptoms worsen or fail to improve, please contact our office for further instruction, or in case of emergency go directly to the emergency room at the closest medical facility.   General Recommendations:    Please drink plenty of fluids.  Get plenty of rest   Sleep in humidified air  Use saline nasal sprays  Netti pot   OTC Medications:  Decongestants - helps relieve congestion   Flonase (generic fluticasone) or Nasacort (generic triamcinolone) - please make sure to use the "cross-over" technique at a 45 degree angle towards the opposite eye as opposed to straight up the nasal passageway.   Sudafed (generic pseudoephedrine - Note this is the one that is available behind the pharmacy counter); Products with phenylephrine (-PE) may also be used but is often not as effective as pseudoephedrine.   If you have HIGH BLOOD PRESSURE - Coricidin HBP; AVOID any product that is -D as this contains pseudoephedrine which may increase your blood pressure.  Afrin (oxymetazoline) every 6-8 hours for up to 3 days.   Allergies - helps relieve runny nose, itchy eyes and sneezing   Claritin (generic loratidine), Allegra (fexofenidine), or Zyrtec (generic cyrterizine) for runny nose. These medications should not cause drowsiness.  Note - Benadryl (generic diphenhydramine) may be used however may cause drowsiness  Cough -   Delsym or Robitussin (generic  dextromethorphan)  Expectorants - helps loosen mucus to ease removal   Mucinex (generic guaifenesin) as directed on the package.  Headaches / General Aches   Tylenol (generic acetaminophen) - DO NOT EXCEED 3 grams (3,000 mg) in a 24 hour time period  Advil/Motrin (generic ibuprofen)   Sore Throat -   Salt water gargle   Chloraseptic (generic benzocaine) spray or lozenges / Sucrets (generic dyclonine)     Upper Respiratory Infection, Adult Most upper respiratory infections (URIs) are caused by a virus. A URI affects the nose, throat, and upper air passages. The most common type of URI is often called "the common cold." Follow these instructions at home:  Take medicines only as told by your doctor.  Gargle warm saltwater or take cough drops to comfort your throat as told by your doctor.  Use a warm mist humidifier or inhale steam from a shower to increase air moisture. This may make it easier to breathe.  Drink enough fluid to keep your pee (urine) clear or pale yellow.  Eat soups and other clear broths.  Have a healthy diet.  Rest as needed.  Go back to work when your fever is gone or your doctor says it is okay.  You may need to stay home longer to avoid giving your URI to others.  You can also wear a face mask and wash your hands often to prevent spread of the virus.  Use your inhaler more if you have asthma.  Do not use any tobacco products, including cigarettes, chewing tobacco, or electronic cigarettes. If you need help quitting, ask your doctor.  Contact a doctor if:  You are getting worse, not better.  Your symptoms are not helped by medicine.  You have chills.  You are getting more short of breath.  You have brown or red mucus.  You have yellow or brown discharge from your nose.  You have pain in your face, especially when you bend forward.  You have a fever.  You have puffy (swollen) neck glands.  You have pain while swallowing.  You have  white areas in the back of your throat. Get help right away if:  You have very bad or constant:  Headache.  Ear pain.  Pain in your forehead, behind your eyes, and over your cheekbones (sinus pain).  Chest pain.  You have long-lasting (chronic) lung disease and any of the following:  Wheezing.  Long-lasting cough.  Coughing up blood.  A change in your usual mucus.  You have a stiff neck.  You have changes in your:  Vision.  Hearing.  Thinking.  Mood. This information is not intended to replace advice given to you by your health care provider. Make sure you discuss any questions you have with your health care provider. Document Released: 05/12/2008 Document Revised: 07/27/2016 Document Reviewed: 03/01/2014 Elsevier Interactive Patient Education  2017 [employer]

## 2017-02-03 NOTE — Assessment & Plan Note (Signed)
Symptoms and exam consistent with acute respiratory infection most likely viral. Recommend over-the-counter medications as needed for symptom relief and supportive care. Follow-up if symptoms worsen or do not improve.

## 2017-02-03 NOTE — Progress Notes (Signed)
Subjective:    Patient ID: [name], male    DOB: 01/04/1966, 51 y.o.   MRN: [id number]  Chief Complaint  Patient presents with  . Cough    cough and congestion, did lose his voice on saturday, x3 days    HPI:  [name] is a 51 y.o. male who  has a past medical history of Chest wall pain; Cough; Diabetes (HCC); Diarrhea (recurrent); GERD (gastroesophageal reflux disease); Headache(784.0); Irritable bowel syndrome; Obstructive sleep apnea; Postural lightheadedness; SBO (small bowel obstruction) (12/2015); and Small bowel obstruction. and presents today for an acute office visit.  This is a new problem. Associated symptoms of cough, sore throat and congestion with initial loss of voice has been going on for about 3 days. No fevers. Modifying factors include ibuprofen which helped a little bit. Timing of the cough is worse at night. He has been seen at work with concern for possible one enlarged tonsil by his work [employer]. Course of the symptoms appears to be worsening. No recent antibiotics.   No Known Allergies   Outpatient Medications Prior to Visit  Medication Sig Dispense Refill  . cetirizine (ZYRTEC) 10 MG tablet Take 10 mg by mouth daily as needed for allergies.    Marland Kitchen. omeprazole (PRILOSEC) 40 MG capsule Take 1 capsule (40 mg total) by mouth daily. 90 capsule 3  . sildenafil (REVATIO) 20 MG tablet Take 3-5 pills daily as neede 50 tablet 5   No facility-administered medications prior to visit.      Review of Systems  Constitutional: Negative for chills and fever.  HENT: Positive for congestion and sore throat. Negative for sinus pain and sinus pressure.   Respiratory: Positive for cough. Negative for chest tightness, shortness of breath and wheezing.   Neurological: Negative for headaches.      Objective:    BP 112/68 (BP Location: Left Arm, Patient Position: Sitting, Cuff Size: Large)   Pulse 68   Temp 98.1 F (36.7 C) (Oral)   Resp 16   Ht 6\' 1"  (1.854 m)   Wt  206 lb (93.4 kg)   SpO2 98%   BMI 27.18 kg/m  Nursing note and vital signs reviewed.  Physical Exam  Constitutional: He is oriented to person, place, and time. He appears well-developed and well-nourished. No distress.  HENT:  Right Ear: Hearing, tympanic membrane, external ear and ear canal normal.  Left Ear: Hearing, tympanic membrane, external ear and ear canal normal.  Nose: Nose normal.  Mouth/Throat: Uvula is midline, oropharynx is clear and moist and mucous membranes are normal.  Cardiovascular: Normal rate, regular rhythm, normal heart sounds and intact distal pulses.   Pulmonary/Chest: Effort normal and breath sounds normal.  Neurological: He is alert and oriented to person, place, and time.  Skin: Skin is warm and dry.  Psychiatric: He has a normal mood and affect. His behavior is normal. Judgment and thought content normal.       Assessment & Plan:   Problem List Items Addressed This Visit      Respiratory   Acute upper respiratory infection - Primary    Symptoms and exam consistent with acute respiratory infection most likely viral. Recommend over-the-counter medications as needed for symptom relief and supportive care. Follow-up if symptoms worsen or do not improve.          I am having [name] maintain his cetirizine, omeprazole, and sildenafil.   Follow-up: Return if symptoms worsen or fail to improve.  Jeanine Luzalone, Bayler Gehrig, FNP

## 2017-05-30 NOTE — Progress Notes (Signed)
Subjective:    Patient ID: [name], male    DOB: 01/07/1966, 51 y.o.   MRN: [id number]  HPI The patient is here for follow up.  Diabetes: He is controlling his sugars with diet. He is compliant with a diabetic diet. He is active with work, but not exercising regularly.  He checks his feet daily and denies foot lesions. He is up-to-date with an ophthalmology examination.   GERD:  He is taking his medication daily as prescribed.  He denies any GERD symptoms and feels his GERD is well controlled.  He does have a sore throat.  When questioned further he states he thinks his GERD is controlled, but maybe it is not.   Left chest pain:  Since last week he has had sharp pain in his left chest that is intermittent.  He can feel it a little now.  He thought it was gas.  It occurred when he was at rest and active.  He can feel it now.  Nothing makes it worse or better.     Fatigue:  He has felt fatigued for the past month or so.  He sleeps about 5-5 1/2 hours most nights.  He has a history of OSA and was using a dental device but has not used it in years.    Medications and allergies reviewed with patient and updated if appropriate.  Patient Active Problem List   Diagnosis Date Noted  . Toe pain, right 12/04/2016  . Decreased libido 12/04/2016  . Diabetes (HCC) 10/01/2016  . Family history of hepatitis 09/30/2016  . Meralgia paresthetica of right side 01/25/2016  . Vitamin D deficiency 01/25/2016  . SBO (small bowel obstruction) (HCC) 01/02/2016  . Allergic rhinitis 02/07/2011  . OBSTRUCTIVE SLEEP APNEA 10/23/2010  . GERD 03/12/2010  . Irritable bowel syndrome 03/12/2010  . DIARRHEA, RECURRENT 02/05/2010    Current Outpatient Prescriptions on File Prior to Visit  Medication Sig Dispense Refill  . cetirizine (ZYRTEC) 10 MG tablet Take 10 mg by mouth daily as needed for allergies.    Marland Kitchen omeprazole (PRILOSEC) 40 MG capsule Take 1 capsule (40 mg total) by mouth daily. 90 capsule 3  .  sildenafil (REVATIO) 20 MG tablet Take 3-5 pills daily as neede 50 tablet 5   No current facility-administered medications on file prior to visit.     Past Medical History:  Diagnosis Date  . Chest wall pain   . Cough   . Diabetes (HCC)   . Diarrhea recurrent  . GERD (gastroesophageal reflux disease)   . Headache(784.0)   . Irritable bowel syndrome   . Obstructive sleep apnea    apnealink 10/23/10 AHI 5, Sp02 low 87%  . Postural lightheadedness   . SBO (small bowel obstruction) 12/2015  . Small bowel obstruction     Past Surgical History:  Procedure Laterality Date  . CHOLECYSTECTOMY  2009  . COLECTOMY  1994   segmental  . COLONOSCOPY  07/17/2011   hemorrhoids, otherwise normal including terminal ileum and random colon biopises  . GASTROSTOMY  2005   stamm  . LAPAROTOMY  2002, 2005, 2009  . SPHINCTEROTOMY  2009   lateral internal  . UPPER GASTROINTESTINAL ENDOSCOPY  07/17/2011   normal, including duodenal biopsies    Social History   Social History  . Marital status: Married    Spouse name: N/A  . Number of children: N/A  . Years of education: N/A   Social History Main Topics  . Smoking status:  Never Smoker  . Smokeless tobacco: Never Used  . Alcohol use No  . Drug use: No  . Sexual activity: Not on file   Other Topics Concern  . Not on file   Social History Narrative   Occupation: Group Engineer, [employer] (deliveries)   Patient has never smoked.   Alcohol Use-no   Illicit Drug Use-no   Married-Divorced-Married again 2009-    3 children 2 sons and 1 daughter      No regular exercise    Family History  Problem Relation Age of Onset  . Liver disease Father        liver cancer???  . Hypertension Mother   . Pancreatic cancer Sister     Review of Systems  Constitutional: Positive for fatigue. Negative for chills and fever.  HENT: Positive for sore throat.   Respiratory: Negative for cough, shortness of breath and wheezing.   Cardiovascular:  Positive for chest pain. Negative for palpitations and leg swelling.  Gastrointestinal: Positive for constipation and diarrhea (alternating constipation and diarrhea). Negative for abdominal pain.       GERD controlled  Musculoskeletal: Positive for arthralgias (toes, hips, hands), neck pain and neck stiffness. Negative for joint swelling.  Neurological: Positive for light-headedness (occasional). Negative for headaches.       Objective:   Vitals:   06/01/17 1106  BP: 100/74  Pulse: 76  Resp: 16  Temp: 98.2 F (36.8 C)   Wt Readings from Last 3 Encounters:  06/01/17 210 lb (95.3 kg)  02/03/17 206 lb (93.4 kg)  12/04/16 202 lb (91.6 kg)   Body mass index is 27.71 kg/m.   Physical Exam    Constitutional: Appears well-developed and well-nourished. No distress.  HENT:  Head: Normocephalic and atraumatic.  Neck: Neck supple. No tracheal deviation present. No thyromegaly present.  No cervical lymphadenopathy Cardiovascular: Normal rate, regular rhythm and normal heart sounds.   No murmur heard. No carotid bruit .  No edema Pulmonary/Chest: Chest wall non-tender; Effort normal and breath sounds normal. No respiratory distress. No has no wheezes. No rales.  Skin: Skin is warm and dry. Not diaphoretic.  Psychiatric: Normal mood and affect. Behavior is normal.      Assessment & Plan:    See Problem List for Assessment and Plan of chronic medical problems.

## 2017-05-30 NOTE — Patient Instructions (Addendum)
  Test(s) ordered today. Your results will be released to MyChart (or called to you) after review, usually within 72hours after test completion. If any changes need to be made, you will be notified at that same time.  An EKG was done today.   Medications reviewed and updated.  Changes include increasing the omeprazole to twice a day - take before your meals.    A referral was ordered for pulmonary to reassess your sleep apnea.   Please followup in 6 months, sooner if your symptoms do not improve

## 2017-06-01 ENCOUNTER — Ambulatory Visit (INDEPENDENT_AMBULATORY_CARE_PROVIDER_SITE_OTHER): Payer: BLUE CROSS/BLUE SHIELD | Admitting: Internal Medicine

## 2017-06-01 ENCOUNTER — Encounter: Payer: Self-pay | Admitting: Internal Medicine

## 2017-06-01 ENCOUNTER — Other Ambulatory Visit (INDEPENDENT_AMBULATORY_CARE_PROVIDER_SITE_OTHER): Payer: BLUE CROSS/BLUE SHIELD

## 2017-06-01 VITALS — BP 100/74 | HR 76 | Temp 98.2°F | Resp 16 | Wt 210.0 lb

## 2017-06-01 DIAGNOSIS — R079 Chest pain, unspecified: Secondary | ICD-10-CM | POA: Insufficient documentation

## 2017-06-01 DIAGNOSIS — R5383 Other fatigue: Secondary | ICD-10-CM

## 2017-06-01 DIAGNOSIS — E119 Type 2 diabetes mellitus without complications: Secondary | ICD-10-CM

## 2017-06-01 DIAGNOSIS — M2550 Pain in unspecified joint: Secondary | ICD-10-CM | POA: Diagnosis not present

## 2017-06-01 DIAGNOSIS — K219 Gastro-esophageal reflux disease without esophagitis: Secondary | ICD-10-CM

## 2017-06-01 DIAGNOSIS — R0789 Other chest pain: Secondary | ICD-10-CM | POA: Diagnosis not present

## 2017-06-01 DIAGNOSIS — G4733 Obstructive sleep apnea (adult) (pediatric): Secondary | ICD-10-CM

## 2017-06-01 LAB — CBC WITH DIFFERENTIAL/PLATELET
BASOS PCT: 1.2 % (ref 0.0–3.0)
Basophils Absolute: 0.1 10*3/uL (ref 0.0–0.1)
EOS ABS: 0.1 10*3/uL (ref 0.0–0.7)
EOS PCT: 2.3 % (ref 0.0–5.0)
HEMATOCRIT: 43 % (ref 39.0–52.0)
Hemoglobin: 14.9 g/dL (ref 13.0–17.0)
Lymphs Abs: 3.1 10*3/uL (ref 0.7–4.0)
MCHC: 34.7 g/dL (ref 30.0–36.0)
MCV: 83.1 fl (ref 78.0–100.0)
MONO ABS: 0.4 10*3/uL (ref 0.1–1.0)
Monocytes Relative: 7.7 % (ref 3.0–12.0)
NEUTROS ABS: 1.9 10*3/uL (ref 1.4–7.7)
Neutrophils Relative %: 33.7 % — ABNORMAL LOW (ref 43.0–77.0)
Platelets: 231 10*3/uL (ref 150.0–400.0)
RBC: 5.18 Mil/uL (ref 4.22–5.81)
RDW: 13.1 % (ref 11.5–15.5)
WBC: 5.7 10*3/uL (ref 4.0–10.5)

## 2017-06-01 LAB — C-REACTIVE PROTEIN: CRP: 0.2 mg/dL — AB (ref 0.5–20.0)

## 2017-06-01 LAB — SEDIMENTATION RATE: Sed Rate: 15 mm/hr (ref 0–15)

## 2017-06-01 LAB — COMPREHENSIVE METABOLIC PANEL
ALK PHOS: 61 U/L (ref 39–117)
ALT: 34 U/L (ref 0–53)
AST: 23 U/L (ref 0–37)
Albumin: 4.3 g/dL (ref 3.5–5.2)
BUN: 10 mg/dL (ref 6–23)
CHLORIDE: 100 meq/L (ref 96–112)
CO2: 31 meq/L (ref 19–32)
Calcium: 10.1 mg/dL (ref 8.4–10.5)
Creatinine, Ser: 1.01 mg/dL (ref 0.40–1.50)
GFR: 83.28 mL/min (ref >60.00)
GLUCOSE: 114 mg/dL — AB (ref 70–99)
POTASSIUM: 4.3 meq/L (ref 3.5–5.1)
SODIUM: 136 meq/L (ref 135–145)
TOTAL PROTEIN: 7.5 g/dL (ref 6.0–8.3)
Total Bilirubin: 0.8 mg/dL (ref 0.2–1.2)

## 2017-06-01 LAB — TSH: TSH: 0.46 u[IU]/mL (ref 0.35–4.50)

## 2017-06-01 LAB — HEMOGLOBIN A1C: Hgb A1c MFr Bld: 6.6 % — ABNORMAL HIGH (ref 4.6–6.5)

## 2017-06-01 MED ORDER — OMEPRAZOLE 40 MG PO CPDR: 40.0000 mg | DELAYED_RELEASE_CAPSULE | Freq: Two times a day (BID) | ORAL | 3 refills | Status: DC

## 2017-06-01 NOTE — Assessment & Plan Note (Signed)
?  OA or autoimmune in nature Check esr, crp, RF and ANA

## 2017-06-01 NOTE — Assessment & Plan Note (Signed)
Fatigue over the past month Check labs Not getting enough sleep - stressed trying to increase his overall sleep OSA may be contributing - will refer to pulm for -re-evaluation

## 2017-06-01 NOTE — Assessment & Plan Note (Addendum)
Left sided chest pain, intermittent for days - nothing making it better / worse; no associated symptoms Sounds atypical for cardiac and GI EKG today - sinus bradycardia at 55 bpm, RBBB, atypical  T-waves; no significant change compared to last EKG from 2014 Check labs Depending on results will pursue further evaluation

## 2017-06-01 NOTE — Assessment & Plan Note (Signed)
Diet controlled Check a1c Start regular exercise

## 2017-06-01 NOTE — Assessment & Plan Note (Signed)
?   Truly controlled Increase omeprazole to BID to see if chest pain and sore throat improve - if not will consider decreasing back to once daily

## 2017-06-01 NOTE — Assessment & Plan Note (Signed)
H/o mild OSA in 2012 - was intolerant to cpap and was using a dental appliance, but has not used it in years ? Contributing to fatigue - will refer back to pulmonary for re-evaluation Also stressed increasing sleep

## 2017-06-02 LAB — ANA: ANA: NEGATIVE

## 2017-06-03 LAB — RHEUMATOID FACTOR: Rhuematoid fact SerPl-aCnc: <14 IU/mL (ref <14)

## 2017-06-04 ENCOUNTER — Telehealth: Payer: Self-pay | Admitting: Internal Medicine

## 2017-06-04 NOTE — Telephone Encounter (Signed)
Spoke with pt to give lab results. Please see lab result note.

## 2017-06-04 NOTE — Telephone Encounter (Signed)
Patient is calling about lab results, he wants to know what they are. Can you please advise or follow up. He wants to know if he can go to work tomorrow. Thank you.

## 2017-06-08 DIAGNOSIS — R739 Hyperglycemia, unspecified: Secondary | ICD-10-CM | POA: Diagnosis not present

## 2017-06-08 LAB — HM DIABETES EYE EXAM

## 2017-07-17 DIAGNOSIS — H00024 Hordeolum internum left upper eyelid: Secondary | ICD-10-CM | POA: Diagnosis not present

## 2017-07-21 ENCOUNTER — Encounter: Payer: Self-pay | Admitting: Internal Medicine

## 2017-07-31 ENCOUNTER — Telehealth: Payer: Self-pay | Admitting: Internal Medicine

## 2017-07-31 NOTE — Telephone Encounter (Signed)
Patient called stating he had received a letter from [facility] that Dr. [name] NPI was not registered properly.  I assured patient that Dr. [name] is registered here in [place].  Patient states on his last CPE he received a bill from [place] stating the same.  Patient states he finally stated that he did not pay after going round and round with the company.

## 2017-09-09 ENCOUNTER — Ambulatory Visit (INDEPENDENT_AMBULATORY_CARE_PROVIDER_SITE_OTHER): Payer: BLUE CROSS/BLUE SHIELD | Admitting: Pulmonary Disease

## 2017-09-09 ENCOUNTER — Encounter: Payer: Self-pay | Admitting: Pulmonary Disease

## 2017-09-09 VITALS — BP 122/80 | HR 77 | Ht 73.0 in | Wt 208.4 lb

## 2017-09-09 DIAGNOSIS — G4726 Circadian rhythm sleep disorder, shift work type: Secondary | ICD-10-CM

## 2017-09-09 DIAGNOSIS — G4733 Obstructive sleep apnea (adult) (pediatric): Secondary | ICD-10-CM | POA: Diagnosis not present

## 2017-09-09 NOTE — Progress Notes (Signed)
Past Surgical History He  has a past surgical history that includes Colectomy (1994); laparotomy (2002, 2005, 2009); Gastrostomy (2005); Cholecystectomy (2009); Sphincterotomy (2009); Colonoscopy (07/17/2011); and Upper gastrointestinal endoscopy (07/17/2011).  No Known Allergies  Family History His family history includes Hypertension in his mother; Liver disease in his father; Pancreatic cancer in his sister.  Social History He  reports that he has never smoked. He has never used smokeless tobacco. He reports that he does not drink alcohol or use drugs.  Review of systems Constitutional: Negative for fever and unexpected weight change.  HENT: Positive for sore throat. Negative for congestion, dental problem, ear pain, nosebleeds, postnasal drip, rhinorrhea, sinus pressure, sneezing and trouble swallowing.   Eyes: Negative for redness and itching.  Respiratory: Negative for cough, chest tightness, shortness of breath and wheezing.   Cardiovascular: Positive for leg swelling. Negative for palpitations.  Gastrointestinal: Negative for nausea and vomiting.  Genitourinary: Negative for dysuria.  Musculoskeletal: Negative for joint swelling.  Skin: Negative for rash.  Allergic/Immunologic: Negative.  Negative for environmental allergies, food allergies and immunocompromised state.  Neurological: Negative for headaches.  Hematological: Does not bruise/bleed easily.  Psychiatric/Behavioral: Negative for dysphoric mood. The patient is not nervous/anxious.     Current Outpatient Prescriptions on File Prior to Visit  Medication Sig  . cetirizine (ZYRTEC) 10 MG tablet Take 10 mg by mouth daily as needed for allergies.  Marland Kitchen omeprazole (PRILOSEC) 40 MG capsule Take 1 capsule (40 mg total) by mouth 2 (two) times daily.  . sildenafil (REVATIO) 20 MG tablet Take 3-5 pills daily as neede   No current facility-administered medications on file prior to visit.     Chief Complaint  Patient presents with   . Sleep Consult    Pt referred by Dr. Cheryll Cockayne. Pt wakes up 3-4 times to use restroom, not sleeping well. pt works 14 hour days on Fri thur Sun.    Sleep tests HST 10/23/10 >> AHI 5, SpO2 low 87%  Past medical history He  has a past medical history of Chest wall pain; Cough; Diabetes (HCC); Diarrhea (recurrent); GERD (gastroesophageal reflux disease); Headache(784.0); Irritable bowel syndrome; Obstructive sleep apnea; Postural lightheadedness; SBO (small bowel obstruction) (HCC) (12/2015); and Small bowel obstruction (HCC).  Vital signs BP 122/80 (BP Location: Left Arm, Cuff Size: Normal)   Pulse 77   Ht  (1.854 m)   Wt 208 lb 6.4 oz (94.5 kg)   SpO2 98%   BMI 27.50 kg/m   History of Present Illness [name] is a 51 y.o. male for evaluation of sleep problems.  I saw him previously in 2012 for mild sleep apnea.  He didn't tolerate CPAP.  He was tried with oral appliance, but caused mouth pain.  He still snores.  He doesn't feel like he gets deep sleep.  He stops breathing at night.  Feels tired all the time.  Has trouble staying awake at work.  His sleep time varies based on his work schedule.  He was working 3rd shift.  Now works extend shift 3 days per week.  Gets up at 4 am on work days, but then doesn't get home until 9 pm.  Goes to bed at midnight.  He denies sleep walking, sleep talking, bruxism, or nightmares.  There is no history of restless legs.  He denies sleep hallucinations, sleep paralysis, or cataplexy.  The Epworth score is 3 out of 24.   Physical Exam:  General - No distress ENT - No sinus tenderness,  no oral exudate, no LAN, no thyromegaly, TM clear, pupils equal/reactive Cardiac - s1s2 regular, no murmur, pulses symmetric Chest - No wheeze/rales/dullness, good air entry, normal respiratory excursion Back - No focal tenderness Abd - Soft, non-tender, no organomegaly, + bowel sounds Ext - No edema Neuro - Normal strength, cranial nerves  intact Skin - No rashes Psych - Normal mood, and behavior  Discussion: He has snoring, sleep disruption, apnea, and daytime sleepiness.  He has history of mild sleep apnea, but had trouble tolerating therapy before.  He has hx of borderline DM.  He also has shift work and poor sleep hygiene.  Assessment/plan:  Suspected sleep apnea. - will try to arrange for home sleep study >> explained insurance might not approve home sleep study, and then he would need in lab study  Shift work with poor sleep hygiene. - discussed proper sleep hygiene - discussed importance of maintaining a regular sleep-wake schedule, and allowing enough time for sleep   Patient Instructions  Will arrange for home sleep study Will call to arrange for follow up after sleep study reviewed     Coralyn Helling, M.D. Pager [phone] 09/09/2017, 2:51 PM

## 2017-09-09 NOTE — Patient Instructions (Signed)
Will arrange for home sleep study Will call to arrange for follow up after sleep study reviewed  

## 2017-09-09 NOTE — Progress Notes (Signed)
   Subjective:    Patient ID: [name], male    DOB: 03/07/1966, 51 y.o.   MRN: [id number]  HPI    Review of Systems  Constitutional: Negative for fever and unexpected weight change.  HENT: Positive for sore throat. Negative for congestion, dental problem, ear pain, nosebleeds, postnasal drip, rhinorrhea, sinus pressure, sneezing and trouble swallowing.   Eyes: Negative for redness and itching.  Respiratory: Negative for cough, chest tightness, shortness of breath and wheezing.   Cardiovascular: Positive for leg swelling. Negative for palpitations.  Gastrointestinal: Negative for nausea and vomiting.  Genitourinary: Negative for dysuria.  Musculoskeletal: Negative for joint swelling.  Skin: Negative for rash.  Allergic/Immunologic: Negative.  Negative for environmental allergies, food allergies and immunocompromised state.  Neurological: Negative for headaches.  Hematological: Does not bruise/bleed easily.  Psychiatric/Behavioral: Negative for dysphoric mood. The patient is not nervous/anxious.        Objective:   Physical Exam        Assessment & Plan:

## 2017-10-04 NOTE — Patient Instructions (Addendum)
Test(s) ordered today. Your results will be released to MyChart (or called to you) after review, usually within 72hours after test completion. If any changes need to be made, you will be notified at that same time.  All other Health Maintenance issues reviewed.   All recommended immunizations and age-appropriate screenings are up-to-date or discussed.  No immunizations administered today.   Medications reviewed and updated.  Changes include trying pantoprazole for your heartburn/stomah.  Your prescription(s) have been submitted to your pharmacy. Please take as directed and contact our office if you believe you are having problem(s) with the medication(s).  Please followup in 6 months    Health Maintenance, Male A healthy lifestyle and preventive care is important for your health and wellness. Ask your health care provider about what schedule of regular examinations is right for you. What should I know about weight and diet? Eat a Healthy Diet  Eat plenty of vegetables, fruits, whole grains, low-fat dairy products, and lean protein.  Do not eat a lot of foods high in solid fats, added sugars, or salt.  Maintain a Healthy Weight Regular exercise can help you achieve or maintain a healthy weight. You should:  Do at least 150 minutes of exercise each week. The exercise should increase your heart rate and make you sweat (moderate-intensity exercise).  Do strength-training exercises at least twice a week.  Watch Your Levels of Cholesterol and Blood Lipids  Have your blood tested for lipids and cholesterol every 5 years starting at 49 years of age. If you are at high risk for heart disease, you should start having your blood tested when you are 49 years old. You may need to have your cholesterol levels checked more often if: ? Your lipid or cholesterol levels are high. ? You are older than 49 years of age. ? You are at high risk for heart disease.  What should I know about cancer  screening? Many types of cancers can be detected early and may often be prevented. Lung Cancer  You should be screened every year for lung cancer if: ? You are a current smoker who has smoked for at least 30 years. ? You are a former smoker who has quit within the past 15 years.  Talk to your health care provider about your screening options, when you should start screening, and how often you should be screened.  Colorectal Cancer  Routine colorectal cancer screening usually begins at 50 years of age and should be repeated every 5-10 years until you are 49 years old. You may need to be screened more often if early forms of precancerous polyps or small growths are found. Your health care provider may recommend screening at an earlier age if you have risk factors for colon cancer.  Your health care provider may recommend using home test kits to check for hidden blood in the stool.  A small camera at the end of a tube can be used to examine your colon (sigmoidoscopy or colonoscopy). This checks for the earliest forms of colorectal cancer.  Prostate and Testicular Cancer  Depending on your age and overall health, your health care provider may do certain tests to screen for prostate and testicular cancer.  Talk to your health care provider about any symptoms or concerns you have about testicular or prostate cancer.  Skin Cancer  Check your skin from head to toe regularly.  Tell your health care provider about any new moles or changes in moles, especially if: ? There is a  change in a mole's size, shape, or color. ? You have a mole that is larger than a pencil eraser.  Always use sunscreen. Apply sunscreen liberally and repeat throughout the day.  Protect yourself by wearing long sleeves, pants, a wide-brimmed hat, and sunglasses when outside.  What should I know about heart disease, diabetes, and high blood pressure?  If you are 7518-49 years of age, have your blood pressure checked  every 3-5 years. If you are 49 years of age or older, have your blood pressure checked every year. You should have your blood pressure measured twice-once when you are at a hospital or clinic, and once when you are not at a hospital or clinic. Record the average of the two measurements. To check your blood pressure when you are not at a hospital or clinic, you can use: ? An automated blood pressure machine at a pharmacy. ? A home blood pressure monitor.  Talk to your health care provider about your target blood pressure.  If you are between 7145-49 years old, ask your health care provider if you should take aspirin to prevent heart disease.  Have regular diabetes screenings by checking your fasting blood sugar level. ? If you are at a normal weight and have a low risk for diabetes, have this test once every three years after the age of 49. ? If you are overweight and have a high risk for diabetes, consider being tested at a younger age or more often.  A one-time screening for abdominal aortic aneurysm (AAA) by ultrasound is recommended for men aged 65-75 years who are current or former smokers. What should I know about preventing infection? Hepatitis B If you have a higher risk for hepatitis B, you should be screened for this virus. Talk with your health care provider to find out if you are at risk for hepatitis B infection. Hepatitis C Blood testing is recommended for:  Everyone born from [birth year] through 1965.  Anyone with known risk factors for hepatitis C.  Sexually Transmitted Diseases (STDs)  You should be screened each year for STDs including gonorrhea and chlamydia if: ? You are sexually active and are younger than 49 years of age. ? You are older than 49 years of age and your health care provider tells you that you are at risk for this type of infection. ? Your sexual activity has changed since you were last screened and you are at an increased risk for chlamydia or gonorrhea. Ask your  health care provider if you are at risk.  Talk with your health care provider about whether you are at high risk of being infected with HIV. Your health care provider may recommend a prescription medicine to help prevent HIV infection.  What else can I do?  Schedule regular health, dental, and eye exams.  Stay current with your vaccines (immunizations).  Do not use any tobacco products, such as cigarettes, chewing tobacco, and e-cigarettes. If you need help quitting, ask your health care provider.  Limit alcohol intake to no more than 2 drinks per day. One drink equals 12 ounces of beer, 5 ounces of wine, or 1 ounces of hard liquor.  Do not use street drugs.  Do not share needles.  Ask your health care provider for help if you need support or information about quitting drugs.  Tell your health care provider if you often feel depressed.  Tell your health care provider if you have ever been abused or do not feel safe at home.  This information is not intended to replace advice given to you by your health care provider. Make sure you discuss any questions you have with your health care provider. Document Released: 05/22/2008 Document Revised: 07/23/2016 Document Reviewed: 08/28/2015 Elsevier Interactive Patient Education  2018 Elsevier Inc.  

## 2017-10-04 NOTE — Progress Notes (Signed)
Subjective:    Patient ID: [name], male    DOB: 05/27/1966, 51 y.o.   MRN: [id number]  HPI He is here for a physical exam.   For the past week he has not been feeling well.  He has had a sore throat (worse than usual), laryngitis, headache.  He took amoxicillin he had at home for 10 days.  He has a chronically sore throat.  He also states cough.  He is taking omeprazole once daily most days.  He has occasional GERD, but thinks his heartburn is controlled.   Medications and allergies reviewed with patient and updated if appropriate.  Patient Active Problem List   Diagnosis Date Noted  . Arthralgia 06/01/2017  . Other chest pain 06/01/2017  . Fatigue 06/01/2017  . Diabetes (HCC) 10/01/2016  . Family history of hepatitis 09/30/2016  . Meralgia paresthetica of right side 01/25/2016  . Vitamin D deficiency 01/25/2016  . SBO (small bowel obstruction) (HCC) 01/02/2016  . Allergic rhinitis 02/07/2011  . OBSTRUCTIVE SLEEP APNEA 10/23/2010  . GERD 03/12/2010  . Irritable bowel syndrome 03/12/2010  . DIARRHEA, RECURRENT 02/05/2010    Current Outpatient Prescriptions on File Prior to Visit  Medication Sig Dispense Refill  . cetirizine (ZYRTEC) 10 MG tablet Take 10 mg by mouth daily as needed for allergies.    Marland Kitchen. omeprazole (PRILOSEC) 40 MG capsule Take 1 capsule (40 mg total) by mouth 2 (two) times daily. 180 capsule 3  . sildenafil (REVATIO) 20 MG tablet Take 3-5 pills daily as neede 50 tablet 5   No current facility-administered medications on file prior to visit.     Past Medical History:  Diagnosis Date  . Chest wall pain   . Cough   . Diabetes (HCC)   . Diarrhea recurrent  . GERD (gastroesophageal reflux disease)   . Headache(784.0)   . Irritable bowel syndrome   . Obstructive sleep apnea    apnealink 10/23/10 AHI 5, Sp02 low 87%  . Postural lightheadedness   . SBO (small bowel obstruction) (HCC) 12/2015  . Small bowel obstruction [facility](HCC)     Past Surgical  History:  Procedure Laterality Date  . CHOLECYSTECTOMY  2009  . COLECTOMY  1994   segmental  . COLONOSCOPY  07/17/2011   hemorrhoids, otherwise normal including terminal ileum and random colon biopises  . GASTROSTOMY  2005   stamm  . LAPAROTOMY  2002, 2005, 2009  . SPHINCTEROTOMY  2009   lateral internal  . UPPER GASTROINTESTINAL ENDOSCOPY  07/17/2011   normal, including duodenal biopsies    Social History   Social History  . Marital status: Married    Spouse name: N/A  . Number of children: N/A  . Years of education: N/A   Social History Main Topics  . Smoking status: Never Smoker  . Smokeless tobacco: Never Used  . Alcohol use No  . Drug use: No  . Sexual activity: Not Asked   Other Topics Concern  . None   Social History Narrative   Occupation: Group Engineer, [employer] (deliveries)   Patient has never smoked.   Alcohol Use-no   Illicit Drug Use-no   Married-Divorced-Married again 2009-    3 children 2 sons and 1 daughter      No regular exercise    Family History  Problem Relation Age of Onset  . Liver disease Father        liver cancer???  . Hypertension Mother   . Pancreatic cancer Sister  Review of Systems  Constitutional: Negative for fever.  HENT: Positive for sore throat. Negative for congestion and sinus pain.   Respiratory: Positive for cough (occ productive). Negative for shortness of breath and wheezing.   Cardiovascular: Negative for chest pain, palpitations and leg swelling.  Gastrointestinal: Positive for constipation and diarrhea (alternates between diarrhea and constipation). Negative for abdominal pain, blood in stool and nausea.  Genitourinary: Negative for dysuria and hematuria (microscopic blood - w/u negative).  Musculoskeletal: Positive for arthralgias (hips). Negative for back pain.  Skin: Negative for color change and rash.       Dry skin - fingers  Neurological: Positive for headaches (recent only). Negative for dizziness and  light-headedness.  Psychiatric/Behavioral: Negative for dysphoric mood. The patient is not nervous/anxious.        Objective:   Vitals:   10/05/17 1412  BP: 100/68  Pulse: 90  Resp: 16  Temp: 97.8 F (36.6 C)  SpO2: 98%   Filed Weights   10/05/17 1412  Weight: 211 lb (95.7 kg)   Body mass index is 27.84 kg/m.  Wt Readings from Last 3 Encounters:  10/05/17 211 lb (95.7 kg)  09/09/17 208 lb 6.4 oz (94.5 kg)  06/01/17 210 lb (95.3 kg)     Physical Exam Constitutional: He appears well-developed and well-nourished. No distress.  HENT:  Head: Normocephalic and atraumatic.  Right Ear: External ear normal.  Left Ear: External ear normal.  Mouth/Throat: Oropharynx is clear and moist. No erythema or exudate Normal ear canals and TM b/l  Eyes: Conjunctivae and EOM are normal.  Neck: Neck supple. No tracheal deviation present. No thyromegaly present.  No carotid bruit  Cardiovascular: Normal rate, regular rhythm, normal heart sounds and intact distal pulses.   No murmur heard. Pulmonary/Chest: Effort normal and breath sounds normal. No respiratory distress. He has no wheezes. He has no rales.  Abdominal: Soft. He exhibits no distension. There is tenderness, that is chornic in nature.  No rebound or guarding.  Genitourinary: deferred  Musculoskeletal: He exhibits no edema.  Lymphadenopathy:   He has no cervical adenopathy.  Skin: Skin is warm and dry. He is not diaphoretic.  Psychiatric: He has a normal mood and affect. His behavior is normal.         Assessment & Plan:   Physical exam: Screening blood work ordered Immunizations  Up to date  Eye exams   Up to date  EKG  Last done 05/2017 Exercise  Active at work, not exercising Weight overweight - advised weight loss Skin    No concerns Substance abuse   none  See Problem List for Assessment and Plan of chronic medical problems.  FU in 6 months

## 2017-10-05 ENCOUNTER — Other Ambulatory Visit (INDEPENDENT_AMBULATORY_CARE_PROVIDER_SITE_OTHER): Payer: BLUE CROSS/BLUE SHIELD

## 2017-10-05 ENCOUNTER — Encounter: Payer: Self-pay | Admitting: Internal Medicine

## 2017-10-05 ENCOUNTER — Ambulatory Visit (INDEPENDENT_AMBULATORY_CARE_PROVIDER_SITE_OTHER): Payer: BLUE CROSS/BLUE SHIELD | Admitting: Internal Medicine

## 2017-10-05 VITALS — BP 100/68 | HR 90 | Temp 97.8°F | Resp 16 | Ht 73.0 in | Wt 211.0 lb

## 2017-10-05 DIAGNOSIS — J029 Acute pharyngitis, unspecified: Secondary | ICD-10-CM | POA: Diagnosis not present

## 2017-10-05 DIAGNOSIS — E119 Type 2 diabetes mellitus without complications: Secondary | ICD-10-CM

## 2017-10-05 DIAGNOSIS — K219 Gastro-esophageal reflux disease without esophagitis: Secondary | ICD-10-CM

## 2017-10-05 DIAGNOSIS — Z0000 Encounter for general adult medical examination without abnormal findings: Secondary | ICD-10-CM

## 2017-10-05 LAB — CBC WITH DIFFERENTIAL/PLATELET
BASOS ABS: 0.1 10*3/uL (ref 0.0–0.1)
BASOS PCT: 1.1 % (ref 0.0–3.0)
EOS ABS: 0.2 10*3/uL (ref 0.0–0.7)
Eosinophils Relative: 2.8 % (ref 0.0–5.0)
HEMATOCRIT: 43.9 % (ref 39.0–52.0)
Hemoglobin: 15 g/dL (ref 13.0–17.0)
LYMPHS PCT: 53.1 % — AB (ref 12.0–46.0)
Lymphs Abs: 3.5 10*3/uL (ref 0.7–4.0)
MCHC: 34.3 g/dL (ref 30.0–36.0)
MCV: 84.5 fl (ref 78.0–100.0)
Monocytes Absolute: 0.4 10*3/uL (ref 0.1–1.0)
Monocytes Relative: 6.7 % (ref 3.0–12.0)
Neutro Abs: 2.4 10*3/uL (ref 1.4–7.7)
Neutrophils Relative %: 36.3 % — ABNORMAL LOW (ref 43.0–77.0)
Platelets: 243 10*3/uL (ref 150.0–400.0)
RBC: 5.19 Mil/uL (ref 4.22–5.81)
RDW: 13.2 % (ref 11.5–15.5)
WBC: 6.5 10*3/uL (ref 4.0–10.5)

## 2017-10-05 LAB — MICROALBUMIN / CREATININE URINE RATIO
CREATININE, U: 185.9 mg/dL
MICROALB UR: 2.1 mg/dL — AB (ref 0.0–1.9)
Microalb Creat Ratio: 1.1 mg/g (ref 0.0–30.0)

## 2017-10-05 LAB — COMPREHENSIVE METABOLIC PANEL
ALBUMIN: 4.5 g/dL (ref 3.5–5.2)
ALT: 50 U/L (ref 0–53)
AST: 33 U/L (ref 0–37)
Alkaline Phosphatase: 63 U/L (ref 39–117)
BILIRUBIN TOTAL: 0.9 mg/dL (ref 0.2–1.2)
BUN: 11 mg/dL (ref 6–23)
CALCIUM: 9.9 mg/dL (ref 8.4–10.5)
CO2: 30 mEq/L (ref 19–32)
CREATININE: 0.96 mg/dL (ref 0.40–1.50)
Chloride: 98 mEq/L (ref 96–112)
GFR: 88.18 mL/min (ref >60.00)
Glucose, Bld: 102 mg/dL — ABNORMAL HIGH (ref 70–99)
Potassium: 4 mEq/L (ref 3.5–5.1)
Sodium: 136 mEq/L (ref 135–145)
Total Protein: 7.8 g/dL (ref 6.0–8.3)

## 2017-10-05 LAB — LIPID PANEL
CHOLESTEROL: 120 mg/dL (ref 0–200)
HDL: 32.9 mg/dL — ABNORMAL LOW (ref >39.00)
LDL CALC: 58 mg/dL (ref 0–99)
NonHDL: 86.83
Total CHOL/HDL Ratio: 4
Triglycerides: 144 mg/dL (ref 0.0–149.0)
VLDL: 28.8 mg/dL (ref 0.0–40.0)

## 2017-10-05 LAB — HEMOGLOBIN A1C: HEMOGLOBIN A1C: 6.7 % — AB (ref 4.6–6.5)

## 2017-10-05 MED ORDER — OMEPRAZOLE 40 MG PO CPDR: 40.0000 mg | DELAYED_RELEASE_CAPSULE | Freq: Two times a day (BID) | ORAL | 3 refills | Status: DC

## 2017-10-05 MED ORDER — PANTOPRAZOLE SODIUM 40 MG PO TBEC: 40.0000 mg | DELAYED_RELEASE_TABLET | Freq: Every day | ORAL | 11 refills | Status: DC

## 2017-10-05 NOTE — Assessment & Plan Note (Signed)
Compliant with a diabetic diet No regular exercise Will check a1c

## 2017-10-05 NOTE — Assessment & Plan Note (Signed)
Acute on chronic sore throat Likely related to GERD Took amoxicillin x 10 days and no improvement - not bacterial   Will treat gerd - call if no improvement

## 2017-10-05 NOTE — Assessment & Plan Note (Addendum)
?   GERD controlled - likely it is not controlled Increase omeprazole to BID - if not effective will try protonix daily - may need to increase to twice daily If sore throat persist may need to see GI

## 2017-10-06 ENCOUNTER — Telehealth: Payer: Self-pay

## 2017-10-06 NOTE — Telephone Encounter (Signed)
Received fax from pts pharmacy stating that omeprazole BID was not covered and they are requesting PA or alternative. Should we try protonix first which was listed in your note or would you prefer pt try the omeprazole BID first?

## 2017-10-06 NOTE — Telephone Encounter (Signed)
Try protonix first

## 2017-10-15 ENCOUNTER — Telehealth: Payer: Self-pay | Admitting: Emergency Medicine

## 2017-10-15 NOTE — Telephone Encounter (Signed)
See 10/30 phone note - I advised he try the protonix 40 mg once daily instead.

## 2017-10-15 NOTE — Telephone Encounter (Signed)
Insurance will not cover Omeprazole 2 times daily. Do you want PA completed to try to get approved?

## 2017-10-16 NOTE — Telephone Encounter (Signed)
Called mobile number. No answer and vm did not pick up.

## 2017-11-12 ENCOUNTER — Other Ambulatory Visit: Payer: Self-pay | Admitting: Internal Medicine

## 2017-12-22 ENCOUNTER — Other Ambulatory Visit: Payer: Self-pay | Admitting: Internal Medicine

## 2018-01-07 ENCOUNTER — Encounter: Payer: Self-pay | Admitting: Internal Medicine

## 2018-04-03 NOTE — Patient Instructions (Addendum)
  Test(s) ordered today. Your results will be released to MyChart (or called to you) after review, usually within 72hours after test completion. If any changes need to be made, you will be notified at that same time.  Medications reviewed and updated.  No changes recommended at this time.    Please followup in 6 months   

## 2018-04-03 NOTE — Progress Notes (Signed)
Subjective:    Patient ID: [name], male    DOB: Dec 02, 1966, 52 y.o.   MRN: [id number]  HPI The patient is here for follow up.  Diabetes: He is controlling his sugars with diet. He is compliant with a diabetic diet. He is not exercising regularly.  He checks his feet daily and denies foot lesions. He is up-to-date with an ophthalmology examination.   GERD:  He is taking his medication daily as prescribed.  He denies any GERD symptoms and feels his GERD is well controlled.   Fatigue, not feeling well:  He has not felt well for a few days.  He left early from work two days ago and did not go to work yesterday.  He has had UTIs in the past and has had frequent urination, dysuria occasionally and possibly a change in odor. He has had microscopic hematuria in the past and work up was negative.  He has been treated with antibiotics in the past.    Medications and allergies reviewed with patient and updated if appropriate.  Patient Active Problem List   Diagnosis Date Noted  . Dysuria 04/05/2018  . Arthralgia 06/01/2017  . Other chest pain 06/01/2017  . Fatigue 06/01/2017  . Diabetes (HCC) 10/01/2016  . Family history of hepatitis 09/30/2016  . Meralgia paresthetica of right side 01/25/2016  . Vitamin D deficiency 01/25/2016  . SBO (small bowel obstruction) (HCC) 01/02/2016  . Allergic rhinitis 02/07/2011  . OBSTRUCTIVE SLEEP APNEA 10/23/2010  . GERD 03/12/2010  . Irritable bowel syndrome 03/12/2010  . DIARRHEA, RECURRENT 02/05/2010    Current Outpatient Medications on File Prior to Visit  Medication Sig Dispense Refill  . cetirizine (ZYRTEC) 10 MG tablet Take 10 mg by mouth daily as needed for allergies.    . pantoprazole (PROTONIX) 40 MG tablet Take 1 tablet (40 mg total) by mouth daily. 30 tablet 11  . sildenafil (REVATIO) 20 MG tablet TAKE 3 TO 5 TABLETS AS NEEDED 50 tablet 4   No current facility-administered medications on file prior to visit.     Past Medical  History:  Diagnosis Date  . Chest wall pain   . Cough   . Diabetes (HCC)   . Diarrhea recurrent  . GERD (gastroesophageal reflux disease)   . Headache(784.0)   . Irritable bowel syndrome   . Obstructive sleep apnea    apnealink 10/23/10 AHI 5, Sp02 low 87%  . Postural lightheadedness   . SBO (small bowel obstruction) (HCC) 12/2015  . Small bowel obstruction [facility])     Past Surgical History:  Procedure Laterality Date  . CHOLECYSTECTOMY  2009  . COLECTOMY  1994   segmental  . COLONOSCOPY  07/17/2011   hemorrhoids, otherwise normal including terminal ileum and random colon biopises  . GASTROSTOMY  2005   stamm  . LAPAROTOMY  2002, 2005, 2009  . SPHINCTEROTOMY  2009   lateral internal  . UPPER GASTROINTESTINAL ENDOSCOPY  07/17/2011   normal, including duodenal biopsies    Social History   Socioeconomic History  . Marital status: Married    Spouse name: Not on file  . Number of children: Not on file  . Years of education: Not on file  . Highest education level: Not on file  Occupational History  . Not on file  Social Needs  . Financial resource strain: Not on file  . Food insecurity:    Worry: Not on file    Inability: Not on file  . Transportation needs:  Medical: Not on file    Non-medical: Not on file  Tobacco Use  . Smoking status: Never Smoker  . Smokeless tobacco: Never Used  Substance and Sexual Activity  . Alcohol use: No  . Drug use: No  . Sexual activity: Not on file  Lifestyle  . Physical activity:    Days per week: Not on file    Minutes per session: Not on file  . Stress: Not on file  Relationships  . Social connections:    Talks on phone: Not on file    Gets together: Not on file    Attends religious service: Not on file    Active member of club or organization: Not on file    Attends meetings of clubs or organizations: Not on file    Relationship status: Not on file  Other Topics Concern  . Not on file  Social History Narrative    Occupation: Group Engineer, [employer] (deliveries)   Patient has never smoked.   Alcohol Use-no   Illicit Drug Use-no   Married-Divorced-Married again 2009-    3 children 2 sons and 1 daughter      No regular exercise    Family History  Problem Relation Age of Onset  . Liver disease Father        liver cancer???  . Hypertension Mother   . Pancreatic cancer Sister     Review of Systems  Constitutional: Positive for fatigue. Negative for chills and fever.  HENT: Positive for sinus pain and sore throat (mild - allergies). Negative for congestion.   Respiratory: Negative for cough, shortness of breath and wheezing.   Cardiovascular: Negative for chest pain, palpitations and leg swelling.  Gastrointestinal: Positive for abdominal pain (mild - suprapubic and some mild mid abdominal pain) and constipation. Negative for blood in stool.  Genitourinary: Positive for dysuria and frequency. Negative for hematuria.  Neurological: Negative for light-headedness and headaches.       Objective:   Vitals:   04/05/18 1357  BP: 102/74  Pulse: (!) 55  Resp: 16  Temp: 97.7 F (36.5 C)  SpO2: 98%   BP Readings from Last 3 Encounters:  04/05/18 102/74  10/05/17 100/68  09/09/17 122/80   Wt Readings from Last 3 Encounters:  04/05/18 209 lb (94.8 kg)  10/05/17 211 lb (95.7 kg)  09/09/17 208 lb 6.4 oz (94.5 kg)   Body mass index is 27.57 kg/m.   Physical Exam    Constitutional: Appears well-developed and well-nourished. No distress.  HENT:  Head: Normocephalic and atraumatic.  Neck: Neck supple. No tracheal deviation present. No thyromegaly present.  No cervical lymphadenopathy Cardiovascular: Normal rate, regular rhythm and normal heart sounds.   No murmur heard. No carotid bruit .  No edema Pulmonary/Chest: Effort normal and breath sounds normal. No respiratory distress. No has no wheezes. No rales.  Abdomen:  Large vertical scar, soft, ND, mild suprapubic discomfort w/o  rebound or guarding mild mid abdominal tenderness w/o rebound or guarding Back: no CVA tenderness Skin: Skin is warm and dry. Not diaphoretic.  Psychiatric: Normal mood and affect. Behavior is normal.    Diabetic Foot Exam - Simple   Simple Foot Form Diabetic Foot exam was performed with the following findings:  Yes 04/05/2018  2:15 PM  Visual Inspection No deformities, no ulcerations, no other skin breakdown bilaterally:  Yes Sensation Testing Intact to touch and monofilament testing bilaterally:  Yes Pulse Check Posterior Tibialis and Dorsalis pulse intact bilaterally:  Yes Comments  Assessment & Plan:    See Problem List for Assessment and Plan of chronic medical problems.

## 2018-04-05 ENCOUNTER — Other Ambulatory Visit (INDEPENDENT_AMBULATORY_CARE_PROVIDER_SITE_OTHER): Payer: BLUE CROSS/BLUE SHIELD

## 2018-04-05 ENCOUNTER — Encounter: Payer: Self-pay | Admitting: Internal Medicine

## 2018-04-05 ENCOUNTER — Ambulatory Visit (INDEPENDENT_AMBULATORY_CARE_PROVIDER_SITE_OTHER): Payer: BLUE CROSS/BLUE SHIELD | Admitting: Internal Medicine

## 2018-04-05 VITALS — BP 102/74 | HR 55 | Temp 97.7°F | Resp 16 | Wt 209.0 lb

## 2018-04-05 DIAGNOSIS — R300 Dysuria: Secondary | ICD-10-CM

## 2018-04-05 DIAGNOSIS — E119 Type 2 diabetes mellitus without complications: Secondary | ICD-10-CM

## 2018-04-05 DIAGNOSIS — R5383 Other fatigue: Secondary | ICD-10-CM

## 2018-04-05 DIAGNOSIS — K219 Gastro-esophageal reflux disease without esophagitis: Secondary | ICD-10-CM

## 2018-04-05 LAB — LIPID PANEL
Cholesterol: 110 mg/dL (ref 0–200)
HDL: 31.2 mg/dL — ABNORMAL LOW (ref >39.00)
LDL Cholesterol: 53 mg/dL (ref 0–99)
NONHDL: 78.71
Total CHOL/HDL Ratio: 4
Triglycerides: 130 mg/dL (ref 0.0–149.0)
VLDL: 26 mg/dL (ref 0.0–40.0)

## 2018-04-05 LAB — URINALYSIS, ROUTINE W REFLEX MICROSCOPIC
BILIRUBIN URINE: NEGATIVE
KETONES UR: NEGATIVE
LEUKOCYTES UA: NEGATIVE
Nitrite: NEGATIVE
SPECIFIC GRAVITY, URINE: 1.01 (ref 1.000–1.030)
Total Protein, Urine: NEGATIVE
UROBILINOGEN UA: 0.2 (ref 0.0–1.0)
Urine Glucose: NEGATIVE
pH: 7.5 (ref 5.0–8.0)

## 2018-04-05 LAB — CBC WITH DIFFERENTIAL/PLATELET
BASOS PCT: 0.6 % (ref 0.0–3.0)
Basophils Absolute: 0 10*3/uL (ref 0.0–0.1)
EOS PCT: 1.9 % (ref 0.0–5.0)
Eosinophils Absolute: 0.1 10*3/uL (ref 0.0–0.7)
HCT: 42.7 % (ref 39.0–52.0)
Hemoglobin: 14.9 g/dL (ref 13.0–17.0)
LYMPHS ABS: 2.8 10*3/uL (ref 0.7–4.0)
Lymphocytes Relative: 57.4 % — ABNORMAL HIGH (ref 12.0–46.0)
MCHC: 34.9 g/dL (ref 30.0–36.0)
MCV: 82.3 fl (ref 78.0–100.0)
MONOS PCT: 9.3 % (ref 3.0–12.0)
Monocytes Absolute: 0.5 10*3/uL (ref 0.1–1.0)
NEUTROS ABS: 1.5 10*3/uL (ref 1.4–7.7)
NEUTROS PCT: 30.8 % — AB (ref 43.0–77.0)
PLATELETS: 218 10*3/uL (ref 150.0–400.0)
RBC: 5.19 Mil/uL (ref 4.22–5.81)
RDW: 13.6 % (ref 11.5–15.5)
WBC: 5 10*3/uL (ref 4.0–10.5)

## 2018-04-05 LAB — COMPREHENSIVE METABOLIC PANEL
ALT: 34 U/L (ref 0–53)
AST: 22 U/L (ref 0–37)
Albumin: 4.3 g/dL (ref 3.5–5.2)
Alkaline Phosphatase: 64 U/L (ref 39–117)
BUN: 9 mg/dL (ref 6–23)
CO2: 31 meq/L (ref 19–32)
Calcium: 9.7 mg/dL (ref 8.4–10.5)
Chloride: 100 mEq/L (ref 96–112)
Creatinine, Ser: 0.9 mg/dL (ref 0.40–1.50)
GFR: 94.81 mL/min (ref >60.00)
GLUCOSE: 105 mg/dL — AB (ref 70–99)
POTASSIUM: 4 meq/L (ref 3.5–5.1)
Sodium: 136 mEq/L (ref 135–145)
Total Bilirubin: 1 mg/dL (ref 0.2–1.2)
Total Protein: 7.7 g/dL (ref 6.0–8.3)

## 2018-04-05 LAB — HEMOGLOBIN A1C: HEMOGLOBIN A1C: 6.7 % — AB (ref 4.6–6.5)

## 2018-04-05 LAB — MICROALBUMIN / CREATININE URINE RATIO
CREATININE, U: 92.8 mg/dL
Microalb Creat Ratio: 0.8 mg/g (ref 0.0–30.0)

## 2018-04-05 LAB — TSH: TSH: 0.84 u[IU]/mL (ref 0.35–4.50)

## 2018-04-05 NOTE — Assessment & Plan Note (Signed)
GERD controlled Continue daily medication  

## 2018-04-05 NOTE — Assessment & Plan Note (Addendum)
Cause unknown ? UTI - check UA, UCx No other localizing symptoms Check labs -  Tsh, cbc, cmp

## 2018-04-05 NOTE — Assessment & Plan Note (Signed)
Check UA, UCx to rule out infection 

## 2018-04-05 NOTE — Assessment & Plan Note (Addendum)
Diet controlled Check a1c, lipids Encouraged regular exercise F/u in 6 months

## 2018-04-06 LAB — URINE CULTURE
MICRO NUMBER: 90519008
SPECIMEN QUALITY:: ADEQUATE

## 2018-04-09 ENCOUNTER — Ambulatory Visit: Payer: Self-pay

## 2018-04-09 ENCOUNTER — Emergency Department (HOSPITAL_COMMUNITY): Payer: BLUE CROSS/BLUE SHIELD

## 2018-04-09 ENCOUNTER — Emergency Department (HOSPITAL_COMMUNITY)
Admission: EM | Admit: 2018-04-09 | Discharge: 2018-04-09 | Disposition: A | Discharge status: Home / Self Care | Payer: BLUE CROSS/BLUE SHIELD | Attending: Physician Assistant | Admitting: Physician Assistant

## 2018-04-09 ENCOUNTER — Encounter (HOSPITAL_COMMUNITY): Payer: Self-pay | Admitting: Emergency Medicine

## 2018-04-09 DIAGNOSIS — E119 Type 2 diabetes mellitus without complications: Secondary | ICD-10-CM | POA: Insufficient documentation

## 2018-04-09 DIAGNOSIS — K2970 Gastritis, unspecified, without bleeding: Secondary | ICD-10-CM

## 2018-04-09 DIAGNOSIS — Z79899 Other long term (current) drug therapy: Secondary | ICD-10-CM | POA: Insufficient documentation

## 2018-04-09 DIAGNOSIS — R0789 Other chest pain: Secondary | ICD-10-CM | POA: Diagnosis not present

## 2018-04-09 DIAGNOSIS — R0602 Shortness of breath: Secondary | ICD-10-CM | POA: Diagnosis not present

## 2018-04-09 DIAGNOSIS — R079 Chest pain, unspecified: Secondary | ICD-10-CM | POA: Diagnosis not present

## 2018-04-09 LAB — BASIC METABOLIC PANEL
ANION GAP: 12 (ref 5–15)
BUN: 8 mg/dL (ref 6–20)
CHLORIDE: 102 mmol/L (ref 101–111)
CO2: 24 mmol/L (ref 22–32)
Calcium: 9.7 mg/dL (ref 8.9–10.3)
Creatinine, Ser: 0.89 mg/dL (ref 0.61–1.24)
GFR calc Af Amer: >60 mL/min (ref >60)
GFR calc non Af Amer: >60 mL/min (ref >60)
GLUCOSE: 123 mg/dL — AB (ref 65–99)
Potassium: 3.9 mmol/L (ref 3.5–5.1)
Sodium: 138 mmol/L (ref 135–145)

## 2018-04-09 LAB — CBC
HCT: 41.7 % (ref 39.0–52.0)
HEMOGLOBIN: 14.3 g/dL (ref 13.0–17.0)
MCH: 28.7 pg (ref 26.0–34.0)
MCHC: 34.3 g/dL (ref 30.0–36.0)
MCV: 83.6 fL (ref 78.0–100.0)
Platelets: 229 10*3/uL (ref 150–400)
RBC: 4.99 MIL/uL (ref 4.22–5.81)
RDW: 13.1 % (ref 11.5–15.5)
WBC: 4.4 10*3/uL (ref 4.0–10.5)

## 2018-04-09 LAB — I-STAT TROPONIN, ED
Troponin i, poc: 0 ng/mL (ref 0.00–0.08)
Troponin i, poc: 0.01 ng/mL (ref 0.00–0.08)

## 2018-04-09 MED ORDER — FAMOTIDINE 20 MG PO TABS: 20.0000 mg | ORAL_TABLET | Freq: Two times a day (BID) | ORAL | 0 refills | Status: DC

## 2018-04-09 MED ORDER — KETOROLAC TROMETHAMINE 30 MG/ML IJ SOLN
30.0000 mg | Freq: Once | INTRAMUSCULAR | Status: AC
  Administered 2018-04-09: 30 mg via INTRAVENOUS
  Filled 2018-04-09: qty 1

## 2018-04-09 MED ORDER — SODIUM CHLORIDE 0.9 % IV BOLUS
1000.0000 mL | Freq: Once | INTRAVENOUS | Status: AC
  Administered 2018-04-09: 1000 mL via INTRAVENOUS

## 2018-04-09 MED ORDER — DICYCLOMINE HCL 20 MG PO TABS: 20.0000 mg | ORAL_TABLET | Freq: Two times a day (BID) | ORAL | 0 refills | Status: DC

## 2018-04-09 MED ORDER — GI COCKTAIL ~~LOC~~
30.0000 mL | Freq: Once | ORAL | Status: AC
  Administered 2018-04-09: 30 mL via ORAL
  Filled 2018-04-09: qty 30

## 2018-04-09 NOTE — Telephone Encounter (Signed)
Phone call from Occupational Health Nurse from pt's work site.  Reported the pt. is present in her office with c/o fatigue and chest pain.  In triaging pt., he reported chest discomfort is in his "left breast"; rated at a 5/10, and intermittent. Denied radiation of the pain.  Stated it has lasted as long as 15 min., and comes and goes; over past 2 days.  Reported some sweating yesterday.  Denied nausea/vomiting.  Denied shortness of breath at this time.  Reported he has been very tired.  Per Occupational Health Nurse, BP 110/78, P. 86 and irregular.   Advised pt. To go to the nearest ER.  Reported there is a coworker that can drive him to the ER.  Pt. Agrees with recommendation.     Reason for Disposition . Chest pain lasts > 5 minutes (Exceptions: chest pain occurring > 3 days ago and now asymptomatic; same as previously diagnosed heartburn and has accompanying sour taste in mouth)  Answer Assessment - Initial Assessment Questions 1. LOCATION: "Where does it hurt?"       Left breast area 2. RADIATION: "Does the pain go anywhere else?" (e.g., into neck, jaw, arms, back)    No radiation 3. ONSET: "When did the chest pain begin?" (Minutes, hours or days)      Yesterday; reported he went to the [facility]. @ 8:00 PM last night to check BP and pulse; BP was 121/?, and pulse was 115 and irreg. 4. PATTERN "Does the pain come and go, or has it been constant since it started?"  "Does it get worse with exertion?"      Comes and goes  5. DURATION: "How long does it last" (e.g., seconds, minutes, hours)     "15 min or so" 6. SEVERITY: "How bad is the pain?"  (e.g., Scale 1-10; mild, moderate, or severe)    - MILD (1-3): doesn't interfere with normal activities     - MODERATE (4-7): interferes with normal activities or awakens from sleep    - SEVERE (8-10): excruciating pain, unable to do any normal activities      5/10; can vary in intensity 7. CARDIAC RISK FACTORS: "Do you have any history of heart  problems or risk factors for heart disease?" (e.g., prior heart attack, angina; high blood pressure, diabetes, being overweight, high cholesterol, smoking, or strong family history of heart disease)     Never had this pain before; denied cardiac hx 8. PULMONARY RISK FACTORS: "Do you have any history of lung disease?"  (e.g., blood clots in lung, asthma, emphysema, birth control pills)     Not asked 9. CAUSE: "What do you think is causing the chest pain?"    Unsure 10. OTHER SYMPTOMS: "Do you have any other symptoms?" (e.g., dizziness, nausea, vomiting, sweating, fever, difficulty breathing, cough)      Denied short of breath; c/o headache, some sweating, fatigue over past 2 days  11. PREGNANCY: "Is there any chance you are pregnant?" "When was your last menstrual period?"       n/a  Protocols used: CHEST PAIN-A-AH

## 2018-04-09 NOTE — ED Notes (Signed)
Pt spoke to tech, and asked this RN to come speak to them.  Spoke to patient/son, and the expressed concerns about wait time.  Patient denies any changes in condition, but states he is concerned because he was sent here by his MD for further assessment.  This RN reassured patient, explained reasons for wait, and rationale for waiting to see MD.  Patient states he has not eaten since yesterday.  This RN provided patient with ginger ale.

## 2018-04-09 NOTE — ED Notes (Signed)
Results reviewed.  No changes to acuity at this time 

## 2018-04-09 NOTE — ED Notes (Signed)
Pt initially refused medications, IV and blood work. Pt states "the doctor hasn't even been in here to discuss results." Pt then says "I think the lab is unnecessary. How long is all of this supposed to take, I'm ready to go home." This RN explained reasoning for orders and blood work. This RN explained to pt that Hina, PA is the provider in charge of his care and that she had come in earlier to discuss results. After speaking with patient more, pt consented to interventions and repeat troponin

## 2018-04-09 NOTE — ED Notes (Signed)
ED Provider at bedside. 

## 2018-04-09 NOTE — ED Notes (Signed)
Explained wait time to patient. Patient verbalizes understanding

## 2018-04-09 NOTE — ED Notes (Signed)
Nurse will collect labs. 

## 2018-04-09 NOTE — Telephone Encounter (Signed)
Pt went to [facility] ED

## 2018-04-09 NOTE — ED Provider Notes (Signed)
[facility] EMERGENCY DEPARTMENT Provider Note   CSN: [id number] Arrival date & time: 04/09/18  1136     History   Chief Complaint Chief Complaint  Patient presents with  . Chest Pain    HPI [name] is a 52 y.o. male with a past medical history of GERD, IBS, who presents to ED for evaluation of 1 week history of intermittent left-sided chest pain.  States that the pain got worse yesterday.  Describes it as a burning sensation on the left side of his chest.  He took ibuprofen and his antacid yesterday with improvement in his symptoms.  He denies any prior history of similar symptoms in the past.  Denies any shortness of breath, hemoptysis, abdominal pain, vomiting, nausea URI symptoms, fever, wheezing, recent surgeries, recent prolonged travel, history of DVT or PE, MI, hormone use, family history of CAD.  Patient denies tobacco, alcohol or other drug use.  HPI  Past Medical History:  Diagnosis Date  . Chest wall pain   . Cough   . Diabetes (HCC)   . Diarrhea recurrent  . GERD (gastroesophageal reflux disease)   . Headache(784.0)   . Irritable bowel syndrome   . Obstructive sleep apnea    apnealink 10/23/10 AHI 5, Sp02 low 87%  . Postural lightheadedness   . SBO (small bowel obstruction) (HCC) 12/2015  . Small bowel obstruction [facility])     Patient Active Problem List   Diagnosis Date Noted  . Dysuria 04/05/2018  . Arthralgia 06/01/2017  . Other chest pain 06/01/2017  . Fatigue 06/01/2017  . Diabetes (HCC) 10/01/2016  . Family history of hepatitis 09/30/2016  . Meralgia paresthetica of right side 01/25/2016  . Vitamin D deficiency 01/25/2016  . SBO (small bowel obstruction) (HCC) 01/02/2016  . Allergic rhinitis 02/07/2011  . OBSTRUCTIVE SLEEP APNEA 10/23/2010  . GERD 03/12/2010  . Irritable bowel syndrome 03/12/2010  . DIARRHEA, RECURRENT 02/05/2010    Past Surgical History:  Procedure Laterality Date  . CHOLECYSTECTOMY  2009  . COLECTOMY   1994   segmental  . COLONOSCOPY  07/17/2011   hemorrhoids, otherwise normal including terminal ileum and random colon biopises  . GASTROSTOMY  2005   stamm  . LAPAROTOMY  2002, 2005, 2009  . SPHINCTEROTOMY  2009   lateral internal  . UPPER GASTROINTESTINAL ENDOSCOPY  07/17/2011   normal, including duodenal biopsies        Home Medications    Prior to Admission medications   Medication Sig Start Date End Date Taking? Authorizing Provider  cetirizine (ZYRTEC) 10 MG tablet Take 10 mg by mouth daily as needed for allergies.   Yes [provider]  ibuprofen (ADVIL,MOTRIN) 200 MG tablet Take 600 mg by mouth every 6 (six) hours as needed (for headaches or pain).   Yes [provider]  pantoprazole (PROTONIX) 40 MG tablet Take 1 tablet (40 mg total) by mouth daily. 10/05/17  Yes Burns, [name], MD  sildenafil (REVATIO) 20 MG tablet TAKE 3 TO 5 TABLETS AS NEEDED 12/22/17  Yes Burns, [name], MD  dicyclomine (BENTYL) 20 MG tablet Take 1 tablet (20 mg total) by mouth 2 (two) times daily. 04/09/18   Gustave Lindeman, PA-C  famotidine (PEPCID) 20 MG tablet Take 1 tablet (20 mg total) by mouth 2 (two) times daily. 04/09/18   Dietrich Pates, PA-C    Family History Family History  Problem Relation Age of Onset  . Liver disease Father  liver cancer???  . Hypertension Mother   . Pancreatic cancer Sister     Social History Social History   Tobacco Use  . Smoking status: Never Smoker  . Smokeless tobacco: Never Used  Substance Use Topics  . Alcohol use: No  . Drug use: No     Allergies   Patient has no known allergies.   Review of Systems Review of Systems  Constitutional: Negative for appetite change, chills and fever.  HENT: Negative for ear pain, rhinorrhea, sneezing and sore throat.   Eyes: Negative for photophobia and visual disturbance.  Respiratory: Negative for cough, chest tightness, shortness of breath and wheezing.   Cardiovascular: Positive for chest pain.  Negative for palpitations.  Gastrointestinal: Negative for abdominal pain, blood in stool, constipation, diarrhea, nausea and vomiting.  Genitourinary: Negative for dysuria, hematuria and urgency.  Musculoskeletal: Negative for myalgias.  Skin: Negative for rash.  Neurological: Negative for dizziness, weakness and light-headedness.     Physical Exam Updated Vital Signs BP 122/78   Pulse (!) 56   Temp 97.9 F (36.6 C) (Oral)   Resp 16   Ht  (1.854 m)   Wt 95.3 kg (210 lb)   SpO2 99%   BMI 27.71 kg/m   Physical Exam  Constitutional: He appears well-developed and well-nourished. No distress.  Nontoxic-appearing and in no acute distress.  HENT:  Head: Normocephalic and atraumatic.  Nose: Nose normal.  Eyes: Conjunctivae and EOM are normal. Right eye exhibits no discharge. Left eye exhibits no discharge. No scleral icterus.  Neck: Normal range of motion. Neck supple.  Cardiovascular: Normal rate, regular rhythm, normal heart sounds and intact distal pulses. Exam reveals no gallop and no friction rub.  No murmur heard. Pulmonary/Chest: Effort normal and breath sounds normal. No respiratory distress. He exhibits tenderness.    Abdominal: Soft. Bowel sounds are normal. He exhibits no distension. There is no tenderness. There is no guarding.  Musculoskeletal: Normal range of motion. He exhibits no edema.  No lower extremity edema, erythema or calf tenderness bilaterally.  Neurological: He is alert. He exhibits normal muscle tone. Coordination normal.  Skin: Skin is warm and dry. No rash noted.  Psychiatric: He has a normal mood and affect.  Nursing note and vitals reviewed.    ED Treatments / Results  Labs (all labs ordered are listed, but only abnormal results are displayed) Labs Reviewed  BASIC METABOLIC PANEL - Abnormal; Notable for the following components:      Result Value   Glucose, Bld 123 (*)    All other components within normal limits  CBC  I-STAT  TROPONIN, ED  I-STAT TROPONIN, ED    EKG EKG Interpretation  Date/Time:  Friday Apr 09 2018 11:40:47 EDT Ventricular Rate:  61 PR Interval:  136 QRS Duration: 130 QT Interval:  412 QTC Calculation: 414 R Axis:   28 Text Interpretation:  Normal sinus rhythm with sinus arrhythmia Right bundle branch block Abnormal ECG No significant change since last tracing Confirmed by Bary Castilla ([id number]) on 04/09/2018 9:12:10 PM   Radiology Dg Chest 2 View  Result Date: 04/09/2018 CLINICAL DATA:  Shortness of breath. EXAM: CHEST - 2 VIEW COMPARISON:  Radiographs of August 05, 2016. FINDINGS: The heart size and mediastinal contours are within normal limits. Both lungs are clear. No pneumothorax or pleural effusion is noted. The visualized skeletal structures are unremarkable. IMPRESSION: No active cardiopulmonary disease. Electronically Signed   By: Lupita Raider, M.D.   On: 04/09/2018 12:13  Procedures Procedures (including critical care time)  Medications Ordered in ED Medications  sodium chloride 0.9 % bolus 1,000 mL (1,000 mLs Intravenous New Bag/Given 04/09/18 2119)  ketorolac (TORADOL) 30 MG/ML injection 30 mg (30 mg Intravenous Given 04/09/18 2119)  gi cocktail (Maalox,Lidocaine,Donnatal) (30 mLs Oral Given 04/09/18 2119)     Initial Impression / Assessment and Plan / ED Course  I have reviewed the triage vital signs and the nursing notes.  Pertinent labs & imaging results that were available during my care of the patient were reviewed by me and considered in my medical decision making (see chart for details).     Patient presents to ED for evaluation of 1 week history of intermittent left-sided chest pain.  States that the pain got worse yesterday.  Described as a burning sensation on the left side of his chest.  He does have a history of GERD and IBS.  Denies any shortness of breath, hemoptysis, abdominal pain, vomiting, nausea, URI symptoms, recent surgeries, recent prolonged  travel, history of DVT, PE or MI.  There is no family history of CAD.  He denies any tobacco, alcohol or other drug use.  No history of hypertension, diabetes, hyperlipidemia that would make him at risk for CAD.  Lab work including initial troponin, CBC, BMP unremarkable.  EKG with no ischemic changes.  Chest x-ray unremarkable.  Delta troponin returned as negative.  Patient is low risk by heart score of 1.  He is Wells criteria negative.  He reports improvement in his symptoms with anti-inflammatories and GI cocktail.  He has not febrile, hypoxic, tachycardic or tachypneic.  Suspect that symptoms are due to musculoskeletal cause or gastritis.  Doubt cardiac or pulmonary cause of symptoms.  Advised patient to follow-up with his PCP for further evaluation if symptoms persist.  Final Clinical Impressions(s) / ED Diagnoses   Final diagnoses:  Chest wall pain  Gastritis without bleeding, unspecified chronicity, unspecified gastritis type    ED Discharge Orders        Ordered    famotidine (PEPCID) 20 MG tablet  2 times daily     04/09/18 2128    dicyclomine (BENTYL) 20 MG tablet  2 times daily     04/09/18 2128       Dietrich Pates, PA-C 04/09/18 2146    Abelino Derrick, MD 04/18/18 [phone]

## 2018-04-09 NOTE — ED Triage Notes (Addendum)
Pt states he has had chest pain for several days that comes and goes to left chest, non radiating. Pt states he feels very tired and weak. Pt states he was told his bp was low. 106/72 at triage. Some pain with urination. Had a culture done at his doctors office. Pt had fasting labs and urine done at doctor on Monday.

## 2018-05-12 NOTE — Progress Notes (Signed)
Subjective:    Patient ID: Mariah MillingEltagane A Previti, male    DOB: 07/23/1966, 52 y.o.   MRN: [id number]  HPI He is here for an acute visit for cold symptoms.  His symptoms started about 2 weeks ago    He is experiencing productive cough, wheeze, decreased appetite, fatigue, sore throat and headaches.  He denies fever, sob, chest pain.  He denies nasal congestion, ear pain, sinus pain.   He has tried taking ibuprofen, nyquil.  He took amoxicillin every 8 hours x 10 doses a couple of weeks ago    He was concerned about his sugar and wanted it to be checked.  He has been very compliant with a low sugar/carb diet.    Medications and allergies reviewed with patient and updated if appropriate.  Patient Active Problem List   Diagnosis Date Noted  . Dysuria 04/05/2018  . Arthralgia 06/01/2017  . Other chest pain 06/01/2017  . Fatigue 06/01/2017  . Diabetes (HCC) 10/01/2016  . Family history of hepatitis 09/30/2016  . Meralgia paresthetica of right side 01/25/2016  . Vitamin D deficiency 01/25/2016  . SBO (small bowel obstruction) (HCC) 01/02/2016  . Allergic rhinitis 02/07/2011  . OBSTRUCTIVE SLEEP APNEA 10/23/2010  . GERD 03/12/2010  . Irritable bowel syndrome 03/12/2010  . DIARRHEA, RECURRENT 02/05/2010    Current Outpatient Medications on File Prior to Visit  Medication Sig Dispense Refill  . cetirizine (ZYRTEC) 10 MG tablet Take 10 mg by mouth daily as needed for allergies.    Marland Kitchen. dicyclomine (BENTYL) 20 MG tablet Take 1 tablet (20 mg total) by mouth 2 (two) times daily. 20 tablet 0  . famotidine (PEPCID) 20 MG tablet Take 1 tablet (20 mg total) by mouth 2 (two) times daily. 30 tablet 0  . ibuprofen (ADVIL,MOTRIN) 200 MG tablet Take 600 mg by mouth every 6 (six) hours as needed (for headaches or pain).    . pantoprazole (PROTONIX) 40 MG tablet Take 1 tablet (40 mg total) by mouth daily. 30 tablet 11  . sildenafil (REVATIO) 20 MG tablet TAKE 3 TO 5 TABLETS AS NEEDED 50 tablet 4   No  current facility-administered medications on file prior to visit.     Past Medical History:  Diagnosis Date  . Chest wall pain   . Cough   . Diabetes (HCC)   . Diarrhea recurrent  . GERD (gastroesophageal reflux disease)   . Headache(784.0)   . Irritable bowel syndrome   . Obstructive sleep apnea    apnealink 10/23/10 AHI 5, Sp02 low 87%  . Postural lightheadedness   . SBO (small bowel obstruction) (HCC) 12/2015  . Small bowel obstruction [facility](HCC)     Past Surgical History:  Procedure Laterality Date  . CHOLECYSTECTOMY  2009  . COLECTOMY  1994   segmental  . COLONOSCOPY  07/17/2011   hemorrhoids, otherwise normal including terminal ileum and random colon biopises  . GASTROSTOMY  2005   stamm  . LAPAROTOMY  2002, 2005, 2009  . SPHINCTEROTOMY  2009   lateral internal  . UPPER GASTROINTESTINAL ENDOSCOPY  07/17/2011   normal, including duodenal biopsies    Social History   Socioeconomic History  . Marital status: Married    Spouse name: Not on file  . Number of children: Not on file  . Years of education: Not on file  . Highest education level: Not on file  Occupational History  . Not on file  Social Needs  . Financial resource strain: Not on file  .  Food insecurity:    Worry: Not on file    Inability: Not on file  . Transportation needs:    Medical: Not on file    Non-medical: Not on file  Tobacco Use  . Smoking status: Never Smoker  . Smokeless tobacco: Never Used  Substance and Sexual Activity  . Alcohol use: No  . Drug use: No  . Sexual activity: Not on file  Lifestyle  . Physical activity:    Days per week: Not on file    Minutes per session: Not on file  . Stress: Not on file  Relationships  . Social connections:    Talks on phone: Not on file    Gets together: Not on file    Attends religious service: Not on file    Active member of club or organization: Not on file    Attends meetings of clubs or organizations: Not on file    Relationship status:  Not on file  Other Topics Concern  . Not on file  Social History Narrative   Occupation: Group Engineer, [employer] (deliveries)   Patient has never smoked.   Alcohol Use-no   Illicit Drug Use-no   Married-Divorced-Married again 2009-    3 children 2 sons and 1 daughter      No regular exercise    Family History  Problem Relation Age of Onset  . Liver disease Father        liver cancer???  . Hypertension Mother   . Pancreatic cancer Sister     Review of Systems  Constitutional: Positive for appetite change (decreased) and fatigue. Negative for fever.  HENT: Positive for sore throat. Negative for congestion, ear pain and sinus pain.   Respiratory: Positive for cough (prodcutive with yellow sputum) and wheezing (yesterday). Negative for shortness of breath.   Cardiovascular: Negative for chest pain.  Gastrointestinal: Negative for nausea.  Neurological: Positive for headaches. Negative for light-headedness.       Objective:   Vitals:   05/13/18 1109  BP: (!) 86/68  Pulse: 61  Resp: 16  Temp: 97.9 F (36.6 C)  SpO2: 98%   Filed Weights   05/13/18 1109  Weight: 209 lb (94.8 kg)   Body mass index is 27.57 kg/m.  Wt Readings from Last 3 Encounters:  05/13/18 209 lb (94.8 kg)  04/09/18 210 lb (95.3 kg)  04/05/18 209 lb (94.8 kg)     Physical Exam GENERAL APPEARANCE: Appears stated age, well appearing, NAD EYES: conjunctiva clear, no icterus HEENT: bilateral tympanic membranes and ear canals normal, oropharynx with no erythema, no thyromegaly, trachea midline, no cervical or supraclavicular lymphadenopathy LUNGS: Clear to auscultation without wheeze or crackles, unlabored breathing, good air entry bilaterally CARDIOVASCULAR: Normal S1,S2 without murmurs, no edema SKIN: warm, dry        Assessment & Plan:   See Problem List for Assessment and Plan of chronic medical problems.

## 2018-05-13 ENCOUNTER — Encounter: Payer: Self-pay | Admitting: Internal Medicine

## 2018-05-13 ENCOUNTER — Ambulatory Visit (INDEPENDENT_AMBULATORY_CARE_PROVIDER_SITE_OTHER): Payer: BLUE CROSS/BLUE SHIELD | Admitting: Internal Medicine

## 2018-05-13 VITALS — BP 86/68 | HR 61 | Temp 97.9°F | Resp 16 | Wt 209.0 lb

## 2018-05-13 DIAGNOSIS — J22 Unspecified acute lower respiratory infection: Secondary | ICD-10-CM | POA: Diagnosis not present

## 2018-05-13 DIAGNOSIS — E119 Type 2 diabetes mellitus without complications: Secondary | ICD-10-CM

## 2018-05-13 LAB — GLUCOSE, POCT (MANUAL RESULT ENTRY): POC Glucose: 140 mg/dl — AB (ref 70–99)

## 2018-05-13 MED ORDER — FAMOTIDINE 20 MG PO TABS: 20.0000 mg | ORAL_TABLET | Freq: Every day | ORAL | 11 refills | Status: DC

## 2018-05-13 MED ORDER — CEFDINIR 300 MG PO CAPS: 300.0000 mg | ORAL_CAPSULE | Freq: Two times a day (BID) | ORAL | 0 refills | Status: DC

## 2018-05-13 NOTE — Assessment & Plan Note (Signed)
Productive cough for 2 weeks He did take some amoxicillin he had at home but much improvement Symptoms have not been improving and overall he does not feel well Will prescribe an antibiotic-Omnicef twice daily x10 days Over-the-counter cold medications as needed Rest, fluids Call if no improvement

## 2018-05-13 NOTE — Patient Instructions (Addendum)
Start taking the prescribed antibiotic and complete the entire course.  Keep taking the pepcid at night and continue the pantoprazole as well once a day.    Your prescription(s) have been submitted to your pharmacy. Please take as directed and contact our office if you believe you are having problem(s) with the medication(s).

## 2018-05-13 NOTE — Assessment & Plan Note (Signed)
He has been very compliant with a diabetic diet Acute respiratory illness now-concern over sugars We will check fingerstick

## 2018-05-27 DIAGNOSIS — R143 Flatulence: Secondary | ICD-10-CM | POA: Diagnosis not present

## 2018-05-27 DIAGNOSIS — R197 Diarrhea, unspecified: Secondary | ICD-10-CM | POA: Diagnosis not present

## 2018-05-27 DIAGNOSIS — R109 Unspecified abdominal pain: Secondary | ICD-10-CM | POA: Diagnosis not present

## 2018-05-27 DIAGNOSIS — R110 Nausea: Secondary | ICD-10-CM | POA: Diagnosis not present

## 2018-06-09 ENCOUNTER — Telehealth: Payer: Self-pay | Admitting: Pulmonary Disease

## 2018-06-09 NOTE — Telephone Encounter (Signed)
Called patient unable to reach left message to give us a call back.    [facility] do you know anything about this?

## 2018-06-09 NOTE — Telephone Encounter (Signed)
Attempted to call patient today regarding scheduling of sleep study. I did not receive an answer at time of call. I have left a voicemail message for pt to return call. X1

## 2018-06-09 NOTE — Telephone Encounter (Signed)
Printed and gave the order to libby to precert again then we will call him to schedule

## 2018-06-11 NOTE — Telephone Encounter (Signed)
Spoke with patient. He is aware of the pre-cert process. He will await a phone call when this is done and he is ready to be scheduled.   Nothing else needed at time of call.

## 2018-06-29 DIAGNOSIS — G4733 Obstructive sleep apnea (adult) (pediatric): Secondary | ICD-10-CM | POA: Diagnosis not present

## 2018-06-30 ENCOUNTER — Telehealth: Payer: Self-pay | Admitting: Pulmonary Disease

## 2018-06-30 DIAGNOSIS — G4733 Obstructive sleep apnea (adult) (pediatric): Secondary | ICD-10-CM

## 2018-06-30 NOTE — Telephone Encounter (Signed)
HST 06/29/18 >> AHI 18.1, SaO2 low 87%.   Will have my nurse inform pt that sleep study shows moderate sleep apnea.  Options are 1) CPAP now, 2) ROV first.  If He is agreeable to CPAP, then please send order for auto CPAP range 5 to 15 cm H2O with heated humidity and mask of choice.  Have download sent 1 month after starting CPAP and set up ROV 2 months after starting CPAP.  ROV can be with me or NP.

## 2018-07-01 ENCOUNTER — Other Ambulatory Visit: Payer: Self-pay | Admitting: *Deleted

## 2018-07-01 DIAGNOSIS — G4733 Obstructive sleep apnea (adult) (pediatric): Secondary | ICD-10-CM

## 2018-07-02 ENCOUNTER — Encounter: Payer: Self-pay | Admitting: *Deleted

## 2018-07-02 NOTE — Telephone Encounter (Signed)
Pt is returning call. Cb is [phone].

## 2018-07-02 NOTE — Telephone Encounter (Signed)
Attempted to call patient today regarding results. I did not receive an answer at time of call. I have left a voicemail message for pt to return call. X1  

## 2018-07-02 NOTE — Telephone Encounter (Signed)
Called and spoke with patient regarding results.  Informed the patient of results and recommendations today. Pt agreed to be set up with cpap machine auto 5-15cm, mask of choice and supplies Placed order today, schedule 63mo f/u with VS for 08/27/18 at 4pm Pt verbalized understanding and denied any questions or concerns at this time.  Nothing further needed.

## 2018-07-28 DIAGNOSIS — G4733 Obstructive sleep apnea (adult) (pediatric): Secondary | ICD-10-CM | POA: Diagnosis not present

## 2018-08-27 ENCOUNTER — Ambulatory Visit: Payer: BLUE CROSS/BLUE SHIELD | Admitting: Pulmonary Disease

## 2018-09-01 ENCOUNTER — Other Ambulatory Visit: Payer: Self-pay | Admitting: Internal Medicine

## 2018-09-16 ENCOUNTER — Telehealth: Payer: Self-pay | Admitting: Pulmonary Disease

## 2018-09-16 NOTE — Telephone Encounter (Signed)
rec'd email from [facility] today regarding pt's cpap machine Pt came into [facility] and dropped off machine and supplies  Pt sates he used it 4-5 times since set up on 07-28-2018, doesn't want it anymore  Pt has an appointment with VS on 09-20-18, should he keep the appt?  VS please advise.

## 2018-09-17 NOTE — Telephone Encounter (Signed)
Okay to cancel appointment

## 2018-09-20 ENCOUNTER — Encounter: Payer: Self-pay | Admitting: Internal Medicine

## 2018-09-20 ENCOUNTER — Encounter: Payer: Self-pay | Admitting: Pulmonary Disease

## 2018-09-20 ENCOUNTER — Ambulatory Visit (INDEPENDENT_AMBULATORY_CARE_PROVIDER_SITE_OTHER): Payer: BLUE CROSS/BLUE SHIELD | Admitting: Pulmonary Disease

## 2018-09-20 VITALS — BP 120/64 | HR 76 | Ht 73.0 in | Wt 215.0 lb

## 2018-09-20 DIAGNOSIS — G4733 Obstructive sleep apnea (adult) (pediatric): Secondary | ICD-10-CM

## 2018-09-20 NOTE — Progress Notes (Signed)
  Visit scheduled in error 

## 2018-10-05 NOTE — Patient Instructions (Addendum)
Try taking metacmucil or benefiber daily.  Tests ordered today. Your results will be released to MyChart (or called to you) after review, usually within 72hours after test completion. If any changes need to be made, you will be notified at that same time.  All other Health Maintenance issues reviewed.   All recommended immunizations and age-appropriate screenings are up-to-date or discussed.  Shingles immunizations administered today.   Medications reviewed and updated.  Changes include :   Take protonix 40 mg 30 minutes prior to breakfast and dinner.  Stop the famotidine for now.    Your prescription(s) have been submitted to your pharmacy. Please take as directed and contact our office if you believe you are having problem(s) with the medication(s).  A referral was ordered for Dermatology  Please followup in 6 months    Health Maintenance, Male A healthy lifestyle and preventive care is important for your health and wellness. Ask your health care provider about what schedule of regular examinations is right for you. What should I know about weight and diet? Eat a Healthy Diet  Eat plenty of vegetables, fruits, whole grains, low-fat dairy products, and lean protein.  Do not eat a lot of foods high in solid fats, added sugars, or salt.  Maintain a Healthy Weight Regular exercise can help you achieve or maintain a healthy weight. You should:  Do at least 150 minutes of exercise each week. The exercise should increase your heart rate and make you sweat (moderate-intensity exercise).  Do strength-training exercises at least twice a week.  Watch Your Levels of Cholesterol and Blood Lipids  Have your blood tested for lipids and cholesterol every 5 years starting at 50 years of age. If you are at high risk for heart disease, you should start having your blood tested when you are 50 years old. You may need to have your cholesterol levels checked more often if: ? Your lipid or  cholesterol levels are high. ? You are older than 50 years of age. ? You are at high risk for heart disease.  What should I know about cancer screening? Many types of cancers can be detected early and may often be prevented. Lung Cancer  You should be screened every year for lung cancer if: ? You are a current smoker who has smoked for at least 30 years. ? You are a former smoker who has quit within the past 15 years.  Talk to your health care provider about your screening options, when you should start screening, and how often you should be screened.  Colorectal Cancer  Routine colorectal cancer screening usually begins at 50 years of age and should be repeated every 5-10 years until you are 50 years old. You may need to be screened more often if early forms of precancerous polyps or small growths are found. Your health care provider may recommend screening at an earlier age if you have risk factors for colon cancer.  Your health care provider may recommend using home test kits to check for hidden blood in the stool.  A small camera at the end of a tube can be used to examine your colon (sigmoidoscopy or colonoscopy). This checks for the earliest forms of colorectal cancer.  Prostate and Testicular Cancer  Depending on your age and overall health, your health care provider may do certain tests to screen for prostate and testicular cancer.  Talk to your health care provider about any symptoms or concerns you have about testicular or prostate cancer.  Skin Cancer  Check your skin from head to toe regularly.  Tell your health care provider about any new moles or changes in moles, especially if: ? There is a change in a mole's size, shape, or color. ? You have a mole that is larger than a pencil eraser.  Always use sunscreen. Apply sunscreen liberally and repeat throughout the day.  Protect yourself by wearing long sleeves, pants, a wide-brimmed hat, and sunglasses when  outside.  What should I know about heart disease, diabetes, and high blood pressure?  If you are 59-100 years of age, have your blood pressure checked every 3-5 years. If you are 42 years of age or older, have your blood pressure checked every year. You should have your blood pressure measured twice-once when you are at a hospital or clinic, and once when you are not at a hospital or clinic. Record the average of the two measurements. To check your blood pressure when you are not at a hospital or clinic, you can use: ? An automated blood pressure machine at a pharmacy. ? A home blood pressure monitor.  Talk to your health care provider about your target blood pressure.  If you are between 59-63 years old, ask your health care provider if you should take aspirin to prevent heart disease.  Have regular diabetes screenings by checking your fasting blood sugar level. ? If you are at a normal weight and have a low risk for diabetes, have this test once every three years after the age of 64. ? If you are overweight and have a high risk for diabetes, consider being tested at a younger age or more often.  A one-time screening for abdominal aortic aneurysm (AAA) by ultrasound is recommended for men aged 65-75 years who are current or former smokers. What should I know about preventing infection? Hepatitis B If you have a higher risk for hepatitis B, you should be screened for this virus. Talk with your health care provider to find out if you are at risk for hepatitis B infection. Hepatitis C Blood testing is recommended for:  Everyone born from [birth year] through 1963.  Anyone with known risk factors for hepatitis C.  Sexually Transmitted Diseases (STDs)  You should be screened each year for STDs including gonorrhea and chlamydia if: ? You are sexually active and are younger than 50 years of age. ? You are older than 50 years of age and your health care provider tells you that you are at risk for this  type of infection. ? Your sexual activity has changed since you were last screened and you are at an increased risk for chlamydia or gonorrhea. Ask your health care provider if you are at risk.  Talk with your health care provider about whether you are at high risk of being infected with HIV. Your health care provider may recommend a prescription medicine to help prevent HIV infection.  What else can I do?  Schedule regular health, dental, and eye exams.  Stay current with your vaccines (immunizations).  Do not use any tobacco products, such as cigarettes, chewing tobacco, and e-cigarettes. If you need help quitting, ask your health care provider.  Limit alcohol intake to no more than 2 drinks per day. One drink equals 12 ounces of beer, 5 ounces of wine, or 1 ounces of hard liquor.  Do not use street drugs.  Do not share needles.  Ask your health care provider for help if you need support or information about quitting  drugs.  Tell your health care provider if you often feel depressed.  Tell your health care provider if you have ever been abused or do not feel safe at home. This information is not intended to replace advice given to you by your health care provider. Make sure you discuss any questions you have with your health care provider. Document Released: 05/22/2008 Document Revised: 07/23/2016 Document Reviewed: 08/28/2015 Elsevier Interactive Patient Education  [employer].

## 2018-10-05 NOTE — Progress Notes (Signed)
Subjective:    Patient ID: [name], male    DOB: 1966/02/17, 52 y.o.   MRN: [id number]  HPI He is here for a physical exam.   He has chronic abdominal pain, alternating constipation and diarrhea.  Typically after he eats he has diarrhea.  He has a colonoscopy scheduled for next month.  He has a lot of burping, and burning sensation.    He gets about 5 hrs of sleep at night.  He does have OSA and has not tolerated cpap.    He takes 2-3 ibuprofen before going to bed.    Medications and allergies reviewed with patient and updated if appropriate.  Patient Active Problem List   Diagnosis Date Noted  . Arthralgia 06/01/2017  . Other chest pain 06/01/2017  . Fatigue 06/01/2017  . Diabetes (HCC) 10/01/2016  . Meralgia paresthetica of right side 01/25/2016  . Vitamin D deficiency 01/25/2016  . SBO (small bowel obstruction) (HCC) 01/02/2016  . Allergic rhinitis 02/07/2011  . OBSTRUCTIVE SLEEP APNEA 10/23/2010  . GERD 03/12/2010  . Irritable bowel syndrome 03/12/2010  . DIARRHEA, RECURRENT 02/05/2010    Current Outpatient Medications on File Prior to Visit  Medication Sig Dispense Refill  . cetirizine (ZYRTEC) 10 MG tablet Take 10 mg by mouth daily as needed for allergies.    Marland Kitchen dicyclomine (BENTYL) 20 MG tablet Take 1 tablet (20 mg total) by mouth 2 (two) times daily. 20 tablet 0  . Cholecalciferol (VITAMIN D3) 2000 units capsule Take by mouth.     No current facility-administered medications on file prior to visit.     Past Medical History:  Diagnosis Date  . Chest wall pain   . Cough   . Diabetes (HCC)   . Diarrhea recurrent  . GERD (gastroesophageal reflux disease)   . Headache(784.0)   . Irritable bowel syndrome   . Obstructive sleep apnea    apnealink 10/23/10 AHI 5, Sp02 low 87%  . Postural lightheadedness   . SBO (small bowel obstruction) (HCC) 12/2015  . Small bowel obstruction [facility])     Past Surgical History:  Procedure Laterality Date  .  CHOLECYSTECTOMY  2009  . COLECTOMY  1994   segmental  . COLONOSCOPY  07/17/2011   hemorrhoids, otherwise normal including terminal ileum and random colon biopises  . GASTROSTOMY  2005   stamm  . LAPAROTOMY  2002, 2005, 2009  . SPHINCTEROTOMY  2009   lateral internal  . UPPER GASTROINTESTINAL ENDOSCOPY  07/17/2011   normal, including duodenal biopsies    Social History   Socioeconomic History  . Marital status: Married    Spouse name: Not on file  . Number of children: Not on file  . Years of education: Not on file  . Highest education level: Not on file  Occupational History  . Not on file  Social Needs  . Financial resource strain: Not on file  . Food insecurity:    Worry: Not on file    Inability: Not on file  . Transportation needs:    Medical: Not on file    Non-medical: Not on file  Tobacco Use  . Smoking status: Never Smoker  . Smokeless tobacco: Never Used  Substance and Sexual Activity  . Alcohol use: No  . Drug use: No  . Sexual activity: Not on file  Lifestyle  . Physical activity:    Days per week: Not on file    Minutes per session: Not on file  . Stress: Not on  file  Relationships  . Social connections:    Talks on phone: Not on file    Gets together: Not on file    Attends religious service: Not on file    Active member of club or organization: Not on file    Attends meetings of clubs or organizations: Not on file    Relationship status: Not on file  Other Topics Concern  . Not on file  Social History Narrative   Occupation: Group Engineer, petroleum (deliveries)   Patient has never smoked.   Alcohol Use-no   Illicit Drug Use-no   Married-Divorced-Married again 2009-    3 children 2 sons and 1 daughter      No regular exercise    Family History  Problem Relation Age of Onset  . Liver disease Father        liver cancer???  . Hypertension Mother   . Pancreatic cancer Sister     Review of Systems  Constitutional: Negative for chills  and fever.  Eyes: Negative for visual disturbance.  Respiratory: Negative for cough, shortness of breath and wheezing.   Cardiovascular: Positive for palpitations (occ). Negative for chest pain.  Gastrointestinal: Positive for abdominal pain, constipation, diarrhea and nausea. Negative for blood in stool.       Increased burping, burning and gas  Genitourinary: Negative for dysuria and hematuria.  Musculoskeletal: Positive for arthralgias (knee pain - seeing orthopedics).  Skin: Negative for rash.       Dry skin around fingernails  Neurological: Positive for headaches (occ). Negative for light-headedness.  Psychiatric/Behavioral: Negative for dysphoric mood. The patient is not nervous/anxious.        Objective:   Vitals:   10/06/18 0752  BP: 118/70  Pulse: (!) 55  Resp: 16  Temp: 98.2 F (36.8 C)  SpO2: 98%   Filed Weights   10/06/18 0752  Weight: 211 lb 1.9 oz (95.8 kg)   Body mass index is 27.85 kg/m.  Wt Readings from Last 3 Encounters:  10/06/18 211 lb 1.9 oz (95.8 kg)  09/20/18 215 lb (97.5 kg)  05/13/18 209 lb (94.8 kg)     Physical Exam Constitutional: He appears well-developed and well-nourished. No distress.  HENT:  Head: Normocephalic and atraumatic.  Right Ear: External ear normal.  Left Ear: External ear normal.  Mouth/Throat: Oropharynx is clear and moist.  Normal ear canals and TM b/l  Eyes: Conjunctivae and EOM are normal.  Neck: Neck supple. No tracheal deviation present. No thyromegaly present.  No carotid bruit  Cardiovascular: Normal rate, regular rhythm, normal heart sounds and intact distal pulses.   No murmur heard. Pulmonary/Chest: Effort normal and breath sounds normal. No respiratory distress. He has no wheezes. He has no rales.  Abdominal: Soft. Surgical scars from prior surgery - mid-abdominal hernia 0- non tender, reducible, He exhibits no distension. There is no tenderness.  Genitourinary: deferred  Musculoskeletal: He exhibits no  edema.  Lymphadenopathy:   He has no cervical adenopathy.  Skin: Skin is warm and dry. He is not diaphoretic.  Psychiatric: He has a normal mood and affect. His behavior is normal.    Diabetic Foot Exam - Simple   Simple Foot Form Diabetic Foot exam was performed with the following findings:  Yes 10/06/2018  8:41 AM  Visual Inspection No deformities, no ulcerations, no other skin breakdown bilaterally:  Yes Sensation Testing Intact to touch and monofilament testing bilaterally:  Yes Pulse Check Posterior Tibialis and Dorsalis pulse intact bilaterally:  Yes Comments  Assessment & Plan:   Physical exam: Screening blood work  ordered Immunizations   Up to date  Colonoscopy  Last scope was 7 years ago - due  - schedule in November Eye exams due - will schedule EKG      Done 04/2018 Exercise   Active at work, no other exercise Weight  Slightly overweight - encouraged weight loss Skin  Dry skin near finger nails - interested in seeing derm - referred Substance abuse   none  See Problem List for Assessment and Plan of chronic medical problems.    FU in one year

## 2018-10-06 ENCOUNTER — Other Ambulatory Visit (INDEPENDENT_AMBULATORY_CARE_PROVIDER_SITE_OTHER): Payer: BLUE CROSS/BLUE SHIELD

## 2018-10-06 ENCOUNTER — Ambulatory Visit (INDEPENDENT_AMBULATORY_CARE_PROVIDER_SITE_OTHER): Payer: BLUE CROSS/BLUE SHIELD | Admitting: Internal Medicine

## 2018-10-06 ENCOUNTER — Encounter: Payer: Self-pay | Admitting: Internal Medicine

## 2018-10-06 VITALS — BP 118/70 | HR 55 | Temp 98.2°F | Resp 16 | Ht 73.0 in | Wt 211.1 lb

## 2018-10-06 DIAGNOSIS — K439 Ventral hernia without obstruction or gangrene: Secondary | ICD-10-CM | POA: Insufficient documentation

## 2018-10-06 DIAGNOSIS — K219 Gastro-esophageal reflux disease without esophagitis: Secondary | ICD-10-CM

## 2018-10-06 DIAGNOSIS — Z0001 Encounter for general adult medical examination with abnormal findings: Secondary | ICD-10-CM | POA: Diagnosis not present

## 2018-10-06 DIAGNOSIS — Z23 Encounter for immunization: Secondary | ICD-10-CM

## 2018-10-06 DIAGNOSIS — G4733 Obstructive sleep apnea (adult) (pediatric): Secondary | ICD-10-CM

## 2018-10-06 DIAGNOSIS — E119 Type 2 diabetes mellitus without complications: Secondary | ICD-10-CM

## 2018-10-06 DIAGNOSIS — L989 Disorder of the skin and subcutaneous tissue, unspecified: Secondary | ICD-10-CM

## 2018-10-06 DIAGNOSIS — K582 Mixed irritable bowel syndrome: Secondary | ICD-10-CM

## 2018-10-06 LAB — COMPREHENSIVE METABOLIC PANEL
ALBUMIN: 4.5 g/dL (ref 3.5–5.2)
ALT: 46 U/L (ref 0–53)
AST: 39 U/L — AB (ref 0–37)
Alkaline Phosphatase: 60 U/L (ref 39–117)
BUN: 12 mg/dL (ref 6–23)
CHLORIDE: 101 meq/L (ref 96–112)
CO2: 28 meq/L (ref 19–32)
CREATININE: 1.04 mg/dL (ref 0.40–1.50)
Calcium: 9.7 mg/dL (ref 8.4–10.5)
GFR: 80.08 mL/min (ref >60.00)
GLUCOSE: 144 mg/dL — AB (ref 70–99)
Potassium: 4.2 mEq/L (ref 3.5–5.1)
SODIUM: 137 meq/L (ref 135–145)
Total Bilirubin: 1.1 mg/dL (ref 0.2–1.2)
Total Protein: 7.6 g/dL (ref 6.0–8.3)

## 2018-10-06 LAB — CBC WITH DIFFERENTIAL/PLATELET
BASOS PCT: 0.9 % (ref 0.0–3.0)
Basophils Absolute: 0 10*3/uL (ref 0.0–0.1)
EOS ABS: 0.1 10*3/uL (ref 0.0–0.7)
Eosinophils Relative: 2.4 % (ref 0.0–5.0)
HCT: 44.3 % (ref 39.0–52.0)
Hemoglobin: 15.3 g/dL (ref 13.0–17.0)
LYMPHS ABS: 2.8 10*3/uL (ref 0.7–4.0)
Lymphocytes Relative: 56.3 % — ABNORMAL HIGH (ref 12.0–46.0)
MCHC: 34.6 g/dL (ref 30.0–36.0)
MCV: 83.5 fl (ref 78.0–100.0)
MONO ABS: 0.5 10*3/uL (ref 0.1–1.0)
Monocytes Relative: 9 % (ref 3.0–12.0)
NEUTROS ABS: 1.6 10*3/uL (ref 1.4–7.7)
Neutrophils Relative %: 31.4 % — ABNORMAL LOW (ref 43.0–77.0)
PLATELETS: 223 10*3/uL (ref 150.0–400.0)
RBC: 5.31 Mil/uL (ref 4.22–5.81)
RDW: 13.3 % (ref 11.5–15.5)
WBC: 5.1 10*3/uL (ref 4.0–10.5)

## 2018-10-06 LAB — LIPID PANEL
CHOL/HDL RATIO: 4
CHOLESTEROL: 125 mg/dL (ref 0–200)
HDL: 34.6 mg/dL — ABNORMAL LOW (ref >39.00)
LDL CALC: 62 mg/dL (ref 0–99)
NONHDL: 90.22
Triglycerides: 140 mg/dL (ref 0.0–149.0)
VLDL: 28 mg/dL (ref 0.0–40.0)

## 2018-10-06 LAB — HEMOGLOBIN A1C: HEMOGLOBIN A1C: 6.9 % — AB (ref 4.6–6.5)

## 2018-10-06 MED ORDER — PANTOPRAZOLE SODIUM 40 MG PO TBEC: 40.0000 mg | DELAYED_RELEASE_TABLET | Freq: Two times a day (BID) | ORAL | 1 refills | Status: DC

## 2018-10-06 NOTE — Assessment & Plan Note (Signed)
Diet controlled Check A1c Active, but not exercising regularly-encourage more regular exercise Encourage weight loss Follow-up in 6 months

## 2018-10-06 NOTE — Assessment & Plan Note (Signed)
Related to prior surgery Nontender, reducible Monitor only

## 2018-10-06 NOTE — Assessment & Plan Note (Signed)
Chronic abdominal pain Alternating bowels between constipation and diarrhea-constipation predominant Advised trying Metamucil or Benefiber daily Has follow-up with GI and colonoscopy next month

## 2018-10-06 NOTE — Assessment & Plan Note (Signed)
Moderate cpap Did tolerate cpap

## 2018-10-06 NOTE — Addendum Note (Signed)
Addended by: Mercer Pod E on: 10/06/2018 09:12 AM   Modules accepted: Orders

## 2018-10-06 NOTE — Assessment & Plan Note (Addendum)
Not controlled -having increased burping, burning sensation reflux Taking pantoprazole 40 mg in the morning and Pepcid 20 mg at night Will discontinue Pepcid and increase pantoprazole to twice daily Has GI follow-up next month Reviewed GERD diet Encouraged weight loss Taking ibuprofen in the evening/bedtime-advised only to take Tylenol

## 2018-10-07 DIAGNOSIS — M25851 Other specified joint disorders, right hip: Secondary | ICD-10-CM | POA: Diagnosis not present

## 2018-10-07 DIAGNOSIS — M25852 Other specified joint disorders, left hip: Secondary | ICD-10-CM | POA: Diagnosis not present

## 2018-10-07 DIAGNOSIS — M25552 Pain in left hip: Secondary | ICD-10-CM | POA: Diagnosis not present

## 2018-10-07 DIAGNOSIS — M25551 Pain in right hip: Secondary | ICD-10-CM | POA: Diagnosis not present

## 2018-10-07 LAB — PSA, TOTAL AND FREE
PSA, % FREE: 67 % (ref >25)
PSA, FREE: 0.2 ng/mL
PSA, Total: 0.3 ng/mL (ref <=4.0)

## 2018-10-19 ENCOUNTER — Encounter: Payer: Self-pay | Admitting: Internal Medicine

## 2018-10-19 ENCOUNTER — Ambulatory Visit (AMBULATORY_SURGERY_CENTER): Payer: Self-pay

## 2018-10-19 VITALS — Ht 73.0 in | Wt 218.0 lb

## 2018-10-19 DIAGNOSIS — Z1211 Encounter for screening for malignant neoplasm of colon: Secondary | ICD-10-CM

## 2018-10-19 NOTE — Progress Notes (Signed)
Denies allergies to eggs or soy products. Denies complication of anesthesia or sedation. Denies use of weight loss medication. Denies use of O2.   Emmi instructions declined.   Extra time was spent explaining instructions for prepping for his colonoscopy. Patient verbalizes understanding.

## 2018-10-28 ENCOUNTER — Ambulatory Visit (AMBULATORY_SURGERY_CENTER): Payer: BLUE CROSS/BLUE SHIELD | Admitting: Internal Medicine

## 2018-10-28 ENCOUNTER — Encounter: Payer: Self-pay | Admitting: Internal Medicine

## 2018-10-28 ENCOUNTER — Encounter: Payer: BLUE CROSS/BLUE SHIELD | Admitting: Internal Medicine

## 2018-10-28 VITALS — BP 111/79 | HR 84 | Temp 96.8°F | Resp 17 | Ht 73.0 in | Wt 211.0 lb

## 2018-10-28 DIAGNOSIS — Z1211 Encounter for screening for malignant neoplasm of colon: Secondary | ICD-10-CM | POA: Diagnosis not present

## 2018-10-28 MED ORDER — SUCRALFATE 1 G PO TABS: 1.0000 g | ORAL_TABLET | Freq: Three times a day (TID) | ORAL | 2 refills | Status: DC

## 2018-10-28 MED ORDER — SODIUM CHLORIDE 0.9 % IV SOLN: 500.0000 mL | Freq: Once | INTRAVENOUS | Status: DC

## 2018-10-28 NOTE — Progress Notes (Signed)
Pt's states no medical or surgical changes since previsit or office visit. 

## 2018-10-28 NOTE — Patient Instructions (Addendum)
No polyps or cancer seen.  Next routine colonoscopy or other screening test in 10 years - 2029  I prescribed sucralfate to see if that helps heartburn. [facility] [address].  If that does not help you enough please make an appointment to see me in the office.  I appreciate the opportunity to care for you. Iva Booparl E. Jaelie Aguilera, MD, FACG   YOU HAD AN ENDOSCOPIC PROCEDURE TODAY AT THE [facility]:   Refer to the procedure report that was given to you for any specific questions about what was found during the examination.  If the procedure report does not answer your questions, please call your gastroenterologist to clarify.  If you requested that your care partner not be given the details of your procedure findings, then the procedure report has been included in a sealed envelope for you to review at your convenience later.  YOU SHOULD EXPECT: Some feelings of bloating in the abdomen. Passage of more gas than usual.  Walking can help get rid of the air that was put into your GI tract during the procedure and reduce the bloating. If you had a lower endoscopy (such as a colonoscopy or flexible sigmoidoscopy) you may notice spotting of blood in your stool or on the toilet paper. If you underwent a bowel prep for your procedure, you may not have a normal bowel movement for a few days.  Please Note:  You might notice some irritation and congestion in your nose or some drainage.  This is from the oxygen used during your procedure.  There is no need for concern and it should clear up in a day or so.  SYMPTOMS TO REPORT IMMEDIATELY:   Following lower endoscopy (colonoscopy or flexible sigmoidoscopy):  Excessive amounts of blood in the stool  Significant tenderness or worsening of abdominal pains  Swelling of the abdomen that is new, acute  Fever of 100F or higher  For urgent or emergent issues, a gastroenterologist can be reached at any hour by calling [phone].   DIET:  We do  recommend a small meal at first, but then you may proceed to your regular diet.  Drink plenty of fluids but you should avoid alcoholic beverages for 24 hours.  ACTIVITY:  You should plan to take it easy for the rest of today and you should NOT DRIVE or use heavy machinery until tomorrow (because of the sedation medicines used during the test).    FOLLOW UP: Our staff will call the number listed on your records the next business day following your procedure to check on you and address any questions or concerns that you may have regarding the information given to you following your procedure. If we do not reach you, we will leave a message.  However, if you are feeling well and you are not experiencing any problems, there is no need to return our call.  We will assume that you have returned to your regular daily activities without incident.  If any biopsies were taken you will be contacted by phone or by letter within the next 1-3 weeks.  Please call us at [phone] if you have not heard about the biopsies in 3 weeks.    SIGNATURES/CONFIDENTIALITY: You and/or your care partner have signed paperwork which will be entered into your electronic medical record.  These signatures attest to the fact that that the information above on your After Visit Summary has been reviewed and is understood.  Full responsibility of the confidentiality of  this discharge information lies with you and/or your care-partner.

## 2018-10-28 NOTE — Op Note (Addendum)
[facility] Patient Name: [name] Procedure Date: 10/28/2018 4:27 PM MRN: [id number] Endoscopist: Iva Booparl E Clee Pandit , MD Age: 52 Referring MD:  Date of Birth: 11/02/1966 Gender: Male Account #: [id number] Procedure:                Colonoscopy Indications:              Screening for colorectal malignant neoplasm Medicines:                Propofol per Anesthesia, Monitored Anesthesia Care Procedure:                Pre-Anesthesia Assessment:                           - Prior to the procedure, a History and Physical                            was performed, and patient medications and                            allergies were reviewed. The patient's tolerance of                            previous anesthesia was also reviewed. The risks                            and benefits of the procedure and the sedation                            options and risks were discussed with the patient.                            All questions were answered, and informed consent                            was obtained. Prior Anticoagulants: The patient has                            taken no previous anticoagulant or antiplatelet                            agents. ASA Grade Assessment: II - A patient with                            mild systemic disease. After reviewing the risks                            and benefits, the patient was deemed in                            satisfactory condition to undergo the procedure.                           After obtaining informed consent, the colonoscope  was passed under direct vision. Throughout the                            procedure, the patient's blood pressure, pulse, and                            oxygen saturations were monitored continuously. The                            Colonoscope was introduced through the anus and                            advanced to the the cecum, identified by   appendiceal orifice and ileocecal valve. The                            ileocecal valve, appendiceal orifice, and rectum                            were photographed. The quality of the bowel                            preparation was excellent. The colonoscopy was                            performed without difficulty. The patient tolerated                            the procedure well. The bowel preparation used was                            Miralax. Scope In: 4:33:44 PM Scope Out: 4:49:36 PM Scope Withdrawal Time: 0 hours 7 minutes 9 seconds  Total Procedure Duration: 0 hours 15 minutes 52 seconds  Findings:                 The perianal and digital rectal examinations were                            normal. Pertinent negatives include normal prostate                            (size, shape, and consistency).                           The colon (entire examined portion) appeared normal.                           No additional abnormalities were found on                            retroflexion. Complications:            No immediate complications. Estimated blood loss:  None. Estimated Blood Loss:     Estimated blood loss: none. Recommendation:           - Repeat colonoscopy in 10 years for screening                            purposes.                           - Patient has a contact number available for                            emergencies. The signs and symptoms of potential                            delayed complications were discussed with the                            patient. Return to normal activities tomorrow.                            Written discharge instructions were provided to the                            patient.                           - Resume previous diet.                           - Continue present medications. Iva Boop, MD 10/28/2018 5:01:23 PM This report has been signed electronically.

## 2018-10-28 NOTE — Progress Notes (Signed)
PT taken to PACU. Monitors in place. VSS. Report given to RN. 

## 2018-10-29 ENCOUNTER — Telehealth: Payer: Self-pay | Admitting: *Deleted

## 2018-10-29 ENCOUNTER — Telehealth: Payer: Self-pay

## 2018-10-29 NOTE — Telephone Encounter (Signed)
  Follow up Call-  Call back number 10/28/2018  Post procedure Call Back phone  # [phone]  Permission to leave phone message Yes  Some recent data might be hidden     Patient questions:  Message left to call us if necessary.

## 2018-10-29 NOTE — Telephone Encounter (Signed)
Hit telephone note by accident, no call was made.

## 2018-10-29 NOTE — Telephone Encounter (Signed)
  Follow up Call-  Call back number 10/28/2018  Post procedure Call Back phone  # [phone]  Permission to leave phone message Yes  Some recent data might be hidden     Patient questions:  Do you have a fever, pain , or abdominal swelling? No. Pain Score  0 *  Have you tolerated food without any problems? Yes.    Have you been able to return to your normal activities? Yes.    Do you have any questions about your discharge instructions: Diet   No. Medications  No. Follow up visit  No.  Do you have questions or concerns about your Care? No.  Actions: * If pain score is 4 or above: No action needed, pain <4.

## 2018-11-09 DIAGNOSIS — M25851 Other specified joint disorders, right hip: Secondary | ICD-10-CM | POA: Diagnosis not present

## 2018-11-09 DIAGNOSIS — M25852 Other specified joint disorders, left hip: Secondary | ICD-10-CM | POA: Diagnosis not present

## 2018-11-17 DIAGNOSIS — M25562 Pain in left knee: Secondary | ICD-10-CM | POA: Diagnosis not present

## 2019-02-09 ENCOUNTER — Other Ambulatory Visit: Payer: Self-pay | Admitting: Internal Medicine

## 2019-02-23 ENCOUNTER — Other Ambulatory Visit: Payer: Self-pay | Admitting: Internal Medicine

## 2019-03-07 NOTE — Progress Notes (Signed)
Virtual Visit via Video Note  I connected with [name] on 03/07/19 at  9:00 AM EDT by a video enabled telemedicine application and verified that I am speaking with the correct person using two identifiers.   I discussed the limitations of evaluation and management by telemedicine and the availability of in person appointments. The patient expressed understanding and agreed to proceed.  The patient is currently at home and I am in the office.    No referring provider.    History of Present Illness: This visit is for an acute problem: body aches and fatigue.   He states his symptoms started about 3 days ago.  He states diffuse body aches, fatigue and some headaches.  He does have an occasional sneeze and thinks that is from allergies.  He has had a mild sore throat and did do some salt water gargles.  At one point his bottom gum by his wisdom teeth was sore and felt different and he thought there might be an infection, but he thinks that may have gotten better.  He did have mild diarrhea for a couple of days.  He is also noted some increased abdominal gas.  He denies any fevers, chills cough, wheeze, shortness of breath, nasal congestion, chest pain, palpitations, nausea, abdominal pain, dysuria and hematuria.  He has been taking ibuprofen, which has helped with his body aches.  He continues to take his pantoprazole daily and he is taking his Zyrtec.  He feels like he is fighting an infection.   Observations/Objective: Appears well, NAD  Assessment and Plan:  See Problem List for Assessment and Plan of chronic medical problems.  Follow Up Instructions:    I discussed the assessment and treatment plan with the patient. The patient was provided an opportunity to ask questions and all were answered. The patient agreed with the plan and demonstrated an understanding of the instructions.   The patient was advised to call back or seek an in-person evaluation if the symptoms worsen  or if the condition fails to improve as anticipated.    [name], MD

## 2019-03-08 ENCOUNTER — Encounter: Payer: Self-pay | Admitting: Internal Medicine

## 2019-03-08 ENCOUNTER — Ambulatory Visit (INDEPENDENT_AMBULATORY_CARE_PROVIDER_SITE_OTHER): Payer: BLUE CROSS/BLUE SHIELD | Admitting: Internal Medicine

## 2019-03-08 DIAGNOSIS — B349 Viral infection, unspecified: Secondary | ICD-10-CM | POA: Diagnosis not present

## 2019-03-08 NOTE — Assessment & Plan Note (Signed)
He is experiencing body aches, fatigue, mild headaches, diarrhea for couple days Symptoms suggestive of possible mild viral infection No concerning symptoms Advised him to continue ibuprofen, but to limit this because of his GERD.  Can take Tylenol Continue increased fluids, rest No other treatment needed at this time He will let me know if his symptoms change or worsen over the next few days He knows he can call with any questions or concerns.

## 2019-03-10 ENCOUNTER — Ambulatory Visit: Payer: Self-pay

## 2019-03-10 NOTE — Telephone Encounter (Signed)
  Pt c/o severe body aches and a burning feeling to his lungs. Pt has NO fever, cough. Pt stated he woke up this morning at 1am with SOB. Pt stated he took cough syrup and went back to sleep 3 hours later. Pt has been taking 800 mg Ibuprofen every 5-6 hours nd now has stomach discomfort. Pt advised to take no more than 6 -200 mg pills in a 24 hour period and to take with food or water.  Pt c/o fatigue.  Pt was evaluated for body aches and fatigue 03/08/19. Since then SOB is a new symptom. Pt thinks it is a lung infection.  Care advice given and pt verbalized understanding. Email address is correct in chart. Pt requesting a appt, routing to [facility].         Reason for Disposition . [1] MILD difficulty breathing (e.g., minimal/no SOB at rest, SOB with walking, pulse <100) AND [2] NEW-onset or WORSE than normal  Answer Assessment - Initial Assessment Questions 1. RESPIRATORY STATUS: "Describe your breathing?" (e.g., wheezing, shortness of breath, unable to speak, severe coughing)      Lungs feel like they are burning, SOB worse starting last night- couldn't sleep 2. ONSET: "When did this breathing problem begin?"      Last Thursday 3. PATTERN "Does the difficult breathing come and go, or has it been constant since it started?"      SOB 1- 4 am comes and goes 4. SEVERITY: "How bad is your breathing?" (e.g., mild, moderate, severe)    - MILD: No SOB at rest, mild SOB with walking, speaks normally in sentences, can lay down, no retractions, pulse < 100.    - MODERATE: SOB at rest, SOB with minimal exertion and prefers to sit, cannot lie down flat, speaks in phrases, mild retractions, audible wheezing, pulse 100-120.    - SEVERE: Very SOB at rest, speaks in single words, struggling to breathe, sitting hunched forward, retractions, pulse > 120      mild 5. RECURRENT SYMPTOM: "Have you had difficulty breathing before?" If so, ask: "When was the last time?" and "What happened that time?"      no 6.  CARDIAC HISTORY: "Do you have any history of heart disease?" (e.g., heart attack, angina, bypass surgery, angioplasty)      no 7. LUNG HISTORY: "Do you have any history of lung disease?"  (e.g., pulmonary embolus, asthma, emphysema)     no 8. CAUSE: "What do you think is causing the breathing problem?"      Lung infection 9. OTHER SYMPTOMS: "Do you have any other symptoms? (e.g., dizziness, runny nose, cough, chest pain, fever)     Body aches, stomach discomfort, fatigue 10. PREGNANCY: "Is there any chance you are pregnant?" "When was your last menstrual period?"       n/a 11. TRAVEL: "Have you traveled out of the country in the last month?" (e.g., travel history, exposures)       no  Protocols used: BREATHING DIFFICULTY-A-AH

## 2019-03-10 NOTE — Progress Notes (Signed)
Virtual Visit via Video Note  I connected with [name] on 03/10/19 at  1:00 PM EDT by a video enabled telemedicine application and verified that I am speaking with the correct person using two identifiers.   I discussed the limitations of evaluation and management by telemedicine and the availability of in person appointments. The patient expressed understanding and agreed to proceed.  The patient is currently at home and I am in the office.    No referring provider.    History of Present Illness: This visit is an acute visit.  We had a virtual visit 3 days ago when symptoms were consistent with a viral illness.  He continues to have body aches, fatigue.  He is now concerned about a burning sensation across his chest and uncomfortable breathing.  He states the uncomfortable breathing is at nighttime, not really during the day.  He is able to walk around his house without difficulty.  He denies any wheezing.  He has an occasional cough only.  He denies any fevers.  He denies nasal congestion, ear pain, sore throat and sinus pain.  He continues to take ibuprofen, use cough medicine more for sleep than for coughing, his allergy medication, pantoprazole, Pepcid as needed and is doing gargles.  When I talked to him a few days ago he did have mild sore throat, occasional wheezing.  He is staying home from work.  As far as he knows no one is sick at work.    Observations/Objective: Appears well, in NAD Breathing comfortably.  No coughing.  Assessment and Plan: See Problem List for Assessment and Plan of chronic medical problems.   Follow Up Instructions:    I discussed the assessment and treatment plan with the patient. The patient was provided an opportunity to ask questions and all were answered. The patient agreed with the plan and demonstrated an understanding of the instructions.   The patient was advised to call back or seek an in-person evaluation if the symptoms worsen or  if the condition fails to improve as anticipated.  He will call Monday with an update.  He will call sooner if needed.   [name], MD

## 2019-03-11 ENCOUNTER — Encounter: Payer: Self-pay | Admitting: Internal Medicine

## 2019-03-11 ENCOUNTER — Ambulatory Visit (INDEPENDENT_AMBULATORY_CARE_PROVIDER_SITE_OTHER): Payer: BLUE CROSS/BLUE SHIELD | Admitting: Internal Medicine

## 2019-03-11 DIAGNOSIS — R5383 Other fatigue: Secondary | ICD-10-CM

## 2019-03-11 DIAGNOSIS — R0689 Other abnormalities of breathing: Secondary | ICD-10-CM | POA: Insufficient documentation

## 2019-03-11 DIAGNOSIS — R52 Pain, unspecified: Secondary | ICD-10-CM | POA: Insufficient documentation

## 2019-03-11 NOTE — Assessment & Plan Note (Signed)
He states an uncomfortable breathing at night, but it is better during the day He is not having any cough, wheeze or fevers His other symptoms include fatigue and body aches Given the lack of fever and coughing I do not think this is coronavirus for the flu Less likely bronchitis, pneumonia He has been walking around the house without difficulty so is not short of breath Continue symptomatic treatment His daughter does have an inhaler because she has asthma and advised that he can try her inhaler to see if that helps He will call over the weekend if this worsens and if it does not he will call Monday with an update

## 2019-03-11 NOTE — Assessment & Plan Note (Signed)
He continues to have diffuse body aches Ibuprofen is helping ?  Viral illness No obvious other coughing, fevers so coronavirus is seems less likely Still think this could be a viral infection, but if there is no improvement over the next few days may need to consider ESR, CRP to rule out PMR Continue ibuprofen for now, rest, fluids

## 2019-03-11 NOTE — Assessment & Plan Note (Signed)
Experiencing several days of fatigue, body aches and difficulty breathing at night, but no shortness of breath No fever, cough or wheeze No alarm symptoms were I feel he needs imaging, blood work or to be sent to the hospital Could still be viral If he is not better on Monday he will call-may have him get a chest x-ray or blood work at that time At this point continue symptomatic treatment, increase rest and fluids He will call Monday with an update and call sooner if needed

## 2019-03-15 ENCOUNTER — Telehealth: Payer: Self-pay | Admitting: Internal Medicine

## 2019-03-15 DIAGNOSIS — R0602 Shortness of breath: Secondary | ICD-10-CM

## 2019-03-15 DIAGNOSIS — R52 Pain, unspecified: Secondary | ICD-10-CM

## 2019-03-15 MED ORDER — ALBUTEROL SULFATE HFA 108 (90 BASE) MCG/ACT IN AERS: 2.0000 | INHALATION_SPRAY | Freq: Four times a day (QID) | RESPIRATORY_TRACT | 0 refills | Status: DC | PRN

## 2019-03-15 NOTE — Telephone Encounter (Signed)
Is he having any shortness of breath?  I will believed we talked about him trying his daughter is intolerant-has he done that?

## 2019-03-15 NOTE — Telephone Encounter (Signed)
Spoke with pt. Very little SOB. Has not used daughters inhaler. States she uses it everyday and he did not want to use it. Having body aches and general tiredness. Feels about the same as when he talked to you. No fever.

## 2019-03-15 NOTE — Telephone Encounter (Signed)
I  ordered labs - have him pop in the lab tomorrow if he can.   Lets also have him get a cxr.

## 2019-03-15 NOTE — Telephone Encounter (Signed)
I sent an inhaler to his pharmacy for him to try.  If his shortness of breath gets worse he needs to let us know or go to the emergency room if severe.  With lack of fever and minimal cough it does not seem that he has coronavirus, bronchitis or pneumonia.  Have his body aches improved?

## 2019-03-15 NOTE — Telephone Encounter (Signed)
Copied from CRM [id number]. Topic: Quick Communication - See Telephone Encounter >> Mar 15, 2019 12:15 PM Jay Schlichter wrote: CRM for notification. See Telephone encounter for: 03/15/19. An update from pt - same symptoms, nothing new. Symptoms not improving, still tired, can not leave bed, no fever, a little burning on chest. Pt has not been to work.  Pt want to know what to do next. Cb is [phone]

## 2019-03-15 NOTE — Telephone Encounter (Signed)
Pt aware of response below. States that body aches is the only thing mainly bothering him. Having to take a lot of ibuprofen. About the same as it was.

## 2019-03-15 NOTE — Telephone Encounter (Signed)
Was seen on 4/3

## 2019-03-16 ENCOUNTER — Other Ambulatory Visit: Payer: Self-pay

## 2019-03-16 ENCOUNTER — Ambulatory Visit (INDEPENDENT_AMBULATORY_CARE_PROVIDER_SITE_OTHER)
Admission: RE | Admit: 2019-03-16 | Discharge: 2019-03-16 | Disposition: A | Discharge status: Home / Self Care | Payer: BLUE CROSS/BLUE SHIELD | Source: Ambulatory Visit | Attending: Internal Medicine | Admitting: Internal Medicine

## 2019-03-16 ENCOUNTER — Other Ambulatory Visit (INDEPENDENT_AMBULATORY_CARE_PROVIDER_SITE_OTHER): Payer: BLUE CROSS/BLUE SHIELD

## 2019-03-16 DIAGNOSIS — R0602 Shortness of breath: Secondary | ICD-10-CM | POA: Diagnosis not present

## 2019-03-16 DIAGNOSIS — R0789 Other chest pain: Secondary | ICD-10-CM | POA: Diagnosis not present

## 2019-03-16 DIAGNOSIS — R52 Pain, unspecified: Secondary | ICD-10-CM | POA: Diagnosis not present

## 2019-03-16 LAB — CBC WITH DIFFERENTIAL/PLATELET
Basophils Absolute: 0.1 10*3/uL (ref 0.0–0.1)
Basophils Relative: 1.4 % (ref 0.0–3.0)
Eosinophils Absolute: 0.1 10*3/uL (ref 0.0–0.7)
Eosinophils Relative: 2.7 % (ref 0.0–5.0)
HCT: 42.9 % (ref 39.0–52.0)
Hemoglobin: 14.9 g/dL (ref 13.0–17.0)
Lymphocytes Relative: 63.7 % — ABNORMAL HIGH (ref 12.0–46.0)
Lymphs Abs: 3 10*3/uL (ref 0.7–4.0)
MCHC: 34.7 g/dL (ref 30.0–36.0)
MCV: 83.6 fl (ref 78.0–100.0)
Monocytes Absolute: 0.4 10*3/uL (ref 0.1–1.0)
Monocytes Relative: 9.2 % (ref 3.0–12.0)
Neutro Abs: 1.1 10*3/uL — ABNORMAL LOW (ref 1.4–7.7)
Neutrophils Relative %: 23 % — ABNORMAL LOW (ref 43.0–77.0)
Platelets: 227 10*3/uL (ref 150.0–400.0)
RBC: 5.13 Mil/uL (ref 4.22–5.81)
RDW: 13.2 % (ref 11.5–15.5)
WBC: 4.7 10*3/uL (ref 4.0–10.5)

## 2019-03-16 LAB — C-REACTIVE PROTEIN: CRP: <1 mg/dL (ref 0.5–20.0)

## 2019-03-16 LAB — COMPREHENSIVE METABOLIC PANEL
ALT: 41 U/L (ref 0–53)
AST: 25 U/L (ref 0–37)
Albumin: 4.3 g/dL (ref 3.5–5.2)
Alkaline Phosphatase: 63 U/L (ref 39–117)
BUN: 9 mg/dL (ref 6–23)
CO2: 27 mEq/L (ref 19–32)
Calcium: 9.4 mg/dL (ref 8.4–10.5)
Chloride: 101 mEq/L (ref 96–112)
Creatinine, Ser: 0.88 mg/dL (ref 0.40–1.50)
GFR: 91.2 mL/min (ref >60.00)
Glucose, Bld: 126 mg/dL — ABNORMAL HIGH (ref 70–99)
Potassium: 3.9 mEq/L (ref 3.5–5.1)
Sodium: 135 mEq/L (ref 135–145)
Total Bilirubin: 0.7 mg/dL (ref 0.2–1.2)
Total Protein: 7.4 g/dL (ref 6.0–8.3)

## 2019-03-16 LAB — TSH: TSH: 1.35 u[IU]/mL (ref 0.35–4.50)

## 2019-03-16 LAB — SEDIMENTATION RATE: Sed Rate: 14 mm/hr (ref 0–20)

## 2019-03-16 NOTE — Telephone Encounter (Signed)
Pt aware.

## 2019-03-24 ENCOUNTER — Other Ambulatory Visit: Payer: Self-pay

## 2019-03-24 ENCOUNTER — Ambulatory Visit (INDEPENDENT_AMBULATORY_CARE_PROVIDER_SITE_OTHER): Payer: BLUE CROSS/BLUE SHIELD | Admitting: Internal Medicine

## 2019-03-24 ENCOUNTER — Ambulatory Visit: Payer: BLUE CROSS/BLUE SHIELD | Admitting: Internal Medicine

## 2019-03-24 ENCOUNTER — Telehealth: Payer: Self-pay

## 2019-03-24 ENCOUNTER — Encounter: Payer: Self-pay | Admitting: Internal Medicine

## 2019-03-24 VITALS — Ht 73.0 in

## 2019-03-24 DIAGNOSIS — F5104 Psychophysiologic insomnia: Secondary | ICD-10-CM

## 2019-03-24 DIAGNOSIS — K580 Irritable bowel syndrome with diarrhea: Secondary | ICD-10-CM | POA: Diagnosis not present

## 2019-03-24 DIAGNOSIS — R5382 Chronic fatigue, unspecified: Secondary | ICD-10-CM

## 2019-03-24 DIAGNOSIS — R52 Pain, unspecified: Secondary | ICD-10-CM

## 2019-03-24 MED ORDER — RIFAXIMIN 550 MG PO TABS: 550.0000 mg | ORAL_TABLET | Freq: Three times a day (TID) | ORAL | 0 refills | Status: AC

## 2019-03-24 NOTE — Telephone Encounter (Signed)
I have started a prior authorization for patients xifaxan 550 mg tablets, one TID x 14 days thru [employer]. Dx: K58.0, IBS-D. He has tried and failed bentyl. Will await outcome. Patient aware could take several days. I told him he can try to get the pharmacy to re-run it in a couple of days or call us back if any concerns as I am off next week.

## 2019-03-24 NOTE — Progress Notes (Signed)
  TELEHEALTH ENCOUNTER IN SETTING OF COVID-19 PANDEMIC - REQUESTED BY PATIENT SERVICE PROVIDED BY TELEMEDECINE - TYPE: Zoom A/V PATIENT LOCATION: Home PATIENT HAS CONSENTED TO TELEHEALTH VISIT PROVIDER LOCATION: OFFICE  PARTICIPANTS OTHER THAN PATIENT none TIME SPENT ON CALL: 25 minutes    [name] 53 y.o. 08/21/1966 [id number]  Assessment & Plan:   Encounter Diagnoses  Name Primary?  . Irritable bowel syndrome with diarrhea Yes  . Body aches - chronic and recurrent   . Chronic fatigue   . Chronic insomnia    These are really chronic recurrent problems that my chart review tells me go back from the time I met him in 2011.  Why they are flaring now is not clear he denies anxiety over the COVID-19 outbreak.  I really cannot help his fatigue insomnia and body aches most likely.  Regarding his irritable bowel syndrome problems, he has improved with Xifaxan treatment in the past, he has failed dicyclomine and metronidazole and other IBS agents.  There will be a prior authorization required but we will try treatment with Xifaxan 550 mg 3 times daily for 14 days.  He may follow-up as needed.  I reassured him as best I could.  He is quite concerned that he could have cirrhosis or a problem with his pancreas and I think there is no good indication of this based upon the chronicity of his symptoms and the other objective data we have over the years.  He has also been tested for celiac disease in the past and that is negative.  I appreciate the opportunity to care for this patient. CC: Burns, Stacy J, MD   Subjective:   Chief Complaint: Abdominal pain diarrhea fatigue body aches  HPI The patient is a 53-year-old [place] man known to me over the years with a diagnosis of irritable bowel syndrome.  Back when I had met him in 2011 he had quite a bit of recurrent symptomatology that seem to have subsided over the years.  He tested negative for celiac disease.  He had negative imaging  and endoscopic evaluations.  More recently he had a negative screening colonoscopy in November 2019.  He has GERD symptoms that are generally controlled with PPI he has used some intermittent sucralfate in the past.  He is currently on PPI and H2 blocker.  His main complaints now are that of body aches severe fatigue and alternating bowel habits though mostly diarrhea with some constipation.  He has "0 energy".  He saw Dr. Burns for this on April 8, and the only thing outstanding in his labs of CBC CMET were a slight lymphocytosis.  A nonfasting glucose was 126.  C-reactive protein and sed rate were very low and normal.  He is concerned because there is a family history of liver problems and cirrhosis in his father and sister, and wonders if that could be the problem because I told him after a 2017 CT enterography he had fatty liver.  His LFTs are normal.  He does not sleep well either.  This is not a new problem either he has been on Ambien in the past but does not want to take anything now.  He is able to go to work some, but not persistently.  I directly asked him if he was anxious or worried about the coronavirus and he said no.  He does not think he is losing weight. Wt Readings from Last 3 Encounters:  10/28/18 211 lb (95.7 kg)  10/19/18   218 lb (98.9 kg)  10/06/18 211 lb 1.9 oz (95.8 kg)     No Known Allergies Current Meds  Medication Sig  . albuterol (PROVENTIL HFA;VENTOLIN HFA) 108 (90 Base) MCG/ACT inhaler Inhale 2 puffs into the lungs every 6 (six) hours as needed for wheezing or shortness of breath.  . cetirizine (ZYRTEC) 10 MG tablet Take 10 mg by mouth daily as needed for allergies.  . Cholecalciferol (VITAMIN D3) 2000 units capsule Take by mouth.  . pantoprazole (PROTONIX) 40 MG tablet TAKE 1 TABLET BY MOUTH EVERY DAY  . sildenafil (REVATIO) 20 MG tablet TAKE 3 TO 5 TABLETS AS NEEDED   Past Medical History:  Diagnosis Date  . Allergy   . Chest wall pain   . Cough   . Diabetes  ([place])   . Diarrhea recurrent  . GERD (gastroesophageal reflux disease)   . Headache(784.0)   . Irritable bowel syndrome   . Obstructive sleep apnea    apnealink 10/23/10 AHI 5, Sp02 low 87%  . Postural lightheadedness   . SBO (small bowel obstruction) ([place]) 12/2015  . Sleep apnea   . Small bowel obstruction [facility])    Past Surgical History:  Procedure Laterality Date  . CHOLECYSTECTOMY  2009  . COLECTOMY  1994   segmental  . COLONOSCOPY  07/17/2011   hemorrhoids, otherwise normal including terminal ileum and random colon biopises  . GASTROSTOMY  2005   stamm  . LAPAROTOMY  2002, 2005, 2009  . SPHINCTEROTOMY  2009   lateral internal  . 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   Alcohol Use-no   Illicit Drug Use-no   Married-Divorced-Married again 2009-    3 children 2 sons and 1 daughter   [place] heritage   No regular exercise   family history includes Hypertension in his mother; Liver disease in his father; Pancreatic cancer in his sister.   Review of Systems As per HPI

## 2019-03-24 NOTE — Patient Instructions (Addendum)
Good to see you and talk.  I am sorry you do not feel well again.  As we discussed I do not think you have liver damage or cirrhosis.  For your irritable bowel syndrome I have prescribed Xifaxan. Hopefully that will help you feel better. It will require a prior authorization so it may not be ready right away like I thought.  I appreciate the opportunity to care for you. Iva Boop, MD, [facility]

## 2019-03-24 NOTE — Telephone Encounter (Signed)
The xifaxan was approved and I called the pharmacy. The co-pay is over $600.00. Please advise less expensive alternative Sir?  I called [name] and told him that I am getting you to advise another option. I checked and we have no samples and with COVID we don't have reps coming in.

## 2019-03-25 MED ORDER — AMOXICILLIN-POT CLAVULANATE 875-125 MG PO TABS: 1.0000 | ORAL_TABLET | Freq: Two times a day (BID) | ORAL | 0 refills | Status: DC

## 2019-03-25 NOTE — Telephone Encounter (Signed)
Try augmenting 875 bid x 10 d

## 2019-03-25 NOTE — Telephone Encounter (Signed)
Augmentin has been sent in and I called and left him a detailed message about this.

## 2019-03-25 NOTE — Telephone Encounter (Signed)
I'm in the office today Sir if you can think of options for [name] I'll be happy to send it in, thank you.

## 2019-04-08 ENCOUNTER — Other Ambulatory Visit: Payer: Self-pay | Admitting: Internal Medicine

## 2019-04-11 ENCOUNTER — Ambulatory Visit: Payer: BLUE CROSS/BLUE SHIELD | Admitting: Internal Medicine

## 2019-04-15 ENCOUNTER — Encounter: Payer: Self-pay | Admitting: Internal Medicine

## 2019-04-15 ENCOUNTER — Ambulatory Visit (INDEPENDENT_AMBULATORY_CARE_PROVIDER_SITE_OTHER): Payer: BLUE CROSS/BLUE SHIELD | Admitting: Internal Medicine

## 2019-04-15 ENCOUNTER — Ambulatory Visit: Payer: Self-pay | Admitting: Internal Medicine

## 2019-04-15 DIAGNOSIS — M545 Low back pain, unspecified: Secondary | ICD-10-CM | POA: Insufficient documentation

## 2019-04-15 DIAGNOSIS — G8929 Other chronic pain: Secondary | ICD-10-CM | POA: Insufficient documentation

## 2019-04-15 MED ORDER — GABAPENTIN 100 MG PO CAPS: 200.0000 mg | ORAL_CAPSULE | Freq: Two times a day (BID) | ORAL | 0 refills | Status: DC

## 2019-04-15 MED ORDER — PREDNISONE 20 MG PO TABS: 40.0000 mg | ORAL_TABLET | Freq: Every day | ORAL | 0 refills | Status: DC

## 2019-04-15 NOTE — Progress Notes (Signed)
Virtual Visit via Video Note  I connected with [name] on 04/15/19 at 10:45 AM EDT by a video enabled telemedicine application and verified that I am speaking with the correct person using two identifiers.   I discussed the limitations of evaluation and management by telemedicine and the availability of in person appointments. The patient expressed understanding and agreed to proceed.  The patient is currently at home and I am in the office.    No referring provider.    History of Present Illness: This is an acute visit for left pain near his kidney.  His pain started a couple of days ago.  He states is in the left kidney area, but when I asked him to show me exactly where the pain was it is in the left lower back.  He was concerned that he had a kidney stone because previous imaging had shown a stone.  He describes the pain as a burning-like pain-feels like the area is on fire.  The pain has gotten worse since it started.  He feels like the pain is inside.  He denies any rashes.  Moving around or changing positions does increase his pain.  Sitting or laying still improves the pain, but the pain is still there.  This morning he noticed that his left middle toes were numb and tingly.  He does not have any pain or numbness/tingling in his left leg.  He did try rubbing Vicks on the area and it did not help.  Because of his concern with a kidney stone or urinary issue he was drinking cranberry juice and that has not helped either.  He denies any fevers or chills.  He denies dysuria, increased urinary frequency or blood in the urine.  He denies any new activities or causes for the pain.  He has not had back issues in the past.  He denies abdominal pain.  He has not had any fevers.   Review of Systems  Constitutional: Negative for chills and fever.  Gastrointestinal: Negative for abdominal pain.  Genitourinary: Negative for dysuria, frequency and hematuria.  Musculoskeletal: Positive for  back pain (no leg pain).  Skin: Negative for rash.  Neurological: Positive for tingling.     Social History   Socioeconomic History  . Marital status: Married    Spouse name: Not on file  . Number of children: Not on file  . Years of education: Not on file  . Highest education level: Not on file  Occupational History  . Occupation: FORK LIFT DRIVER    Employer: [employer]  Social Needs  . Financial resource strain: Not on file  . Food insecurity:    Worry: Not on file    Inability: Not on file  . Transportation needs:    Medical: Not on file    Non-medical: Not on file  Tobacco Use  . Smoking status: Never Smoker  . Smokeless tobacco: Never Used  Substance and Sexual Activity  . Alcohol use: No  . Drug use: No  . Sexual activity: Not on file  Lifestyle  . Physical activity:    Days per week: Not on file    Minutes per session: Not on file  . Stress: Not on file  Relationships  . Social connections:    Talks on phone: Not on file    Gets together: Not on file    Attends religious service: Not on file    Active member of club or organization: Not on file  Attends meetings of clubs or organizations: Not on file    Relationship status: Not on file  Other Topics Concern  . Not on file  Social History Narrative   Occupation: Group Engineer, [employer] (deliveries)   Patient has never smoked.   Alcohol Use-no   Illicit Drug Use-no   Married-Divorced-Married again 2009-    3 children 2 sons and 1 daughter   [place] heritage   No regular exercise     Observations/Objective: Appears well in NAD He does have some pain with certain movements.  Assessment and Plan:  See Problem List for Assessment and Plan of chronic medical problems.   Follow Up Instructions:    I discussed the assessment and treatment plan with the patient. The patient was provided an opportunity to ask questions and all were answered. The patient agreed with the plan and  demonstrated an understanding of the instructions.   The patient was advised to call back or seek an in-person evaluation if the symptoms worsen or if the condition fails to improve as anticipated.    Pincus SanesStacy J Chimene Salo, MD

## 2019-04-15 NOTE — Assessment & Plan Note (Signed)
Left lower back pain that is burning in nature and started a couple of days ago Possibly with radiculopathy-he has had tingling in his left middle toes this morning Worse with movement Discussed that I do not think this is related to his kidney or urinary Likely muscular skeletal in nature-possible radiculopathy as well Prednisone 40 mg daily for 5 days Start gabapentin 200 mg twice daily-can titrate if needed and tolerated Advised ice to the area-he can also try heat He does have an orthopedic that he can go to if there is no improvement over the weekend he will let me know how he is doing after a few days

## 2019-04-15 NOTE — Telephone Encounter (Signed)
FYI seeing you today for virtual

## 2019-04-15 NOTE — Telephone Encounter (Signed)
Pt. Reports he started having left flank pain 2 days ago and "it has gotten worse." Hurts with movement, has tried topical ointments without relief. Sometimes has pain in his left leg and toes. Denies any urinary symptoms - has increased his fluid intake " in case it is a stone." Was up "all night in pain." Warm transfer to Sam in the practice for an appointment.  Answer Assessment - Initial Assessment Questions 1. ONSET: "When did the pain begin?"      Started 2 days ago 2. LOCATION: "Where does it hurt?" (upper, mid or lower back)     Left flank 3. SEVERITY: "How bad is the pain?"  (e.g., Scale 1-10; mild, moderate, or severe)   - MILD (1-3): doesn't interfere with normal activities    - MODERATE (4-7): interferes with normal activities or awakens from sleep    - SEVERE (8-10): excruciating pain, unable to do any normal activities      10 4. PATTERN: "Is the pain constant?" (e.g., yes, no; constant, intermittent)      Intermittent 5. RADIATION: "Does the pain shoot into your legs or elsewhere?"     Left leg and toes 6. CAUSE:  "What do you think is causing the back pain?"      Unsure 7. BACK OVERUSE:  "Any recent lifting of heavy objects, strenuous work or exercise?"     No 8. MEDICATIONS: "What have you taken so far for the pain?" (e.g., nothing, acetaminophen, NSAIDS)     Topical lotion 9. NEUROLOGIC SYMPTOMS: "Do you have any weakness, numbness, or problems with bowel/bladder control?"     No 10. OTHER SYMPTOMS: "Do you have any other symptoms?" (e.g., fever, abdominal pain, burning with urination, blood in urine)       No 11. PREGNANCY: "Is there any chance you are pregnant?" (e.g., yes, no; LMP)       n/a  Protocols used: BACK PAIN-A-AH

## 2019-04-19 DIAGNOSIS — M545 Low back pain: Secondary | ICD-10-CM | POA: Diagnosis not present

## 2019-05-30 ENCOUNTER — Ambulatory Visit: Payer: BLUE CROSS/BLUE SHIELD | Admitting: Internal Medicine

## 2019-07-13 ENCOUNTER — Other Ambulatory Visit: Payer: Self-pay

## 2019-07-13 ENCOUNTER — Encounter: Payer: Self-pay | Admitting: Internal Medicine

## 2019-07-13 ENCOUNTER — Ambulatory Visit (INDEPENDENT_AMBULATORY_CARE_PROVIDER_SITE_OTHER): Payer: BC Managed Care – PPO | Admitting: Internal Medicine

## 2019-07-13 DIAGNOSIS — R5383 Other fatigue: Secondary | ICD-10-CM

## 2019-07-13 DIAGNOSIS — R05 Cough: Secondary | ICD-10-CM

## 2019-07-13 DIAGNOSIS — R51 Headache: Secondary | ICD-10-CM | POA: Diagnosis not present

## 2019-07-13 DIAGNOSIS — Z20822 Contact with and (suspected) exposure to covid-19: Secondary | ICD-10-CM | POA: Insufficient documentation

## 2019-07-13 NOTE — Assessment & Plan Note (Signed)
Cold symptoms - need to r/o COVID- 19 Test ordered Symptomatic treatment He has been staying at home for the past two weeks and will continue to do so until he gets the results Can return to work if COVID test negative Will consider Abx treatment if COVID test is negative

## 2019-07-13 NOTE — Progress Notes (Signed)
Virtual Visit via Video Note  I connected with [name] on 07/13/19 at  1:00 PM EDT by a video enabled telemedicine application and verified that I am speaking with the correct person using two identifiers.   I discussed the limitations of evaluation and management by telemedicine and the availability of in person appointments. The patient expressed understanding and agreed to proceed.  The patient is currently at home and I am in the office.    No referring provider.    History of Present Illness: This is an acute visit for cough, fatigue.  He has not felt well for two weeks.  He states fatigue, cough that he occasionally brings up phlegm, sore throat that started about two weeks ago, mild headaches.  He denies fever, SOB and wheeze.  He denies changes in taste and smell.  There has been some COVID at work, but he has not been there but only week in July.   He has been taking claritin, ibuprofen.    He only worked one week in July.  He denies any known COVID exposure.     Review of Systems  Constitutional: Positive for malaise/fatigue. Negative for fever.  HENT: Positive for congestion and sore throat (> 1 week). Negative for ear pain and sinus pain.   Respiratory: Positive for cough (some phlegm). Negative for shortness of breath and wheezing.   Gastrointestinal: Positive for heartburn.  Neurological: Positive for headaches (mild).       Social History   Socioeconomic History  . Marital status: Married    Spouse name: Not on file  . Number of children: Not on file  . Years of education: Not on file  . Highest education level: Not on file  Occupational History  . Occupation: FORK LIFT DRIVER    Employer: [employer]  Social Needs  . Financial resource strain: Not on file  . Food insecurity    Worry: Not on file    Inability: Not on file  . Transportation needs    Medical: Not on file    Non-medical: Not on file  Tobacco Use  . Smoking status: Never  Smoker  . Smokeless tobacco: Never Used  Substance and Sexual Activity  . Alcohol use: No  . Drug use: No  . Sexual activity: Not on file  Lifestyle  . Physical activity    Days per week: Not on file    Minutes per session: Not on file  . Stress: Not on file  Relationships  . Social Herbalist on phone: Not on file    Gets together: Not on file    Attends religious service: Not on file    Active member of club or organization: Not on file    Attends meetings of clubs or organizations: Not on file    Relationship status: Not on file  Other Topics Concern  . Not on file  Social History Narrative   Occupation: Group Archivist (deliveries)   Patient has never smoked.   Alcohol Use-no   Illicit Drug Use-no   Married-Divorced-Married again 2009-    3 children 2 sons and 1 daughter   [place]ian heritage   No regular exercise     Observations/Objective: Appears well in NAD Breathing normally  Assessment and Plan:  See Problem List for Assessment and Plan of chronic medical problems.   Follow Up Instructions:    I discussed the assessment and treatment plan with the patient. The patient was provided  an opportunity to ask questions and all were answered. The patient agreed with the plan and demonstrated an understanding of the instructions.   The patient was advised to call back or seek an in-person evaluation if the symptoms worsen or if the condition fails to improve as anticipated.    Binnie Rail, MD

## 2019-07-14 ENCOUNTER — Other Ambulatory Visit: Payer: Self-pay

## 2019-07-14 DIAGNOSIS — Z20822 Contact with and (suspected) exposure to covid-19: Secondary | ICD-10-CM

## 2019-07-16 LAB — NOVEL CORONAVIRUS, NAA: SARS-CoV-2, NAA: NOT DETECTED

## 2019-07-16 LAB — SPECIMEN STATUS REPORT

## 2019-07-18 ENCOUNTER — Telehealth: Payer: Self-pay | Admitting: Internal Medicine

## 2019-07-18 MED ORDER — AMOXICILLIN-POT CLAVULANATE 875-125 MG PO TABS: 1.0000 | ORAL_TABLET | Freq: Two times a day (BID) | ORAL | 0 refills | Status: DC

## 2019-07-18 NOTE — Telephone Encounter (Signed)
Rx sent. Pt aware.  

## 2019-07-18 NOTE — Telephone Encounter (Signed)
Negative COVID results given. Patient results "NOT Detected." Caller expressed understanding.   Pt stated he is still having a sore throat and would like to know if Dr. Quay Burow will send in an antibiotic that was discussed.

## 2019-07-18 NOTE — Telephone Encounter (Signed)
Antibiotic pending-2 pharmacies on file-not sure which one he prefers.

## 2019-07-20 ENCOUNTER — Encounter: Payer: Self-pay | Admitting: Internal Medicine

## 2019-07-20 ENCOUNTER — Telehealth: Payer: Self-pay

## 2019-07-20 NOTE — Telephone Encounter (Signed)
LVM with response below. Told him to call back to let us know if he would like to be retested.

## 2019-07-20 NOTE — Telephone Encounter (Signed)
Copied from CRM [id number]. Topic: General - Other >> Jul 20, 2019  3:22 PM Williams, Candice N wrote: Patient requesting letter stating that he was negative for covid-19 mailed to his home address. Patient also requesting call back from CMA to discuss.  

## 2019-07-20 NOTE — Telephone Encounter (Addendum)
Pt calling to ask if you think the covid test is accurate?  Pt states a sore throat, lethargic, and sneezing for him is not normal. Pt wants to make sure before he goes back to work. Wants to know if he should be re tested.  Pt works on the weekend, and needs to know something asap.  Pt would like a note stating his results negative for work.  Pt states he cannot figure out [employer].

## 2019-07-20 NOTE — Telephone Encounter (Signed)
It is impossible to know exactly how accurate the test is.  We can retest him, but result will not come back for a couple of days.

## 2019-07-22 ENCOUNTER — Telehealth: Payer: Self-pay

## 2019-07-22 NOTE — Telephone Encounter (Signed)
Copied from [place] [id number]. Topic: General - Other >> Jul 20, 2019  3:22 PM Sheran Luz wrote: Patient requesting letter stating that he was negative for covid-19 mailed to his home address. Patient also requesting call back from CMA to discuss.

## 2019-07-22 NOTE — Telephone Encounter (Signed)
Note written and received through my chart

## 2019-08-11 DIAGNOSIS — L309 Dermatitis, unspecified: Secondary | ICD-10-CM | POA: Diagnosis not present

## 2019-08-23 DIAGNOSIS — R739 Hyperglycemia, unspecified: Secondary | ICD-10-CM | POA: Diagnosis not present

## 2019-08-23 LAB — HM DIABETES EYE EXAM

## 2019-08-24 ENCOUNTER — Other Ambulatory Visit: Payer: Self-pay | Admitting: Internal Medicine

## 2019-09-05 ENCOUNTER — Encounter: Payer: Self-pay | Admitting: Internal Medicine

## 2019-10-09 NOTE — Patient Instructions (Addendum)
Try adding metamucil or benefiber for your constipation. Also try colace and take daily.    Tests ordered today. Your results will be released to MyChart (or called to you) after review.  If any changes need to be made, you will be notified at that same time.  All other Health Maintenance issues reviewed.   All recommended immunizations and age-appropriate screenings are up-to-date or discussed.   Medications reviewed and updated.  Changes include :   none   A referral was ordered for pulmonary for your sleep apnea.   Please followup in 6 months    Health Maintenance, Male Adopting a healthy lifestyle and getting preventive care are important in promoting health and wellness. Ask your health care provider about:  The right schedule for you to have regular tests and exams.  Things you can do on your own to prevent diseases and keep yourself healthy. What should I know about diet, weight, and exercise? Eat a healthy diet   Eat a diet that includes plenty of vegetables, fruits, low-fat dairy products, and lean protein.  Do not eat a lot of foods that are high in solid fats, added sugars, or sodium. Maintain a healthy weight Body mass index (BMI) is a measurement that can be used to identify possible weight problems. It estimates body fat based on height and weight. Your health care provider can help determine your BMI and help you achieve or maintain a healthy weight. Get regular exercise Get regular exercise. This is one of the most important things you can do for your health. Most adults should:  Exercise for at least 150 minutes each week. The exercise should increase your heart rate and make you sweat (moderate-intensity exercise).  Do strengthening exercises at least twice a week. This is in addition to the moderate-intensity exercise.  Spend less time sitting. Even light physical activity can be beneficial. Watch cholesterol and blood lipids Have your blood tested for  lipids and cholesterol at 51 years of age, then have this test every 5 years. You may need to have your cholesterol levels checked more often if:  Your lipid or cholesterol levels are high.  You are older than 51 years of age.  You are at high risk for heart disease. What should I know about cancer screening? Many types of cancers can be detected early and may often be prevented. Depending on your health history and family history, you may need to have cancer screening at various ages. This may include screening for:  Colorectal cancer.  Prostate cancer.  Skin cancer.  Lung cancer. What should I know about heart disease, diabetes, and high blood pressure? Blood pressure and heart disease  High blood pressure causes heart disease and increases the risk of stroke. This is more likely to develop in people who have high blood pressure readings, are of African descent, or are overweight.  Talk with your health care provider about your target blood pressure readings.  Have your blood pressure checked: ? Every 3-5 years if you are 80-26 years of age. ? Every year if you are 40 years old or older.  If you are between the ages of 65 and 88 and are a current or former smoker, ask your health care provider if you should have a one-time screening for abdominal aortic aneurysm (AAA). Diabetes Have regular diabetes screenings. This checks your fasting blood sugar level. Have the screening done:  Once every three years after age 53 if you are at a normal weight  and have a low risk for diabetes.  More often and at a younger age if you are overweight or have a high risk for diabetes. What should I know about preventing infection? Hepatitis B If you have a higher risk for hepatitis B, you should be screened for this virus. Talk with your health care provider to find out if you are at risk for hepatitis B infection. Hepatitis C Blood testing is recommended for:  Everyone born from [birth year] through  1965.  Anyone with known risk factors for hepatitis C. Sexually transmitted infections (STIs)  You should be screened each year for STIs, including gonorrhea and chlamydia, if: ? You are sexually active and are younger than 51 years of age. ? You are older than 51 years of age and your health care provider tells you that you are at risk for this type of infection. ? Your sexual activity has changed since you were last screened, and you are at increased risk for chlamydia or gonorrhea. Ask your health care provider if you are at risk.  Ask your health care provider about whether you are at high risk for HIV. Your health care provider may recommend a prescription medicine to help prevent HIV infection. If you choose to take medicine to prevent HIV, you should first get tested for HIV. You should then be tested every 3 months for as long as you are taking the medicine. Follow these instructions at home: Lifestyle  Do not use any products that contain nicotine or tobacco, such as cigarettes, e-cigarettes, and chewing tobacco. If you need help quitting, ask your health care provider.  Do not use street drugs.  Do not share needles.  Ask your health care provider for help if you need support or information about quitting drugs. Alcohol use  Do not drink alcohol if your health care provider tells you not to drink.  If you drink alcohol: ? Limit how much you have to 0-2 drinks a day. ? Be aware of how much alcohol is in your drink. In the [place] one drink equals one 12 oz bottle of beer (355 mL), one 5 oz glass of wine (148 mL), or one 1 oz glass of hard liquor (44 mL). General instructions  Schedule regular health, dental, and eye exams.  Stay current with your vaccines.  Tell your health care provider if: ? You often feel depressed. ? You have ever been abused or do not feel safe at home. Summary  Adopting a healthy lifestyle and getting preventive care are important in promoting  health and wellness.  Follow your health care provider's instructions about healthy diet, exercising, and getting tested or screened for diseases.  Follow your health care provider's instructions on monitoring your cholesterol and blood pressure. This information is not intended to replace advice given to you by your health care provider. Make sure you discuss any questions you have with your health care provider. Document Released: 05/22/2008 Document Revised: 11/17/2018 Document Reviewed: 11/17/2018 Elsevier Patient Education  2020 [employer].

## 2019-10-09 NOTE — Progress Notes (Signed)
Subjective:    Patient ID: [name], male    DOB: 11/06/1966, 53 y.o.   MRN: [id number]  HPI He is here for a physical exam.   He has fatigue.    He has sleep apnea and has not tolerated cpap.  He states SOB when he wakes up in the middle of the night.  He still has constipation.  He takes miralax daily, but he is still constipated.  He does not drink much water during the day.   Medications and allergies reviewed with patient and updated if appropriate.  Patient Active Problem List   Diagnosis Date Noted  . Chronic constipation 10/10/2019  . Acute left-sided low back pain 04/15/2019  . Chronic insomnia 03/24/2019  . Breathing difficult 03/11/2019  . Body aches 03/11/2019  . Abdominal wall hernia 10/06/2018  . Arthralgia 06/01/2017  . Other chest pain 06/01/2017  . Fatigue 06/01/2017  . Diabetes (HCC) 10/01/2016  . Meralgia paresthetica of right side 01/25/2016  . Vitamin D deficiency 01/25/2016  . SBO (small bowel obstruction) (HCC) 01/02/2016  . Allergic rhinitis 02/07/2011  . OBSTRUCTIVE SLEEP APNEA 10/23/2010  . GERD 03/12/2010  . Irritable bowel syndrome 03/12/2010  . DIARRHEA, RECURRENT 02/05/2010    Current Outpatient Medications on File Prior to Visit  Medication Sig Dispense Refill  . cetirizine (ZYRTEC) 10 MG tablet Take 10 mg by mouth daily as needed for allergies.    . pantoprazole (PROTONIX) 40 MG tablet TAKE 1 TABLET BY MOUTH EVERY DAY 90 tablet 1   No current facility-administered medications on file prior to visit.     Past Medical History:  Diagnosis Date  . Allergy   . Chest wall pain   . Cough   . Diabetes (HCC)   . Diarrhea recurrent  . GERD (gastroesophageal reflux disease)   . Headache(784.0)   . Irritable bowel syndrome   . Obstructive sleep apnea    apnealink 10/23/10 AHI 5, Sp02 low 87%  . Postural lightheadedness   . SBO (small bowel obstruction) (HCC) 12/2015  . Sleep apnea   . Small bowel obstruction [facility](HCC)     Past  Surgical History:  Procedure Laterality Date  . CHOLECYSTECTOMY  2009  . COLECTOMY  1994   segmental  . COLONOSCOPY  07/17/2011   hemorrhoids, otherwise normal including terminal ileum and random colon biopises  . GASTROSTOMY  2005   stamm  . LAPAROTOMY  2002, 2005, 2009  . SPHINCTEROTOMY  2009   lateral internal  . UPPER GASTROINTESTINAL ENDOSCOPY  07/17/2011   normal, including duodenal biopsies    Social History   Socioeconomic History  . Marital status: Married    Spouse name: Not on file  . Number of children: Not on file  . Years of education: Not on file  . Highest education level: Not on file  Occupational History  . Occupation: FORK LIFT DRIVER    Employer: [employer]  Social Needs  . Financial resource strain: Not on file  . Food insecurity    Worry: Not on file    Inability: Not on file  . Transportation needs    Medical: Not on file    Non-medical: Not on file  Tobacco Use  . Smoking status: Never Smoker  . Smokeless tobacco: Never Used  Substance and Sexual Activity  . Alcohol use: No  . Drug use: No  . Sexual activity: Not on file  Lifestyle  . Physical activity    Days per week: Not  on file    Minutes per session: Not on file  . Stress: Not on file  Relationships  . Social Musician on phone: Not on file    Gets together: Not on file    Attends religious service: Not on file    Active member of club or organization: Not on file    Attends meetings of clubs or organizations: Not on file    Relationship status: Not on file  Other Topics Concern  . Not on file  Social History Narrative   Occupation: Group Engineer, petroleum (deliveries)   Patient has never smoked.   Alcohol Use-no   Illicit Drug Use-no   Married-Divorced-Married again 2009-    3 children 2 sons and 1 daughter   [place]ian heritage   No regular exercise    Family History  Problem Relation Age of Onset  . Liver disease Father        liver  cancer???  . Hypertension Mother   . Pancreatic cancer Sister   . Colon cancer Neg Hx   . Esophageal cancer Neg Hx   . Rectal cancer Neg Hx   . Stomach cancer Neg Hx     Review of Systems  Constitutional: Negative for chills and fever.  HENT: Positive for postnasal drip and sore throat (from PND).   Eyes: Negative for visual disturbance.  Respiratory: Positive for apnea (feels SOB in the middle of the night). Negative for cough, shortness of breath and wheezing.   Cardiovascular: Negative for chest pain, palpitations and leg swelling.  Gastrointestinal: Positive for abdominal pain (chronic, intermittent) and constipation (miralax daily). Negative for blood in stool, diarrhea and nausea.       Burping  Genitourinary: Negative for dysuria and hematuria.  Musculoskeletal: Positive for arthralgias (hips).  Skin: Negative for color change and rash.  Neurological: Positive for light-headedness (when he does not sleep good). Negative for headaches.  Psychiatric/Behavioral: Negative for dysphoric mood. The patient is not nervous/anxious.        Objective:   Vitals:   10/10/19 1353  BP: 110/68  Pulse: (!) 53  Resp: 16  Temp: 98.1 F (36.7 C)  SpO2: 97%   Filed Weights   10/10/19 1353  Weight: 211 lb (95.7 kg)   Body mass index is 27.84 kg/m.  BP Readings from Last 3 Encounters:  10/10/19 110/68  10/28/18 111/79  10/06/18 118/70    Wt Readings from Last 3 Encounters:  10/10/19 211 lb (95.7 kg)  10/28/18 211 lb (95.7 kg)  10/19/18 218 lb (98.9 kg)     Physical Exam Constitutional: He appears well-developed and well-nourished. No distress.  HENT:  Head: Normocephalic and atraumatic.  Right Ear: External ear normal.  Left Ear: External ear normal.  Mouth/Throat: Oropharynx is clear and moist.  Normal ear canals and TM b/l  Eyes: Conjunctivae and EOM are normal.  Neck: Neck supple. No tracheal deviation present. No thyromegaly present.  No carotid bruit   Cardiovascular: Normal rate, regular rhythm, normal heart sounds and intact distal pulses.   No murmur heard. Pulmonary/Chest: Effort normal and breath sounds normal. No respiratory distress. He has no wheezes. He has no rales.  Abdominal: Soft. Vertical scar down abdomen from prior surgery - tender chronically w/o change. He exhibits no distension. There is no other tenderness.  Genitourinary: deferred  Musculoskeletal: He exhibits no edema.  Lymphadenopathy:   He has no cervical adenopathy.  Skin: Skin is warm and dry. He is not diaphoretic.  Psychiatric: He has a normal mood and affect. His behavior is normal.    Diabetic Foot Exam - Simple   Simple Foot Form Diabetic Foot exam was performed with the following findings: Yes 10/10/2019  2:41 PM  Visual Inspection No deformities, no ulcerations, no other skin breakdown bilaterally: Yes Sensation Testing Intact to touch and monofilament testing bilaterally: Yes Pulse Check Posterior Tibialis and Dorsalis pulse intact bilaterally: Yes Comments         Assessment & Plan:   Physical exam: Screening blood work  ordered Immunizations  Flu vaccine up to date, others up to date Colonoscopy    Up to date  Eye exams  Up to date     Exercise   none Weight   Slightly overweight Substance abuse   none  See Problem List for Assessment and Plan of chronic medical problems.

## 2019-10-10 ENCOUNTER — Ambulatory Visit (INDEPENDENT_AMBULATORY_CARE_PROVIDER_SITE_OTHER): Payer: BC Managed Care – PPO | Admitting: Internal Medicine

## 2019-10-10 ENCOUNTER — Other Ambulatory Visit: Payer: Self-pay

## 2019-10-10 ENCOUNTER — Other Ambulatory Visit (INDEPENDENT_AMBULATORY_CARE_PROVIDER_SITE_OTHER): Payer: BC Managed Care – PPO

## 2019-10-10 ENCOUNTER — Encounter: Payer: Self-pay | Admitting: Internal Medicine

## 2019-10-10 VITALS — BP 110/68 | HR 53 | Temp 98.1°F | Resp 16 | Ht 73.0 in | Wt 211.0 lb

## 2019-10-10 DIAGNOSIS — K219 Gastro-esophageal reflux disease without esophagitis: Secondary | ICD-10-CM

## 2019-10-10 DIAGNOSIS — R300 Dysuria: Secondary | ICD-10-CM

## 2019-10-10 DIAGNOSIS — E119 Type 2 diabetes mellitus without complications: Secondary | ICD-10-CM | POA: Diagnosis not present

## 2019-10-10 DIAGNOSIS — G4733 Obstructive sleep apnea (adult) (pediatric): Secondary | ICD-10-CM

## 2019-10-10 DIAGNOSIS — Z0000 Encounter for general adult medical examination without abnormal findings: Secondary | ICD-10-CM | POA: Diagnosis not present

## 2019-10-10 DIAGNOSIS — K5909 Other constipation: Secondary | ICD-10-CM

## 2019-10-10 LAB — MICROALBUMIN / CREATININE URINE RATIO
Creatinine,U: 136.2 mg/dL
Microalb Creat Ratio: 1.1 mg/g (ref 0.0–30.0)
Microalb, Ur: 1.4 mg/dL (ref 0.0–1.9)

## 2019-10-10 LAB — COMPREHENSIVE METABOLIC PANEL
ALT: 48 U/L (ref 0–53)
AST: 30 U/L (ref 0–37)
Albumin: 4.8 g/dL (ref 3.5–5.2)
Alkaline Phosphatase: 69 U/L (ref 39–117)
BUN: 11 mg/dL (ref 6–23)
CO2: 28 mEq/L (ref 19–32)
Calcium: 10.2 mg/dL (ref 8.4–10.5)
Chloride: 98 mEq/L (ref 96–112)
Creatinine, Ser: 0.94 mg/dL (ref 0.40–1.50)
GFR: 84.33 mL/min (ref >60.00)
Glucose, Bld: 113 mg/dL — ABNORMAL HIGH (ref 70–99)
Potassium: 4.1 mEq/L (ref 3.5–5.1)
Sodium: 136 mEq/L (ref 135–145)
Total Bilirubin: 1 mg/dL (ref 0.2–1.2)
Total Protein: 8.1 g/dL (ref 6.0–8.3)

## 2019-10-10 LAB — CBC WITH DIFFERENTIAL/PLATELET
Basophils Absolute: 0 10*3/uL (ref 0.0–0.1)
Basophils Relative: 0.7 % (ref 0.0–3.0)
Eosinophils Absolute: 0.1 10*3/uL (ref 0.0–0.7)
Eosinophils Relative: 1.8 % (ref 0.0–5.0)
HCT: 45.1 % (ref 39.0–52.0)
Hemoglobin: 15.6 g/dL (ref 13.0–17.0)
Lymphocytes Relative: 61.2 % — ABNORMAL HIGH (ref 12.0–46.0)
Lymphs Abs: 3.4 10*3/uL (ref 0.7–4.0)
MCHC: 34.5 g/dL (ref 30.0–36.0)
MCV: 82.7 fl (ref 78.0–100.0)
Monocytes Absolute: 0.4 10*3/uL (ref 0.1–1.0)
Monocytes Relative: 7.1 % (ref 3.0–12.0)
Neutro Abs: 1.6 10*3/uL (ref 1.4–7.7)
Neutrophils Relative %: 29.2 % — ABNORMAL LOW (ref 43.0–77.0)
Platelets: 226 10*3/uL (ref 150.0–400.0)
RBC: 5.45 Mil/uL (ref 4.22–5.81)
RDW: 13.4 % (ref 11.5–15.5)
WBC: 5.6 10*3/uL (ref 4.0–10.5)

## 2019-10-10 LAB — URINALYSIS, ROUTINE W REFLEX MICROSCOPIC
Bilirubin Urine: NEGATIVE
Hgb urine dipstick: NEGATIVE
Ketones, ur: NEGATIVE
Leukocytes,Ua: NEGATIVE
Nitrite: NEGATIVE
Specific Gravity, Urine: 1.02 (ref 1.000–1.030)
Total Protein, Urine: NEGATIVE
Urine Glucose: NEGATIVE
Urobilinogen, UA: 0.2 (ref 0.0–1.0)
WBC, UA: NONE SEEN (ref 0–?)
pH: 7.5 (ref 5.0–8.0)

## 2019-10-10 LAB — LIPID PANEL
Cholesterol: 141 mg/dL (ref 0–200)
HDL: 33.2 mg/dL — ABNORMAL LOW (ref >39.00)
LDL Cholesterol: 84 mg/dL (ref 0–99)
NonHDL: 108.04
Total CHOL/HDL Ratio: 4
Triglycerides: 119 mg/dL (ref 0.0–149.0)
VLDL: 23.8 mg/dL (ref 0.0–40.0)

## 2019-10-10 LAB — HEMOGLOBIN A1C: Hgb A1c MFr Bld: 7.5 % — ABNORMAL HIGH (ref 4.6–6.5)

## 2019-10-10 NOTE — Assessment & Plan Note (Signed)
Moderate Did not tolerate cpap Had used an oral appliance  - not using now Likely the cause of his fatigue Will refer to pulm to see if there are other options

## 2019-10-10 NOTE — Assessment & Plan Note (Signed)
GERD controlled Continue daily medication  

## 2019-10-10 NOTE — Assessment & Plan Note (Signed)
Fairly compliant with a diabetic diet stressed exercise Work on weight loss A1c, urine micro

## 2019-10-10 NOTE — Assessment & Plan Note (Signed)
Takes miralax daily Still constipated Start colace and metamucil

## 2019-10-10 NOTE — Assessment & Plan Note (Signed)
UA

## 2019-10-11 ENCOUNTER — Encounter: Payer: Self-pay | Admitting: Internal Medicine

## 2019-10-11 LAB — PSA, TOTAL AND FREE
PSA, % Free: 67 % (calc) (ref >25)
PSA, Free: 0.2 ng/mL
PSA, Total: 0.3 ng/mL (ref <=4.0)

## 2019-10-13 ENCOUNTER — Telehealth: Payer: Self-pay

## 2019-10-13 NOTE — Telephone Encounter (Signed)
Copied from [place] [id number]. Topic: General - Other >> Oct 12, 2019  3:54 PM Rainey Pines A wrote: Patient would like a callback from nurse today to go over lab results

## 2019-10-13 NOTE — Telephone Encounter (Signed)
Patient returning phone call please advise  °

## 2019-10-13 NOTE — Telephone Encounter (Signed)
LVM for pt to call back in regards.  

## 2019-10-14 ENCOUNTER — Other Ambulatory Visit: Payer: Self-pay

## 2019-10-14 MED ORDER — METFORMIN HCL 500 MG PO TABS: 500.0000 mg | ORAL_TABLET | Freq: Two times a day (BID) | ORAL | 1 refills | Status: DC

## 2019-10-14 NOTE — Telephone Encounter (Signed)
Spoke with pt about lab results sent through mychart. Pt expressed understanding and will try metformin 500 mg BID. Rx sent to pharmacy.

## 2019-11-08 ENCOUNTER — Other Ambulatory Visit: Payer: Self-pay | Admitting: Internal Medicine

## 2019-11-21 ENCOUNTER — Ambulatory Visit (INDEPENDENT_AMBULATORY_CARE_PROVIDER_SITE_OTHER): Payer: BC Managed Care – PPO | Admitting: Internal Medicine

## 2019-11-21 ENCOUNTER — Encounter: Payer: Self-pay | Admitting: Internal Medicine

## 2019-11-21 DIAGNOSIS — J029 Acute pharyngitis, unspecified: Secondary | ICD-10-CM | POA: Insufficient documentation

## 2019-11-21 DIAGNOSIS — J069 Acute upper respiratory infection, unspecified: Secondary | ICD-10-CM | POA: Insufficient documentation

## 2019-11-21 DIAGNOSIS — Z20828 Contact with and (suspected) exposure to other viral communicable diseases: Secondary | ICD-10-CM | POA: Diagnosis not present

## 2019-11-21 NOTE — Assessment & Plan Note (Signed)
Symptoms consistent with viral illness High risk for Covid-several people at work have Covid and he will try to get tested today Given that his symptoms do sound viral in nature symptomatic treatment only Continue increased rest and fluids Advised for him to let me know if his symptoms change, worsen or if he has any questions

## 2019-11-21 NOTE — Assessment & Plan Note (Signed)
Symptoms consistent with viral illness High risk for Covid-several people at work have Covid and he will try to get tested today Given that his symptoms do sound viral in nature symptomatic treatment only Continue increased rest and fluids Advised for him to let me know if his symptoms change, worsen or if he has any questions 

## 2019-11-21 NOTE — Progress Notes (Signed)
Virtual Visit via Video Note  I connected with [name] on 11/21/19 at  1:45 PM EST by a video enabled telemedicine application and verified that I am speaking with the correct person using two identifiers.   I discussed the limitations of evaluation and management by telemedicine and the availability of in person appointments. The patient expressed understanding and agreed to proceed.  Present for the visit:  Myself, Dr Cheryll Cockayne, [name].  The patient is currently at home and I am in the office.    No referring provider.    History of Present Illness: He is here for an acute visit for cold symptoms.  His symptoms started last week sometime. The symptoms have been worse the past three days.   He is experiencing fatigue, headaches, sore throat, burning in chest, cough and sob when laying down.  He has body aches.  He denies any fevers.  He has tried taking advil, nyquil  He did not go to work yesterday and today.  Many people at work have COVID.   Review of Systems  Constitutional: Positive for malaise/fatigue. Negative for fever.  HENT: Positive for sore throat. Negative for ear pain and sinus pain.   Respiratory: Positive for cough (mild, intermittent dry cough) and shortness of breath (sometimes when laying down). Negative for wheezing.   Cardiovascular: Positive for chest pain (burning pain in chest).  Gastrointestinal: Negative for diarrhea.  Musculoskeletal: Positive for myalgias.  Neurological: Positive for headaches.     Social History   Socioeconomic History  . Marital status: Married    Spouse name: Not on file  . Number of children: Not on file  . Years of education: Not on file  . Highest education level: Not on file  Occupational History  . Occupation: FORK LIFT DRIVER    Employer: [employer]  Tobacco Use  . Smoking status: Never Smoker  . Smokeless tobacco: Never Used  Substance and Sexual Activity  . Alcohol use: No  . Drug use: No   . Sexual activity: Not on file  Other Topics Concern  . Not on file  Social History Narrative   Occupation: Group Engineer, petroleum (deliveries)   Patient has never smoked.   Alcohol Use-no   Illicit Drug Use-no   Married-Divorced-Married again 2009-    3 children 2 sons and 1 daughter   Sudanese-E[place]ian heritage   No regular exercise   Social Determinants of Health   Financial Resource Strain:   . Difficulty of Paying Living Expenses: Not on file  Food Insecurity:   . Worried About Programme researcher, broadcasting/film/video in the Last Year: Not on file  . Ran Out of Food in the Last Year: Not on file  Transportation Needs:   . Lack of Transportation (Medical): Not on file  . Lack of Transportation (Non-Medical): Not on file  Physical Activity:   . Days of Exercise per Week: Not on file  . Minutes of Exercise per Session: Not on file  Stress:   . Feeling of Stress : Not on file  Social Connections:   . Frequency of Communication with Friends and Family: Not on file  . Frequency of Social Gatherings with Friends and Family: Not on file  . Attends Religious Services: Not on file  . Active Member of Clubs or Organizations: Not on file  . Attends Banker Meetings: Not on file  . Marital Status: Not on file     Observations/Objective: Appears well in NAD  Breathing normally Skin appears warm and dry  Assessment and Plan:  See Problem List for Assessment and Plan of chronic medical problems.   Follow Up Instructions:    I discussed the assessment and treatment plan with the patient. The patient was provided an opportunity to ask questions and all were answered. The patient agreed with the plan and demonstrated an understanding of the instructions.   The patient was advised to call back or seek an in-person evaluation if the symptoms worsen or if the condition fails to improve as anticipated.    Binnie Rail, MD

## 2019-11-24 ENCOUNTER — Telehealth: Payer: Self-pay | Admitting: Internal Medicine

## 2019-11-24 MED ORDER — SITAGLIPTIN PHOSPHATE 100 MG PO TABS: 100.0000 mg | ORAL_TABLET | Freq: Every day | ORAL | 5 refills | Status: DC

## 2019-11-24 NOTE — Telephone Encounter (Signed)
New med sent to [facility].  Stop metformin

## 2019-11-24 NOTE — Telephone Encounter (Signed)
Pt called and stated that medication MetFORMIN (GLUCOPHAGE) 500 MG tablet [[id number]]  is making him have diarrhea and would like to know if something else could be called in. Please advise

## 2019-11-25 NOTE — Telephone Encounter (Signed)
Left detailed message informing of below

## 2019-12-06 ENCOUNTER — Other Ambulatory Visit: Payer: Self-pay | Admitting: Internal Medicine

## 2020-02-01 ENCOUNTER — Other Ambulatory Visit: Payer: Self-pay

## 2020-02-01 MED ORDER — PANTOPRAZOLE SODIUM 40 MG PO TBEC: 40.0000 mg | DELAYED_RELEASE_TABLET | Freq: Every day | ORAL | 0 refills | Status: DC

## 2020-02-28 ENCOUNTER — Other Ambulatory Visit: Payer: Self-pay

## 2020-02-28 ENCOUNTER — Telehealth: Payer: Self-pay | Admitting: Internal Medicine

## 2020-02-28 ENCOUNTER — Ambulatory Visit (INDEPENDENT_AMBULATORY_CARE_PROVIDER_SITE_OTHER): Payer: BC Managed Care – PPO | Admitting: Gastroenterology

## 2020-02-28 ENCOUNTER — Ambulatory Visit (INDEPENDENT_AMBULATORY_CARE_PROVIDER_SITE_OTHER)
Admission: RE | Admit: 2020-02-28 | Discharge: 2020-02-28 | Disposition: A | Discharge status: Home / Self Care | Payer: BC Managed Care – PPO | Source: Ambulatory Visit | Attending: Gastroenterology | Admitting: Gastroenterology

## 2020-02-28 ENCOUNTER — Other Ambulatory Visit (INDEPENDENT_AMBULATORY_CARE_PROVIDER_SITE_OTHER): Payer: BC Managed Care – PPO

## 2020-02-28 ENCOUNTER — Encounter: Payer: Self-pay | Admitting: Gastroenterology

## 2020-02-28 VITALS — BP 110/70 | HR 68 | Temp 98.1°F | Ht 72.5 in | Wt 200.0 lb

## 2020-02-28 DIAGNOSIS — R110 Nausea: Secondary | ICD-10-CM

## 2020-02-28 DIAGNOSIS — R140 Abdominal distension (gaseous): Secondary | ICD-10-CM

## 2020-02-28 DIAGNOSIS — R1084 Generalized abdominal pain: Secondary | ICD-10-CM | POA: Diagnosis not present

## 2020-02-28 LAB — CBC WITH DIFFERENTIAL/PLATELET
Basophils Absolute: 0 10*3/uL (ref 0.0–0.1)
Basophils Relative: 0.9 % (ref 0.0–3.0)
Eosinophils Absolute: 0.1 10*3/uL (ref 0.0–0.7)
Eosinophils Relative: 1.9 % (ref 0.0–5.0)
HCT: 44.1 % (ref 39.0–52.0)
Hemoglobin: 15.1 g/dL (ref 13.0–17.0)
Lymphocytes Relative: 64.3 % — ABNORMAL HIGH (ref 12.0–46.0)
Lymphs Abs: 3 10*3/uL (ref 0.7–4.0)
MCHC: 34.2 g/dL (ref 30.0–36.0)
MCV: 83.5 fl (ref 78.0–100.0)
Monocytes Absolute: 0.4 10*3/uL (ref 0.1–1.0)
Monocytes Relative: 8.8 % (ref 3.0–12.0)
Neutro Abs: 1.1 10*3/uL — ABNORMAL LOW (ref 1.4–7.7)
Neutrophils Relative %: 24.1 % — ABNORMAL LOW (ref 43.0–77.0)
Platelets: 227 10*3/uL (ref 150.0–400.0)
RBC: 5.28 Mil/uL (ref 4.22–5.81)
RDW: 13.3 % (ref 11.5–15.5)
WBC: 4.6 10*3/uL (ref 4.0–10.5)

## 2020-02-28 LAB — BASIC METABOLIC PANEL
BUN: 13 mg/dL (ref 6–23)
CO2: 29 mEq/L (ref 19–32)
Calcium: 9.8 mg/dL (ref 8.4–10.5)
Chloride: 100 mEq/L (ref 96–112)
Creatinine, Ser: 0.99 mg/dL (ref 0.40–1.50)
GFR: 79.31 mL/min (ref >60.00)
Glucose, Bld: 102 mg/dL — ABNORMAL HIGH (ref 70–99)
Potassium: 3.8 mEq/L (ref 3.5–5.1)
Sodium: 136 mEq/L (ref 135–145)

## 2020-02-28 NOTE — Patient Instructions (Signed)
If you are age 52 or older, your body mass index should be between 23-30. Your Body mass index is 26.75 kg/m. If this is out of the aforementioned range listed, please consider follow up with your Primary Care Provider.  If you are age 59 or younger, your body mass index should be between 19-25. Your Body mass index is 26.75 kg/m. If this is out of the aformentioned range listed, please consider follow up with your Primary Care Provider.   Your provider has requested that you go to the basement level for lab work before leaving today. Press "B" on the elevator. The lab is located at the first door on the left as you exit the elevator.  Your provider has requested that you have an abdominal x ray before leaving today. Please go to the basement floor to our Radiology department for the test.

## 2020-02-28 NOTE — Telephone Encounter (Signed)
Difficult to assess patient over the phone, difficult understanding patient due to his accent:  Dr. Leone Payor, this patient has a history of SBO. He reports last BM "Friday or Saturday." Abd pain and tenderness started Saturday night, he stopped eating food Saturday night and but has continued ingesting scant fluid intake. Denies N/V, endorses abd "swelling" and being unable to pass anything including gas from the rectum until last night. The patient states he passed "a little water and gas" from the rectum. Opening in [facility] schedule at 11:30 am today, patient will be in for an OV. Heather CMA notified.

## 2020-02-28 NOTE — Progress Notes (Signed)
02/28/2020 [name] [id number] [birth year]   HISTORY OF PRESENT ILLNESS: This is a 54 year old male who is a patient of Dr. Celesta Aver.  He was an urgent add-on/work-in this morning to be evaluated regarding symptoms of possible small bowel obstruction, resolving obstruction.  He has history of small bowel obstruction secondary to adhesions.  He says that Saturday evening his abdomen became very distended and sounded like a drum.  Diffuse abdominal pain and nausea, though no vomiting (says that he usually does when this happens, however).  He says that he knows what these symptoms indicate.  He has taken very little clear liquids by mouth since that time.  He has passed some liquid stool last night and very small amount of flatus.  Abdominal distention has improved.  He says that he tries to stay out of the hospital if he can especially with Covid since he know the routine anyways and just tries to self treat at home.  Last colonoscopy with Dr. Carlean Purl November 2019 was normal.   Past Medical History:  Diagnosis Date  . Allergy   . Chest wall pain   . Cough   . Diabetes ([place])   . Diarrhea recurrent  . GERD (gastroesophageal reflux disease)   . Headache(784.0)   . Irritable bowel syndrome   . Obstructive sleep apnea    apnealink 10/23/10 AHI 5, Sp02 low 87%  . Postural lightheadedness   . SBO (small bowel obstruction) ([place]) 12/2015  . Sleep apnea   . Small bowel obstruction [facility])    Past Surgical History:  Procedure Laterality Date  . CHOLECYSTECTOMY  2009  . COLECTOMY  1994   segmental  . COLONOSCOPY  07/17/2011   hemorrhoids, otherwise normal including terminal ileum and random colon biopises  . GASTROSTOMY  2005   stamm  . LAPAROTOMY  2002, 2005, 2009  . SPHINCTEROTOMY  2009   lateral internal  . UPPER GASTROINTESTINAL ENDOSCOPY  07/17/2011   normal, including duodenal biopsies    reports that he has never smoked. He has never used smokeless tobacco. He reports that  he does not drink alcohol or use drugs. family history includes Hypertension in his mother; Liver disease in his father; Pancreatic cancer in his sister. Allergies  Allergen Reactions  . Metformin And Related     diarrhea      Outpatient Encounter Medications as of 02/28/2020  Medication Sig  . cetirizine (ZYRTEC) 10 MG tablet Take 10 mg by mouth daily as needed for allergies.  . pantoprazole (PROTONIX) 40 MG tablet Take 1 tablet (40 mg total) by mouth daily.  . [DISCONTINUED] sitaGLIPtin (JANUVIA) 100 MG tablet Take 1 tablet (100 mg total) by mouth daily.   No facility-administered encounter medications on file as of 02/28/2020.     REVIEW OF SYSTEMS  : All other systems reviewed and negative except where noted in the History of Present Illness.   PHYSICAL EXAM: BP 110/70 (BP Location: Left Arm, Patient Position: Sitting, Cuff Size: Normal)   Pulse 68   Temp 98.1 F (36.7 C)   Ht 6' 0.5" (1.842 m) Comment: height measured without shoes  Wt 200 lb (90.7 kg)   BMI 26.75 kg/m  General: Well developed male in no acute distress Head: Normocephalic and atraumatic Eyes:  Sclerae anicteric, conjunctiva pink. Ears: Normal auditory acuity Lungs: Clear throughout to auscultation; no increased WOB. Heart: Regular rate and rhythm; no M/R/G. Abdomen: Soft, non-distended.  BS present, but slightly hypoactive and tinkling.  Mild diffuse  TTP. Musculoskeletal: Symmetrical with no gross deformities  Skin: No lesions on visible extremities Extremities: No edema  Neurological: Alert oriented x 4, grossly non-focal Psychological:  Alert and cooperative. Normal mood and affect  ASSESSMENT AND PLAN: *54 year old male patient of Dr. Marvell Fuller with history of small bowel obstruction.  Reports abdominal distention, abdominal pain, nausea that began on Saturday evening.  At this point slightly improved.  He says he tries to stay out of the hospital if he can manage to do so.  Wants reassurance that  things are improving prior to increasing his p.o. intake.  So for the for the last 2 and half days he has had just very small amounts of clear liquids by mouth.  Will check labs today including CBC and BMP.  Will check 2-view abdominal x-ray stat in our facility.   CC:  [name], MD

## 2020-02-28 NOTE — Telephone Encounter (Signed)
Patient calling- states that he feels like there is a blockage in his intestine/obstruction in intestine. He would like to speak with you.

## 2020-03-12 ENCOUNTER — Telehealth: Payer: Self-pay | Admitting: Gastroenterology

## 2020-03-12 NOTE — Telephone Encounter (Signed)
Please advise 

## 2020-03-13 NOTE — Telephone Encounter (Signed)
I did not mention anything in my note, so I am not sure.  But for his alternating bowel habits he could try Benefiber/powder fiber supplement.  Start with 2 tablespoons mixed in 16 ounces of liquid daily.

## 2020-03-13 NOTE — Telephone Encounter (Signed)
Informed patient of information below. Patient voiced understanding.

## 2020-03-14 DIAGNOSIS — M542 Cervicalgia: Secondary | ICD-10-CM | POA: Diagnosis not present

## 2020-03-14 DIAGNOSIS — M5412 Radiculopathy, cervical region: Secondary | ICD-10-CM | POA: Diagnosis not present

## 2020-03-26 DIAGNOSIS — M5412 Radiculopathy, cervical region: Secondary | ICD-10-CM | POA: Diagnosis not present

## 2020-03-27 DIAGNOSIS — M542 Cervicalgia: Secondary | ICD-10-CM | POA: Diagnosis not present

## 2020-03-27 DIAGNOSIS — M5412 Radiculopathy, cervical region: Secondary | ICD-10-CM | POA: Diagnosis not present

## 2020-04-09 ENCOUNTER — Ambulatory Visit: Payer: BC Managed Care – PPO | Admitting: Internal Medicine

## 2020-04-10 DIAGNOSIS — M5412 Radiculopathy, cervical region: Secondary | ICD-10-CM | POA: Diagnosis not present

## 2020-04-24 DIAGNOSIS — M5412 Radiculopathy, cervical region: Secondary | ICD-10-CM | POA: Diagnosis not present

## 2020-04-30 ENCOUNTER — Encounter: Payer: Self-pay | Admitting: Internal Medicine

## 2020-04-30 ENCOUNTER — Telehealth (INDEPENDENT_AMBULATORY_CARE_PROVIDER_SITE_OTHER): Payer: BC Managed Care – PPO | Admitting: Internal Medicine

## 2020-04-30 DIAGNOSIS — J020 Streptococcal pharyngitis: Secondary | ICD-10-CM | POA: Diagnosis not present

## 2020-04-30 DIAGNOSIS — M5412 Radiculopathy, cervical region: Secondary | ICD-10-CM | POA: Diagnosis not present

## 2020-04-30 MED ORDER — AMOXICILLIN-POT CLAVULANATE 875-125 MG PO TABS: 1.0000 | ORAL_TABLET | Freq: Two times a day (BID) | ORAL | 0 refills | Status: DC

## 2020-04-30 MED ORDER — PROBIOTIC ACIDOPHILUS PO CAPS: ORAL_CAPSULE | ORAL | Status: DC

## 2020-04-30 NOTE — Progress Notes (Signed)
Virtual Visit via Video Note  I connected with [name] on 04/30/20 at  2:30 PM EDT by a video enabled telemedicine application and verified that I am speaking with the correct person using two identifiers.   I discussed the limitations of evaluation and management by telemedicine and the availability of in person appointments. The patient expressed understanding and agreed to proceed.  Present for the visit:  Myself, Dr Cheryll Cockayne, [name].  The patient is currently at home and I am in the office.    No referring provider.    History of Present Illness: This is an acute visit for sore throat.   He is having neck pain and on FMLA for it.  He is wearing a neck brace.  He needs surgery, but deferred that for now.    He has a sore throat.  His 2 kids have strep throat and are on antibiotics.    He is taking otc allergy medication.    Review of Systems  Constitutional: Negative for fever (feels warm).  HENT: Positive for sore throat. Negative for congestion and sinus pain.   Respiratory: Negative for cough and shortness of breath.   Neurological: Positive for headaches.      Social History   Socioeconomic History  . Marital status: Married    Spouse name: Not on file  . Number of children: Not on file  . Years of education: Not on file  . Highest education level: Not on file  Occupational History  . Occupation: FORK LIFT DRIVER    Employer: [employer]  Tobacco Use  . Smoking status: Never Smoker  . Smokeless tobacco: Never Used  Substance and Sexual Activity  . Alcohol use: No  . Drug use: No  . Sexual activity: Not on file  Other Topics Concern  . Not on file  Social History Narrative   Occupation: Group Engineer, petroleum (deliveries)   Patient has never smoked.   Alcohol Use-no   Illicit Drug Use-no   Married-Divorced-Married again 2009-    3 children 2 sons and 1 daughter   Sudanese-E[place]ian heritage   No regular exercise   Social  Determinants of Health   Financial Resource Strain:   . Difficulty of Paying Living Expenses:   Food Insecurity:   . Worried About Programme researcher, broadcasting/film/video in the Last Year:   . Barista in the Last Year:   Transportation Needs:   . Freight forwarder (Medical):   Marland Kitchen Lack of Transportation (Non-Medical):   Physical Activity:   . Days of Exercise per Week:   . Minutes of Exercise per Session:   Stress:   . Feeling of Stress :   Social Connections:   . Frequency of Communication with Friends and Family:   . Frequency of Social Gatherings with Friends and Family:   . Attends Religious Services:   . Active Member of Clubs or Organizations:   . Attends Banker Meetings:   Marland Kitchen Marital Status:      Observations/Objective: Appears well in NAD Has neck brace on Speaking in full sentences Skin warm and dry  Assessment and Plan:  See Problem List for Assessment and Plan of chronic medical problems.   Follow Up Instructions:    I discussed the assessment and treatment plan with the patient. The patient was provided an opportunity to ask questions and all were answered. The patient agreed with the plan and demonstrated an understanding of the instructions.  The patient was advised to call back or seek an in-person evaluation if the symptoms worsen or if the condition fails to improve as anticipated.    Binnie Rail, MD

## 2020-04-30 NOTE — Assessment & Plan Note (Signed)
Likely strep since his family has it Start augmentin otc cold medications Rest, fluid Call if no improvement

## 2020-05-06 ENCOUNTER — Other Ambulatory Visit: Payer: Self-pay | Admitting: Internal Medicine

## 2020-05-06 NOTE — Telephone Encounter (Signed)
Needs a f/u appt

## 2020-05-08 DIAGNOSIS — M5412 Radiculopathy, cervical region: Secondary | ICD-10-CM | POA: Diagnosis not present

## 2020-05-15 DIAGNOSIS — M5412 Radiculopathy, cervical region: Secondary | ICD-10-CM | POA: Diagnosis not present

## 2020-05-17 DIAGNOSIS — M5412 Radiculopathy, cervical region: Secondary | ICD-10-CM | POA: Diagnosis not present

## 2020-05-21 DIAGNOSIS — M5412 Radiculopathy, cervical region: Secondary | ICD-10-CM | POA: Diagnosis not present

## 2020-05-23 DIAGNOSIS — M5412 Radiculopathy, cervical region: Secondary | ICD-10-CM | POA: Diagnosis not present

## 2020-05-28 DIAGNOSIS — M5412 Radiculopathy, cervical region: Secondary | ICD-10-CM | POA: Diagnosis not present

## 2020-05-29 NOTE — Patient Instructions (Addendum)
Your a1c is 6.2%   Medications reviewed and updated.  Changes include :   none    Please followup in 6 months

## 2020-05-29 NOTE — Progress Notes (Signed)
Subjective:    Patient ID: [name], male    DOB: 02/04/1966, 54 y.o.   MRN: [id number]  HPI The patient is here for follow up of their chronic medical problems, including DM.  He has chronic neck pain and is doing PT and has had steroid injections.  It is better.  He wants to avoid surgery if possible.     Glucose is 140 here.  He has not eaten any sugar, but this morning ate cookies and milk.  He is eating a lot of fruit.  He is not exercising now due to his neck pain.   Medications and allergies reviewed with patient and updated if appropriate.  Patient Active Problem List   Diagnosis Date Noted  . Generalized abdominal pain 02/28/2020  . Abdominal distention 02/28/2020  . Chronic constipation 10/10/2019  . Acute left-sided low back pain 04/15/2019  . Chronic insomnia 03/24/2019  . Abdominal wall hernia 10/06/2018  . Arthralgia 06/01/2017  . Other chest pain 06/01/2017  . Fatigue 06/01/2017  . Diabetes ([facility]) 10/01/2016  . Meralgia paresthetica of right side 01/25/2016  . Vitamin D deficiency 01/25/2016  . SBO (small bowel obstruction) ([facility]) 01/02/2016  . Allergic rhinitis 02/07/2011  . OBSTRUCTIVE SLEEP APNEA 10/23/2010  . GERD 03/12/2010  . Irritable bowel syndrome 03/12/2010  . DIARRHEA, RECURRENT 02/05/2010    Current Outpatient Medications on File Prior to Visit  Medication Sig Dispense Refill  . cetirizine (ZYRTEC) 10 MG tablet Take 10 mg by mouth daily as needed for allergies.    Marland Kitchen gabapentin (NEURONTIN) 300 MG capsule Take 300 mg by mouth daily.    Marland Kitchen JANUVIA 100 MG tablet Take 100 mg by mouth daily.    . Lactobacillus (PROBIOTIC ACIDOPHILUS) CAPS Taking daily    . meloxicam (MOBIC) 15 MG tablet Take 15 mg by mouth daily.    . sildenafil (REVATIO) 20 MG tablet SMARTSIG:3-5 Tablet(s) By Mouth As Needed     No current facility-administered medications on file prior to visit.    Past Medical History:  Diagnosis Date  . Allergy   . Chest wall pain     . Cough   . Diabetes ([facility])   . Diarrhea recurrent  . GERD (gastroesophageal reflux disease)   . Headache(784.0)   . Irritable bowel syndrome   . Obstructive sleep apnea    apnealink 10/23/10 AHI 5, Sp02 low 87%  . Postural lightheadedness   . SBO (small bowel obstruction) ([facility]) 12/2015  . Sleep apnea   . Small bowel obstruction [facility])     Past Surgical History:  Procedure Laterality Date  . CHOLECYSTECTOMY  2009  . COLECTOMY  1994   segmental  . COLONOSCOPY  07/17/2011   hemorrhoids, otherwise normal including terminal ileum and random colon biopises  . GASTROSTOMY  2005   stamm  . LAPAROTOMY  2002, 2005, 2009  . SPHINCTEROTOMY  2009   lateral internal  . UPPER GASTROINTESTINAL ENDOSCOPY  07/17/2011   normal, including duodenal biopsies    Social History   Socioeconomic History  . Marital status: Married    Spouse name: Not on file  . Number of children: Not on file  . Years of education: Not on file  . Highest education level: Not on file  Occupational History  . Occupation: FORK LIFT DRIVER    Employer: [employer]  Tobacco Use  . Smoking status: Never Smoker  . Smokeless tobacco: Never Used  Vaping Use  . Vaping Use: Never used  Substance and Sexual Activity  . Alcohol use: No  . Drug use: No  . Sexual activity: Not on file  Other Topics Concern  . Not on file  Social History Narrative   Occupation: Group Archivist (deliveries)   Patient has never smoked.   Alcohol Use-no   Illicit Drug Use-no   Married-Divorced-Married again 2009-    3 children 2 sons and 1 daughter   Sudanese-E[place]ian heritage   No regular exercise   Social Determinants of Health   Financial Resource Strain:   . Difficulty of Paying Living Expenses:   Food Insecurity:   . Worried About Charity fundraiser in the Last Year:   . Arboriculturist in the Last Year:   Transportation Needs:   . Film/video editor (Medical):   Marland Kitchen Lack of Transportation  (Non-Medical):   Physical Activity:   . Days of Exercise per Week:   . Minutes of Exercise per Session:   Stress:   . Feeling of Stress :   Social Connections:   . Frequency of Communication with Friends and Family:   . Frequency of Social Gatherings with Friends and Family:   . Attends Religious Services:   . Active Member of Clubs or Organizations:   . Attends Archivist Meetings:   Marland Kitchen Marital Status:     Family History  Problem Relation Age of Onset  . Liver disease Father        liver cancer???  . Hypertension Mother   . Pancreatic cancer Sister   . Colon cancer Neg Hx   . Esophageal cancer Neg Hx   . Rectal cancer Neg Hx   . Stomach cancer Neg Hx     Review of Systems  Constitutional: Negative for fever.  Respiratory: Negative for cough, shortness of breath and wheezing.   Cardiovascular: Negative for chest pain, palpitations and leg swelling.  Gastrointestinal: Positive for constipation.  Musculoskeletal: Positive for neck pain.  Neurological: Positive for headaches (occ). Negative for light-headedness.       Objective:   Vitals:   05/30/20 1441  BP: 112/80  Pulse: 76  Temp: 98.3 F (36.8 C)  SpO2: 97%   BP Readings from Last 3 Encounters:  05/30/20 112/80  02/28/20 110/70  10/10/19 110/68   Wt Readings from Last 3 Encounters:  05/30/20 201 lb (91.2 kg)  02/28/20 200 lb (90.7 kg)  10/10/19 211 lb (95.7 kg)   Body mass index is 26.89 kg/m.   Physical Exam    Constitutional: Appears well-developed and well-nourished. No distress.  HENT:  Head: Normocephalic and atraumatic.  Neck: Neck supple. No tracheal deviation present. No thyromegaly present.  No cervical lymphadenopathy Cardiovascular: Normal rate, regular rhythm and normal heart sounds.   No murmur heard. No carotid bruit .  No edema Pulmonary/Chest: Effort normal and breath sounds normal. No respiratory distress. No has no wheezes. No rales.  Skin: Skin is warm and dry. Not  diaphoretic.  Psychiatric: Normal mood and affect. Behavior is normal.    Diabetic Foot Exam - Simple   Simple Foot Form Diabetic Foot exam was performed with the following findings: Yes 05/30/2020  3:05 PM  Visual Inspection No deformities, no ulcerations, no other skin breakdown bilaterally: Yes Sensation Testing Intact to touch and monofilament testing bilaterally: Yes Pulse Check Posterior Tibialis and Dorsalis pulse intact bilaterally: Yes Comments      Lab Results  Component Value Date   HGBA1C 6.2 (A) 05/30/2020   HGBA1C  6.2 05/30/2020   HGBA1C 6.2 05/30/2020     Assessment & Plan:    See Problem List for Assessment and Plan of chronic medical problems.    This visit occurred during the SARS-CoV-2 public health emergency.  Safety protocols were in place, including screening questions prior to the visit, additional usage of staff PPE, and extensive cleaning of exam room while observing appropriate contact time as indicated for disinfecting solutions.

## 2020-05-30 ENCOUNTER — Encounter: Payer: Self-pay | Admitting: Internal Medicine

## 2020-05-30 ENCOUNTER — Other Ambulatory Visit: Payer: Self-pay

## 2020-05-30 ENCOUNTER — Other Ambulatory Visit: Payer: Self-pay | Admitting: Internal Medicine

## 2020-05-30 ENCOUNTER — Ambulatory Visit (INDEPENDENT_AMBULATORY_CARE_PROVIDER_SITE_OTHER): Payer: BC Managed Care – PPO | Admitting: Internal Medicine

## 2020-05-30 VITALS — BP 112/80 | HR 76 | Temp 98.3°F | Ht 72.5 in | Wt 201.0 lb

## 2020-05-30 DIAGNOSIS — E119 Type 2 diabetes mellitus without complications: Secondary | ICD-10-CM

## 2020-05-30 DIAGNOSIS — K219 Gastro-esophageal reflux disease without esophagitis: Secondary | ICD-10-CM

## 2020-05-30 LAB — POCT GLUCOSE (DEVICE FOR HOME USE)
Glucose Fasting, POC: 140 mg/dL — AB (ref 70–99)
POC Glucose: 140 mg/dl — AB (ref 70–99)

## 2020-05-30 LAB — POCT GLYCOSYLATED HEMOGLOBIN (HGB A1C)
HbA1c POC (<> result, manual entry): 6.2 % (ref 4.0–5.6)
HbA1c, POC (controlled diabetic range): 6.2 % (ref 0.0–7.0)
Hemoglobin A1C: 6.2 % — AB (ref 4.0–5.6)

## 2020-05-30 MED ORDER — SITAGLIPTIN PHOSPHATE 100 MG PO TABS: 100.0000 mg | ORAL_TABLET | Freq: Every day | ORAL | 1 refills | Status: DC

## 2020-05-30 NOTE — Assessment & Plan Note (Signed)
Chronic GERD controlled Continue daily medication Pantoprazole daily 

## 2020-05-30 NOTE — Assessment & Plan Note (Addendum)
Chronic Taking januvia daily Check a1c -  6.2% here today --- controlled  - continue januvia Low sugar / carb diet Stressed regular exercise

## 2020-05-31 DIAGNOSIS — M5412 Radiculopathy, cervical region: Secondary | ICD-10-CM | POA: Diagnosis not present

## 2020-06-05 DIAGNOSIS — M5412 Radiculopathy, cervical region: Secondary | ICD-10-CM | POA: Diagnosis not present

## 2020-06-20 ENCOUNTER — Telehealth: Payer: Self-pay

## 2020-06-20 NOTE — Telephone Encounter (Signed)
New message    Pt c/o medication issue:  1. Name of Medication: sitaGLIPtin (JANUVIA) 100 MG tablet  2. How are you currently taking this medication (dosage and times per day)? Daily   3. Are you having a reaction (difficulty breathing--STAT)? No   4. What is your medication issue? The cost  $ 120.00 no refill left / looking for different options still a little metformin left will take that until Dr. [name] decided on next best option.

## 2020-06-21 MED ORDER — RYBELSUS 3 MG PO TABS: 3.0000 mg | ORAL_TABLET | Freq: Every day | ORAL | 2 refills | Status: DC

## 2020-06-21 NOTE — Telephone Encounter (Signed)
I sent a prescription to his pharmacy for rybelsus - if it is not covered or too expensive he should let me now.  If tolerated we can increase the dose

## 2020-06-21 NOTE — Telephone Encounter (Signed)
Left message for patient today with Dr. [name] recommendations.

## 2020-06-29 NOTE — Telephone Encounter (Signed)
    Patient can requesting prior auth for Rybelsus

## 2020-07-01 ENCOUNTER — Other Ambulatory Visit: Payer: Self-pay | Admitting: Internal Medicine

## 2020-07-03 NOTE — Telephone Encounter (Signed)
   Patient has no diabetic medication Patient checking the status of Rybelsus prior auth

## 2020-07-04 ENCOUNTER — Telehealth: Payer: Self-pay

## 2020-07-04 NOTE — Telephone Encounter (Signed)
[name] (Key: [id number])  [employer] has not yet replied to your PA request. You may close this dialog, return to your dashboard, and perform other tasks.  To check for an update later, open this request again from your dashboard.  If [employer] has not replied to your request within 24 hours please contact [employer] at [phone].

## 2020-07-04 NOTE — Telephone Encounter (Signed)
(  Copied from Cover My Meds)  Eydan Chianese (Key: [id number])  Your information has been sent to [employer].

## 2020-07-04 NOTE — Telephone Encounter (Signed)
Prior auth already started and patient has been informed.  He came by and picked up samples.  He is doing patient assistance for the Januvia to see if he can get it at a lower cost.

## 2020-07-04 NOTE — Telephone Encounter (Signed)
Left message for patient to let me know if he wants his paperwork mailed or if he wants to come by and pick it up.

## 2020-07-05 NOTE — Telephone Encounter (Addendum)
    Patient request paperwork be mailed back to the requesting company, stating an envelop with name was attached. Please call

## 2020-07-05 NOTE — Telephone Encounter (Signed)
Mailed out today.  Copies made and mailed out to patient.

## 2020-07-11 ENCOUNTER — Encounter: Payer: Self-pay | Admitting: Internal Medicine

## 2020-07-24 ENCOUNTER — Telehealth: Payer: Self-pay | Admitting: Internal Medicine

## 2020-07-24 NOTE — Telephone Encounter (Signed)
New message:    Pt is calling and states  Dr. [name] filled out a form for him about two weeks ago but it is some information missing. He states he needs her license number and a expiration date. Please advise.

## 2020-07-25 NOTE — Telephone Encounter (Signed)
New message:   Pt is calling back today and would like a call back to get the doctors NPI and expiration date for a form that she filled out for him a couple of weeks ago. Please advise.

## 2020-07-26 ENCOUNTER — Telehealth: Payer: Self-pay

## 2020-07-26 NOTE — Telephone Encounter (Signed)
Left message for patient to return call to office and provide me with the fax number on his paperwork so I can refax it. I mailed him out the original paperwork after it was faxed and didn't keep his fax conformation with the fax number.

## 2020-07-26 NOTE — Telephone Encounter (Signed)
F/U   The patient is asking the CMA to call him back to discuss form will discuss when the CMA calls back

## 2020-07-31 DIAGNOSIS — M5412 Radiculopathy, cervical region: Secondary | ICD-10-CM | POA: Diagnosis not present

## 2020-08-06 NOTE — Telephone Encounter (Signed)
Opened in error

## 2020-09-13 DIAGNOSIS — Z6828 Body mass index (BMI) 28.0-28.9, adult: Secondary | ICD-10-CM | POA: Diagnosis not present

## 2020-09-13 DIAGNOSIS — R030 Elevated blood-pressure reading, without diagnosis of hypertension: Secondary | ICD-10-CM | POA: Diagnosis not present

## 2020-09-13 DIAGNOSIS — M5412 Radiculopathy, cervical region: Secondary | ICD-10-CM | POA: Diagnosis not present

## 2020-09-28 DIAGNOSIS — M545 Low back pain, unspecified: Secondary | ICD-10-CM | POA: Diagnosis not present

## 2020-09-28 DIAGNOSIS — M5451 Vertebrogenic low back pain: Secondary | ICD-10-CM | POA: Diagnosis not present

## 2020-10-04 DIAGNOSIS — M5416 Radiculopathy, lumbar region: Secondary | ICD-10-CM | POA: Diagnosis not present

## 2020-10-09 DIAGNOSIS — M545 Low back pain, unspecified: Secondary | ICD-10-CM | POA: Diagnosis not present

## 2020-10-11 DIAGNOSIS — M545 Low back pain, unspecified: Secondary | ICD-10-CM | POA: Diagnosis not present

## 2020-10-14 NOTE — Patient Instructions (Addendum)
Blood work was ordered.    All other Health Maintenance issues reviewed.   All recommended immunizations and age-appropriate screenings are up-to-date or discussed.  No immunization administered today.   Medications reviewed and updated.  Changes include :   none    Please followup in 6 months    Health Maintenance, Male Adopting a healthy lifestyle and getting preventive care are important in promoting health and wellness. Ask your health care provider about:  The right schedule for you to have regular tests and exams.  Things you can do on your own to prevent diseases and keep yourself healthy. What should I know about diet, weight, and exercise? Eat a healthy diet   Eat a diet that includes plenty of vegetables, fruits, low-fat dairy products, and lean protein.  Do not eat a lot of foods that are high in solid fats, added sugars, or sodium. Maintain a healthy weight Body mass index (BMI) is a measurement that can be used to identify possible weight problems. It estimates body fat based on height and weight. Your health care provider can help determine your BMI and help you achieve or maintain a healthy weight. Get regular exercise Get regular exercise. This is one of the most important things you can do for your health. Most adults should:  Exercise for at least 150 minutes each week. The exercise should increase your heart rate and make you sweat (moderate-intensity exercise).  Do strengthening exercises at least twice a week. This is in addition to the moderate-intensity exercise.  Spend less time sitting. Even light physical activity can be beneficial. Watch cholesterol and blood lipids Have your blood tested for lipids and cholesterol at 52 years of age, then have this test every 5 years. You may need to have your cholesterol levels checked more often if:  Your lipid or cholesterol levels are high.  You are older than 52 years of age.  You are at high risk for  heart disease. What should I know about cancer screening? Many types of cancers can be detected early and may often be prevented. Depending on your health history and family history, you may need to have cancer screening at various ages. This may include screening for:  Colorectal cancer.  Prostate cancer.  Skin cancer.  Lung cancer. What should I know about heart disease, diabetes, and high blood pressure? Blood pressure and heart disease  High blood pressure causes heart disease and increases the risk of stroke. This is more likely to develop in people who have high blood pressure readings, are of African descent, or are overweight.  Talk with your health care provider about your target blood pressure readings.  Have your blood pressure checked: ? Every 3-5 years if you are 18-39 years of age. ? Every year if you are 40 years old or older.  If you are between the ages of 65 and 75 and are a current or former smoker, ask your health care provider if you should have a one-time screening for abdominal aortic aneurysm (AAA). Diabetes Have regular diabetes screenings. This checks your fasting blood sugar level. Have the screening done:  Once every three years after age 45 if you are at a normal weight and have a low risk for diabetes.  More often and at a younger age if you are overweight or have a high risk for diabetes. What should I know about preventing infection? Hepatitis B If you have a higher risk for hepatitis B, you should be screened for   this virus. Talk with your health care provider to find out if you are at risk for hepatitis B infection. Hepatitis C Blood testing is recommended for:  Everyone born from 1945 through 1965.  Anyone with known risk factors for hepatitis C. Sexually transmitted infections (STIs)  You should be screened each year for STIs, including gonorrhea and chlamydia, if: ? You are sexually active and are younger than 52 years of age. ? You are  older than 52 years of age and your health care provider tells you that you are at risk for this type of infection. ? Your sexual activity has changed since you were last screened, and you are at increased risk for chlamydia or gonorrhea. Ask your health care provider if you are at risk.  Ask your health care provider about whether you are at high risk for HIV. Your health care provider may recommend a prescription medicine to help prevent HIV infection. If you choose to take medicine to prevent HIV, you should first get tested for HIV. You should then be tested every 3 months for as long as you are taking the medicine. Follow these instructions at home: Lifestyle  Do not use any products that contain nicotine or tobacco, such as cigarettes, e-cigarettes, and chewing tobacco. If you need help quitting, ask your health care provider.  Do not use street drugs.  Do not share needles.  Ask your health care provider for help if you need support or information about quitting drugs. Alcohol use  Do not drink alcohol if your health care provider tells you not to drink.  If you drink alcohol: ? Limit how much you have to 0-2 drinks a day. ? Be aware of how much alcohol is in your drink. In the [place] one drink equals one 12 oz bottle of beer (355 mL), one 5 oz glass of wine (148 mL), or one 1 oz glass of hard liquor (44 mL). General instructions  Schedule regular health, dental, and eye exams.  Stay current with your vaccines.  Tell your health care provider if: ? You often feel depressed. ? You have ever been abused or do not feel safe at home. Summary  Adopting a healthy lifestyle and getting preventive care are important in promoting health and wellness.  Follow your health care provider's instructions about healthy diet, exercising, and getting tested or screened for diseases.  Follow your health care provider's instructions on monitoring your cholesterol and blood pressure. This  information is not intended to replace advice given to you by your health care provider. Make sure you discuss any questions you have with your health care provider. Document Revised: 11/17/2018 Document Reviewed: 11/17/2018 Elsevier Patient Education  2020 Elsevier Inc.  

## 2020-10-14 NOTE — Progress Notes (Signed)
Subjective:    Patient ID: [name], male    DOB: 1966/04/21, 54 y.o.   MRN: [id number]  HPI He is here for a physical exam.   He had an episode 2 weeks ago of severe back pain - he could not move or do anything.  He sees pain management tomorrow for his lower back pain.  He is doing PT. he is following with orthopedics.  Right now he is out of work because of this.  He is on short-term disability and may need to request long-term disability.  He is concerned he may lose his job.  He has chronic neck pain - he was told he needs surgery.  He has weakness in his right arm.    He feels he is eating fairly well.  He has not been able to exercise because of his back pain.  Medications and allergies reviewed with patient and updated if appropriate.  Patient Active Problem List   Diagnosis Date Noted  . Generalized abdominal pain 02/28/2020  . Abdominal distention 02/28/2020  . Chronic constipation 10/10/2019  . Acute left-sided low back pain 04/15/2019  . Chronic insomnia 03/24/2019  . Abdominal wall hernia 10/06/2018  . Arthralgia 06/01/2017  . Other chest pain 06/01/2017  . Fatigue 06/01/2017  . Diabetes (HCC) 10/01/2016  . Meralgia paresthetica of right side 01/25/2016  . Vitamin D deficiency 01/25/2016  . SBO (small bowel obstruction) (HCC) 01/02/2016  . Allergic rhinitis 02/07/2011  . OBSTRUCTIVE SLEEP APNEA 10/23/2010  . GERD 03/12/2010  . Irritable bowel syndrome 03/12/2010  . DIARRHEA, RECURRENT 02/05/2010    Current Outpatient Medications on File Prior to Visit  Medication Sig Dispense Refill  . cetirizine (ZYRTEC) 10 MG tablet Take 10 mg by mouth daily as needed for allergies.    Marland Kitchen gabapentin (NEURONTIN) 300 MG capsule Take 300 mg by mouth daily.    . Lactobacillus (PROBIOTIC ACIDOPHILUS) CAPS Taking daily    . meloxicam (MOBIC) 15 MG tablet Take 15 mg by mouth daily.    . pantoprazole (PROTONIX) 40 MG tablet Take 1 tablet (40 mg total) by mouth daily. 90  tablet 1  . Semaglutide (RYBELSUS) 3 MG TABS Take 3 mg by mouth daily before breakfast. 30 tablet 2  . sildenafil (REVATIO) 20 MG tablet SMARTSIG:3-5 Tablet(s) By Mouth As Needed     No current facility-administered medications on file prior to visit.    Past Medical History:  Diagnosis Date  . Allergy   . Chest wall pain   . Cough   . Diabetes (HCC)   . Diarrhea recurrent  . GERD (gastroesophageal reflux disease)   . Headache(784.0)   . Irritable bowel syndrome   . Obstructive sleep apnea    apnealink 10/23/10 AHI 5, Sp02 low 87%  . Postural lightheadedness   . SBO (small bowel obstruction) (HCC) 12/2015  . Sleep apnea   . Small bowel obstruction [facility])     Past Surgical History:  Procedure Laterality Date  . CHOLECYSTECTOMY  2009  . COLECTOMY  1994   segmental  . COLONOSCOPY  07/17/2011   hemorrhoids, otherwise normal including terminal ileum and random colon biopises  . GASTROSTOMY  2005   stamm  . LAPAROTOMY  2002, 2005, 2009  . SPHINCTEROTOMY  2009   lateral internal  . UPPER GASTROINTESTINAL ENDOSCOPY  07/17/2011   normal, including duodenal biopsies    Social History   Socioeconomic History  . Marital status: Married    Spouse name: Not on  file  . Number of children: Not on file  . Years of education: Not on file  . Highest education level: Not on file  Occupational History  . Occupation: FORK LIFT DRIVER    Employer: [employer]  Tobacco Use  . Smoking status: Never Smoker  . Smokeless tobacco: Never Used  Vaping Use  . Vaping Use: Never used  Substance and Sexual Activity  . Alcohol use: No  . Drug use: No  . Sexual activity: Not on file  Other Topics Concern  . Not on file  Social History Narrative   Occupation: Group Engineer, petroleum (deliveries)   Patient has never smoked.   Alcohol Use-no   Illicit Drug Use-no   Married-Divorced-Married again 2009-    3 children 2 sons and 1 daughter   [place] heritage   No regular  exercise   Social Determinants of Health   Financial Resource Strain:   . Difficulty of Paying Living Expenses: Not on file  Food Insecurity:   . Worried About Programme researcher, broadcasting/film/video in the Last Year: Not on file  . Ran Out of Food in the Last Year: Not on file  Transportation Needs:   . Lack of Transportation (Medical): Not on file  . Lack of Transportation (Non-Medical): Not on file  Physical Activity:   . Days of Exercise per Week: Not on file  . Minutes of Exercise per Session: Not on file  Stress:   . Feeling of Stress : Not on file  Social Connections:   . Frequency of Communication with Friends and Family: Not on file  . Frequency of Social Gatherings with Friends and Family: Not on file  . Attends Religious Services: Not on file  . Active Member of Clubs or Organizations: Not on file  . Attends Banker Meetings: Not on file  . Marital Status: Not on file    Family History  Problem Relation Age of Onset  . Liver disease Father        liver cancer???  . Hypertension Mother   . Pancreatic cancer Sister   . Colon cancer Neg Hx   . Esophageal cancer Neg Hx   . Rectal cancer Neg Hx   . Stomach cancer Neg Hx     Review of Systems  Constitutional: Negative for chills and fever.  Eyes: Negative for visual disturbance.  Respiratory: Negative for cough, shortness of breath and wheezing.   Cardiovascular: Negative for chest pain, palpitations and leg swelling.  Gastrointestinal: Positive for constipation. Negative for abdominal pain, blood in stool and diarrhea.       Gerd controlled  Genitourinary: Negative for dysuria, frequency and hematuria.       Some bladder pressure  Musculoskeletal: Positive for back pain and neck pain.  Skin: Negative for rash.  Neurological: Negative for light-headedness and headaches.  Psychiatric/Behavioral: Negative for dysphoric mood. The patient is not nervous/anxious.        Objective:   Vitals:   10/15/20 1105  BP:  122/78  Pulse: 70  Temp: 97.9 F (36.6 C)  SpO2: 97%   Filed Weights   10/15/20 1105  Weight: 216 lb (98 kg)   Body mass index is 28.89 kg/m.  BP Readings from Last 3 Encounters:  10/15/20 122/78  05/30/20 112/80  02/28/20 110/70    Wt Readings from Last 3 Encounters:  10/15/20 216 lb (98 kg)  05/30/20 201 lb (91.2 kg)  02/28/20 200 lb (90.7 kg)     Physical  Exam Constitutional: He appears well-developed and well-nourished. No distress.  HENT:  Head: Normocephalic and atraumatic.  Right Ear: External ear normal.  Left Ear: External ear normal.  Mouth/Throat: Oropharynx is clear and moist.  Normal ear canals and TM b/l.  Moderate cerumen right ear canal that is nonobstructing Eyes: Conjunctivae and EOM are normal.  Neck: Neck supple. No tracheal deviation present. No thyromegaly present.  No carotid bruit  Cardiovascular: Normal rate, regular rhythm, normal heart sounds and intact distal pulses.   No murmur heard. Pulmonary/Chest: Effort normal and breath sounds normal. No respiratory distress. He has no wheezes. He has no rales.  Abdominal: Soft. He exhibits no distension. There is no tenderness.  Genitourinary: deferred  Musculoskeletal: He exhibits no edema.  Lymphadenopathy:   He has no cervical adenopathy.  Skin: Skin is warm and dry. He is not diaphoretic.  Psychiatric: He has a normal mood and affect. His behavior is normal.         Assessment & Plan:   Physical exam: Screening blood work  ordered Immunizations  Had covid vaccine, others up to date Colonoscopy   Up to date  Eye exams   Due - will schedule Exercise   none Weight  Encouraged weight loss Substance abuse   none  See Problem List for Assessment and Plan of chronic medical problems.   This visit occurred during the SARS-CoV-2 public health emergency.  Safety protocols were in place, including screening questions prior to the visit, additional usage of staff PPE, and extensive cleaning  of exam room while observing appropriate contact time as indicated for disinfecting solutions.

## 2020-10-15 ENCOUNTER — Ambulatory Visit (INDEPENDENT_AMBULATORY_CARE_PROVIDER_SITE_OTHER): Payer: BC Managed Care – PPO | Admitting: Internal Medicine

## 2020-10-15 ENCOUNTER — Encounter: Payer: Self-pay | Admitting: Internal Medicine

## 2020-10-15 ENCOUNTER — Other Ambulatory Visit: Payer: Self-pay

## 2020-10-15 VITALS — BP 122/78 | HR 70 | Temp 97.9°F | Ht 72.5 in | Wt 216.0 lb

## 2020-10-15 DIAGNOSIS — K5909 Other constipation: Secondary | ICD-10-CM

## 2020-10-15 DIAGNOSIS — K219 Gastro-esophageal reflux disease without esophagitis: Secondary | ICD-10-CM | POA: Diagnosis not present

## 2020-10-15 DIAGNOSIS — R3989 Other symptoms and signs involving the genitourinary system: Secondary | ICD-10-CM

## 2020-10-15 DIAGNOSIS — Z0000 Encounter for general adult medical examination without abnormal findings: Secondary | ICD-10-CM | POA: Diagnosis not present

## 2020-10-15 DIAGNOSIS — E119 Type 2 diabetes mellitus without complications: Secondary | ICD-10-CM | POA: Diagnosis not present

## 2020-10-15 DIAGNOSIS — E559 Vitamin D deficiency, unspecified: Secondary | ICD-10-CM | POA: Diagnosis not present

## 2020-10-15 LAB — URINALYSIS, ROUTINE W REFLEX MICROSCOPIC
Bilirubin Urine: NEGATIVE
Ketones, ur: NEGATIVE
Leukocytes,Ua: NEGATIVE
Nitrite: NEGATIVE
Specific Gravity, Urine: 1.025 (ref 1.000–1.030)
Total Protein, Urine: NEGATIVE
Urine Glucose: NEGATIVE
Urobilinogen, UA: 0.2 (ref 0.0–1.0)
WBC, UA: NONE SEEN (ref 0–?)
pH: 6 (ref 5.0–8.0)

## 2020-10-15 LAB — HEMOGLOBIN A1C: Hgb A1c MFr Bld: 6.6 % — ABNORMAL HIGH (ref 4.6–6.5)

## 2020-10-15 LAB — COMPREHENSIVE METABOLIC PANEL
ALT: 28 U/L (ref 0–53)
AST: 21 U/L (ref 0–37)
Albumin: 4.4 g/dL (ref 3.5–5.2)
Alkaline Phosphatase: 56 U/L (ref 39–117)
BUN: 11 mg/dL (ref 6–23)
CO2: 29 mEq/L (ref 19–32)
Calcium: 9.6 mg/dL (ref 8.4–10.5)
Chloride: 100 mEq/L (ref 96–112)
Creatinine, Ser: 1.04 mg/dL (ref 0.40–1.50)
GFR: 82.36 mL/min (ref >60.00)
Glucose, Bld: 108 mg/dL — ABNORMAL HIGH (ref 70–99)
Potassium: 4.7 mEq/L (ref 3.5–5.1)
Sodium: 136 mEq/L (ref 135–145)
Total Bilirubin: 0.9 mg/dL (ref 0.2–1.2)
Total Protein: 7.4 g/dL (ref 6.0–8.3)

## 2020-10-15 LAB — LIPID PANEL
Cholesterol: 113 mg/dL (ref 0–200)
HDL: 32.9 mg/dL — ABNORMAL LOW (ref >39.00)
LDL Cholesterol: 61 mg/dL (ref 0–99)
NonHDL: 79.99
Total CHOL/HDL Ratio: 3
Triglycerides: 93 mg/dL (ref 0.0–149.0)
VLDL: 18.6 mg/dL (ref 0.0–40.0)

## 2020-10-15 LAB — MICROALBUMIN / CREATININE URINE RATIO
Creatinine,U: 100.5 mg/dL
Microalb Creat Ratio: 0.8 mg/g (ref 0.0–30.0)
Microalb, Ur: 0.8 mg/dL (ref 0.0–1.9)

## 2020-10-15 LAB — CBC WITH DIFFERENTIAL/PLATELET
Basophils Absolute: 0 10*3/uL (ref 0.0–0.1)
Basophils Relative: 0.6 % (ref 0.0–3.0)
Eosinophils Absolute: 0.1 10*3/uL (ref 0.0–0.7)
Eosinophils Relative: 1.5 % (ref 0.0–5.0)
HCT: 43.3 % (ref 39.0–52.0)
Hemoglobin: 14.8 g/dL (ref 13.0–17.0)
Lymphocytes Relative: 53.5 % — ABNORMAL HIGH (ref 12.0–46.0)
Lymphs Abs: 3 10*3/uL (ref 0.7–4.0)
MCHC: 34.3 g/dL (ref 30.0–36.0)
MCV: 82.4 fl (ref 78.0–100.0)
Monocytes Absolute: 0.4 10*3/uL (ref 0.1–1.0)
Monocytes Relative: 7.6 % (ref 3.0–12.0)
Neutro Abs: 2 10*3/uL (ref 1.4–7.7)
Neutrophils Relative %: 36.8 % — ABNORMAL LOW (ref 43.0–77.0)
Platelets: 211 10*3/uL (ref 150.0–400.0)
RBC: 5.25 Mil/uL (ref 4.22–5.81)
RDW: 13.5 % (ref 11.5–15.5)
WBC: 5.5 10*3/uL (ref 4.0–10.5)

## 2020-10-15 LAB — VITAMIN D 25 HYDROXY (VIT D DEFICIENCY, FRACTURES): VITD: 25.62 ng/mL — ABNORMAL LOW (ref 30.00–100.00)

## 2020-10-15 MED ORDER — SITAGLIPTIN PHOSPHATE 100 MG PO TABS: 100.0000 mg | ORAL_TABLET | Freq: Every day | ORAL | 1 refills | Status: DC

## 2020-10-15 NOTE — Assessment & Plan Note (Signed)
Chronic Continue Januvia 100 mg daily-this is covered by patient assistance Check A1c, urine microalbumin He is eating well, but has not been able to exercise due to back pain We will schedule eye exam

## 2020-10-15 NOTE — Assessment & Plan Note (Signed)
Chronic Not currently taking vitamin D-advised that he start taking this on a daily basis We will check level

## 2020-10-15 NOTE — Assessment & Plan Note (Signed)
Chronic Takes MiraLAX, but not daily-advised to take this daily since he still has constipation Could also take a stool softener on a daily basis

## 2020-10-15 NOTE — Assessment & Plan Note (Signed)
Chronic GERD controlled Continue pantoprazole 40 mg daily 

## 2020-10-16 DIAGNOSIS — M5459 Other low back pain: Secondary | ICD-10-CM | POA: Diagnosis not present

## 2020-10-16 DIAGNOSIS — M5416 Radiculopathy, lumbar region: Secondary | ICD-10-CM | POA: Diagnosis not present

## 2020-10-17 ENCOUNTER — Telehealth: Payer: Self-pay | Admitting: Internal Medicine

## 2020-10-17 DIAGNOSIS — M5412 Radiculopathy, cervical region: Secondary | ICD-10-CM | POA: Diagnosis not present

## 2020-10-17 NOTE — Telephone Encounter (Signed)
Patient calling to talk to him about his test results from 11.08.21

## 2020-10-17 NOTE — Telephone Encounter (Signed)
See result note - I sent to you.  His psa is still pending which is why I was waiting to comment on his labs, but I can comment on that separately.

## 2020-10-18 NOTE — Telephone Encounter (Signed)
Message left for patient and my-chart message sent with results also.

## 2020-10-24 DIAGNOSIS — M5412 Radiculopathy, cervical region: Secondary | ICD-10-CM | POA: Diagnosis not present

## 2020-10-26 NOTE — Telephone Encounter (Signed)
   Patient calling for PSA result

## 2020-10-30 ENCOUNTER — Telehealth: Payer: Self-pay

## 2020-10-30 DIAGNOSIS — R102 Pelvic and perineal pain: Secondary | ICD-10-CM | POA: Diagnosis not present

## 2020-10-30 DIAGNOSIS — N50812 Left testicular pain: Secondary | ICD-10-CM | POA: Diagnosis not present

## 2020-10-30 NOTE — Telephone Encounter (Signed)
Reached out [place] in the lab today to check on pending PSA.  [8:24 AM] [name] I called [facility]- [name] from [place] [facility] released it but apparently did not send any specimens to [facility]. They have no record of the patient having labs on 10/15/20. If she wants it and the urine culture we will have to call the patient back in.  I called and left message for patient that he would need to come back to the lab to have it drawn. I asked that he call back to schedule lab appointment.

## 2020-10-31 ENCOUNTER — Other Ambulatory Visit: Payer: BC Managed Care – PPO

## 2020-10-31 ENCOUNTER — Other Ambulatory Visit: Payer: Self-pay

## 2020-10-31 DIAGNOSIS — R3989 Other symptoms and signs involving the genitourinary system: Secondary | ICD-10-CM

## 2020-10-31 DIAGNOSIS — R319 Hematuria, unspecified: Secondary | ICD-10-CM

## 2020-11-02 ENCOUNTER — Other Ambulatory Visit: Payer: Self-pay | Admitting: Internal Medicine

## 2020-11-02 LAB — URINE CULTURE

## 2020-11-02 LAB — PSA, TOTAL AND FREE
PSA, % Free: 18 % (calc) — ABNORMAL LOW (ref >25)
PSA, Free: 0.2 ng/mL
PSA, Total: 1.1 ng/mL (ref <=4.0)

## 2020-11-07 DIAGNOSIS — M5412 Radiculopathy, cervical region: Secondary | ICD-10-CM | POA: Diagnosis not present

## 2020-11-09 DIAGNOSIS — E119 Type 2 diabetes mellitus without complications: Secondary | ICD-10-CM | POA: Diagnosis not present

## 2020-11-12 DIAGNOSIS — M545 Low back pain, unspecified: Secondary | ICD-10-CM | POA: Diagnosis not present

## 2020-11-14 DIAGNOSIS — M5412 Radiculopathy, cervical region: Secondary | ICD-10-CM | POA: Diagnosis not present

## 2020-11-14 DIAGNOSIS — M5136 Other intervertebral disc degeneration, lumbar region: Secondary | ICD-10-CM | POA: Diagnosis not present

## 2020-11-14 DIAGNOSIS — M5416 Radiculopathy, lumbar region: Secondary | ICD-10-CM | POA: Diagnosis not present

## 2020-11-23 DIAGNOSIS — U071 COVID-19: Secondary | ICD-10-CM | POA: Diagnosis not present

## 2020-11-26 ENCOUNTER — Other Ambulatory Visit: Payer: Self-pay | Admitting: Internal Medicine

## 2020-12-03 ENCOUNTER — Encounter: Payer: BC Managed Care – PPO | Admitting: Internal Medicine

## 2020-12-05 ENCOUNTER — Telehealth: Payer: Self-pay | Admitting: Internal Medicine

## 2020-12-05 NOTE — Telephone Encounter (Signed)
Patient called and said that he test positive for Covid 19 two weeks ago and was wondering if Dr. [name] could place a chest x-ray order. Please call the patient at [phone].

## 2020-12-06 DIAGNOSIS — M5417 Radiculopathy, lumbosacral region: Secondary | ICD-10-CM | POA: Diagnosis not present

## 2020-12-06 NOTE — Telephone Encounter (Signed)
He will need a virtual visit 

## 2020-12-10 ENCOUNTER — Telehealth: Payer: Self-pay | Admitting: Internal Medicine

## 2020-12-10 ENCOUNTER — Other Ambulatory Visit: Payer: Self-pay

## 2020-12-10 ENCOUNTER — Encounter: Payer: Self-pay | Admitting: Internal Medicine

## 2020-12-10 ENCOUNTER — Telehealth (INDEPENDENT_AMBULATORY_CARE_PROVIDER_SITE_OTHER): Payer: BC Managed Care – PPO | Admitting: Internal Medicine

## 2020-12-10 ENCOUNTER — Ambulatory Visit (INDEPENDENT_AMBULATORY_CARE_PROVIDER_SITE_OTHER): Payer: BC Managed Care – PPO

## 2020-12-10 DIAGNOSIS — R0602 Shortness of breath: Secondary | ICD-10-CM | POA: Diagnosis not present

## 2020-12-10 DIAGNOSIS — R0789 Other chest pain: Secondary | ICD-10-CM | POA: Diagnosis not present

## 2020-12-10 DIAGNOSIS — K219 Gastro-esophageal reflux disease without esophagitis: Secondary | ICD-10-CM | POA: Diagnosis not present

## 2020-12-10 DIAGNOSIS — R079 Chest pain, unspecified: Secondary | ICD-10-CM | POA: Diagnosis not present

## 2020-12-10 DIAGNOSIS — R059 Cough, unspecified: Secondary | ICD-10-CM | POA: Diagnosis not present

## 2020-12-10 MED ORDER — PANTOPRAZOLE SODIUM 40 MG PO TBEC: 40.0000 mg | DELAYED_RELEASE_TABLET | Freq: Two times a day (BID) | ORAL | 1 refills | Status: DC

## 2020-12-10 NOTE — Telephone Encounter (Signed)
Patient calling for xray results  °

## 2020-12-10 NOTE — Assessment & Plan Note (Signed)
Acute Started 3 days ago, worse at night when he lays down to go to bed - burning sensation.  Has dry cough and mild sob. Had covid - symptoms started 12/4 - he did feel better for a few days Has been taking ibuprofen, had recent steroid injections and tylenol Unlikely related to covid - was feeling better, unlikely bacterial bronchitis or PNA but will check CXR Possibly related to GERD - from ibuprofen and steroid  Hold off on abx until CXR Increase pantoprazole to BID

## 2020-12-10 NOTE — Telephone Encounter (Signed)
Message left for patient to contact office to schedule virtual visit. Please schedule if he calls.

## 2020-12-10 NOTE — Assessment & Plan Note (Signed)
Chronic I believe his chest pain - burning sensation is likely related to uncontrolled GERD Increase protonix 40 mg daily to twice daily

## 2020-12-10 NOTE — Progress Notes (Signed)
Virtual Visit via Video Note  I connected with [name] on 12/10/20 at  1:45 PM EST by a video enabled telemedicine application and verified that I am speaking with the correct person using two identifiers.   I discussed the limitations of evaluation and management by telemedicine and the availability of in person appointments. The patient expressed understanding and agreed to proceed.  Present for the visit:  Myself, Dr Cheryll Cockayne, [name].  The patient is currently at home and I am in the office.    No referring provider.    History of Present Illness: This is an acute visit for covid issues.   He was tested for covid at urgent care on 12/17 and it was positive.  His symptoms started around 12/14.  His son was exposed at school.  He has been taking otc cold medications.    He has no taste or smell.  He felt ok for a few days, but the past two-three nights he has had chest pain/burning ( left and middle upper chest), cough, mild SOB when he is going to sleep.    He feels the symptoms during the day, but it is worse at night. He is worried about an infection.    He did have recent steroid injections in his back.  He is taking ibuprofen and tylenol.  He takes the protonix daily.  He feels his GERD is controlled.      Review of Systems  Constitutional: Negative for fever.  HENT: Negative for sore throat.        Voice is a little different  Respiratory: Positive for cough (dry), shortness of breath (mild) and wheezing (a little ).   Cardiovascular: Positive for chest pain (burning and pain in mid and left upper chest).  Gastrointestinal: Negative for heartburn.  Neurological: Negative for headaches.      Social History   Socioeconomic History  . Marital status: Married    Spouse name: Not on file  . Number of children: Not on file  . Years of education: Not on file  . Highest education level: Not on file  Occupational History  . Occupation: FORK LIFT DRIVER     Employer: [employer]  Tobacco Use  . Smoking status: Never Smoker  . Smokeless tobacco: Never Used  Vaping Use  . Vaping Use: Never used  Substance and Sexual Activity  . Alcohol use: No  . Drug use: No  . Sexual activity: Not on file  Other Topics Concern  . Not on file  Social History Narrative   Occupation: Group Engineer, petroleum (deliveries)   Patient has never smoked.   Alcohol Use-no   Illicit Drug Use-no   Married-Divorced-Married again 2009-    3 children 2 sons and 1 daughter   [place] heritage   No regular exercise   Social Determinants of Health   Financial Resource Strain: Not on file  Food Insecurity: Not on file  Transportation Needs: Not on file  Physical Activity: Not on file  Stress: Not on file  Social Connections: Not on file     Observations/Objective: Appears well in NAD Breathing is normal   Assessment and Plan:  See Problem List for Assessment and Plan of chronic medical problems.   Follow Up Instructions:    I discussed the assessment and treatment plan with the patient. The patient was provided an opportunity to ask questions and all were answered. The patient agreed with the plan and demonstrated an understanding of  the instructions.   The patient was advised to call back or seek an in-person evaluation if the symptoms worsen or if the condition fails to improve as anticipated.    Binnie Rail, MD

## 2020-12-10 NOTE — Telephone Encounter (Signed)
Patient will be notified of results when they come back. Xray just ordered this afternoon and we need to give it time.

## 2020-12-18 ENCOUNTER — Telehealth: Payer: Self-pay | Admitting: Internal Medicine

## 2020-12-18 DIAGNOSIS — R0789 Other chest pain: Secondary | ICD-10-CM

## 2020-12-18 NOTE — Telephone Encounter (Signed)
Symptoms not heartburn his chest x-ray was normal is he willing to see cardiology for further evaluation?

## 2020-12-18 NOTE — Telephone Encounter (Signed)
Patient calling stating he is still experience the same symptoms he was having on 01.03.22. States he has been taking the medication he was given and he doubled up on them to see if it would make him feel better. Patient unable to tell me which medication.

## 2020-12-24 ENCOUNTER — Telehealth: Payer: Self-pay | Admitting: Internal Medicine

## 2020-12-24 DIAGNOSIS — R0602 Shortness of breath: Secondary | ICD-10-CM

## 2020-12-24 NOTE — Telephone Encounter (Signed)
Referral to pulmonary for congestion and breathing problems  Patient said Pulmonary said he needed a new referral  Please call the patient at [phone]

## 2021-01-02 DIAGNOSIS — M5416 Radiculopathy, lumbar region: Secondary | ICD-10-CM | POA: Diagnosis not present

## 2021-01-02 DIAGNOSIS — M5459 Other low back pain: Secondary | ICD-10-CM | POA: Diagnosis not present

## 2021-01-08 ENCOUNTER — Other Ambulatory Visit: Payer: Self-pay

## 2021-01-08 ENCOUNTER — Ambulatory Visit (INDEPENDENT_AMBULATORY_CARE_PROVIDER_SITE_OTHER): Payer: BC Managed Care – PPO | Admitting: Primary Care

## 2021-01-08 ENCOUNTER — Encounter: Payer: Self-pay | Admitting: Primary Care

## 2021-01-08 ENCOUNTER — Ambulatory Visit: Payer: BC Managed Care – PPO

## 2021-01-08 ENCOUNTER — Encounter: Payer: Self-pay | Admitting: General Practice

## 2021-01-08 VITALS — BP 106/70 | HR 74 | Temp 98.4°F | Ht 72.5 in | Wt 213.0 lb

## 2021-01-08 DIAGNOSIS — R0609 Other forms of dyspnea: Secondary | ICD-10-CM | POA: Diagnosis not present

## 2021-01-08 DIAGNOSIS — R0600 Dyspnea, unspecified: Secondary | ICD-10-CM | POA: Diagnosis not present

## 2021-01-08 DIAGNOSIS — R0602 Shortness of breath: Secondary | ICD-10-CM

## 2021-01-08 DIAGNOSIS — B948 Sequelae of other specified infectious and parasitic diseases: Secondary | ICD-10-CM | POA: Diagnosis not present

## 2021-01-08 DIAGNOSIS — U099 Post covid-19 condition, unspecified: Secondary | ICD-10-CM

## 2021-01-08 LAB — D-DIMER, QUANTITATIVE: D-Dimer, Quant: 0.23 mcg/mL FEU (ref <0.50)

## 2021-01-08 MED ORDER — BUDESONIDE-FORMOTEROL FUMARATE 80-4.5 MCG/ACT IN AERO: 2.0000 | INHALATION_SPRAY | Freq: Two times a day (BID) | RESPIRATORY_TRACT | 2 refills | Status: DC

## 2021-01-08 NOTE — Patient Instructions (Addendum)
  Recommendations: Trial Symbicort - take 2 puffs morning and evening  If this helps continue to use as instructed. If no improvement can stop after 2 weeks.   Orders: Pulmonary function testing   Rx: Symbicort 80  Follow-up: 4-6 weeks with Dr. Craige Cotta or his first available

## 2021-01-08 NOTE — Progress Notes (Signed)
@Patient  ID: , male    DOB: November 24, 1966, 55 y.o.   MRN: [id number]  Chief Complaint  Patient presents with  . Follow-up    Increased SOB for the past 2 months. Was diagnosed with COVID 2 months ago. Increased chest pressure on the right side. Non-productive cough.     Referring provider: [id number], MD  HPI: 55 year old male, never smoked.  Past medical history significant for obstructive sleep apnea, allergic rhinitis, GERD, irritable bowel syndrome, vitamin D deficiency.  Patient of Dr. 91, last seen in October 2018 for sleep apnea.  01/08/2021 Patient presents today for an acute visit. He reports increased shortness of breath since he had covid in December 2021. He also has been experiencing symptoms of chest burning, dry cough and wheezing. He has been taking Protonix twice a day without improvement. These symptoms all started after covid infection. Shortness of breath occurs on exertion and when laying down at night.    Allergies  Allergen Reactions  . Metformin And Related     diarrhea    Immunization History  Administered Date(s) Administered  . Influenza Inj Mdck Quad Pf 08/25/2018  . Influenza Split 08/10/2017  . Influenza Whole 09/07/2010  . Influenza,inj,Quad PF,6+ Mos 08/18/2017, 08/29/2019, 08/29/2020  . Influenza-Unspecified 08/20/2016  . Pneumococcal Polysaccharide-23 12/04/2016  . Tdap 04/17/2011  . Zoster Recombinat (Shingrix) 10/06/2018    Past Medical History:  Diagnosis Date  . Allergy   . Chest wall pain   . Cough   . Diabetes (HCC)   . Diarrhea recurrent  . GERD (gastroesophageal reflux disease)   . Headache(784.0)   . Irritable bowel syndrome   . Obstructive sleep apnea    apnealink 10/23/10 AHI 5, Sp02 low 87%  . Postural lightheadedness   . SBO (small bowel obstruction) (HCC) 12/2015  . Sleep apnea   . Small bowel obstruction (HCC)     Tobacco History: Social History   Tobacco Use  Smoking Status Never Smoker   Smokeless Tobacco Never Used   Counseling given: Not Answered   Outpatient Medications Prior to Visit  Medication Sig Dispense Refill  . cetirizine (ZYRTEC) 10 MG tablet Take 10 mg by mouth daily as needed for allergies.    01/2016 gabapentin (NEURONTIN) 300 MG capsule Take 300 mg by mouth daily.    . Lactobacillus (PROBIOTIC ACIDOPHILUS) CAPS Taking daily    . meloxicam (MOBIC) 15 MG tablet Take 15 mg by mouth daily.    . pantoprazole (PROTONIX) 40 MG tablet Take 1 tablet (40 mg total) by mouth 2 (two) times daily before a meal. 90 tablet 1  . sitaGLIPtin (JANUVIA) 100 MG tablet Take 1 tablet (100 mg total) by mouth daily. 90 tablet 1  . methocarbamol (ROBAXIN) 500 MG tablet Take 500 mg by mouth 3 (three) times daily.    . sildenafil (REVATIO) 20 MG tablet TAKE 3 TO 5 TABLETS BY MOUTH AS NEEDED 5 tablet 5   No facility-administered medications prior to visit.    Review of Systems  Review of Systems  Constitutional: Negative.   Respiratory: Positive for cough, chest tightness and wheezing.   Cardiovascular: Negative.    Physical Exam  BP 106/70   Pulse 74   Temp 98.4 F (36.9 C) (Temporal)   Ht 6' 0.5" (1.842 m)   Wt 213 lb (96.6 kg)   SpO2 96% Comment: on RA  BMI 28.49 kg/m  Physical Exam Constitutional:      Appearance: Normal appearance.  HENT:  Mouth/Throat:     Comments: Deferred d/t masking Cardiovascular:     Rate and Rhythm: Normal rate.     Comments: Regularly irregular; No leg swelling Pulmonary:     Effort: Pulmonary effort is normal.     Breath sounds: Normal breath sounds.     Comments: CTA Musculoskeletal:        General: Normal range of motion.  Skin:    General: Skin is warm and dry.  Neurological:     General: No focal deficit present.     Mental Status: He is alert and oriented to person, place, and time. Mental status is at baseline.  Psychiatric:        Mood and Affect: Mood normal.        Behavior: Behavior normal.        Thought  Content: Thought content normal.        Judgment: Judgment normal.      Lab Results:  CBC    Component Value Date/Time   WBC 5.3 01/08/2021 1642   RBC 5.23 01/08/2021 1642   HGB 14.8 01/08/2021 1642   HCT 43.4 01/08/2021 1642   PLT 232.0 01/08/2021 1642   MCV 83.0 01/08/2021 1642   MCV 90.3 01/23/2014 1656   MCH 28.7 04/09/2018 1242   MCHC 34.0 01/08/2021 1642   RDW 13.7 01/08/2021 1642   LYMPHSABS 3.2 01/08/2021 1642   MONOABS 0.4 01/08/2021 1642   EOSABS 0.1 01/08/2021 1642   BASOSABS 0.0 01/08/2021 1642    BMET    Component Value Date/Time   NA 136 10/15/2020 1159   K 4.7 10/15/2020 1159   CL 100 10/15/2020 1159   CO2 29 10/15/2020 1159   GLUCOSE 108 (H) 10/15/2020 1159   BUN 11 10/15/2020 1159   CREATININE 1.04 10/15/2020 1159   CREATININE 0.94 06/04/2014 1734   CALCIUM 9.6 10/15/2020 1159   GFRNONAA >60 04/09/2018 1242   GFRAA >60 04/09/2018 1242    BNP No results found for: BNP  ProBNP No results found for: PROBNP  Imaging: No results found.   Assessment & Plan:   Post-COVID chronic dyspnea Shortness of breath, dry cough and wheezing started after covid infection in December 2021. He had a normal CXR in January 2022.  D-dimer was normal today. Recommend trial low dose Symbicort. Ordering full PFTs to assess lung function and diffusion capacity. If no improvement with inhaler sample would consider checking echocardiogram. He may benefit from physical therapy/rehab program to work on conditioning.   Orders: Pulmonary function testing   Rx: Symbicort 80 two puffs twice day   Follow-up: 4-6 weeks with Dr. Craige Cotta or his first available     Glenford Bayley, NP 01/10/2021

## 2021-01-09 LAB — CBC WITH DIFFERENTIAL/PLATELET
Basophils Absolute: 0 10*3/uL (ref 0.0–0.1)
Basophils Relative: 0.7 % (ref 0.0–3.0)
Eosinophils Absolute: 0.1 10*3/uL (ref 0.0–0.7)
Eosinophils Relative: 2.7 % (ref 0.0–5.0)
HCT: 43.4 % (ref 39.0–52.0)
Hemoglobin: 14.8 g/dL (ref 13.0–17.0)
Lymphocytes Relative: 60.9 % — ABNORMAL HIGH (ref 12.0–46.0)
Lymphs Abs: 3.2 10*3/uL (ref 0.7–4.0)
MCHC: 34 g/dL (ref 30.0–36.0)
MCV: 83 fl (ref 78.0–100.0)
Monocytes Absolute: 0.4 10*3/uL (ref 0.1–1.0)
Monocytes Relative: 6.7 % (ref 3.0–12.0)
Neutro Abs: 1.5 10*3/uL (ref 1.4–7.7)
Neutrophils Relative %: 29 % — ABNORMAL LOW (ref 43.0–77.0)
Platelets: 232 10*3/uL (ref 150.0–400.0)
RBC: 5.23 Mil/uL (ref 4.22–5.81)
RDW: 13.7 % (ref 11.5–15.5)
WBC: 5.3 10*3/uL (ref 4.0–10.5)

## 2021-01-10 ENCOUNTER — Encounter: Payer: Self-pay | Admitting: Primary Care

## 2021-01-10 DIAGNOSIS — U099 Post covid-19 condition, unspecified: Secondary | ICD-10-CM | POA: Insufficient documentation

## 2021-01-10 NOTE — Progress Notes (Signed)
Reviewed and agree with assessment/plan.   Coralyn Helling, MD [facility] Pulmonary/Critical Care 01/10/2021, 1:58 PM Pager:  [phone]

## 2021-01-10 NOTE — Assessment & Plan Note (Addendum)
Shortness of breath, dry cough and wheezing started after covid infection in December 2021. He had a normal CXR in January 2022.  D-dimer was normal today. Recommend trial low dose Symbicort. Ordering full PFTs to assess lung function and diffusion capacity. If no improvement with inhaler sample would consider checking echocardiogram. He may benefit from physical therapy/rehab program to work on conditioning.   Orders: Pulmonary function testing   Rx: Symbicort 80 two puffs twice day   Follow-up: 4-6 weeks with Dr. Craige Cotta or his first available

## 2021-01-11 ENCOUNTER — Telehealth: Payer: Self-pay | Admitting: Pulmonary Disease

## 2021-01-11 NOTE — Telephone Encounter (Signed)
Called patient to get him scheduled for a covid test before his PFT for next month. He did not answer. Left message for him to call back.

## 2021-01-22 ENCOUNTER — Other Ambulatory Visit: Payer: Self-pay

## 2021-01-22 ENCOUNTER — Encounter: Payer: Self-pay | Admitting: Cardiology

## 2021-01-22 ENCOUNTER — Ambulatory Visit (INDEPENDENT_AMBULATORY_CARE_PROVIDER_SITE_OTHER): Payer: BC Managed Care – PPO | Admitting: Cardiology

## 2021-01-22 VITALS — BP 120/70 | HR 50 | Ht 73.0 in | Wt 210.2 lb

## 2021-01-22 DIAGNOSIS — U099 Post covid-19 condition, unspecified: Secondary | ICD-10-CM

## 2021-01-22 DIAGNOSIS — Z7189 Other specified counseling: Secondary | ICD-10-CM | POA: Diagnosis not present

## 2021-01-22 DIAGNOSIS — R079 Chest pain, unspecified: Secondary | ICD-10-CM | POA: Diagnosis not present

## 2021-01-22 DIAGNOSIS — E119 Type 2 diabetes mellitus without complications: Secondary | ICD-10-CM

## 2021-01-22 DIAGNOSIS — R0609 Other forms of dyspnea: Secondary | ICD-10-CM | POA: Diagnosis not present

## 2021-01-22 DIAGNOSIS — I4510 Unspecified right bundle-branch block: Secondary | ICD-10-CM

## 2021-01-22 NOTE — Progress Notes (Signed)
Cardiology Office Note:    Date:  01/22/2021   ID:  [name], DOB [birth year], MRN [id number]  PCP:  Steven Sanes, MD  Cardiologist:  Steven Red, MD  Referring MD: Steven Sanes, MD   CC: new patient consultation for chest pain  History of Present Illness:    [name] is a 55 y.o. male with a hx of type II diabetes, OSA who is seen as a new consult at the request of Burns, [name], MD for the evaluation and management of chest pain.  Note from Dr. Lawerance [name] dated 12/10/20 reviewed. The visit was an acute visit for Covid symptoms. Noted to have burning pain in upper chest.   Today: Has been out of work for several months due to orthopedic issues. Has been fully vaccinated, got Covid when his child brought it home 11/2020. Saw pulmonology 01/08/21, started on inhaler, has follow up next month for this. Has felt very fatigued, short of breath sinuce Covid.  Chest pain: -Initial onset: 11/2020 -Quality: sharp, left sided, under breast. Worst at night time -Frequency/duration: nearly constant, worse at night -Associated symptoms: has felt short of breath since Covid as well. Has been told he has OSA, didn't tolerate CPAP -Aggravating/alleviating factors: no clear factors -Prior cardiac history: no issues that he knows of -Prior workup/treatment: ETT stress >10 years ago -Alcohol: never -Tobacco: never -Comorbidities: type II diabetes, on Januvia. Has had multiple surgeries for scar tissue removal, has had issues with GERD in the past. -Exercise level: very minimal due to orthopedic issues -Diet: eats a lot of fruits, vegetables, tries to be healthy. Cut out all white sugar. -Cardiac ROS: no shortness of breath, no PND, no orthopnea, no LE edema, no syncope -Family history: no heart issues that he is aware of. Father died age 76, mother around the same age, no heart issues.  Past Medical History:  Diagnosis Date  . Allergy   . Chest wall pain   . Cough   .  Diabetes (HCC)   . Diarrhea recurrent  . GERD (gastroesophageal reflux disease)   . Headache(784.0)   . Irritable bowel syndrome   . Obstructive sleep apnea    apnealink 10/23/10 AHI 5, Sp02 low 87%  . Postural lightheadedness   . SBO (small bowel obstruction) (HCC) 12/2015  . Sleep apnea   . Small bowel obstruction [facility])     Past Surgical History:  Procedure Laterality Date  . CHOLECYSTECTOMY  2009  . COLECTOMY  1994   segmental  . COLONOSCOPY  07/17/2011   hemorrhoids, otherwise normal including terminal ileum and random colon biopises  . GASTROSTOMY  2005   stamm  . LAPAROTOMY  2002, 2005, 2009  . SPHINCTEROTOMY  2009   lateral internal  . UPPER GASTROINTESTINAL ENDOSCOPY  07/17/2011   normal, including duodenal biopsies    Current Medications: Current Outpatient Medications on File Prior to Visit  Medication Sig  . budesonide-formoterol (SYMBICORT) 80-4.5 MCG/ACT inhaler Inhale 2 puffs into the lungs in the morning and at bedtime.  . cetirizine (ZYRTEC) 10 MG tablet Take 10 mg by mouth daily as needed for allergies.  Marland Kitchen gabapentin (NEURONTIN) 300 MG capsule Take 300 mg by mouth daily.  . Lactobacillus (PROBIOTIC ACIDOPHILUS) CAPS Taking daily  . meloxicam (MOBIC) 15 MG tablet Take 15 mg by mouth daily.  . pantoprazole (PROTONIX) 40 MG tablet Take 1 tablet (40 mg total) by mouth 2 (two) times daily before a meal.  . sitaGLIPtin (JANUVIA) 100 MG  tablet Take 1 tablet (100 mg total) by mouth daily.  . traMADol (ULTRAM) 50 MG tablet tramadol 50 mg tablet  Take 1 tablet 3 times a day by oral route as needed.   No current facility-administered medications on file prior to visit.     Allergies:   Metformin and related   Social History   Tobacco Use  . Smoking status: Never Smoker  . Smokeless tobacco: Never Used  Vaping Use  . Vaping Use: Never used  Substance Use Topics  . Alcohol use: No  . Drug use: No    Family History: family history includes Hypertension in  his mother; Liver disease in his father; Pancreatic cancer in his sister. There is no history of Colon cancer, Esophageal cancer, Rectal cancer, or Stomach cancer.  ROS:   Please see the history of present illness.  Additional pertinent ROS: Constitutional: Negative for chills, fever, night sweats, unintentional weight loss  HENT: Negative for ear pain and hearing loss.   Eyes: Negative for loss of vision and eye pain.  Respiratory: Negative for cough, sputum, wheezing.   Cardiovascular: See HPI. Gastrointestinal: Negative for abdominal pain, melena, and hematochezia.  Genitourinary: Negative for dysuria and hematuria.  Musculoskeletal: Negative for falls and myalgias.  Skin: Negative for itching and rash.  Neurological: Negative for focal weakness, focal sensory changes and loss of consciousness.  Endo/Heme/Allergies: Does not bruise/bleed easily.     EKGs/Labs/Other Studies Reviewed:    The following studies were reviewed today: Echo 2012: - Left ventricle: The cavity size was normal. Wall thickness was  increased in a pattern of mild LVH. Systolic function was normal.  The estimated ejection fraction was in the range of 55% to 60%.  Indeterminant diastolic function. Wall motion was normal; there  were no regional wall motion abnormalities.  - Aortic valve: There was no stenosis. Trivial regurgitation.  - Aorta: Mild aortic root dilation. Aortic root dimension: 48mm  (ED).  - Mitral valve: Trivial regurgitation.  - Left atrium: The atrium was at the upper limits of normal in size.  - Right ventricle: The cavity size was normal. Systolic function was  normal.  - Pulmonary arteries: PA peak pressure: 55mm Hg (S).  - Inferior vena cava: The vessel was normal in size; the  respirophasic diameter changes were in the normal range (= 50%);  findings are consistent with normal central venous pressure.  Impressions:   - Normal LV size with mild LV hypertrophy. EF  55-60%. Indeterminant  diastolic function. Normal RV size and systolic function. No  significant valvular dysfunction.   ETT 03/11/2011 9.5 METs, 7:37, no symptoms, achieved 98% MPHR. Baseline RBBB, no ST changes, no evidence of ischemia.   EKG:  EKG is personally reviewed.  The ekg ordered today demonstrates sinus bradycardia at 50 bpm, RBBB  Recent Labs: 10/15/2020: ALT 28; BUN 11; Creatinine, Ser 1.04; Potassium 4.7; Sodium 136 01/08/2021: Hemoglobin 14.8; Platelets 232.0  Recent Lipid Panel    Component Value Date/Time   CHOL 113 10/15/2020 1159   TRIG 93.0 10/15/2020 1159   HDL 32.90 (L) 10/15/2020 1159   CHOLHDL 3 10/15/2020 1159   VLDL 18.6 10/15/2020 1159   LDLCALC 61 10/15/2020 1159   LDLDIRECT 64.0 09/30/2016 0901    Physical Exam:    VS:  BP 120/70   Pulse (!) 50   Ht 6\' 1"  (1.854 m)   Wt 210 lb 3.2 oz (95.3 kg)   SpO2 97%   BMI 27.73 kg/m  Wt Readings from Last 3 Encounters:  01/22/21 210 lb 3.2 oz (95.3 kg)  01/08/21 213 lb (96.6 kg)  10/15/20 216 lb (98 kg)    GEN: Well nourished, well developed in no acute distress HEENT: Normal, moist mucous membranes NECK: No JVD CARDIAC: regular rhythm, normal S1 and S2, no rubs or gallops. No murmur. Mild left chest wall tenderness, but this is not exactly like the pain he gets. VASCULAR: Radial and DP pulses 2+ bilaterally. No carotid bruits RESPIRATORY:  Clear to auscultation without rales, wheezing or rhonchi  ABDOMEN: Soft, non-tender, non-distended MUSCULOSKELETAL:  Ambulates independently SKIN: Warm and dry, no edema NEUROLOGIC:  Alert and oriented x 3. No focal neuro deficits noted. PSYCHIATRIC:  Normal affect    ASSESSMENT:    1. Chest pain, unspecified type   2. Post-COVID chronic dyspnea   3. Type 2 diabetes mellitus without complication, without long-term current use of insulin (HCC)   4. Cardiac risk counseling   5. Counseling on health promotion and disease prevention   6. RBBB    PLAN:     Chest pain: We spent significant time today reviewing different parts of the cardiovascular system (electrical, vascular, functional, and valvular). We discussed how each of these systems can present with different symptoms. We reviewed that there are different ways we evaluate these symptoms with tests. We reviewed which tests I think are most appropriate given the symptoms, and we discussed risks/benefits and limitations of each of these tests. Please see summary below. We also discussed that if testing is unrevealing for a cardiac cause of the symptoms, there are many noncardiac causes as well that can contribute to symptoms. If the heart is ruled out, then I recommend returning to PCP to discuss alternative diagnoses. -discussed treadmill stress (would also need images given diabetes), nuclear stress/lexiscan, and CT coronary angiography. Discussed pros and cons of each, including but not limited to false positive/false negative risk, radiation risk, and risk of IV contrast dye.  -after discussion, he prefers CT coronary, but he is very concerned about cost as he has a high deductible plan -He does have risk factors for CAD, but his chest pain overall is atypical. I do think a CT in the future would give us not only information about current ischemia but also future risk. I respect his concerns about cost. He is working to get additional health coverage. If he is successful, he will contact me to order the study. -we will follow up for improvement -counseled on [name] flag warning signs that need immediate medical attention  Post-Covid shortness of breath: -being followed by pulmonology  Type II diabetes, not on insulin: -last A1c 6.6 -on sitagliptin -cholesterol is excellent on no medications. Las LDL 61. We briefly discussed guideline recommendations for aspirin and statin. CT information would be helpful for further guidance on this as well.  RBBB: -unchanged.  Cardiac risk counseling and  prevention recommendations: -recommend heart healthy/Mediterranean diet, with whole grains, fruits, vegetable, fish, lean meats, nuts, and olive oil. Limit salt. -recommend moderate walking, 3-5 times/week for 30-50 minutes each session. Aim for at least 150 minutes.week. Goal should be pace of 3 miles/hours, or walking 1.5 miles in 30 minutes -recommend avoidance of tobacco products. Avoid excess alcohol. -ASCVD risk score: The ASCVD Risk score Denman George(Goff DC Jr., et al., 2013) failed to calculate for the following reasons:   The valid total cholesterol range is 130 to 320 mg/dL    Plan for follow up: 3 mos or sooner as  needed  Steven Red, MD, PhD, [facility]  [facility]    Medication Adjustments/Labs and Tests Ordered: Current medicines are reviewed at length with the patient today.  Concerns regarding medicines are outlined above.  Orders Placed This Encounter  Procedures  . EKG 12-Lead   No orders of the defined types were placed in this encounter.   Patient Instructions  Medication Instructions:  Your Physician recommend you continue on your current medication as directed.    *If you need a refill on your cardiac medications before your next appointment, please call your pharmacy*   Lab Work: None   Testing/Procedures: None   Follow-Up: At [facility], you and your health needs are our priority.  As part of our continuing mission to provide you with exceptional heart care, we have created designated Provider Care Teams.  These Care Teams include your primary Cardiologist (physician) and Advanced Practice Providers (APPs -  Physician Assistants and Nurse Practitioners) who all work together to provide you with the care you need, when you need it.  We recommend signing up for the patient portal called "MyChart".  Sign up information is provided on this After Visit Summary.  MyChart is used to connect with patients for Virtual Visits (Telemedicine).   Patients are able to view lab/test results, encounter notes, upcoming appointments, etc.  Non-urgent messages can be sent to your provider as well.   To learn more about what you can do with MyChart, go to [email or web address].    Your next appointment:   3 month(s)  The format for your next appointment:   In Person  Provider:   [name], MD       Signed, Steven Red, MD PhD 01/22/2021 12:44 PM    [facility]

## 2021-01-22 NOTE — Patient Instructions (Signed)

## 2021-01-23 NOTE — Telephone Encounter (Signed)
Error

## 2021-02-04 DIAGNOSIS — M5459 Other low back pain: Secondary | ICD-10-CM | POA: Diagnosis not present

## 2021-02-04 DIAGNOSIS — M5412 Radiculopathy, cervical region: Secondary | ICD-10-CM | POA: Diagnosis not present

## 2021-02-15 DIAGNOSIS — Z20822 Contact with and (suspected) exposure to covid-19: Secondary | ICD-10-CM | POA: Diagnosis not present

## 2021-02-18 ENCOUNTER — Encounter: Payer: Self-pay | Admitting: Pulmonary Disease

## 2021-02-18 ENCOUNTER — Other Ambulatory Visit: Payer: Self-pay

## 2021-02-18 ENCOUNTER — Ambulatory Visit (INDEPENDENT_AMBULATORY_CARE_PROVIDER_SITE_OTHER): Payer: BC Managed Care – PPO | Admitting: Pulmonary Disease

## 2021-02-18 VITALS — BP 112/62 | HR 82 | Temp 98.2°F | Ht 73.0 in | Wt 211.0 lb

## 2021-02-18 DIAGNOSIS — R0600 Dyspnea, unspecified: Secondary | ICD-10-CM

## 2021-02-18 DIAGNOSIS — U099 Post covid-19 condition, unspecified: Secondary | ICD-10-CM

## 2021-02-18 DIAGNOSIS — B948 Sequelae of other specified infectious and parasitic diseases: Secondary | ICD-10-CM | POA: Diagnosis not present

## 2021-02-18 DIAGNOSIS — R053 Chronic cough: Secondary | ICD-10-CM | POA: Diagnosis not present

## 2021-02-18 LAB — PULMONARY FUNCTION TEST
DL/VA % pred: 103 %
DL/VA: 4.46 ml/min/mmHg/L
DLCO cor % pred: 87 %
DLCO cor: 27.71 ml/min/mmHg
DLCO unc % pred: 88 %
DLCO unc: 27.86 ml/min/mmHg
FEF 25-75 Post: 2.31 L/sec
FEF 25-75 Pre: 4.59 L/sec
FEF2575-%Change-Post: -49 %
FEF2575-%Pred-Post: 63 %
FEF2575-%Pred-Pre: 127 %
FEV1-%Change-Post: -17 %
FEV1-%Pred-Post: 75 %
FEV1-%Pred-Pre: 92 %
FEV1-Post: 3.21 L
FEV1-Pre: 3.91 L
FEV1FVC-%Change-Post: -10 %
FEV1FVC-%Pred-Pre: 110 %
FEV6-%Change-Post: -7 %
FEV6-%Pred-Post: 79 %
FEV6-%Pred-Pre: 86 %
FEV6-Post: 4.2 L
FEV6-Pre: 4.56 L
FEV6FVC-%Change-Post: 0 %
FEV6FVC-%Pred-Post: 103 %
FEV6FVC-%Pred-Pre: 103 %
FVC-%Change-Post: -8 %
FVC-%Pred-Post: 76 %
FVC-%Pred-Pre: 83 %
FVC-Post: 4.21 L
FVC-Pre: 4.59 L
Post FEV1/FVC ratio: 76 %
Post FEV6/FVC ratio: 100 %
Pre FEV1/FVC ratio: 85 %
Pre FEV6/FVC Ratio: 99 %
RV % pred: 92 %
RV: 2.1 L
TLC % pred: 93 %
TLC: 7.07 L

## 2021-02-18 NOTE — Progress Notes (Signed)
[facility]  Chief Complaint  Patient presents with  . Follow-up    Follow up after PFT-not sleeping well at all, has intermittent left chest    Constitutional:  BP 112/62 (BP Location: Right Arm, Cuff Size: Normal)   Pulse 82   Temp 98.2 F (36.8 C) (Temporal)   Ht 6\' 1"  (1.854 m)   Wt 211 lb (95.7 kg)   SpO2 97% Comment: Room air  BMI 27.84 kg/m   Past Medical History:  Allergies, GERD, IBS, Vit D deficiency, COVID 25 November 2020  Past Surgical History:  He  has a past surgical history that includes Colectomy (1994); laparotomy (2002, 2005, 2009); Gastrostomy (2005); Cholecystectomy (2009); Sphincterotomy (2009); Colonoscopy (07/17/2011); and Upper gastrointestinal endoscopy (07/17/2011).  Brief Summary:  [name] is a 55 y.o. male with cough after COVID 19 infection in December 2021.      Subjective:   I last saw him in 2019 for sleep apnea.  He wasn't able to tolerate CPAP then.  He got COVID infection in December 2021.  He developed cough after this.  Chest xray from February was normal.  PFT today normal.  Was using symbicort.  This helped some, but he is worried about how much this costs.  He isn't coughing as much as before.  Not having fever, wheeze, chest tightness, or sputum.  Still gets intermittent discomfort in left chest.  Has appointment with cardiology.  Physical Exam:   Appearance - well kempt, wearing neck brace  ENMT - no sinus tenderness, no oral exudate, no LAN, Mallampati 2 airway, no stridor  Respiratory - equal breath sounds bilaterally, no wheezing or rales  CV - s1s2 regular rate and rhythm, no murmurs  Ext - no clubbing, no edema  Skin - no rashes  Psych - normal mood and affect   Pulmonary testing:   PFT 02/18/21 >> FEV1 3.91 (92%), FEV1% 81, TLC 7.07 (93%), DLCO 885  Chest Imaging:    Sleep Tests:   HST 06/29/18 >> AHI 18.1, SaO2 low 87%.  Cardiac Tests:    Social History:   He  reports that he has never smoked. He has never used smokeless tobacco. He reports that he does not drink alcohol and does not use drugs.  Family History:  His family history includes Hypertension in his mother; Liver disease in his father; Pancreatic cancer in his sister.     Assessment/Plan:   Post COVID cough. - seems to be improving - chest xray and pulmonary function test normal - will have him stop symbicort and monitor his symptoms  Atypical chest pain. - he will follow up with cardiology; if this assessment is unrevealing, then might need GI assessment  Time Spent Involved in Patient Care on Day of Examination:  22 minutes  Follow up:  Patient Instructions  Can try stopping symbicort  Follow up in 3 months   Medication List:   Allergies as of 02/18/2021      Reactions   Metformin And Related    diarrhea      Medication List       Accurate as of February 18, 2021  4:33 PM. If you have any questions, ask your nurse or doctor.        STOP taking these medications   budesonide-formoterol 80-4.5 MCG/ACT inhaler Commonly known as: SYMBICORT Stopped by: February 20, 2021, MD   gabapentin 300 MG capsule Commonly known as: NEURONTIN Stopped by: Coralyn Helling, MD  meloxicam 15 MG tablet Commonly known as: MOBIC Stopped by: Coralyn Helling, MD     TAKE these medications   cetirizine 10 MG tablet Commonly known as: ZYRTEC Take 10 mg by mouth daily as needed for allergies.   pantoprazole 40 MG tablet Commonly known as: PROTONIX Take 1 tablet (40 mg total) by mouth 2 (two) times daily before a meal.   Probiotic Acidophilus Caps Taking daily   sitaGLIPtin 100 MG tablet Commonly known as: Januvia Take 1 tablet (100 mg total) by mouth daily.   traMADol 50 MG tablet Commonly known as: ULTRAM tramadol 50 mg tablet  Take 1 tablet 3 times a day by oral route as needed.       Signature:  Coralyn Helling, MD [facility] Pulmonary/Critical Care Pager - [phone] 02/18/2021, 4:33 PM

## 2021-02-18 NOTE — Patient Instructions (Signed)
Can try stopping symbicort  Follow up in 3 months

## 2021-02-18 NOTE — Progress Notes (Signed)
Full PFT completed today ? ?

## 2021-03-15 ENCOUNTER — Telehealth: Payer: Self-pay | Admitting: Cardiology

## 2021-03-15 DIAGNOSIS — Z01812 Encounter for preprocedural laboratory examination: Secondary | ICD-10-CM

## 2021-03-15 DIAGNOSIS — R072 Precordial pain: Secondary | ICD-10-CM

## 2021-03-15 NOTE — Telephone Encounter (Signed)
Spoke with the patient. He stated that he was ready to move forward with whatever test that Dr. Cristal Deer feels is necessary.

## 2021-03-15 NOTE — Telephone Encounter (Signed)
Patient called to say that he is now ready to take the test that was discuss on his last visit. Please advise

## 2021-03-22 NOTE — Telephone Encounter (Signed)
Spoke to [name]. He state he is ready to proceed with Cardiac CTA as recommended by MD at last OV. Orders placed and instructions provided to [name]. [name] also advise to have BMP done 1 week prior to test. [name] verbalized understanding.   Your cardiac CT will be scheduled at one of the below locations:   [facility] [address] [place], [place] [address] [phone]   If scheduled at [facility], please arrive at the [facility] main entrance (entrance A) of [facility] 30 minutes prior to test start time. Proceed to the [facility] Radiology Department (first floor) to check-in and test prep.  If scheduled at [facility], please arrive 15 mins early for check-in and test prep.  Please follow these instructions carefully (unless otherwise directed):  Hold all erectile dysfunction medications at least 3 days (72 hrs) prior to test.  On the Night Before the Test: . Be sure to Drink plenty of water. . Do not consume any caffeinated/decaffeinated beverages or chocolate 12 hours prior to your test. . Do not take any antihistamines 12 hours prior to your test.   On the Day of the Test: . Drink plenty of water until 1 hour prior to the test. . Do not eat any food 4 hours prior to the test. . You may take your regular medications prior to the test.        After the Test: . Drink plenty of water. . After receiving IV contrast, you may experience a mild flushed feeling. This is normal. . On occasion, you may experience a mild rash up to 24 hours after the test. This is not dangerous. If this occurs, you can take Benadryl 25 mg and increase your fluid intake. . If you experience trouble breathing, this can be serious. If it is severe call 911 IMMEDIATELY. If it is mild, please call our office. . If you take any of these medications: Glipizide/Metformin, Avandament, Glucavance, please do not take 48 hours after completing test unless otherwise  instructed.   Once we have confirmed authorization from your insurance company, we will call you to set up a date and time for your test. Based on how quickly your insurance processes prior authorizations requests, please allow up to 4 weeks to be contacted for scheduling your Cardiac CT appointment. Be advised that routine Cardiac CT appointments could be scheduled as many as 8 weeks after your provider has ordered it.  For non-scheduling related questions, please contact the cardiac imaging nurse navigator should you have any questions/concerns: Rockwell Alexandria, Cardiac Imaging Nurse Navigator Larey Brick, Cardiac Imaging Nurse Navigator [facility] Direct Office Dial: [phone]   For scheduling needs, including cancellations and rescheduling, please call [place], [phone].

## 2021-04-01 DIAGNOSIS — Z01812 Encounter for preprocedural laboratory examination: Secondary | ICD-10-CM | POA: Diagnosis not present

## 2021-04-01 DIAGNOSIS — G894 Chronic pain syndrome: Secondary | ICD-10-CM | POA: Diagnosis not present

## 2021-04-01 DIAGNOSIS — E539 Vitamin B deficiency, unspecified: Secondary | ICD-10-CM | POA: Diagnosis not present

## 2021-04-01 DIAGNOSIS — M488X9 Other specified spondylopathies, site unspecified: Secondary | ICD-10-CM | POA: Diagnosis not present

## 2021-04-01 DIAGNOSIS — E559 Vitamin D deficiency, unspecified: Secondary | ICD-10-CM | POA: Diagnosis not present

## 2021-04-02 ENCOUNTER — Telehealth: Payer: Self-pay

## 2021-04-02 ENCOUNTER — Telehealth: Payer: Self-pay | Admitting: Pulmonary Disease

## 2021-04-02 ENCOUNTER — Telehealth: Payer: Self-pay | Admitting: Cardiology

## 2021-04-02 LAB — BASIC METABOLIC PANEL
BUN/Creatinine Ratio: 7 — ABNORMAL LOW (ref 9–20)
BUN: 8 mg/dL (ref 6–24)
CO2: 24 mmol/L (ref 20–29)
Calcium: 9.9 mg/dL (ref 8.7–10.2)
Chloride: 101 mmol/L (ref 96–106)
Creatinine, Ser: 1.07 mg/dL (ref 0.76–1.27)
Glucose: 117 mg/dL — ABNORMAL HIGH (ref 65–99)
Potassium: 4.4 mmol/L (ref 3.5–5.2)
Sodium: 140 mmol/L (ref 134–144)
eGFR: 83 mL/min/{1.73_m2} (ref >59)

## 2021-04-02 NOTE — Telephone Encounter (Signed)
Follow Up:    Pt would like his lab results from yesterday to be explained to him, said he did not understand it.

## 2021-04-02 NOTE — Telephone Encounter (Signed)
I have called and spoke with pt and he stated that when he was here to see VS on 03/14 he had the PFT done and he had labs done.  He said that these labs are showing up in his mychart but he does not understand what they mean.  I do not see any labs that were drawn on 03/14.  Irma can you see if you have any labs in [facility] for this pt?  Thanks

## 2021-04-02 NOTE — Telephone Encounter (Signed)
Spoke to patient advised bmet results not available.We will call back when Dr.Christopher reviews.

## 2021-04-03 DIAGNOSIS — M064 Inflammatory polyarthropathy: Secondary | ICD-10-CM | POA: Diagnosis not present

## 2021-04-03 DIAGNOSIS — E559 Vitamin D deficiency, unspecified: Secondary | ICD-10-CM | POA: Diagnosis not present

## 2021-04-03 DIAGNOSIS — M488X9 Other specified spondylopathies, site unspecified: Secondary | ICD-10-CM | POA: Diagnosis not present

## 2021-04-03 DIAGNOSIS — E539 Vitamin B deficiency, unspecified: Secondary | ICD-10-CM | POA: Diagnosis not present

## 2021-04-03 NOTE — Telephone Encounter (Signed)
Called and spoke with patient to let him know that we do not have results of lab work from 3/14. Informed him that we have labs from 2/1 and that he had labs on Monday from another provider but nothing from [place] in March. Patient expressed understanding. Nothing further needed at this time.

## 2021-04-03 NOTE — Telephone Encounter (Signed)
I have no documentation of labs on that day not sure what he is seeing.

## 2021-04-03 NOTE — Telephone Encounter (Signed)
See updated phone encounter.

## 2021-04-05 ENCOUNTER — Telehealth: Payer: Self-pay | Admitting: Internal Medicine

## 2021-04-05 NOTE — Telephone Encounter (Addendum)
[facility] FYI:  --Caller states he is experiencing severe abdominal pain only in that area. Started yesterday. Intermittent pain LLQ   Advised to go to ED now, patient understood.

## 2021-04-05 NOTE — Telephone Encounter (Signed)
Patient called and said that he is having right lower abdominal pain. He said that he thinks its his appendix. Transferred to [facility]

## 2021-04-08 NOTE — Telephone Encounter (Signed)
Message left for patient. He must be scheduled to be seen and evaluated.   If he calls back please schedule appointment.

## 2021-04-08 NOTE — Telephone Encounter (Signed)
Patient called and said that he did not go to the ER. He said that the pain comes and goes and is requesting scans and blood work to rule out an infection. He can be reached at [phone]. Please advise

## 2021-04-14 NOTE — Patient Instructions (Addendum)
Blood work was ordered.     Medications changes include :   Start B12 liquid daily - 1000 mcg daily   Your prescription(s) have been submitted to your pharmacy. Please take as directed and contact our office if you believe you are having problem(s) with the medication(s).    Please followup in 6 months    Vitamin B12 Deficiency Vitamin B12 deficiency occurs when the body does not have enough vitamin B12, which is an important vitamin. The body needs this vitamin:  To make red blood cells.  To make DNA. This is the genetic material inside cells.  To help the nerves work properly so they can carry messages from the brain to the body. Vitamin B12 deficiency can cause various health problems, such as a low red blood cell count (anemia) or nerve damage. What are the causes? This condition may be caused by:  Not eating enough foods that contain vitamin B12.  Not having enough stomach acid and digestive fluids to properly absorb vitamin B12 from the food that you eat.  Certain digestive system diseases that make it hard to absorb vitamin B12. These diseases include Crohn's disease, chronic pancreatitis, and cystic fibrosis.  A condition in which the body does not make enough of a protein (intrinsic factor), resulting in too few red blood cells (pernicious anemia).  Having a surgery in which part of the stomach or small intestine is removed.  Taking certain medicines that make it hard for the body to absorb vitamin B12. These medicines include: ? Heartburn medicines (antacids and proton pump inhibitors). ? Certain antibiotic medicines. ? Some medicines that are used to treat diabetes, tuberculosis, gout, or high cholesterol. What increases the risk? The following factors may make you more likely to develop a B12 deficiency:  Being older than age 84.  Eating a vegetarian or vegan diet, especially while you are pregnant.  Eating a poor diet while you are pregnant.  Taking  certain medicines.  Having alcoholism. What are the signs or symptoms? In some cases, there are no symptoms of this condition. If the condition leads to anemia or nerve damage, various symptoms can occur, such as:  Weakness.  Fatigue.  Loss of appetite.  Weight loss.  Numbness or tingling in your hands and feet.  Redness and burning of the tongue.  Confusion or memory problems.  Depression.  Sensory problems, such as color blindness, ringing in the ears, or loss of taste.  Diarrhea or constipation.  Trouble walking. If anemia is severe, symptoms can include:  Shortness of breath.  Dizziness.  Rapid heart rate (tachycardia). How is this diagnosed? This condition may be diagnosed with a blood test to measure the level of vitamin B12 in your blood. You may also have other tests, including:  A group of tests that measure certain characteristics of blood cells (complete blood count, CBC).  A blood test to measure intrinsic factor.  A procedure where a thin tube with a camera on the end is used to look into your stomach or intestines (endoscopy). Other tests may be needed to discover the cause of B12 deficiency. How is this treated? Treatment for this condition depends on the cause. This condition may be treated by:  Changing your eating and drinking habits, such as: ? Eating more foods that contain vitamin B12. ? Drinking less alcohol or no alcohol.  Getting vitamin B12 injections.  Taking vitamin B12 supplements. Your health care provider will tell you which dosage is best for you. Follow  these instructions at home: Eating and drinking  Eat lots of healthy foods that contain vitamin B12, including: ? Meats and poultry. This includes beef, pork, chicken, [place], and organ meats, such as liver. ? Seafood. This includes clams, rainbow trout, salmon, tuna, and haddock. ? Eggs. ? Cereal and dairy products that are fortified. This means that vitamin B12 has been  added to the food. Check the label on the package to see if the food is fortified. The items listed above may not be a complete list of recommended foods and beverages. Contact a dietitian for more information.   General instructions  Get any injections that are prescribed by your health care provider.  Take supplements only as told by your health care provider. Follow the directions carefully.  Do not drink alcohol if your health care provider tells you not to. In some cases, you may only be asked to limit alcohol use.  Keep all follow-up visits as told by your health care provider. This is important. Contact a health care provider if:  Your symptoms come back. Get help right away if you:  Develop shortness of breath.  Have a rapid heart rate.  Have chest pain.  Become dizzy or lose consciousness. Summary  Vitamin B12 deficiency occurs when the body does not have enough vitamin B12.  The main causes of vitamin B12 deficiency include dietary deficiency, digestive diseases, pernicious anemia, and having a surgery in which part of the stomach or small intestine is removed.  In some cases, there are no symptoms of this condition. If the condition leads to anemia or nerve damage, various symptoms can occur, such as weakness, shortness of breath, and numbness.  Treatment may include getting vitamin B12 injections or taking vitamin B12 supplements. Eat lots of healthy foods that contain vitamin B12. This information is not intended to replace advice given to you by your health care provider. Make sure you discuss any questions you have with your health care provider. Document Revised: 05/13/2019 Document Reviewed: 08/03/2018 Elsevier Patient Education  2021 [employer].

## 2021-04-14 NOTE — Progress Notes (Signed)
Subjective:    Patient ID: [name], male    DOB: 07-15-1966, 55 y.o.   MRN: [id number]  HPI The patient is here for follow up of their chronic medical problems, including DM, GERD.  He is following with ortho for chronic neck and back pain.  He is also seeing pulmonary for SOB since covid and cardiology for Left sided chest pain.   He has not worked for 6-7 months due to his chronic neck pain.  He is in bed 18-20 hrs a day.  He is most likely going to need to apply for long-term disability.  He has had intermittent RLQ pain.  He did call us a couple of weeks ago regarding this and we advised that he go to the emergency room because he did not think he had appendicitis.  He did not go have this evaluated.  He states he has had intermittent, but denies any pain now.   Medications and allergies reviewed with patient and updated if appropriate.  Patient Active Problem List   Diagnosis Date Noted  . B12 deficiency 04/15/2021  . Post-COVID chronic dyspnea 01/10/2021  . Generalized abdominal pain 02/28/2020  . Abdominal distention 02/28/2020  . Chronic constipation 10/10/2019  . Acute left-sided low back pain 04/15/2019  . Chronic insomnia 03/24/2019  . Abdominal wall hernia 10/06/2018  . Arthralgia 06/01/2017  . Other chest pain 06/01/2017  . Fatigue 06/01/2017  . Diabetes (HCC) 10/01/2016  . Meralgia paresthetica of right side 01/25/2016  . Vitamin D deficiency 01/25/2016  . SBO (small bowel obstruction) (HCC) 01/02/2016  . Allergic rhinitis 02/07/2011  . OBSTRUCTIVE SLEEP APNEA 10/23/2010  . GERD 03/12/2010  . Irritable bowel syndrome 03/12/2010  . DIARRHEA, RECURRENT 02/05/2010    Current Outpatient Medications on File Prior to Visit  Medication Sig Dispense Refill  . cetirizine (ZYRTEC) 10 MG tablet Take 10 mg by mouth daily as needed for allergies.    Marland Kitchen gabapentin (NEURONTIN) 300 MG capsule Take 1-2 capsules by mouth at bedtime.    . Lactobacillus (PROBIOTIC  ACIDOPHILUS) CAPS Taking daily    . pantoprazole (PROTONIX) 40 MG tablet Take 1 tablet (40 mg total) by mouth 2 (two) times daily before a meal. 90 tablet 1  . sitaGLIPtin (JANUVIA) 100 MG tablet Take 1 tablet (100 mg total) by mouth daily. 90 tablet 1  . traMADol (ULTRAM) 50 MG tablet tramadol 50 mg tablet  Take 1 tablet 3 times a day by oral route as needed.     No current facility-administered medications on file prior to visit.    Past Medical History:  Diagnosis Date  . Allergy   . Chest wall pain   . Cough   . Diabetes (HCC)   . Diarrhea recurrent  . GERD (gastroesophageal reflux disease)   . Headache(784.0)   . Irritable bowel syndrome   . Obstructive sleep apnea    apnealink 10/23/10 AHI 5, Sp02 low 87%  . Postural lightheadedness   . SBO (small bowel obstruction) (HCC) 12/2015  . Sleep apnea   . Small bowel obstruction [facility])     Past Surgical History:  Procedure Laterality Date  . CHOLECYSTECTOMY  2009  . COLECTOMY  1994   segmental  . COLONOSCOPY  07/17/2011   hemorrhoids, otherwise normal including terminal ileum and random colon biopises  . GASTROSTOMY  2005   stamm  . LAPAROTOMY  2002, 2005, 2009  . SPHINCTEROTOMY  2009   lateral internal  . UPPER GASTROINTESTINAL ENDOSCOPY  07/17/2011   normal, including duodenal biopsies    Social History   Socioeconomic History  . Marital status: Married    Spouse name: Not on file  . Number of children: Not on file  . Years of education: Not on file  . Highest education level: Not on file  Occupational History  . Occupation: FORK LIFT DRIVER    Employer: [employer]  Tobacco Use  . Smoking status: Never Smoker  . Smokeless tobacco: Never Used  Vaping Use  . Vaping Use: Never used  Substance and Sexual Activity  . Alcohol use: No  . Drug use: No  . Sexual activity: Not on file  Other Topics Concern  . Not on file  Social History Narrative   Occupation: Group Engineer, petroleum (deliveries)    Patient has never smoked.   Alcohol Use-no   Illicit Drug Use-no   Married-Divorced-Married again 2009-    3 children 2 sons and 1 daughter   [place] heritage   No regular exercise   Social Determinants of Health   Financial Resource Strain: Not on file  Food Insecurity: Not on file  Transportation Needs: Not on file  Physical Activity: Not on file  Stress: Not on file  Social Connections: Not on file    Family History  Problem Relation Age of Onset  . Liver disease Father        liver cancer???  . Hypertension Mother   . Pancreatic cancer Sister   . Colon cancer Neg Hx   . Esophageal cancer Neg Hx   . Rectal cancer Neg Hx   . Stomach cancer Neg Hx     Review of Systems  Constitutional: Positive for fatigue. Negative for fever.  Respiratory: Negative for cough, shortness of breath and wheezing.   Cardiovascular: Positive for chest pain (occ left sided chest pain - has seen cardio). Negative for palpitations and leg swelling.  Gastrointestinal: Positive for abdominal pain (intermittent RLQ, no pain now) and constipation (takes miralax daily).       Occ gerd  Musculoskeletal: Positive for back pain and neck pain.  Neurological: Positive for headaches (occ). Negative for light-headedness.       Objective:   Vitals:   04/15/21 1401  BP: 108/78  Pulse: 77  Temp: 98.2 F (36.8 C)  SpO2: 98%   BP Readings from Last 3 Encounters:  04/15/21 108/78  02/18/21 112/62  01/22/21 120/70   Wt Readings from Last 3 Encounters:  04/15/21 202 lb (91.6 kg)  02/18/21 211 lb (95.7 kg)  01/22/21 210 lb 3.2 oz (95.3 kg)   Body mass index is 26.65 kg/m.   Physical Exam    Constitutional: Appears well-developed and well-nourished. No distress.  HENT:  Head: Normocephalic and atraumatic.  Neck: Neck supple. No tracheal deviation present. No thyromegaly present.  No cervical lymphadenopathy Cardiovascular: Normal rate, regular rhythm and normal heart sounds.   No  murmur heard. No carotid bruit .  No edema Pulmonary/Chest: Effort normal and breath sounds normal. No respiratory distress. No has no wheezes. No rales.  Skin: Skin is warm and dry. Not diaphoretic.  Psychiatric: Normal mood and affect. Behavior is normal.      Assessment & Plan:    See Problem List for Assessment and Plan of chronic medical problems.    This visit occurred during the SARS-CoV-2 public health emergency.  Safety protocols were in place, including screening questions prior to the visit, additional usage of staff PPE, and extensive cleaning of  exam room while observing appropriate contact time as indicated for disinfecting solutions.

## 2021-04-15 ENCOUNTER — Encounter: Payer: Self-pay | Admitting: Internal Medicine

## 2021-04-15 ENCOUNTER — Ambulatory Visit (INDEPENDENT_AMBULATORY_CARE_PROVIDER_SITE_OTHER): Payer: BC Managed Care – PPO | Admitting: Internal Medicine

## 2021-04-15 ENCOUNTER — Other Ambulatory Visit: Payer: Self-pay

## 2021-04-15 VITALS — BP 108/78 | HR 77 | Temp 98.2°F | Ht 73.0 in | Wt 202.0 lb

## 2021-04-15 DIAGNOSIS — E559 Vitamin D deficiency, unspecified: Secondary | ICD-10-CM

## 2021-04-15 DIAGNOSIS — E119 Type 2 diabetes mellitus without complications: Secondary | ICD-10-CM | POA: Diagnosis not present

## 2021-04-15 DIAGNOSIS — K219 Gastro-esophageal reflux disease without esophagitis: Secondary | ICD-10-CM

## 2021-04-15 DIAGNOSIS — E538 Deficiency of other specified B group vitamins: Secondary | ICD-10-CM

## 2021-04-15 LAB — LIPID PANEL
Cholesterol: 121 mg/dL (ref 0–200)
HDL: 33 mg/dL — ABNORMAL LOW (ref >39.00)
LDL Cholesterol: 65 mg/dL (ref 0–99)
NonHDL: 88.01
Total CHOL/HDL Ratio: 4
Triglycerides: 116 mg/dL (ref 0.0–149.0)
VLDL: 23.2 mg/dL (ref 0.0–40.0)

## 2021-04-15 LAB — COMPREHENSIVE METABOLIC PANEL
ALT: 17 U/L (ref 0–53)
AST: 17 U/L (ref 0–37)
Albumin: 4.5 g/dL (ref 3.5–5.2)
Alkaline Phosphatase: 55 U/L (ref 39–117)
BUN: 10 mg/dL (ref 6–23)
CO2: 28 mEq/L (ref 19–32)
Calcium: 9.9 mg/dL (ref 8.4–10.5)
Chloride: 102 mEq/L (ref 96–112)
Creatinine, Ser: 1.02 mg/dL (ref 0.40–1.50)
GFR: 84 mL/min (ref >60.00)
Glucose, Bld: 109 mg/dL — ABNORMAL HIGH (ref 70–99)
Potassium: 4.2 mEq/L (ref 3.5–5.1)
Sodium: 138 mEq/L (ref 135–145)
Total Bilirubin: 1.2 mg/dL (ref 0.2–1.2)
Total Protein: 7.8 g/dL (ref 6.0–8.3)

## 2021-04-15 LAB — HEMOGLOBIN A1C: Hgb A1c MFr Bld: 6.3 % (ref 4.6–6.5)

## 2021-04-15 NOTE — Assessment & Plan Note (Signed)
Chronic GERD fairly controlled, but he admits that he does experience GERD at times-wondered if he should increase the pantoprazole He is in bed most of the day and does not lay down after eating.  He is also taking NSAIDs for some of his pain Discussed long-term consequences of taking the PPI and ideally we want to avoid increasing this if possible Stressed not laying down w/in three hours of eating, taking NSAIDs with food He will work on some of his habits and try to avoid increasing the pantoprazole-try to continue once a day, but if he does not need it twice a day okay to do for short time.

## 2021-04-15 NOTE — Assessment & Plan Note (Signed)
Chronic Continue januvia 100 mg daily Check a1c, cmp, lipids

## 2021-04-15 NOTE — Assessment & Plan Note (Addendum)
Chronic Taking vitamin D daily Vitamin D level checked recently by orthopedics-he will have the blood work sent to me so I can review Continue vitamin D supplementation

## 2021-04-15 NOTE — Assessment & Plan Note (Addendum)
Acute Recently checked by ortho and it was low He will have blood work from orthopedics sent to me Advised to start 1000 mcg daily-recommended liquid B12 for better absorption Can recheck at his next visit-if not improved she will need monthly injections

## 2021-04-16 ENCOUNTER — Other Ambulatory Visit (HOSPITAL_COMMUNITY): Payer: Self-pay | Admitting: Cardiovascular Disease

## 2021-04-16 ENCOUNTER — Other Ambulatory Visit: Payer: Self-pay | Admitting: Cardiovascular Disease

## 2021-04-16 ENCOUNTER — Telehealth (HOSPITAL_COMMUNITY): Payer: Self-pay | Admitting: Emergency Medicine

## 2021-04-16 NOTE — Telephone Encounter (Signed)
Reaching out to patient to offer assistance regarding upcoming cardiac imaging study; pt verbalizes understanding of appt date/time, parking situation and where to check in, pre-test NPO status and medications ordered, and verified current allergies; name and call back number provided for further questions should they arise Sawyer Mentzer RN Navigator Cardiac Imaging [facility] [phone] office [phone] cell 

## 2021-04-16 NOTE — Telephone Encounter (Signed)
Results reviewed in mychart.  

## 2021-04-18 ENCOUNTER — Ambulatory Visit (HOSPITAL_COMMUNITY)
Admission: RE | Admit: 2021-04-18 | Discharge: 2021-04-18 | Disposition: A | Discharge status: Home / Self Care | Payer: BC Managed Care – PPO | Source: Ambulatory Visit | Attending: Cardiology | Admitting: Cardiology

## 2021-04-18 ENCOUNTER — Other Ambulatory Visit: Payer: Self-pay

## 2021-04-18 DIAGNOSIS — R072 Precordial pain: Secondary | ICD-10-CM | POA: Insufficient documentation

## 2021-04-18 MED ORDER — NITROGLYCERIN 0.4 MG SL SUBL: 0.8000 mg | SUBLINGUAL_TABLET | Freq: Once | SUBLINGUAL | Status: AC

## 2021-04-18 MED ORDER — METOPROLOL TARTRATE 5 MG/5ML IV SOLN: 5.0000 mg | INTRAVENOUS | Status: DC | PRN

## 2021-04-18 MED ORDER — METOPROLOL TARTRATE 5 MG/5ML IV SOLN
INTRAVENOUS | Status: AC
  Administered 2021-04-18: 5 mg via INTRAVENOUS
  Filled 2021-04-18: qty 10

## 2021-04-18 MED ORDER — NITROGLYCERIN 0.4 MG SL SUBL
SUBLINGUAL_TABLET | SUBLINGUAL | Status: AC
  Administered 2021-04-18: 0.8 mg via SUBLINGUAL
  Filled 2021-04-18: qty 2

## 2021-04-18 MED ORDER — IOHEXOL 350 MG/ML SOLN
95.0000 mL | Freq: Once | INTRAVENOUS | Status: AC | PRN
  Administered 2021-04-18: 95 mL via INTRAVENOUS

## 2021-04-22 ENCOUNTER — Ambulatory Visit: Payer: BC Managed Care – PPO | Admitting: Cardiology

## 2021-05-01 DIAGNOSIS — M792 Neuralgia and neuritis, unspecified: Secondary | ICD-10-CM | POA: Diagnosis not present

## 2021-05-01 DIAGNOSIS — M25559 Pain in unspecified hip: Secondary | ICD-10-CM | POA: Diagnosis not present

## 2021-05-09 ENCOUNTER — Other Ambulatory Visit: Payer: Self-pay

## 2021-05-09 ENCOUNTER — Ambulatory Visit (INDEPENDENT_AMBULATORY_CARE_PROVIDER_SITE_OTHER): Payer: BC Managed Care – PPO | Admitting: Cardiology

## 2021-05-09 ENCOUNTER — Encounter: Payer: Self-pay | Admitting: Cardiology

## 2021-05-09 VITALS — BP 98/64 | HR 64 | Ht 73.0 in | Wt 204.0 lb

## 2021-05-09 DIAGNOSIS — Z7189 Other specified counseling: Secondary | ICD-10-CM

## 2021-05-09 DIAGNOSIS — Z79899 Other long term (current) drug therapy: Secondary | ICD-10-CM

## 2021-05-09 DIAGNOSIS — I2510 Atherosclerotic heart disease of native coronary artery without angina pectoris: Secondary | ICD-10-CM

## 2021-05-09 DIAGNOSIS — E119 Type 2 diabetes mellitus without complications: Secondary | ICD-10-CM | POA: Diagnosis not present

## 2021-05-09 MED ORDER — ROSUVASTATIN CALCIUM 5 MG PO TABS: 5.0000 mg | ORAL_TABLET | Freq: Every day | ORAL | 3 refills | Status: DC

## 2021-05-09 NOTE — Progress Notes (Signed)
Cardiology Office Note:    Date:  05/09/2021   ID:  Langston Masker, DOB [birth year], MRN [id number]  PCP:  Binnie Rail, MD  Cardiologist:  Buford Dresser, MD  Referring MD: Binnie Rail, MD   CC: follow up  History of Present Illness:    [name] is a 55 y.o. male with a hx of type II diabetes, OSA who is seen for follow up today. I initially met him 01/22/21 as a new consult at the request of Burns, Claudina Lick, MD for the evaluation and management of chest pain.  Comorbidities: type II diabetes, on Januvia. Has had multiple surgeries for scar tissue removal, has had issues with GERD in the past. Multiple orthopedic issues  Today: Reviewed results of CT cardiac. Overall reassuring that symptoms are not due to the heart. Did discuss there is one very small area of calcium on the RCA, without obstruction. Reviewed recommendations for aspirin, statin, lifestyle today.  Main issue is orthopedic concerns, limiting his activity level and keeping him out of work.   Denies shortness of breath at rest or with normal exertion. No PND, orthopnea, LE edema or unexpected weight gain. No syncope or palpitations.  Past Medical History:  Diagnosis Date  . Allergy   . Chest wall pain   . Cough   . Diabetes ([place])   . Diarrhea recurrent  . GERD (gastroesophageal reflux disease)   . Headache(784.0)   . Irritable bowel syndrome   . Obstructive sleep apnea    apnealink 10/23/10 AHI 5, Sp02 low 87%  . Postural lightheadedness   . SBO (small bowel obstruction) ([place]) 12/2015  . Sleep apnea   . Small bowel obstruction [facility])     Past Surgical History:  Procedure Laterality Date  . CHOLECYSTECTOMY  2009  . COLECTOMY  1994   segmental  . COLONOSCOPY  07/17/2011   hemorrhoids, otherwise normal including terminal ileum and random colon biopises  . GASTROSTOMY  2005   stamm  . LAPAROTOMY  2002, 2005, 2009  . SPHINCTEROTOMY  2009   lateral internal  . UPPER GASTROINTESTINAL ENDOSCOPY   07/17/2011   normal, including duodenal biopsies    Current Medications: Current Outpatient Medications on File Prior to Visit  Medication Sig  . cetirizine (ZYRTEC) 10 MG tablet Take 10 mg by mouth daily as needed for allergies.  Marland Kitchen gabapentin (NEURONTIN) 300 MG capsule Take 1-2 capsules by mouth at bedtime.  . Lactobacillus (PROBIOTIC ACIDOPHILUS) CAPS Taking daily  . pantoprazole (PROTONIX) 40 MG tablet Take 1 tablet (40 mg total) by mouth 2 (two) times daily before a meal.  . sitaGLIPtin (JANUVIA) 100 MG tablet Take 1 tablet (100 mg total) by mouth daily.  . traMADol (ULTRAM) 50 MG tablet tramadol 50 mg tablet  Take 1 tablet 3 times a day by oral route as needed.   No current facility-administered medications on file prior to visit.     Allergies:   Metformin and related   Social History   Tobacco Use  . Smoking status: Never Smoker  . Smokeless tobacco: Never Used  Vaping Use  . Vaping Use: Never used  Substance Use Topics  . Alcohol use: No  . Drug use: No    Family History: family history includes Hypertension in his mother; Liver disease in his father; Pancreatic cancer in his sister. There is no history of Colon cancer, Esophageal cancer, Rectal cancer, or Stomach cancer.  ROS:   Please see the history of present illness.  Additional pertinent ROS otherwise unremarkable.   EKGs/Labs/Other Studies Reviewed:    The following studies were reviewed today: CT cardiac 04/18/21 IMPRESSION: 1. Coronary calcium score of 6.48. This was 73rd percentile for age-, race-, and sex-matched controls.  2. Normal coronary origin with right dominance.  3. Minimal calcified plaque in the mid RCA.  CAD-RADS 1.  Echo 2012: - Left ventricle: The cavity size was normal. Wall thickness was  increased in a pattern of mild LVH. Systolic function was normal.  The estimated ejection fraction was in the range of 55% to 60%.  Indeterminant diastolic function. Wall motion was  normal; there  were no regional wall motion abnormalities.  - Aortic valve: There was no stenosis. Trivial regurgitation.  - Aorta: Mild aortic root dilation. Aortic root dimension: 44mm  (ED).  - Mitral valve: Trivial regurgitation.  - Left atrium: The atrium was at the upper limits of normal in size.  - Right ventricle: The cavity size was normal. Systolic function was  normal.  - Pulmonary arteries: PA peak pressure: 25mm Hg (S).  - Inferior vena cava: The vessel was normal in size; the  respirophasic diameter changes were in the normal range (= 50%);  findings are consistent with normal central venous pressure.  Impressions:   - Normal LV size with mild LV hypertrophy. EF 55-60%. Indeterminant  diastolic function. Normal RV size and systolic function. No  significant valvular dysfunction.   ETT 03/11/2011 9.5 METs, 7:37, no symptoms, achieved 98% MPHR. Baseline RBBB, no ST changes, no evidence of ischemia.   EKG:  EKG is personally reviewed.  The ekg ordered 01/22/21 demonstrates sinus bradycardia at 50 bpm, RBBB  Recent Labs: 01/08/2021: Hemoglobin 14.8; Platelets 232.0 04/15/2021: ALT 17; BUN 10; Creatinine, Ser 1.02; Potassium 4.2; Sodium 138  Recent Lipid Panel    Component Value Date/Time   CHOL 121 04/15/2021 1455   TRIG 116.0 04/15/2021 1455   HDL 33.00 (L) 04/15/2021 1455   CHOLHDL 4 04/15/2021 1455   VLDL 23.2 04/15/2021 1455   LDLCALC 65 04/15/2021 1455   LDLDIRECT 64.0 09/30/2016 0901    Physical Exam:    VS:  BP 98/64 (BP Location: Right Arm, Patient Position: Sitting, Cuff Size: Normal)   Pulse 64   Ht $R'6\' 1"'Qy$  (1.854 m)   Wt 204 lb (92.5 kg)   BMI 26.91 kg/m     Wt Readings from Last 3 Encounters:  05/09/21 204 lb (92.5 kg)  04/15/21 202 lb (91.6 kg)  02/18/21 211 lb (95.7 kg)    GEN: Well nourished, well developed in no acute distress HEENT: Normal, moist mucous membranes NECK: No JVD CARDIAC: regular rhythm, normal S1 and S2, no rubs or  gallops. No murmur. VASCULAR: Radial and DP pulses 2+ bilaterally. No carotid bruits RESPIRATORY:  Clear to auscultation without rales, wheezing or rhonchi  ABDOMEN: Soft, non-tender, non-distended MUSCULOSKELETAL:  Ambulates independently. In neck brace today. SKIN: Warm and dry, no edema NEUROLOGIC:  Alert and oriented x 3. No focal neuro deficits noted. PSYCHIATRIC:  Normal affect   ASSESSMENT:    1. Nonobstructive atherosclerosis of coronary artery   2. Medication management   3. Coronary artery calcification seen on CT scan   4. Type 2 diabetes mellitus without complication, without long-term current use of insulin (HCC)   5. Cardiac risk counseling   6. Counseling on health promotion and disease prevention    PLAN:    Chest pain: -reviewed CT cardiac. Very reassuring from a cardiac standpoint.  -coronary calcification,  low Ca score of 6. -counseled on red flag warning signs that need immediate medical attention -we discussed the data on statins, both in terms of their long term benefit as well as the risk of side effects. Reviewed common misconceptions about statins. Reviewed how we monitor treatment. After shared decision making, patient is agreeable to trialing statin. Recheck lipids/LFTs in 2-3 mos -given frequent spine injections, will hold on aspirin  Post-Covid shortness of breath: -being followed by pulmonology  Type II diabetes, not on insulin: -last A1c 6.3 -on sitagliptin  RBBB: -unchanged.  Cardiac risk counseling and prevention recommendations: -recommend heart healthy/Mediterranean diet, with whole grains, fruits, vegetable, fish, lean meats, nuts, and olive oil. Limit salt. -recommend moderate walking, 3-5 times/week for 30-50 minutes each session. Aim for at least 150 minutes.week. Goal should be pace of 3 miles/hours, or walking 1.5 miles in 30 minutes -recommend avoidance of tobacco products. Avoid excess alcohol. -ASCVD risk score: The ASCVD Risk  score Mikey Bussing DC Jr., et al., 2013) failed to calculate for the following reasons:   The valid total cholesterol range is 130 to 320 mg/dL    Plan for follow up: 1 year or sooner as needed  Buford Dresser, MD, PhD, [facility]    Medication Adjustments/Labs and Tests Ordered: Current medicines are reviewed at length with the patient today.  Concerns regarding medicines are outlined above.  Orders Placed This Encounter  Procedures  . Lipid panel  . Hepatic function panel   Meds ordered this encounter  Medications  . rosuvastatin (CRESTOR) 5 MG tablet    Sig: Take 1 tablet (5 mg total) by mouth daily.    Dispense:  90 tablet    Refill:  3    Patient Instructions  Medication Instructions:  Start taking Rosuvastatin 5 mg daily  *If you need a refill on your cardiac medications before your next appointment, please call your pharmacy*   Lab Work: Your physician recommends that you return for lab work in 3 months (Fasting lipids/LFT).  If you have labs (blood work) drawn today and your tests are completely normal, you will receive your results only by: Marland Kitchen MyChart Message (if you have MyChart) OR . A paper copy in the mail If you have any lab test that is abnormal or we need to change your treatment, we will call you to review the results.   Testing/Procedures: None ordered today   Follow-Up: At [facility], you and your health needs are our priority.  As part of our continuing mission to provide you with exceptional heart care, we have created designated Provider Care Teams.  These Care Teams include your primary Cardiologist (physician) and Advanced Practice Providers (APPs -  Physician Assistants and Nurse Practitioners) who all work together to provide you with the care you need, when you need it.  We recommend signing up for the patient portal called "MyChart".  Sign up information is provided on this After Visit Summary.  MyChart is used to  connect with patients for Virtual Visits (Telemedicine).  Patients are able to view lab/test results, encounter notes, upcoming appointments, etc.  Non-urgent messages can be sent to your provider as well.   To learn more about what you can do with MyChart, go to [email or web address].    Your next appointment:   1 year(s) @ [address] [place],  [address]   The format for your next appointment:   In Person  Provider:   Buford Dresser, MD  Signed, Buford Dresser, MD PhD 05/09/2021 2:51 PM    [place]

## 2021-05-09 NOTE — Patient Instructions (Signed)
Medication Instructions:  Start taking Rosuvastatin 5 mg daily  *If you need a refill on your cardiac medications before your next appointment, please call your pharmacy*   Lab Work: Your physician recommends that you return for lab work in 3 months (Fasting lipids/LFT).  If you have labs (blood work) drawn today and your tests are completely normal, you will receive your results only by: Marland Kitchen MyChart Message (if you have MyChart) OR . A paper copy in the mail If you have any lab test that is abnormal or we need to change your treatment, we will call you to review the results.   Testing/Procedures: None ordered today   Follow-Up: At [facility], you and your health needs are our priority.  As part of our continuing mission to provide you with exceptional heart care, we have created designated Provider Care Teams.  These Care Teams include your primary Cardiologist (physician) and Advanced Practice Providers (APPs -  Physician Assistants and Nurse Practitioners) who all work together to provide you with the care you need, when you need it.  We recommend signing up for the patient portal called "MyChart".  Sign up information is provided on this After Visit Summary.  MyChart is used to connect with patients for Virtual Visits (Telemedicine).  Patients are able to view lab/test results, encounter notes, upcoming appointments, etc.  Non-urgent messages can be sent to your provider as well.   To learn more about what you can do with MyChart, go to [email or web address].    Your next appointment:   1 year(s) @ [address] [place], [place] [address]   The format for your next appointment:   In Person  Provider:   [name], MD

## 2021-05-13 ENCOUNTER — Encounter: Payer: Self-pay | Admitting: Cardiology

## 2021-05-22 ENCOUNTER — Telehealth: Payer: Self-pay | Admitting: Internal Medicine

## 2021-05-22 NOTE — Telephone Encounter (Signed)
Type of form received: Patient Assistance   Additional comments:   Received by: [place] R   Form should be Faxed to: N/A    Form should be mailed to:  [address], [place], [place], [address]  Is patient requesting call for pickup: N   Form placed in the Provider's box.  *Attach charge sheet.  Provider will determine charge.*  Was patient informed of  7-10 business day turn around (Y/N)? N

## 2021-05-23 NOTE — Telephone Encounter (Signed)
Patient assistance form completed and placed in Dr. [name] office to be signed.

## 2021-05-29 ENCOUNTER — Other Ambulatory Visit: Payer: Self-pay | Admitting: Internal Medicine

## 2021-06-27 ENCOUNTER — Ambulatory Visit: Payer: BC Managed Care – PPO | Admitting: Internal Medicine

## 2021-07-03 DIAGNOSIS — M5136 Other intervertebral disc degeneration, lumbar region: Secondary | ICD-10-CM | POA: Diagnosis not present

## 2021-07-03 DIAGNOSIS — G894 Chronic pain syndrome: Secondary | ICD-10-CM | POA: Diagnosis not present

## 2021-07-03 DIAGNOSIS — E119 Type 2 diabetes mellitus without complications: Secondary | ICD-10-CM | POA: Diagnosis not present

## 2021-07-03 DIAGNOSIS — M5030 Other cervical disc degeneration, unspecified cervical region: Secondary | ICD-10-CM | POA: Diagnosis not present

## 2021-07-26 ENCOUNTER — Telehealth: Payer: Self-pay | Admitting: Pulmonary Disease

## 2021-07-26 NOTE — Telephone Encounter (Signed)
Pt is calling to verify if his disability paperwork was received to our office. Pls regard; [phone].

## 2021-07-29 NOTE — Telephone Encounter (Signed)
Called patient and left voice message that we have not received disability paperwork for him.  I advised him to contact his PCP and cardiologist.  We have not seen him since June 2022.  Also gave him our fax# [phone].

## 2021-08-26 ENCOUNTER — Encounter (HOSPITAL_BASED_OUTPATIENT_CLINIC_OR_DEPARTMENT_OTHER): Payer: Self-pay

## 2021-09-02 DIAGNOSIS — M545 Low back pain, unspecified: Secondary | ICD-10-CM | POA: Diagnosis not present

## 2021-09-02 DIAGNOSIS — M5459 Other low back pain: Secondary | ICD-10-CM | POA: Diagnosis not present

## 2021-09-02 DIAGNOSIS — M542 Cervicalgia: Secondary | ICD-10-CM | POA: Diagnosis not present

## 2021-09-12 NOTE — Telephone Encounter (Signed)
LMTCB

## 2021-09-15 DIAGNOSIS — M545 Low back pain, unspecified: Secondary | ICD-10-CM | POA: Diagnosis not present

## 2021-09-16 NOTE — Progress Notes (Addendum)
Subjective:    Patient ID: [name], male    DOB: [birth year], 55 y.o.   MRN: [id number]  This visit occurred during the SARS-CoV-2 public health emergency.  Safety protocols were in place, including screening questions prior to the visit, additional usage of staff PPE, and extensive cleaning of exam room while observing appropriate contact time as indicated for disinfecting solutions.    HPI The patient is here for an acute visit.  We will also follow-up on his chronic medical problems since he has an appointment next month and we will cancel that.  He is taking his chronic medication daily.   Low energy, not feeling good - he is not exercising.  If he feels good he can walk to the mailbox.  He can not walk further due to his hip pain and getting tired.   OSA - moderate - not on cpap-he did not tolerate CPAP machine.  He does snore.   Mouth is dry at night.  He feels gerd at times.  He wonders if the GERD at night is causing the sore throat.  He wonders if he had an infection or has an infection.  The dryness was pretty significant.  He does not drink much water throughout the day.   Neck pain - he may have neck surgery - he is not sure.  He is taking tramadol, ibuprofen, gabapentin.    Right hip pain -  had MRI on his lower back and will f/u regarding that.       Medications and allergies reviewed with patient and updated if appropriate.  Patient Active Problem List   Diagnosis Date Noted   Hyperlipidemia 09/17/2021   B12 deficiency 04/15/2021   Post-COVID chronic dyspnea 01/10/2021   Generalized abdominal pain 02/28/2020   Abdominal distention 02/28/2020   Chronic constipation 10/10/2019   Acute left-sided low back pain 04/15/2019   Chronic insomnia 03/24/2019   Abdominal wall hernia 10/06/2018   Arthralgia 06/01/2017   Other chest pain 06/01/2017   Fatigue 06/01/2017   Diabetes (HCC) 10/01/2016   Meralgia paresthetica of right side 01/25/2016   Vitamin D  deficiency 01/25/2016   SBO (small bowel obstruction) (HCC) 01/02/2016   Allergic rhinitis 02/07/2011   OBSTRUCTIVE SLEEP APNEA, moderate 10/23/2010   GERD 03/12/2010   Irritable bowel syndrome 03/12/2010   DIARRHEA, RECURRENT 02/05/2010    Current Outpatient Medications on File Prior to Visit  Medication Sig Dispense Refill   cetirizine (ZYRTEC) 10 MG tablet Take 10 mg by mouth daily as needed for allergies.     gabapentin (NEURONTIN) 300 MG capsule Take 1-2 capsules by mouth at bedtime.     ibuprofen (ADVIL) 800 MG tablet Take 800 mg by mouth 3 (three) times daily.     Lactobacillus (PROBIOTIC ACIDOPHILUS) CAPS Taking daily     pantoprazole (PROTONIX) 40 MG tablet TAKE 1 TABLET BY MOUTH EVERY DAY 90 tablet 1   rosuvastatin (CRESTOR) 5 MG tablet Take 1 tablet (5 mg total) by mouth daily. 90 tablet 3   sildenafil (REVATIO) 20 MG tablet SMARTSIG:3-5 Tablet(s) By Mouth PRN     sitaGLIPtin (JANUVIA) 100 MG tablet Take 1 tablet (100 mg total) by mouth daily. 90 tablet 1   traMADol (ULTRAM) 50 MG tablet tramadol 50 mg tablet  Take 1 tablet 3 times a day by oral route as needed.     No current facility-administered medications on file prior to visit.    Past Medical History:  Diagnosis Date  Allergy    Chest wall pain    Cough    Diabetes (HCC)    Diarrhea recurrent   GERD (gastroesophageal reflux disease)    Headache(784.0)    Irritable bowel syndrome    Obstructive sleep apnea    apnealink 10/23/10 AHI 5, Sp02 low 87%   Postural lightheadedness    SBO (small bowel obstruction) (HCC) 12/2015   Sleep apnea    Small bowel obstruction (HCC)     Past Surgical History:  Procedure Laterality Date   CHOLECYSTECTOMY  2009   COLECTOMY  1994   segmental   COLONOSCOPY  07/17/2011   hemorrhoids, otherwise normal including terminal ileum and random colon biopises   GASTROSTOMY  2005   stamm   LAPAROTOMY  2002, 2005, 2009   SPHINCTEROTOMY  2009   lateral internal   UPPER  GASTROINTESTINAL ENDOSCOPY  07/17/2011   normal, including duodenal biopsies    Social History   Socioeconomic History   Marital status: Married    Spouse name: Not on file   Number of children: Not on file   Years of education: Not on file   Highest education level: Not on file  Occupational History   Occupation: FORK LIFT DRIVER    Employer: [employer]  Tobacco Use   Smoking status: Never   Smokeless tobacco: Never  Vaping Use   Vaping Use: Never used  Substance and Sexual Activity   Alcohol use: No   Drug use: No   Sexual activity: Not on file  Other Topics Concern   Not on file  Social History Narrative   Occupation: Group Lead Scientist, research (physical sciences) (deliveries)   Patient has never smoked.   Alcohol Use-no   Illicit Drug Use-no   Married-Divorced-Married again 2009-    3 children 2 sons and 1 daughter   [place] heritage   No regular exercise   Social Determinants of Health   Financial Resource Strain: Not on file  Food Insecurity: Not on file  Transportation Needs: Not on file  Physical Activity: Not on file  Stress: Not on file  Social Connections: Not on file    Family History  Problem Relation Age of Onset   Liver disease Father        liver cancer???   Hypertension Mother    Pancreatic cancer Sister    Colon cancer Neg Hx    Esophageal cancer Neg Hx    Rectal cancer Neg Hx    Stomach cancer Neg Hx     Review of Systems  Constitutional:  Positive for fatigue. Negative for chills and fever.  Respiratory:  Negative for cough, shortness of breath and wheezing.   Cardiovascular:  Positive for chest pain (occ) and palpitations (occ with moderate activity). Negative for leg swelling.  Gastrointestinal:  Negative for abdominal pain and nausea.  Neurological:  Positive for headaches (occ). Negative for light-headedness.      Objective:   Vitals:   09/17/21 1500  BP: 106/74  Pulse: (!) 59  Temp: 98.2 F (36.8 C)  SpO2: 99%   BP  Readings from Last 3 Encounters:  09/17/21 106/74  05/09/21 98/64  04/18/21 101/80   Wt Readings from Last 3 Encounters:  09/17/21 204 lb (92.5 kg)  05/09/21 204 lb (92.5 kg)  04/15/21 202 lb (91.6 kg)   Body mass index is 26.91 kg/m.   Physical Exam    Constitutional: Appears well-developed and well-nourished. No distress.  Head: Normocephalic and atraumatic.  Neck: Neck supple. No tracheal  deviation present. No thyromegaly present.  No cervical lymphadenopathy Cardiovascular: Normal rate, regular rhythm and normal heart sounds.  No murmur heard. No carotid bruit .  No edema Pulmonary/Chest: Effort normal and breath sounds normal. No respiratory distress. No has no wheezes. No rales. Abdomen: Soft, nontender, nondistended Skin: Skin is warm and dry. Not diaphoretic.  Psychiatric: Normal mood and affect. Behavior is normal.       Assessment & Plan:    See Problem List for Assessment and Plan of chronic medical problems.

## 2021-09-17 ENCOUNTER — Encounter: Payer: Self-pay | Admitting: Internal Medicine

## 2021-09-17 ENCOUNTER — Ambulatory Visit (INDEPENDENT_AMBULATORY_CARE_PROVIDER_SITE_OTHER): Payer: BC Managed Care – PPO | Admitting: Internal Medicine

## 2021-09-17 ENCOUNTER — Other Ambulatory Visit: Payer: Self-pay

## 2021-09-17 VITALS — BP 106/74 | HR 59 | Temp 98.2°F | Ht 73.0 in | Wt 204.0 lb

## 2021-09-17 DIAGNOSIS — E785 Hyperlipidemia, unspecified: Secondary | ICD-10-CM | POA: Insufficient documentation

## 2021-09-17 DIAGNOSIS — R5383 Other fatigue: Secondary | ICD-10-CM

## 2021-09-17 DIAGNOSIS — G4733 Obstructive sleep apnea (adult) (pediatric): Secondary | ICD-10-CM

## 2021-09-17 DIAGNOSIS — E782 Mixed hyperlipidemia: Secondary | ICD-10-CM

## 2021-09-17 DIAGNOSIS — K219 Gastro-esophageal reflux disease without esophagitis: Secondary | ICD-10-CM

## 2021-09-17 DIAGNOSIS — E538 Deficiency of other specified B group vitamins: Secondary | ICD-10-CM

## 2021-09-17 DIAGNOSIS — E1169 Type 2 diabetes mellitus with other specified complication: Secondary | ICD-10-CM | POA: Insufficient documentation

## 2021-09-17 DIAGNOSIS — E559 Vitamin D deficiency, unspecified: Secondary | ICD-10-CM

## 2021-09-17 DIAGNOSIS — E119 Type 2 diabetes mellitus without complications: Secondary | ICD-10-CM | POA: Diagnosis not present

## 2021-09-17 DIAGNOSIS — R682 Dry mouth, unspecified: Secondary | ICD-10-CM

## 2021-09-17 LAB — CBC WITH DIFFERENTIAL/PLATELET
Basophils Absolute: 0 10*3/uL (ref 0.0–0.1)
Basophils Relative: 0.5 % (ref 0.0–3.0)
Eosinophils Absolute: 0.1 10*3/uL (ref 0.0–0.7)
Eosinophils Relative: 2.1 % (ref 0.0–5.0)
HCT: 42 % (ref 39.0–52.0)
Hemoglobin: 14.1 g/dL (ref 13.0–17.0)
Lymphocytes Relative: 55.2 % — ABNORMAL HIGH (ref 12.0–46.0)
Lymphs Abs: 2.4 10*3/uL (ref 0.7–4.0)
MCHC: 33.6 g/dL (ref 30.0–36.0)
MCV: 83 fl (ref 78.0–100.0)
Monocytes Absolute: 0.4 10*3/uL (ref 0.1–1.0)
Monocytes Relative: 8.7 % (ref 3.0–12.0)
Neutro Abs: 1.4 10*3/uL (ref 1.4–7.7)
Neutrophils Relative %: 33.5 % — ABNORMAL LOW (ref 43.0–77.0)
Platelets: 192 10*3/uL (ref 150.0–400.0)
RBC: 5.06 Mil/uL (ref 4.22–5.81)
RDW: 13.8 % (ref 11.5–15.5)
WBC: 4.3 10*3/uL (ref 4.0–10.5)

## 2021-09-17 LAB — COMPREHENSIVE METABOLIC PANEL
ALT: 22 U/L (ref 0–53)
AST: 21 U/L (ref 0–37)
Albumin: 4.5 g/dL (ref 3.5–5.2)
Alkaline Phosphatase: 58 U/L (ref 39–117)
BUN: 13 mg/dL (ref 6–23)
CO2: 28 mEq/L (ref 19–32)
Calcium: 9.7 mg/dL (ref 8.4–10.5)
Chloride: 101 mEq/L (ref 96–112)
Creatinine, Ser: 0.98 mg/dL (ref 0.40–1.50)
GFR: 87.87 mL/min (ref >60.00)
Glucose, Bld: 97 mg/dL (ref 70–99)
Potassium: 4 mEq/L (ref 3.5–5.1)
Sodium: 137 mEq/L (ref 135–145)
Total Bilirubin: 0.9 mg/dL (ref 0.2–1.2)
Total Protein: 7.7 g/dL (ref 6.0–8.3)

## 2021-09-17 LAB — TSH: TSH: 0.82 u[IU]/mL (ref 0.35–5.50)

## 2021-09-17 LAB — LIPID PANEL
Cholesterol: 74 mg/dL (ref 0–200)
HDL: 31 mg/dL — ABNORMAL LOW (ref >39.00)
LDL Cholesterol: 30 mg/dL (ref 0–99)
NonHDL: 43.11
Total CHOL/HDL Ratio: 2
Triglycerides: 64 mg/dL (ref 0.0–149.0)
VLDL: 12.8 mg/dL (ref 0.0–40.0)

## 2021-09-17 LAB — HEMOGLOBIN A1C: Hgb A1c MFr Bld: 6.4 % (ref 4.6–6.5)

## 2021-09-17 LAB — MICROALBUMIN / CREATININE URINE RATIO
Creatinine,U: 226.8 mg/dL
Microalb Creat Ratio: 0.8 mg/g (ref 0.0–30.0)
Microalb, Ur: 1.7 mg/dL (ref 0.0–1.9)

## 2021-09-17 LAB — VITAMIN B12: Vitamin B-12: 776 pg/mL (ref 211–911)

## 2021-09-17 LAB — VITAMIN D 25 HYDROXY (VIT D DEFICIENCY, FRACTURES): VITD: 69.87 ng/mL (ref 30.00–100.00)

## 2021-09-17 MED ORDER — FAMOTIDINE 40 MG PO TABS: 40.0000 mg | ORAL_TABLET | Freq: Every day | ORAL | 1 refills | Status: DC

## 2021-09-17 NOTE — Assessment & Plan Note (Signed)
Chronic He does not feel that his GERD is well controlled Continue pantoprazole 40 mg daily Start famotidine 40 mg daily at bedtime

## 2021-09-17 NOTE — Patient Instructions (Addendum)
  Blood work was ordered.     Medications changes include :   start pecpid 40 mg at bedtime.   Your prescription(s) have been submitted to your pharmacy. Please take as directed and contact our office if you believe you are having problem(s) with the medication(s).   A referral was ordered for  Dr Craige Cotta.      Someone from their office will call you to schedule an appointment.     Please followup in 6 months

## 2021-09-17 NOTE — Assessment & Plan Note (Signed)
Chronic He has had issues with fatigue in the past This is likely multifactorial and related to- Untreated sleep apnea-referred to pulmonary to discuss treatment options-did not tolerate CPAP He is fairly sedentary-is progressed increasing activity-advised he needs to be doing as much as he can do with his current pain Some of his medications may be contributing-he is taking tramadol and gabapentin Will check CBC, CMP, TSH, vitamin levels Depending on above results could also consider starting an antidepressant for possible underlying depression

## 2021-09-17 NOTE — Assessment & Plan Note (Signed)
Acute Likely multifactorial Discussed that I do not think he has an infection His sleep apnea is likely contributing, GERD could be contributing since he does express symptoms of GERD at other times and discussed that some of his medications may be contributing as well He does not drink much water throughout the day and needs to increase his water intake Will revise GERD medication to better control Will refer to pulmonary for options regarding sleep apnea treatment

## 2021-09-17 NOTE — Assessment & Plan Note (Signed)
Chronic Regular exercise and healthy diet encouraged Check lipid panel  Continue Crestor 5 mg daily 

## 2021-09-17 NOTE — Assessment & Plan Note (Signed)
Chronic Lab Results  Component Value Date   HGBA1C 6.3 04/15/2021   Has been controlled Continue januvia 100 mg daily Check a1c

## 2021-09-17 NOTE — Assessment & Plan Note (Signed)
Chronic Taking vitamin B12 1000 mcg daily Will check vitamin B12 level

## 2021-09-17 NOTE — Assessment & Plan Note (Signed)
Chronic Diagnosed with moderate sleep apnea in the past Did not tolerate CPAP Discussed that this is likely causing his fatigue and some of his dry mouth, which is worse at night Will refer back to Dr. Craige Cotta to discuss options

## 2021-09-17 NOTE — Assessment & Plan Note (Signed)
Chronic Taking vitamin D daily Check vitamin D level  

## 2021-09-18 ENCOUNTER — Telehealth: Payer: Self-pay | Admitting: Internal Medicine

## 2021-09-18 NOTE — Telephone Encounter (Signed)
Please call patient after 12:30 with lab results.

## 2021-09-19 NOTE — Telephone Encounter (Signed)
Patient has already seen and reviewed results.

## 2021-09-19 NOTE — Telephone Encounter (Signed)
Written by [name], MD on 09/17/2021  8:47 PM EDT  Seen by patient [name] on 09/18/2021  4:37 PM

## 2021-09-23 DIAGNOSIS — M545 Low back pain, unspecified: Secondary | ICD-10-CM | POA: Diagnosis not present

## 2021-09-23 DIAGNOSIS — Z79899 Other long term (current) drug therapy: Secondary | ICD-10-CM | POA: Diagnosis not present

## 2021-09-24 LAB — HEPATIC FUNCTION PANEL
ALT: 23 IU/L (ref 0–44)
AST: 23 IU/L (ref 0–40)
Albumin: 4.9 g/dL (ref 3.8–4.9)
Alkaline Phosphatase: 66 IU/L (ref 44–121)
Bilirubin Total: 0.9 mg/dL (ref 0.0–1.2)
Bilirubin, Direct: 0.26 mg/dL (ref 0.00–0.40)
Total Protein: 7.7 g/dL (ref 6.0–8.5)

## 2021-09-24 LAB — LIPID PANEL
Chol/HDL Ratio: 2.6 ratio (ref 0.0–5.0)
Cholesterol, Total: 87 mg/dL — ABNORMAL LOW (ref 100–199)
HDL: 33 mg/dL — ABNORMAL LOW (ref >39)
LDL Chol Calc (NIH): 38 mg/dL (ref 0–99)
Triglycerides: 75 mg/dL (ref 0–149)
VLDL Cholesterol Cal: 16 mg/dL (ref 5–40)

## 2021-10-04 ENCOUNTER — Telehealth: Payer: Self-pay | Admitting: Internal Medicine

## 2021-10-04 NOTE — Telephone Encounter (Signed)
Patient calling to request lab results and check status on disability referral  Please call patient

## 2021-10-04 NOTE — Telephone Encounter (Signed)
Spoke with patient today.  He is having paperwork faxed

## 2021-10-14 ENCOUNTER — Ambulatory Visit: Payer: BC Managed Care – PPO | Admitting: Internal Medicine

## 2021-10-15 ENCOUNTER — Ambulatory Visit: Payer: BC Managed Care – PPO | Admitting: Internal Medicine

## 2021-10-26 ENCOUNTER — Other Ambulatory Visit: Payer: Self-pay | Admitting: Internal Medicine

## 2021-10-28 NOTE — Telephone Encounter (Signed)
LMTCB.   Will send letter.   Will close encounter per protocol.

## 2021-11-05 ENCOUNTER — Other Ambulatory Visit: Payer: Self-pay | Admitting: Internal Medicine

## 2021-11-25 ENCOUNTER — Other Ambulatory Visit: Payer: Self-pay

## 2021-11-25 ENCOUNTER — Ambulatory Visit (INDEPENDENT_AMBULATORY_CARE_PROVIDER_SITE_OTHER): Payer: BC Managed Care – PPO | Admitting: Adult Health

## 2021-11-25 ENCOUNTER — Encounter: Payer: Self-pay | Admitting: *Deleted

## 2021-11-25 VITALS — BP 110/80 | HR 85 | Temp 97.8°F | Ht 73.0 in | Wt 210.4 lb

## 2021-11-25 DIAGNOSIS — G4733 Obstructive sleep apnea (adult) (pediatric): Secondary | ICD-10-CM

## 2021-11-25 NOTE — Patient Instructions (Signed)
Restart CPAP At bedtime   Set up for CPAP titration study .  Healthy sleep regimen .  Do not drive if sleepy  Follow up with Dr. Craige Cotta  in 3 months and  As needed

## 2021-11-25 NOTE — Progress Notes (Signed)
@Patient  ID: [name], male    DOB: [birth year], 55 y.o.   MRN: [id number]  Chief Complaint  Patient presents with   Follow-up    Referring provider: Binnie Rail, MD  HPI: 55 yo male followed by OSA . Hx with Covid 19 11/2020 , with chronic cough - resolved. Hx of DM .   TEST/EVENTS :  HST 06/29/18 >> AHI 18.1, SaO2 low 87%. PFT 02/18/21 >> FEV1 3.91 (92%), FEV1% 81, TLC 7.07 (93%)   11/25/2021 Follow up : OSA  Patient returns for follow up for OSA.Marland Kitchen Has moderate OSA on previous sleep study. Has tried CPAP couple times but unable to tolerate . Tried oral appliance could not tolerate  Complains of ongoing fatigue, daytime sleepiness, snoring . Wife is not sleeping well due to severe snoring . Going to lose insurance at end of month .  No longer has CPAP , turned it back into DME.  Wants to see what else is out there for OSA      Allergies  Allergen Reactions   Metformin And Related     diarrhea    Immunization History  Administered Date(s) Administered   Influenza Inj Mdck Quad Pf 08/25/2018, 08/29/2021   Influenza Split 08/10/2017   Influenza Whole 09/07/2010   Influenza,inj,Quad PF,6+ Mos 08/18/2017, 08/29/2019, 08/29/2020   Influenza-Unspecified 08/20/2016   Pneumococcal Polysaccharide-23 12/04/2016   Tdap 04/17/2011   Zoster Recombinat (Shingrix) 10/06/2018    Past Medical History:  Diagnosis Date   Allergy    Chest wall pain    Cough    Diabetes (HCC)    Diarrhea recurrent   GERD (gastroesophageal reflux disease)    Headache(784.0)    Irritable bowel syndrome    Obstructive sleep apnea    apnealink 10/23/10 AHI 5, Sp02 low 87%   Postural lightheadedness    SBO (small bowel obstruction) ([place]) 12/2015   Sleep apnea    Small bowel obstruction (HCC)     Tobacco History: Social History   Tobacco Use  Smoking Status Never  Smokeless Tobacco Never   Counseling given: Not Answered   Outpatient Medications Prior to Visit  Medication Sig  Dispense Refill   cetirizine (ZYRTEC) 10 MG tablet Take 10 mg by mouth daily as needed for allergies.     famotidine (PEPCID) 40 MG tablet Take 1 tablet (40 mg total) by mouth at bedtime. 90 tablet 1   gabapentin (NEURONTIN) 300 MG capsule Take 1-2 capsules by mouth at bedtime.     ibuprofen (ADVIL) 800 MG tablet Take 800 mg by mouth 3 (three) times daily.     JANUVIA 100 MG tablet TAKE ONE TABLET BY MOUTH EVERY DAY 90 tablet 0   Lactobacillus (PROBIOTIC ACIDOPHILUS) CAPS Taking daily     pantoprazole (PROTONIX) 40 MG tablet TAKE 1 TABLET BY MOUTH EVERY DAY 90 tablet 1   rosuvastatin (CRESTOR) 5 MG tablet Take 1 tablet (5 mg total) by mouth daily. 90 tablet 3   sildenafil (REVATIO) 20 MG tablet SMARTSIG:3-5 Tablet(s) By Mouth PRN     traMADol (ULTRAM) 50 MG tablet tramadol 50 mg tablet  Take 1 tablet 3 times a day by oral route as needed.     No facility-administered medications prior to visit.     Review of Systems:   Constitutional:   No  weight loss, night sweats,  Fevers, chills,  +fatigue, or  lassitude.  HEENT:   No headaches,  Difficulty swallowing,  Tooth/dental problems, or  Sore throat,  No sneezing, itching, ear ache, nasal congestion, post nasal drip,   CV:  No chest pain,  Orthopnea, PND, swelling in lower extremities, anasarca, dizziness, palpitations, syncope.   GI  No heartburn, indigestion, abdominal pain, nausea, vomiting, diarrhea, change in bowel habits, loss of appetite, bloody stools.   Resp: No shortness of breath with exertion or at rest.  No excess mucus, no productive cough,  No non-productive cough,  No coughing up of blood.  No change in color of mucus.  No wheezing.  No chest wall deformity  Skin: no rash or lesions.  GU: no dysuria, change in color of urine, no urgency or frequency.  No flank pain, no hematuria   MS:  No joint pain or swelling.  No decreased range of motion.  No back pain.    Physical Exam  BP 110/80 (BP Location:  Left Arm, Patient Position: Sitting, Cuff Size: Normal)    Pulse 85    Temp 97.8 F (36.6 C) (Oral)    Ht 6\' 1"  (1.854 m)    Wt 210 lb 6.4 oz (95.4 kg)    SpO2 99%    BMI 27.76 kg/m   GEN: A/Ox3; pleasant , NAD, well nourished    HEENT:  [place]/AT,   NOSE-clear, THROAT-clear, no lesions, no postnasal drip or exudate noted. Class 2-3 MP airway   NECK:  Supple w/ fair ROM; no JVD; normal carotid impulses w/o bruits; no thyromegaly or nodules palpated; no lymphadenopathy.    RESP  Clear  P & A; w/o, wheezes/ rales/ or rhonchi. no accessory muscle use, no dullness to percussion  CARD:  RRR, no m/r/g, no peripheral edema, pulses intact, no cyanosis or clubbing.  GI:   Soft & nt; nml bowel sounds; no organomegaly or masses detected.   Musco: Warm bil, no deformities or joint swelling noted.   Neuro: alert, no focal deficits noted.    Skin: Warm, no lesions or rashes    Lab Results:      BNP No results found for: BNP  ProBNP No results found for: PROBNP  Imaging: No results found.    PFT Results Latest Ref Rng & Units 02/18/2021  FVC-Pre L 4.59  FVC-Predicted Pre % 83  FVC-Post L 4.21  FVC-Predicted Post % 76  Pre FEV1/FVC % % 85  Post FEV1/FCV % % 76  FEV1-Pre L 3.91  FEV1-Predicted Pre % 92  FEV1-Post L 3.21  DLCO uncorrected ml/min/mmHg 27.86  DLCO UNC% % 88  DLCO corrected ml/min/mmHg 27.71  DLCO COR %Predicted % 87  DLVA Predicted % 103  TLC L 7.07  TLC % Predicted % 93  RV % Predicted % 92    No results found for: NITRICOXIDE      Assessment & Plan:   No problem-specific Assessment & Plan notes found for this encounter.     02/20/2021, NP 11/25/2021

## 2021-11-25 NOTE — Progress Notes (Signed)
Reviewed and agree with assessment/plan.   Coralyn Helling, MD [facility] Pulmonary/Critical Care 11/25/2021, 5:30 PM Pager:  [phone]

## 2021-11-26 ENCOUNTER — Telehealth: Payer: Self-pay | Admitting: Pulmonary Disease

## 2021-11-26 NOTE — Telephone Encounter (Signed)
Call returned to [facility], confirmed patient DOB. Will need cpap titration results. Made aware it has not yet resulted and been scanned I however we would hold in triage and check daily and send when available. Voiced understanding.   Fax # [phone]

## 2021-11-27 ENCOUNTER — Encounter: Payer: Self-pay | Admitting: Diagnostic Neuroimaging

## 2021-11-27 ENCOUNTER — Ambulatory Visit (INDEPENDENT_AMBULATORY_CARE_PROVIDER_SITE_OTHER): Payer: BC Managed Care – PPO | Admitting: Diagnostic Neuroimaging

## 2021-11-27 ENCOUNTER — Telehealth: Payer: Self-pay | Admitting: Diagnostic Neuroimaging

## 2021-11-27 VITALS — BP 128/67 | HR 82 | Ht 73.0 in | Wt 205.0 lb

## 2021-11-27 DIAGNOSIS — R200 Anesthesia of skin: Secondary | ICD-10-CM | POA: Diagnosis not present

## 2021-11-27 DIAGNOSIS — E119 Type 2 diabetes mellitus without complications: Secondary | ICD-10-CM | POA: Diagnosis not present

## 2021-11-27 DIAGNOSIS — R531 Weakness: Secondary | ICD-10-CM

## 2021-11-27 DIAGNOSIS — G8929 Other chronic pain: Secondary | ICD-10-CM | POA: Diagnosis not present

## 2021-11-27 NOTE — Telephone Encounter (Signed)
Scheduled pt for NCV/EMG with Dr. Epimenio Foot 12/28 at 1:15 pm.

## 2021-11-27 NOTE — Patient Instructions (Signed)
°  FATIGUE, WEAKNESS, NUMBNESS, PAIN, MEMORY LOSS - unclear etiology; check MRI brain, EMG/NCS, lab testing

## 2021-11-27 NOTE — Assessment & Plan Note (Signed)
Patient with moderate obstructive sleep apnea.  He has significant symptom burden.  Unfortunately patient will be losing his insurance.  We will try to set him up for a new CPAP before the end of the year.  Also would like for him to have a CPAP titration study if possible.  If patient is unable to tolerate CPAP could consider inspire device.  However explained that these are very expensive so would have to look into financial aspect Patient education given on sleep apnea, CPAP and healthy sleep regimen  Plan  . Patient Instructions  Restart CPAP At bedtime   Set up for CPAP titration study .  Healthy sleep regimen .  Do not drive if sleepy  Follow up with Dr. Craige Cotta  in 3 months and  As needed

## 2021-11-27 NOTE — Progress Notes (Signed)
[facility]  PATIENT: [name] DOB: 1966-07-15  REFERRING CLINICIAN: Melina Schools, MD HISTORY FROM: patient  REASON FOR VISIT: new consult    HISTORICAL  CHIEF COMPLAINT:  Chief Complaint  Patient presents with   General weakness    Rm 7 New Pt "very weak, stay in bed all the time"     HISTORY OF PRESENT ILLNESS:   55 year old male here for evaluation of generalized fatigue, numbness, weakness, pain, memory loss.  Symptoms started around April 2021.  Initially he went to PCP and orthopedic clinic for neck and low back pain evaluations.  Symptoms were managed conservatively.  Due to progressive symptoms patient was referred here for further evaluation.  Patient has 4 brothers and 1 sister who live around the world, and also been having problems with weakness, pain and numbness.  No specific etiology has been found.   REVIEW OF SYSTEMS: Full 14 system review of systems performed and negative with exception of: as per HPI.  ALLERGIES: Allergies  Allergen Reactions   Metformin And Related     diarrhea    HOME MEDICATIONS: Outpatient Medications Prior to Visit  Medication Sig Dispense Refill   cetirizine (ZYRTEC) 10 MG tablet Take 10 mg by mouth daily as needed for allergies.     famotidine (PEPCID) 40 MG tablet Take 1 tablet (40 mg total) by mouth at bedtime. 90 tablet 1   gabapentin (NEURONTIN) 300 MG capsule Take 1-2 capsules by mouth at bedtime.     ibuprofen (ADVIL) 800 MG tablet Take 800 mg by mouth 3 (three) times daily.     JANUVIA 100 MG tablet TAKE ONE TABLET BY MOUTH EVERY DAY 90 tablet 0   Lactobacillus (PROBIOTIC ACIDOPHILUS) CAPS Taking daily     pantoprazole (PROTONIX) 40 MG tablet TAKE 1 TABLET BY MOUTH EVERY DAY 90 tablet 1   rosuvastatin (CRESTOR) 5 MG tablet Take 1 tablet (5 mg total) by mouth daily. 90 tablet 3   sildenafil (REVATIO) 20 MG tablet SMARTSIG:3-5 Tablet(s) By Mouth PRN     traMADol (ULTRAM) 50 MG tablet tramadol  50 mg tablet  Take 1 tablet 3 times a day by oral route as needed.     vitamin C (ASCORBIC ACID) 500 MG tablet Take 500 mg by mouth daily.     No facility-administered medications prior to visit.    PAST MEDICAL HISTORY: Past Medical History:  Diagnosis Date   Allergy    Chest wall pain    Cough    Diabetes ([place])    Diarrhea recurrent   GERD (gastroesophageal reflux disease)    Headache(784.0)    Irritable bowel syndrome    Obstructive sleep apnea    apnealink 10/23/10 AHI 5, Sp02 low 87%   Postural lightheadedness    SBO (small bowel obstruction) ([place]) 12/2015   Sleep apnea    11/27/21 will get new test   Small bowel obstruction [facility])     PAST SURGICAL HISTORY: Past Surgical History:  Procedure Laterality Date   CHOLECYSTECTOMY  2009   COLECTOMY  1994   segmental   COLONOSCOPY  07/17/2011   hemorrhoids, otherwise normal including terminal ileum and random colon biopises   GASTROSTOMY  2005   stamm   LAPAROTOMY  2002, 2005, 2009   SPHINCTEROTOMY  2009   lateral internal   UPPER GASTROINTESTINAL ENDOSCOPY  07/17/2011   normal, including duodenal biopsies    FAMILY HISTORY: Family History  Problem Relation Age of Onset   Hypertension Mother  Liver disease Father        liver cancer???   Colon cancer Neg Hx    Esophageal cancer Neg Hx    Rectal cancer Neg Hx    Stomach cancer Neg Hx     SOCIAL HISTORY: Social History   Socioeconomic History   Marital status: Married    Spouse name: Not on file   Number of children: 4   Years of education: Not on file   Highest education level: Associate degree: occupational, Hotel manager, or vocational program  Occupational History   Occupation: FORK LIFT DRIVER    Employer: [employer]: 11/27/21 out of work  Tobacco Use   Smoking status: Never   Smokeless tobacco: Never  Vaping Use   Vaping Use: Never used  Substance and Sexual Activity   Alcohol use: Never   Drug use: Never   Sexual activity: Not  on file  Other Topics Concern   Not on file  Social History Narrative   Occupation: Group Lead Tax adviser (deliveries)   Patient has never smoked.   Alcohol Use-no   Illicit Drug Use-no   Married-Divorced-Married again 2009-    3 children 2 sons and 1 daughter   [place] heritage   No regular exercise   Social Determinants of Health   Financial Resource Strain: Not on file  Food Insecurity: Not on file  Transportation Needs: Not on file  Physical Activity: Not on file  Stress: Not on file  Social Connections: Not on file  Intimate Partner Violence: Not on file     PHYSICAL EXAM  GENERAL EXAM/CONSTITUTIONAL: Vitals:  Vitals:   11/27/21 1111  BP: 128/67  Pulse: 82  Weight: 205 lb (93 kg)  Height: 6' 1"  (1.854 m)   Body mass index is 27.05 kg/m. Wt Readings from Last 3 Encounters:  11/27/21 205 lb (93 kg)  11/25/21 210 lb 6.4 oz (95.4 kg)  09/17/21 204 lb (92.5 kg)   Patient is in no distress; well developed, nourished and groomed; neck is supple  CARDIOVASCULAR: Examination of carotid arteries is normal; no carotid bruits Regular rate and rhythm, no murmurs Examination of peripheral vascular system by observation and palpation is normal  EYES: Ophthalmoscopic exam of optic discs and posterior segments is normal; no papilledema or hemorrhages No results found.  MUSCULOSKELETAL: Gait, strength, tone, movements noted in Neurologic exam below  NEUROLOGIC: MENTAL STATUS:  No flowsheet data found. awake, alert, oriented to person, place and time recent and remote memory intact normal attention and concentration language fluent, comprehension intact, naming intact fund of knowledge appropriate  CRANIAL NERVE:  2nd - no papilledema on fundoscopic exam 2nd, 3rd, 4th, 6th - pupils equal and reactive to light, visual fields full to confrontation, extraocular muscles intact, no nystagmus 5th - facial sensation symmetric 7th - facial strength  symmetric 8th - hearing intact 9th - palate elevates symmetrically, uvula midline 11th - shoulder shrug symmetric 12th - tongue protrusion midline  MOTOR:  normal bulk and tone DIFFUSE WEAKNESS (3-4/5) in the BUE, BLE  SENSORY:  normal and symmetric to light touch, temperature, vibration  COORDINATION:  finger-nose-finger, fine finger movements normal  REFLEXES:  deep tendon reflexes TRACE and symmetric  GAIT/STATION:  narrow based gait; ANTALGIC SLOW GAIT     DIAGNOSTIC DATA (LABS, IMAGING, TESTING) - I reviewed patient records, labs, notes, testing and imaging myself where available.  Lab Results  Component Value Date   WBC 4.3 09/17/2021   HGB 14.1 09/17/2021  HCT 42.0 09/17/2021   MCV 83.0 09/17/2021   PLT 192.0 09/17/2021      Component Value Date/Time   NA 137 09/17/2021 1537   NA 140 04/01/2021 1509   K 4.0 09/17/2021 1537   CL 101 09/17/2021 1537   CO2 28 09/17/2021 1537   GLUCOSE 97 09/17/2021 1537   BUN 13 09/17/2021 1537   BUN 8 04/01/2021 1509   CREATININE 0.98 09/17/2021 1537   CREATININE 0.94 06/04/2014 1734   CALCIUM 9.7 09/17/2021 1537   PROT 7.7 09/23/2021 1510   ALBUMIN 4.9 09/23/2021 1510   AST 23 09/23/2021 1510   ALT 23 09/23/2021 1510   ALKPHOS 66 09/23/2021 1510   BILITOT 0.9 09/23/2021 1510   GFRNONAA >60 04/09/2018 1242   GFRAA >60 04/09/2018 1242   Lab Results  Component Value Date   CHOL 87 (L) 09/23/2021   HDL 33 (L) 09/23/2021   LDLCALC 38 09/23/2021   LDLDIRECT 64.0 09/30/2016   TRIG 75 09/23/2021   CHOLHDL 2.6 09/23/2021   Lab Results  Component Value Date   HGBA1C 6.4 09/17/2021   Lab Results  Component Value Date   VITAMINB12 776 09/17/2021   Lab Results  Component Value Date   TSH 0.82 09/17/2021    03/27/2020 MRI cervical spine -At C5-6 right posterior lateral disc protrusion with moderate right foraminal stenosis  10/05/2020 MRI lumbar spine -At L4-5 moderate right and mild left foraminal  stenosis -Mild degenerative changes at L3-4 and L5-S1.    ASSESSMENT AND PLAN  55 y.o. year old male here with generalized fatigue, numbness, weakness, pain, memory loss since April 2021.  We will check additional testing.   Dx:  1. Weakness   2. Numbness   3. Other chronic pain       PLAN:  FATIGUE, WEAKNESS, NUMBNESS, PAIN, MEMORY LOSS - unclear etiology; check MRI brain (rule out demyelinating dz), EMG/NCS, lab testing  Orders Placed This Encounter  Procedures   MR BRAIN W WO CONTRAST   AChR Abs with Reflex to MuSK   CK   Aldolase   Angiotensin converting enzyme   Multiple Myeloma Panel (SPEP&IFE w/QIG)   NCV with EMG(electromyography)   Return for for NCV/EMG.    Penni Bombard, MD 71/16/5790, 38:33 AM Certified in Neurology, Neurophysiology and Neuroimaging  [facility] [address] [place], [place] [address] [phone]

## 2021-11-27 NOTE — Telephone Encounter (Signed)
Pt states he was told that he could have his NCV/EMG done before the end of the year. We are currently booking those out to February/March. Pt did not wish to schedule at this time since we could not get him in before January, he states he will not have insurance anymore after this year.

## 2021-11-27 NOTE — Telephone Encounter (Signed)
Pt not having the CPAP titration study done until 01/05/22  [facility] will be able to pull this information from Epic after pt completes the study

## 2021-11-27 NOTE — Telephone Encounter (Signed)
Noted  

## 2021-11-28 LAB — HM DIABETES EYE EXAM

## 2021-12-04 ENCOUNTER — Other Ambulatory Visit: Payer: Self-pay

## 2021-12-04 ENCOUNTER — Encounter (INDEPENDENT_AMBULATORY_CARE_PROVIDER_SITE_OTHER): Payer: BC Managed Care – PPO | Admitting: Neurology

## 2021-12-04 ENCOUNTER — Ambulatory Visit (INDEPENDENT_AMBULATORY_CARE_PROVIDER_SITE_OTHER): Payer: BC Managed Care – PPO | Admitting: Neurology

## 2021-12-04 DIAGNOSIS — Z0289 Encounter for other administrative examinations: Secondary | ICD-10-CM

## 2021-12-04 DIAGNOSIS — G8929 Other chronic pain: Secondary | ICD-10-CM

## 2021-12-04 DIAGNOSIS — R531 Weakness: Secondary | ICD-10-CM | POA: Diagnosis not present

## 2021-12-04 DIAGNOSIS — R200 Anesthesia of skin: Secondary | ICD-10-CM

## 2021-12-04 NOTE — Progress Notes (Signed)
Full Name: Jakobee Brackins Gender: Male MRN #: [id number] Date of Birth: 1966-02-09    Visit Date: 12/04/2021 13:15 Age: 55 Years Examining Physician: Despina Arias, MD  Referring Physician: Joycelyn Schmid, MD Height: 6 feet 1 inch Patient History: 205lbs    History: Mr. Swopes is a 55 year old man with generalized weakness, tingling and fatigue.  Muscle bulk and tone was normal.  Strength appeared to be proportional to effort.  Deep tendon reflexes were 1 in the arms and knees trace at the ankles  Nerve conduction studies: The right median, ulnar, peroneal and tibial motor responses had normal distal latencies, amplitudes and conduction velocities.  F-wave latencies were normal for height.  The right median, ulnar, sural and superficial peroneal sensory responses had normal peak latencies and amplitudes.  Electromyography: Needle EMG of selected muscles of the right arm and leg was performed.  There was mild chronic denervation in the right deltoid and triceps and in the right arm and in the tibialis anterior muscle of the right leg.  Some polyphasic motor units with normal recruitment were noted in the biceps, extensor digitorum communis peroneus longus and gastrocnemius muscles.  There was no abnormal spontaneous activity.  Impression This NCV/EMG study shows the following: No evidence of polyneuropathy or mononeuropathy involving the right arm or leg Mild right C6 or C7 chronic radiculopathy. Possible minimal right L5 chronic radiculopathy   Eli Pattillo A. Epimenio Foot, MD, PhD, FAAN Certified in Neurology, Clinical Neurophysiology, Sleep Medicine, Pain Medicine and Neuroimaging Director, [facility]  [facility] [address], [address] [place], [place] [address] [phone]   Verbal informed consent was obtained from the patient, patient was informed of potential risk of procedure, including bruising, bleeding,  hematoma formation, infection, muscle weakness, muscle pain, numbness, among others.        MNC    Nerve / Sites Muscle Latency Ref. Amplitude Ref. Rel Amp Segments Distance Velocity Ref. Area    ms ms mV mV %  cm m/s m/s mVms  R Median - APB     Wrist APB 3.4 ?4.4 9.8 ?4.0 100 Wrist - APB 7   33.9     Upper arm APB 8.0  9.8  101 Upper arm - Wrist 26 57 ?49 33.5  R Ulnar - ADM     Wrist ADM 3.3 ?3.3 12.9 ?6.0 100 Wrist - ADM 7   22.2     B.Elbow ADM 7.0  10.9  84.4 B.Elbow - Wrist 23 62 ?49 19.9     A.Elbow ADM 8.6  10.4  94.9 A.Elbow - B.Elbow 10 60 ?49 19.0  R Peroneal - EDB     Ankle EDB 5.3 ?6.5 6.3 ?2.0 100 Ankle - EDB 9   21.4     Fib head EDB 12.8  5.1  81.1 Fib head - Ankle 34 45 ?44 20.4     Pop fossa EDB 15.0  4.7  90.7 Pop fossa - Fib head 10 45 ?44 18.4         Pop fossa - Ankle      R Tibial - AH     Ankle AH 4.1 ?5.8 10.0 ?4.0 100 Ankle - AH 9   25.8     Pop fossa AH 14.6  6.2  62.1 Pop fossa - Ankle 44 42 ?41 25.1             SNC    Nerve / Sites Rec. Site  Peak Lat Ref.  Amp Ref. Segments Distance    ms ms V V  cm  R Sural - Ankle (Calf)     Calf Ankle 4.2 ?4.4 12 ?6 Calf - Ankle 14  R Superficial peroneal - Ankle     Lat leg Ankle 3.9 ?4.4 9 ?6 Lat leg - Ankle 14  R Median - Orthodromic (Dig II, Mid palm)     Dig II Wrist 3.1 ?3.4 17 ?10 Dig II - Wrist 14  R Ulnar - Orthodromic, (Dig V, Mid palm)     Dig V Wrist 2.7 ?3.1 7 ?5 Dig V - Wrist 43             F  Wave    Nerve F Lat Ref.   ms ms  R Tibial - AH 62.8 ?56.0  R Ulnar - ADM 29.5 ?32.0         EMG Summary Table    Spontaneous MUAP Recruitment  Muscle IA Fib PSW Fasc Other Amp Dur. Poly Pattern  R. Deltoid Normal None None None _______ Normal Normal Normal Normal  R. Triceps brachii Normal None None None _______ Increased Increased 1+ Reduced  R. Biceps brachii Normal None None None _______ Normal Normal 1+ Reduced  R. Extensor digitorum communis Normal None None None _______ Normal Increased 1+  Reduced  R. Flexor carpi ulnaris Normal None None None _______ Normal Normal Normal Normal  R. First dorsal interosseous Normal None None None _______ Normal Normal Normal Normal  R. Vastus medialis Normal None None None _______ Normal Normal Normal Normal  R. Tibialis anterior Normal None None None _______ Normal Normal 1+ Reduced  R. Peroneus longus Normal None None None _______ Normal Increased 1+ Normal  R. Gastrocnemius (Medial head) Normal None None None _______ Normal Normal 1+ Normal  R. Abductor hallucis Normal None None None _______ Normal Normal Normal Normal  R. Gluteus medius Normal None None None _______ Normal Normal Normal Normal

## 2021-12-07 LAB — MULTIPLE MYELOMA PANEL, SERUM
Albumin SerPl Elph-Mcnc: 3.9 g/dL (ref 2.9–4.4)
Albumin/Glob SerPl: 1.2 (ref 0.7–1.7)
Alpha 1: 0.2 g/dL (ref 0.0–0.4)
Alpha2 Glob SerPl Elph-Mcnc: 0.7 g/dL (ref 0.4–1.0)
B-Globulin SerPl Elph-Mcnc: 1.2 g/dL (ref 0.7–1.3)
Gamma Glob SerPl Elph-Mcnc: 1.2 g/dL (ref 0.4–1.8)
Globulin, Total: 3.3 g/dL (ref 2.2–3.9)
IgA/Immunoglobulin A, Serum: 427 mg/dL — ABNORMAL HIGH (ref 90–386)
IgG (Immunoglobin G), Serum: 1097 mg/dL (ref 603–1613)
IgM (Immunoglobulin M), Srm: 92 mg/dL (ref 20–172)
Total Protein: 7.2 g/dL (ref 6.0–8.5)

## 2021-12-07 LAB — ACHR ABS WITH REFLEX TO MUSK: AChR Binding Ab, Serum: <0.03 nmol/L (ref 0.00–0.24)

## 2021-12-07 LAB — ALDOLASE: Aldolase: 4.4 U/L (ref 3.3–10.3)

## 2021-12-07 LAB — ANGIOTENSIN CONVERTING ENZYME: Angio Convert Enzyme: 48 U/L (ref 14–82)

## 2021-12-07 LAB — CK: Total CK: 155 U/L (ref 41–331)

## 2021-12-07 LAB — MUSK ANTIBODIES: MuSK Antibodies: <1 U/mL

## 2021-12-19 ENCOUNTER — Telehealth: Payer: Self-pay | Admitting: Diagnostic Neuroimaging

## 2021-12-19 NOTE — Telephone Encounter (Signed)
Pt inquiring when Nerve Conduction results will be back. Would like a call from the nurse

## 2021-12-19 NOTE — Telephone Encounter (Signed)
Spoke with patient and informed him the EMG showed no widespread nerve or muscle diseases found. Mild nerve irritation in the neck and low back, already known from prior MRI studies, and recommend conservative management (PT, OT, pain management).  He stated his insurance is in limbo so he'll wait for that to be resolved before he considering PT/OT, and scheduling his MRI. Patient verbalized understanding, appreciation.

## 2021-12-27 ENCOUNTER — Telehealth: Payer: Self-pay | Admitting: Adult Health

## 2021-12-30 NOTE — Telephone Encounter (Signed)
Dr. Sood, please advise on this for pt. 

## 2021-12-31 NOTE — Telephone Encounter (Signed)
Tried calling at listed number but wasn't able to be connected.  He needs to determine how he would pay for a CPAP machine if he doesn't have insurance.  If he is not able to pay for a CPAP machine without insurance, then there is no point in doing the CPAP titration study on 01/05/22.  If he thinks he will be getting insurance again soon, then he should wait until he gets insurance and then arrange for the sleep study to be rescheduled.  If he is planning to pay for a CPAP machine on his own without insurance, then he doesn't need any additional sleep testing.  He can order a CPAP machine online and I can write a prescription for which ever device he chooses to purchase.

## 2022-01-01 NOTE — Telephone Encounter (Signed)
Called and spoke with patient to ask him about upcoming sleep study and how he is going to pay for CPAP. He states that right now he does not have insurance but has applied for some. He asked to push back to sleep study about a month while he is working on his insurance. Will forward to the [facility] to get this rescheduled for about a month for patient. Please call him with new information.

## 2022-01-01 NOTE — Telephone Encounter (Signed)
Pt to call the sleep center to reschedule his titration.  Nothing further needed at this time

## 2022-01-05 ENCOUNTER — Encounter (HOSPITAL_BASED_OUTPATIENT_CLINIC_OR_DEPARTMENT_OTHER): Payer: BC Managed Care – PPO | Admitting: Pulmonary Disease

## 2022-01-21 ENCOUNTER — Telehealth: Payer: Self-pay | Admitting: Internal Medicine

## 2022-01-21 ENCOUNTER — Other Ambulatory Visit: Payer: Self-pay

## 2022-01-21 MED ORDER — JANUVIA 100 MG PO TABS: 100.0000 mg | ORAL_TABLET | Freq: Every day | ORAL | 0 refills | Status: DC

## 2022-01-21 NOTE — Telephone Encounter (Signed)
Sent in today 

## 2022-01-21 NOTE — Telephone Encounter (Signed)
1.Medication Requested: JANUVIA 100 MG tablet  2. Pharmacy ([place], [place], [place]): [facility] [address] [place]  Phone:  [phone] Fax:  [phone]   3. On Med List: yes  4. Last Visit with PCP: 10.11.22  5. Next visit date with PCP: 04.17.23  **Patient is running low**   Agent: Please be advised that RX refills may take up to 3 business days. We ask that you follow-up with your pharmacy.

## 2022-01-27 ENCOUNTER — Telehealth: Payer: Self-pay | Admitting: Pulmonary Disease

## 2022-01-29 NOTE — Telephone Encounter (Signed)
Working on PA

## 2022-01-30 ENCOUNTER — Telehealth: Payer: Self-pay | Admitting: Internal Medicine

## 2022-01-30 DIAGNOSIS — M5416 Radiculopathy, lumbar region: Secondary | ICD-10-CM | POA: Diagnosis not present

## 2022-01-30 NOTE — Telephone Encounter (Signed)
Pt requesting an order for a UA, pt states he has severe lower back pain x2w, pts next ov is 2-28

## 2022-01-31 NOTE — Telephone Encounter (Signed)
Patient will wait until his appointment on Tuesday

## 2022-01-31 NOTE — Telephone Encounter (Signed)
Done.  Pt aware.  Nothing further needed at this time.

## 2022-02-02 ENCOUNTER — Ambulatory Visit (HOSPITAL_BASED_OUTPATIENT_CLINIC_OR_DEPARTMENT_OTHER): Payer: BC Managed Care – PPO | Admitting: Pulmonary Disease

## 2022-02-03 ENCOUNTER — Other Ambulatory Visit: Payer: Self-pay

## 2022-02-03 ENCOUNTER — Ambulatory Visit (HOSPITAL_BASED_OUTPATIENT_CLINIC_OR_DEPARTMENT_OTHER): Payer: BC Managed Care – PPO | Attending: Adult Health | Admitting: Pulmonary Disease

## 2022-02-03 DIAGNOSIS — G4733 Obstructive sleep apnea (adult) (pediatric): Secondary | ICD-10-CM | POA: Diagnosis not present

## 2022-02-04 ENCOUNTER — Ambulatory Visit (INDEPENDENT_AMBULATORY_CARE_PROVIDER_SITE_OTHER): Payer: BC Managed Care – PPO | Admitting: Internal Medicine

## 2022-02-04 ENCOUNTER — Encounter: Payer: Self-pay | Admitting: Internal Medicine

## 2022-02-04 VITALS — BP 128/76 | HR 76 | Temp 98.3°F | Ht 73.0 in | Wt 207.1 lb

## 2022-02-04 DIAGNOSIS — K219 Gastro-esophageal reflux disease without esophagitis: Secondary | ICD-10-CM

## 2022-02-04 DIAGNOSIS — E782 Mixed hyperlipidemia: Secondary | ICD-10-CM

## 2022-02-04 DIAGNOSIS — M5442 Lumbago with sciatica, left side: Secondary | ICD-10-CM | POA: Diagnosis not present

## 2022-02-04 DIAGNOSIS — E119 Type 2 diabetes mellitus without complications: Secondary | ICD-10-CM

## 2022-02-04 DIAGNOSIS — R3989 Other symptoms and signs involving the genitourinary system: Secondary | ICD-10-CM | POA: Insufficient documentation

## 2022-02-04 DIAGNOSIS — G8929 Other chronic pain: Secondary | ICD-10-CM

## 2022-02-04 DIAGNOSIS — M5441 Lumbago with sciatica, right side: Secondary | ICD-10-CM

## 2022-02-04 LAB — CBC WITH DIFFERENTIAL/PLATELET
Basophils Absolute: 0 10*3/uL (ref 0.0–0.1)
Basophils Relative: 0.6 % (ref 0.0–3.0)
Eosinophils Absolute: 0.1 10*3/uL (ref 0.0–0.7)
Eosinophils Relative: 2.2 % (ref 0.0–5.0)
HCT: 43.1 % (ref 39.0–52.0)
Hemoglobin: 14.8 g/dL (ref 13.0–17.0)
Lymphocytes Relative: 56.2 % — ABNORMAL HIGH (ref 12.0–46.0)
Lymphs Abs: 3.1 10*3/uL (ref 0.7–4.0)
MCHC: 34.3 g/dL (ref 30.0–36.0)
MCV: 83.5 fl (ref 78.0–100.0)
Monocytes Absolute: 0.4 10*3/uL (ref 0.1–1.0)
Monocytes Relative: 7.6 % (ref 3.0–12.0)
Neutro Abs: 1.9 10*3/uL (ref 1.4–7.7)
Neutrophils Relative %: 33.4 % — ABNORMAL LOW (ref 43.0–77.0)
Platelets: 211 10*3/uL (ref 150.0–400.0)
RBC: 5.16 Mil/uL (ref 4.22–5.81)
RDW: 13.2 % (ref 11.5–15.5)
WBC: 5.6 10*3/uL (ref 4.0–10.5)

## 2022-02-04 LAB — COMPREHENSIVE METABOLIC PANEL
ALT: 22 U/L (ref 0–53)
AST: 19 U/L (ref 0–37)
Albumin: 4.7 g/dL (ref 3.5–5.2)
Alkaline Phosphatase: 56 U/L (ref 39–117)
BUN: 13 mg/dL (ref 6–23)
CO2: 29 mEq/L (ref 19–32)
Calcium: 10.2 mg/dL (ref 8.4–10.5)
Chloride: 100 mEq/L (ref 96–112)
Creatinine, Ser: 1.08 mg/dL (ref 0.40–1.50)
GFR: 77.99 mL/min (ref >60.00)
Glucose, Bld: 109 mg/dL — ABNORMAL HIGH (ref 70–99)
Potassium: 4.8 mEq/L (ref 3.5–5.1)
Sodium: 136 mEq/L (ref 135–145)
Total Bilirubin: 1 mg/dL (ref 0.2–1.2)
Total Protein: 8 g/dL (ref 6.0–8.3)

## 2022-02-04 LAB — URINALYSIS, ROUTINE W REFLEX MICROSCOPIC
Bilirubin Urine: NEGATIVE
Ketones, ur: NEGATIVE
Leukocytes,Ua: NEGATIVE
Nitrite: NEGATIVE
Specific Gravity, Urine: 1.02 (ref 1.000–1.030)
Total Protein, Urine: NEGATIVE
Urine Glucose: NEGATIVE
Urobilinogen, UA: 1 (ref 0.0–1.0)
pH: 6 (ref 5.0–8.0)

## 2022-02-04 LAB — LIPID PANEL
Cholesterol: 86 mg/dL (ref 0–200)
HDL: 32.3 mg/dL — ABNORMAL LOW (ref >39.00)
LDL Cholesterol: 34 mg/dL (ref 0–99)
NonHDL: 53.5
Total CHOL/HDL Ratio: 3
Triglycerides: 99 mg/dL (ref 0.0–149.0)
VLDL: 19.8 mg/dL (ref 0.0–40.0)

## 2022-02-04 LAB — HEMOGLOBIN A1C: Hgb A1c MFr Bld: 6.4 % (ref 4.6–6.5)

## 2022-02-04 NOTE — Assessment & Plan Note (Addendum)
Chronic Check a1c, CMP Continue januvia 100 mg daily

## 2022-02-04 NOTE — Assessment & Plan Note (Addendum)
Acute Ua, ucx CMP

## 2022-02-04 NOTE — Assessment & Plan Note (Addendum)
Chronic Check lipid panel  Continue crestor 5 mg daily

## 2022-02-04 NOTE — Assessment & Plan Note (Signed)
Chronic GERD controlled Continue pantoprazole 40 mg daily 

## 2022-02-04 NOTE — Assessment & Plan Note (Addendum)
Acute on chronic lower back pain Pain radiates to both hips Pain is 10/10.  He recently saw orthopedics and they recommend an epidural injection, for which she is scheduled with Dr. Horald Chestnut on 02/21/2022 He is taking tramadol for the pain I doubt his back pain is from his kidneys, UTI or any of his chronic medical issues-I do think his worsening of his back pain is acute on chronic back pain Will check CMP, UA, urine culture and CBC

## 2022-02-04 NOTE — Patient Instructions (Addendum)
° ° ° °  Give a urine sample downstairs.  Give blood work downstairs.    Medications changes include :   none   Your prescription(s) have been sent to your pharmacy.     Return in about 6 months (around 08/04/2022) for follow up.

## 2022-02-04 NOTE — Progress Notes (Signed)
Subjective:    Patient ID: [name], male    DOB: 12-21-1965, 56 y.o.   MRN: [id number]  This visit occurred during the SARS-CoV-2 public health emergency.  Safety protocols were in place, including screening questions prior to the visit, additional usage of staff PPE, and extensive cleaning of exam room while observing appropriate contact time as indicated for disinfecting solutions.    HPI The patient is here for an acute visit.   Lower back pain x 2 weeks -acute on chronic lower back pain-is not sure why it is worse.  He did recently see orthopedics and he was concerned that maybe there was some relation to his kidneys.  Orthopedics advised to follow-up here.  He states some abnormal color to his urine at times, but denies any pain or blood in the urine.  He is taking his medication as prescribed.  He was wondering if he could also get his routine blood work done.   He has a history of lower back pain and follows with orthopedics.  The pain radiates down to both hips.  He is also seen neurology-Dr. Marjory Lies.  He takes gabapentin, tramadol and ibuprofen 800 mg.  He last saw orthopedics 01/30/2022.  At that time his back pain had worsened over the past week.Orthopedics noted normal strength and negative straight leg raise.  They recommended an intralaminar lumbar epidural injection.  He did not need an MRI because he just had 1 in October 2022.  He is scheduled for the injection 02/11/2022.  He is wearing a back brace.  His BMs are normal - he takes miralax.   Medications and allergies reviewed with patient and updated if appropriate.  Patient Active Problem List   Diagnosis Date Noted   Hyperlipidemia 09/17/2021   Dry mouth 09/17/2021   B12 deficiency 04/15/2021   Post-COVID chronic dyspnea 01/10/2021   Generalized abdominal pain 02/28/2020   Abdominal distention 02/28/2020   Chronic constipation 10/10/2019   Chronic low back pain 04/15/2019   Chronic insomnia 03/24/2019    Abdominal wall hernia 10/06/2018   Arthralgia 06/01/2017   Other chest pain 06/01/2017   Fatigue 06/01/2017   Diabetes (HCC) 10/01/2016   Meralgia paresthetica of right side 01/25/2016   Vitamin D deficiency 01/25/2016   SBO (small bowel obstruction) (HCC) 01/02/2016   Allergic rhinitis 02/07/2011   OBSTRUCTIVE SLEEP APNEA, moderate 10/23/2010   GERD 03/12/2010   Irritable bowel syndrome 03/12/2010   DIARRHEA, RECURRENT 02/05/2010    Current Outpatient Medications on File Prior to Visit  Medication Sig Dispense Refill   cetirizine (ZYRTEC) 10 MG tablet Take 10 mg by mouth daily as needed for allergies.     famotidine (PEPCID) 40 MG tablet Take 1 tablet (40 mg total) by mouth at bedtime. 90 tablet 1   gabapentin (NEURONTIN) 300 MG capsule Take 1-2 capsules by mouth at bedtime.     ibuprofen (ADVIL) 800 MG tablet Take 800 mg by mouth 3 (three) times daily.     JANUVIA 100 MG tablet Take 1 tablet (100 mg total) by mouth daily. 90 tablet 0   Lactobacillus (PROBIOTIC ACIDOPHILUS) CAPS Taking daily     pantoprazole (PROTONIX) 40 MG tablet TAKE 1 TABLET BY MOUTH EVERY DAY 90 tablet 1   rosuvastatin (CRESTOR) 5 MG tablet Take 1 tablet (5 mg total) by mouth daily. 90 tablet 3   sildenafil (REVATIO) 20 MG tablet SMARTSIG:3-5 Tablet(s) By Mouth PRN     traMADol (ULTRAM) 50 MG tablet tramadol 50 mg  tablet  Take 1 tablet 3 times a day by oral route as needed.     vitamin C (ASCORBIC ACID) 500 MG tablet Take 500 mg by mouth daily.     No current facility-administered medications on file prior to visit.    Past Medical History:  Diagnosis Date   Allergy    Chest wall pain    Cough    Diabetes (HCC)    Diarrhea recurrent   GERD (gastroesophageal reflux disease)    Headache(784.0)    Irritable bowel syndrome    Obstructive sleep apnea    apnealink 10/23/10 AHI 5, Sp02 low 87%   Postural lightheadedness    SBO (small bowel obstruction) ([place]) 12/2015   Sleep apnea    11/27/21 will get  new test   Small bowel obstruction [facility])     Past Surgical History:  Procedure Laterality Date   CHOLECYSTECTOMY  2009   COLECTOMY  1994   segmental   COLONOSCOPY  07/17/2011   hemorrhoids, otherwise normal including terminal ileum and random colon biopises   GASTROSTOMY  2005   stamm   LAPAROTOMY  2002, 2005, 2009   SPHINCTEROTOMY  2009   lateral internal   UPPER GASTROINTESTINAL ENDOSCOPY  07/17/2011   normal, including duodenal biopsies    Social History   Socioeconomic History   Marital status: Married    Spouse name: Not on file   Number of children: 4   Years of education: Not on file   Highest education level: Associate degree: occupational, Hotel manager, or vocational program  Occupational History   Occupation: FORK LIFT DRIVER    Employer: [employer]: 11/27/21 out of work  Tobacco Use   Smoking status: Never   Smokeless tobacco: Never  Vaping Use   Vaping Use: Never used  Substance and Sexual Activity   Alcohol use: Never   Drug use: Never   Sexual activity: Not on file  Other Topics Concern   Not on file  Social History Narrative   Occupation: Group Lead Tax adviser (deliveries)   Patient has never smoked.   Alcohol Use-no   Illicit Drug Use-no   Married-Divorced-Married again 2009-    3 children 2 sons and 1 daughter   [place] heritage   No regular exercise   Social Determinants of Health   Financial Resource Strain: Not on file  Food Insecurity: Not on file  Transportation Needs: Not on file  Physical Activity: Not on file  Stress: Not on file  Social Connections: Not on file    Family History  Problem Relation Age of Onset   Hypertension Mother    Liver disease Father        liver cancer???   Colon cancer Neg Hx    Esophageal cancer Neg Hx    Rectal cancer Neg Hx    Stomach cancer Neg Hx     Review of Systems  Constitutional:  Negative for fever.  Gastrointestinal:  Positive for constipation (controlled  with miralax). Negative for diarrhea.  Genitourinary:  Positive for difficulty urinating (occ) and frequency (chronic). Negative for dysuria and hematuria.       Color of urine is a little different  Musculoskeletal:  Positive for back pain.      Objective:   Vitals:   02/04/22 1343  BP: 128/76  Pulse: 76  Temp: 98.3 F (36.8 C)  SpO2: 93%   BP Readings from Last 3 Encounters:  02/04/22 128/76  11/27/21 128/67  11/25/21 110/80  Wt Readings from Last 3 Encounters:  02/04/22 207 lb 2 oz (94 kg)  02/03/22 200 lb (90.7 kg)  11/27/21 205 lb (93 kg)   Body mass index is 27.33 kg/m.   Physical Exam Constitutional:      General: He is not in acute distress.    Appearance: Normal appearance. He is not ill-appearing.  HENT:     Head: Normocephalic and atraumatic.  Eyes:     Conjunctiva/sclera: Conjunctivae normal.  Cardiovascular:     Rate and Rhythm: Normal rate and regular rhythm.     Heart sounds: Normal heart sounds. No murmur heard. Pulmonary:     Effort: Pulmonary effort is normal. No respiratory distress.     Breath sounds: Normal breath sounds. No wheezing or rales.  Musculoskeletal:     Right lower leg: No edema.     Left lower leg: No edema.  Skin:    General: Skin is warm and dry.     Findings: No rash.  Neurological:     Mental Status: He is alert. Mental status is at baseline.  Psychiatric:        Mood and Affect: Mood normal.           Assessment & Plan:    See Problem List for Assessment and Plan of chronic medical problems.

## 2022-02-05 LAB — URINE CULTURE: Result:: NO GROWTH

## 2022-02-06 ENCOUNTER — Telehealth: Payer: Self-pay

## 2022-02-06 NOTE — Telephone Encounter (Signed)
Pt is requesting information about the recent lab work. I advised to the pt that Dr. [name] hasn't written a result note. ? ?Pt is requesting a CB [phone] ?

## 2022-02-07 ENCOUNTER — Telehealth: Payer: Self-pay | Admitting: Pulmonary Disease

## 2022-02-07 ENCOUNTER — Telehealth: Payer: Self-pay | Admitting: Adult Health

## 2022-02-07 DIAGNOSIS — G4733 Obstructive sleep apnea (adult) (pediatric): Secondary | ICD-10-CM | POA: Diagnosis not present

## 2022-02-07 NOTE — Telephone Encounter (Signed)
Seen by patient [name] on 02/06/2022  ? 5:56 PM ? ?Results viewed via my-chart ?

## 2022-02-07 NOTE — Telephone Encounter (Signed)
ATC patient.  LMTCB. 

## 2022-02-07 NOTE — Telephone Encounter (Signed)
Patient is returning phone call. Patient phone number is [phone]. ?

## 2022-02-07 NOTE — Telephone Encounter (Signed)
CPAP titration study done on February 03, 2022 showed optimal CPAP control at 7 cm H2O.  Did not require oxygen. ? ?Please set up an office visit or video visit to discuss results and treatment options ?

## 2022-02-07 NOTE — Telephone Encounter (Signed)
I called the patient and he is agreeable to the CPAP. Order has been placed. Nothing further needed. ?

## 2022-02-07 NOTE — Telephone Encounter (Signed)
Called patient and went over results with him. He voiced understanding. He states that once order is placed for cpap and he receives a call from dme that he will see how much the machine is going to cost him and go from there. He states he will call back and schedule an appt after he starts using machine was unable to look at calendar during phone call. Nothing further needed.  ?

## 2022-02-07 NOTE — Procedures (Signed)
° ° ° °  Patient Name: [name], [name] Date: 02/03/2022 Gender: Male D.O.B: Dec 16, 1965 Age (years): 5 Referring Provider: Tammy Parrett Height (inches): 73 Interpreting Physician: Coralyn Helling MD, ABSM Weight (lbs): 200 RPSGT: Armen Pickup BMI: 26 MRN: [id number] Neck Size: 15.00  CLINICAL INFORMATION The patient is referred for a CPAP titration to treat sleep apnea.  Date of HST 06/29/18:  AHI 18.1, SpO2 low 87%.  SLEEP STUDY TECHNIQUE As per the AASM Manual for the Scoring of Sleep and Associated Events v2.3 (April 2016) with a hypopnea requiring 4% desaturations.  The channels recorded and monitored were frontal, central and occipital EEG, electrooculogram (EOG), submentalis EMG (chin), nasal and oral airflow, thoracic and abdominal wall motion, anterior tibialis EMG, snore microphone, electrocardiogram, and pulse oximetry. Continuous positive airway pressure (CPAP) was initiated at the beginning of the study and titrated to treat sleep-disordered breathing.  MEDICATIONS Medications self-administered by patient taken the night of the study : N/A  TECHNICIAN COMMENTS Comments added by technician: No restroom visted. Patient had difficulty initiating sleep. Comments added by scorer: N/A  RESPIRATORY PARAMETERS Optimal PAP Pressure (cm): 7 AHI at Optimal Pressure (/hr): 0.5 Overall Minimal O2 (%): 90.0 Supine % at Optimal Pressure (%): 97 Minimal O2 at Optimal Pressure (%): 90.0   SLEEP ARCHITECTURE The study was initiated at 10:16:00 PM and ended at 4:20:21 AM.  Sleep onset time was 36.2 minutes and the sleep efficiency was 80.0%%. The total sleep time was 291.5 minutes.  The patient spent 2.2%% of the night in stage N1 sleep, 73.9%% in stage N2 sleep, 2.1%% in stage N3 and 21.8% in REM.Stage REM latency was 97.0 minutes  Wake after sleep onset was 36.7. Alpha intrusion was absent. Supine sleep was 66.72%.  CARDIAC DATA The 2 lead EKG demonstrated sinus rhythm.  The mean heart rate was 61.7 beats per minute. Other EKG findings include: None.  LEG MOVEMENT DATA The total Periodic Limb Movements of Sleep (PLMS) were 0. The PLMS index was 0.0. A PLMS index of <15 is considered normal in adults.  IMPRESSIONS - He did well with CPAP at 7 cm H2O. - He did not require the use of supplemental oxygen during this study.  DIAGNOSIS - Obstructive Sleep Apnea (G47.33)  RECOMMENDATIONS - Trial of CPAP therapy on 7 cm H2O with a Medium size Resmed Full Face AirFit F20 mask and heated humidification. - Avoid alcohol, sedatives and other CNS depressants that may worsen sleep apnea and disrupt normal sleep architecture. - Sleep hygiene should be reviewed to assess factors that may improve sleep quality. - Weight management and regular exercise should be initiated or continued.  [Electronically signed] 02/07/2022 08:55 AM  Coralyn Helling MD, ABSM Diplomate, American Board of Sleep Medicine NPI: [id number]  [facility] PH: [phone]   FX: [phone] ACCREDITED BY THE AMERICAN ACADEMY OF SLEEP MEDICINE

## 2022-02-07 NOTE — Telephone Encounter (Signed)
CPAP titration 02/03/22 >> CPAP 7 cm H2O >> AHI 0.5. ? ? ?Please let him know he did well with CPAP at 7 cm H2O.  Please send order to arrange for CPAP at 7 cm H2O with heated humidity and mask of choice.  He needs ROV with me or NP in 4 to 5 months. ?

## 2022-02-11 DIAGNOSIS — M5416 Radiculopathy, lumbar region: Secondary | ICD-10-CM | POA: Diagnosis not present

## 2022-02-24 ENCOUNTER — Ambulatory Visit: Payer: BC Managed Care – PPO | Admitting: Pulmonary Disease

## 2022-02-24 DIAGNOSIS — Z0271 Encounter for disability determination: Secondary | ICD-10-CM

## 2022-02-27 ENCOUNTER — Telehealth: Payer: Self-pay | Admitting: Pulmonary Disease

## 2022-02-27 NOTE — Telephone Encounter (Signed)
Patient called to check on the status of his disability paperwork. Patient states it was coming from [employer]. I have not received any forms for this patient. Spoke to Ace at [employer] and they will be faxing new forms to [phone]. ? ? ?

## 2022-02-28 ENCOUNTER — Telehealth: Payer: Self-pay

## 2022-02-28 NOTE — Telephone Encounter (Signed)
I thought he was taking pantoprazole 40 mg daily? ? ? ?

## 2022-02-28 NOTE — Telephone Encounter (Signed)
Pt is requesting another GERD medication. Pt insurance isn't covering ?Famotidine any more. ? ?Pharmacy: ?[id number] - [place], [place] - [address] ? ?LOV 02/04/22 ?ROV 03/24/22 ? ? ? ?

## 2022-03-03 NOTE — Telephone Encounter (Signed)
Pt states that insurance is still not covered. He is requesting a call back. ?

## 2022-03-03 NOTE — Telephone Encounter (Signed)
[name] [name] - Rx #: [id number] Need help? Call us at [phone] ?Outcome ?Approvedtoday ?Effective from 03/03/2022 through 03/02/2023. ?Drug ?Pantoprazole Sodium 40MG  dr tablets ?Form ?[employer] Electronic Request Form (CB) ?Original Claim Info ?75 DRUG REQUIRES PRIOR AUTHORIZATION ?

## 2022-03-03 NOTE — Telephone Encounter (Signed)
Spoke with patient today. 

## 2022-03-04 NOTE — Telephone Encounter (Signed)
Spoke with patient today. ? ?Called pharmacy and script was ready for pick up. ? ?Patient informed to pick up script. ?

## 2022-03-06 NOTE — Telephone Encounter (Signed)
I still have not received any forms for this patient. Spoke to [facility] at [place] and they will be faxing new forms to [phone]. States claim number is [id number] and paperwork is just requesting ov notes, testings, future appointmens and estimated return to work date. Will wait for the request paperwork. ?

## 2022-03-20 ENCOUNTER — Other Ambulatory Visit: Payer: Self-pay | Admitting: Internal Medicine

## 2022-03-20 NOTE — Telephone Encounter (Signed)
Spoke with Mathis Fare with [employer] and got forms faxed over requesting sleep test results and last ov notes. Paperwork faxed back to [employer]. Nothing further needed. ?

## 2022-03-24 ENCOUNTER — Encounter: Payer: Self-pay | Admitting: Internal Medicine

## 2022-03-24 ENCOUNTER — Ambulatory Visit: Payer: BC Managed Care – PPO | Admitting: Internal Medicine

## 2022-03-24 NOTE — Progress Notes (Signed)
Outside notes received. Information abstracted. Notes sent to scan.  

## 2022-04-12 ENCOUNTER — Other Ambulatory Visit: Payer: Self-pay | Admitting: Internal Medicine

## 2022-04-29 ENCOUNTER — Other Ambulatory Visit: Payer: Self-pay | Admitting: Cardiology

## 2022-04-29 DIAGNOSIS — I2510 Atherosclerotic heart disease of native coronary artery without angina pectoris: Secondary | ICD-10-CM

## 2022-05-02 ENCOUNTER — Ambulatory Visit (INDEPENDENT_AMBULATORY_CARE_PROVIDER_SITE_OTHER): Payer: BC Managed Care – PPO | Admitting: Internal Medicine

## 2022-05-02 VITALS — BP 116/64 | HR 79 | Temp 98.0°F | Ht 73.0 in | Wt 209.0 lb

## 2022-05-02 DIAGNOSIS — R3915 Urgency of urination: Secondary | ICD-10-CM | POA: Diagnosis not present

## 2022-05-02 DIAGNOSIS — R3989 Other symptoms and signs involving the genitourinary system: Secondary | ICD-10-CM

## 2022-05-02 DIAGNOSIS — G4733 Obstructive sleep apnea (adult) (pediatric): Secondary | ICD-10-CM | POA: Diagnosis not present

## 2022-05-02 DIAGNOSIS — K219 Gastro-esophageal reflux disease without esophagitis: Secondary | ICD-10-CM | POA: Diagnosis not present

## 2022-05-02 DIAGNOSIS — E119 Type 2 diabetes mellitus without complications: Secondary | ICD-10-CM | POA: Diagnosis not present

## 2022-05-02 MED ORDER — FAMOTIDINE 40 MG PO TABS: 40.0000 mg | ORAL_TABLET | Freq: Every day | ORAL | 3 refills | Status: DC

## 2022-05-02 MED ORDER — DOXYCYCLINE HYCLATE 100 MG PO TABS: 100.0000 mg | ORAL_TABLET | Freq: Two times a day (BID) | ORAL | 0 refills | Status: DC

## 2022-05-02 NOTE — Patient Instructions (Signed)
Please take all new medication as prescribed - the antibiotic, and the pepcid at bedtime  Please continue all other medications as before, and refills have been done if requested.  Please have the pharmacy call with any other refills you may need.  Please continue your efforts at being more active, low cholesterol diabetic diet, and weight control.  Please keep your appointments with your specialists as you may have planned  Please go to the LAB at the blood drawing area for the tests to be done  You will be contacted by phone if any changes need to be made immediately.  Otherwise, you will receive a letter about your results with an explanation, but please check with MyChart first.  Please remember to sign up for MyChart if you have not done so, as this will be important to you in the future with finding out test results, communicating by private email, and scheduling acute appointments online when needed.

## 2022-05-02 NOTE — Progress Notes (Unsigned)
Patient ID: [name], male   DOB: 03-24-1966, 56 y.o.   MRN: [id number]        Chief Complaint: follow up weakness and urinary symptoms       HPI:  [name] is a 56 y.o. male with hx of DM and OSA (moderate not treated per pt preference) here with c/o 4-5 days onset unusual general weakness, feeling poorly associated with onset urinary symptoms of new urgency, nocturia and start/stop with urination where he cant seem to just urinate with one stream.  Denies fever, chills, pelvic or rectal or abd pain and no prior hx of same.  Denies urinary symptoms such as dysuria, flank pain, hematuria.  Pt denies chest pain, increased sob or doe, wheezing, orthopnea, PND, increased LE swelling, palpitations, dizziness or syncope, but does have uncontrolled GERD as well for several months somewhat better with TUMS in addition to his PPI daily use.  Has tried to adjust diet but still occurs, worse to lie down at night.   Pt denies polydipsia, polyuria, or new focal neuro s/s.    Pt denies other recent wt loss, night sweats, loss of appetite, or other constitutional symptoms, in fact has gained several lbs  Pt specifically requests A1c today       Wt Readings from Last 3 Encounters:  05/02/22 209 lb (94.8 kg)  02/04/22 207 lb 2 oz (94 kg)  02/03/22 200 lb (90.7 kg)   BP Readings from Last 3 Encounters:  05/02/22 116/64  02/04/22 128/76  11/27/21 128/67         Past Medical History:  Diagnosis Date   Allergy    Chest wall pain    Cough    Diabetes (HCC)    Diarrhea recurrent   GERD (gastroesophageal reflux disease)    Headache(784.0)    Irritable bowel syndrome    Obstructive sleep apnea    apnealink 10/23/10 AHI 5, Sp02 low 87%   Postural lightheadedness    SBO (small bowel obstruction) ([place]) 12/2015   Sleep apnea    11/27/21 will get new test   Small bowel obstruction [facility])    Past Surgical History:  Procedure Laterality Date   CHOLECYSTECTOMY  2009   COLECTOMY  1994   segmental    COLONOSCOPY  07/17/2011   hemorrhoids, otherwise normal including terminal ileum and random colon biopises   GASTROSTOMY  2005   stamm   LAPAROTOMY  2002, 2005, 2009   SPHINCTEROTOMY  2009   lateral internal   UPPER GASTROINTESTINAL ENDOSCOPY  07/17/2011   normal, including duodenal biopsies    reports that he has never smoked. He has never used smokeless tobacco. He reports that he does not drink alcohol and does not use drugs. family history includes Hypertension in his mother; Liver disease in his father. Allergies  Allergen Reactions   Metformin And Related     diarrhea   Current Outpatient Medications on File Prior to Visit  Medication Sig Dispense Refill   cetirizine (ZYRTEC) 10 MG tablet Take 10 mg by mouth daily as needed for allergies.     gabapentin (NEURONTIN) 300 MG capsule Take 1-2 capsules by mouth at bedtime.     ibuprofen (ADVIL) 800 MG tablet Take 800 mg by mouth 3 (three) times daily.     JANUVIA 100 MG tablet Take 1 tablet (100 mg total) by mouth daily. 90 tablet 0   Lactobacillus (PROBIOTIC ACIDOPHILUS) CAPS Taking daily     pantoprazole (PROTONIX) 40 MG tablet TAKE  1 TABLET BY MOUTH EVERY DAY 90 tablet 1   rosuvastatin (CRESTOR) 5 MG tablet Take 1 tablet (5 mg total) by mouth daily. 90 tablet 1   sildenafil (REVATIO) 20 MG tablet TAKE 3 TO 5 TABLETS BY MOUTH AS NEEDED 5 tablet 5   traMADol (ULTRAM) 50 MG tablet tramadol 50 mg tablet  Take 1 tablet 3 times a day by oral route as needed.     vitamin C (ASCORBIC ACID) 500 MG tablet Take 500 mg by mouth daily.     No current facility-administered medications on file prior to visit.        ROS:  All others reviewed and negative.  Objective        PE:  BP 116/64 (BP Location: Right Arm, Patient Position: Sitting, Cuff Size: Large)   Pulse 79   Temp 98 F (36.7 C) (Oral)   Ht 6\' 1"  (1.854 m)   Wt 209 lb (94.8 kg)   SpO2 97%   BMI 27.57 kg/m                 Constitutional: Pt appears in NAD                HENT: Head: NCAT.                Right Ear: External ear normal.                 Left Ear: External ear normal.                Eyes: . Pupils are equal, round, and reactive to light. Conjunctivae and EOM are normal               Nose: without d/c or deformity               Neck: Neck supple. Gross normal ROM               Cardiovascular: Normal rate and regular rhythm.                 Pulmonary/Chest: Effort normal and breath sounds without rales or wheezing.                Abd:  Soft, NT, ND, + BS, no organomegaly               Neurological: Pt is alert. At baseline orientation, motor grossly intact               Skin: Skin is warm. No rashes, no other new lesions, LE edema - none               Psychiatric: Pt behavior is normal without agitation   Micro: none  Cardiac tracings I have personally interpreted today:  none  Pertinent Radiological findings (summarize): none   Lab Results  Component Value Date   WBC 5.6 05/02/2022   HGB 16.0 05/02/2022   HCT 45.3 05/02/2022   PLT 214 05/02/2022   GLUCOSE 105 (H) 05/02/2022   CHOL 93 05/02/2022   TRIG 125 05/02/2022   HDL 36 (L) 05/02/2022   LDLDIRECT 64.0 09/30/2016   LDLCALC 36 05/02/2022   ALT 30 05/02/2022   AST 24 05/02/2022   NA 136 05/02/2022   K 4.8 05/02/2022   CL 99 05/02/2022   CREATININE 1.04 05/02/2022   BUN 10 05/02/2022   CO2 29 05/02/2022   TSH 0.92 05/02/2022   PSA 0.34 06/04/2014   INR 1.23 12/01/2009  HGBA1C 6.3 (H) 05/02/2022   MICROALBUR 1.7 09/17/2021   Assessment/Plan:  [name] is a 56 y.o. Other or two or more races [6] male with  has a past medical history of Allergy, Chest wall pain, Cough, Diabetes ([place]), Diarrhea (recurrent), GERD (gastroesophageal reflux disease), Headache(784.0), Irritable bowel syndrome, Obstructive sleep apnea, Postural lightheadedness, SBO (small bowel obstruction) ([place]) (12/2015), Sleep apnea, and Small bowel obstruction ([place]).  Diabetes ([place]) Lab Results   Component Value Date   HGBA1C 6.3 (H) 05/02/2022   Stable, pt to continue current medical treatment [place], moderate Urged pt to return to compliance but not clear he is interested  Urinary urgency Exam is c/w high suspicion for acute prostatitis  - for urine testing and doxy course,  to f/u any worsening symptoms or concerns  GERD Uncontrolled symptomtically - for add pepcid 40 qhs  Followup: Return if symptoms worsen or fail to improve.  Cathlean Cower, MD 05/04/2022 1:38 PM [facility]

## 2022-05-04 ENCOUNTER — Encounter: Payer: Self-pay | Admitting: Internal Medicine

## 2022-05-04 DIAGNOSIS — R3915 Urgency of urination: Secondary | ICD-10-CM | POA: Insufficient documentation

## 2022-05-04 LAB — HEPATIC FUNCTION PANEL
AG Ratio: 1.3 (calc) (ref 1.0–2.5)
ALT: 30 U/L (ref 9–46)
AST: 24 U/L (ref 10–35)
Albumin: 4.7 g/dL (ref 3.6–5.1)
Alkaline phosphatase (APISO): 60 U/L (ref 35–144)
Bilirubin, Direct: 0.3 mg/dL — ABNORMAL HIGH (ref 0.0–0.2)
Globulin: 3.5 g/dL (calc) (ref 1.9–3.7)
Indirect Bilirubin: 1 mg/dL (calc) (ref 0.2–1.2)
Total Bilirubin: 1.3 mg/dL — ABNORMAL HIGH (ref 0.2–1.2)
Total Protein: 8.2 g/dL — ABNORMAL HIGH (ref 6.1–8.1)

## 2022-05-04 LAB — CBC WITH DIFFERENTIAL/PLATELET
Absolute Monocytes: 431 cells/uL (ref 200–950)
Basophils Absolute: 39 cells/uL (ref 0–200)
Basophils Relative: 0.7 %
Eosinophils Absolute: 101 cells/uL (ref 15–500)
Eosinophils Relative: 1.8 %
HCT: 45.3 % (ref 38.5–50.0)
Hemoglobin: 16 g/dL (ref 13.2–17.1)
Lymphs Abs: 3242 cells/uL (ref 850–3900)
MCH: 29.6 pg (ref 27.0–33.0)
MCHC: 35.3 g/dL (ref 32.0–36.0)
MCV: 83.9 fL (ref 80.0–100.0)
MPV: 10.7 fL (ref 7.5–12.5)
Monocytes Relative: 7.7 %
Neutro Abs: 1786 cells/uL (ref 1500–7800)
Neutrophils Relative %: 31.9 %
Platelets: 214 10*3/uL (ref 140–400)
RBC: 5.4 10*6/uL (ref 4.20–5.80)
RDW: 13.1 % (ref 11.0–15.0)
Total Lymphocyte: 57.9 %
WBC: 5.6 10*3/uL (ref 3.8–10.8)

## 2022-05-04 LAB — HEMOGLOBIN A1C
Hgb A1c MFr Bld: 6.3 % of total Hgb — ABNORMAL HIGH (ref <5.7)
Mean Plasma Glucose: 134 mg/dL
eAG (mmol/L): 7.4 mmol/L

## 2022-05-04 LAB — URINE CULTURE: Result:: NO GROWTH

## 2022-05-04 LAB — LIPID PANEL
Cholesterol: 93 mg/dL (ref <200)
HDL: 36 mg/dL — ABNORMAL LOW (ref >=40)
LDL Cholesterol (Calc): 36 mg/dL (calc)
Non-HDL Cholesterol (Calc): 57 mg/dL (calc) (ref <130)
Total CHOL/HDL Ratio: 2.6 (calc) (ref <5.0)
Triglycerides: 125 mg/dL (ref <150)

## 2022-05-04 LAB — BASIC METABOLIC PANEL
BUN: 10 mg/dL (ref 7–25)
CO2: 29 mmol/L (ref 20–32)
Calcium: 10.5 mg/dL — ABNORMAL HIGH (ref 8.6–10.3)
Chloride: 99 mmol/L (ref 98–110)
Creat: 1.04 mg/dL (ref 0.70–1.30)
Glucose, Bld: 105 mg/dL — ABNORMAL HIGH (ref 65–99)
Potassium: 4.8 mmol/L (ref 3.5–5.3)
Sodium: 136 mmol/L (ref 135–146)

## 2022-05-04 LAB — TSH: TSH: 0.92 mIU/L (ref 0.40–4.50)

## 2022-05-04 LAB — TIQ-MISC

## 2022-05-04 NOTE — Assessment & Plan Note (Signed)
Urged pt to return to compliance but not clear he is interested

## 2022-05-04 NOTE — Assessment & Plan Note (Signed)
Uncontrolled symptomtically - for add pepcid 40 qhs

## 2022-05-04 NOTE — Assessment & Plan Note (Signed)
Exam is c/w high suspicion for acute prostatitis  - for urine testing and doxy course,  to f/u any worsening symptoms or concerns

## 2022-05-04 NOTE — Assessment & Plan Note (Signed)
Lab Results  Component Value Date   HGBA1C 6.3 (H) 05/02/2022   Stable, pt to continue current medical treatment januvia

## 2022-05-08 ENCOUNTER — Telehealth: Payer: Self-pay | Admitting: Internal Medicine

## 2022-05-08 ENCOUNTER — Ambulatory Visit (INDEPENDENT_AMBULATORY_CARE_PROVIDER_SITE_OTHER): Payer: BC Managed Care – PPO

## 2022-05-08 ENCOUNTER — Encounter: Payer: Self-pay | Admitting: Pulmonary Disease

## 2022-05-08 ENCOUNTER — Ambulatory Visit (INDEPENDENT_AMBULATORY_CARE_PROVIDER_SITE_OTHER): Payer: BC Managed Care – PPO | Admitting: Pulmonary Disease

## 2022-05-08 VITALS — BP 126/80 | HR 76 | Temp 98.3°F | Ht 73.0 in | Wt 212.2 lb

## 2022-05-08 DIAGNOSIS — R0602 Shortness of breath: Secondary | ICD-10-CM

## 2022-05-08 NOTE — Telephone Encounter (Signed)
Patient would like to know results from latest lab tests.

## 2022-05-08 NOTE — Patient Instructions (Signed)
Shortness of breath Atypical chest pain CXR with no infiltrate, effusion or edema Unlikely related to lungs. May be musculoskeletal Recommend upper body stretching Please contact your PCP for further work-up if pain continues  OSA Please set up a checking account if able so that you can pay for your CPAP device Recommend starting CPAP for uncontrolled OSA  Follow-up with Dr. Craige Cotta or NP in one month

## 2022-05-08 NOTE — Progress Notes (Signed)
@Patient  ID: [name], male    DOB: Apr 24, 1966, 56 y.o.   MRN: [id number]  Chief Complaint  Patient presents with   Follow-up    Referring provider: Binnie Rail, MD  HPI: 56 yo male followed by OSA . Hx with Covid 19 11/2020 , with chronic cough - resolved. Hx of DM .   TEST/EVENTS :  HST 06/29/18 >> AHI 18.1, SaO2 low 87%. PFT 02/18/21 >> FEV1 3.91 (92%), FEV1% 81, TLC 7.07 (93%)   05/08/2022 Acute visit 56 year old male followed by Dr. Halford Chessman for OSA. For his OSA he reports he has not received his CPAP from [facility]. They have requested checking account which patient has not set up. He has cash however DME will not accept.  Today, he is concerned about shortness of breath. He reports that in the last week symptoms initially began with left sided chest pain.  Seems to intermittently occur when taking a deep breath. Denies pain with arm movement or pressure on site. Denies wheezing or coughing. Laying down seem to worsen his symptoms.  He has noticed difficulty sleeping and breathing. Last night was the worst episode of shortness of breath that occurred when he awoke overnight.  Allergies  Allergen Reactions   Metformin And Related     diarrhea    Immunization History  Administered Date(s) Administered   Influenza Inj Mdck Quad Pf 08/25/2018, 08/29/2021   Influenza Split 08/10/2017   Influenza Whole 09/07/2010   Influenza,inj,Quad PF,6+ Mos 08/18/2017, 08/29/2019, 08/29/2020   Influenza-Unspecified 08/20/2016   Pneumococcal Polysaccharide-23 12/04/2016   Tdap 04/17/2011   Zoster Recombinat (Shingrix) 10/06/2018    Past Medical History:  Diagnosis Date   Allergy    Chest wall pain    Cough    Diabetes (HCC)    Diarrhea recurrent   GERD (gastroesophageal reflux disease)    Headache(784.0)    Irritable bowel syndrome    Obstructive sleep apnea    apnealink 10/23/10 AHI 5, Sp02 low 87%   Postural lightheadedness    SBO (small bowel obstruction) ([place]) 12/2015    Sleep apnea    11/27/21 will get new test   Small bowel obstruction (HCC)     Tobacco History: Social History   Tobacco Use  Smoking Status Never  Smokeless Tobacco Never   Counseling given: Not Answered   Outpatient Medications Prior to Visit  Medication Sig Dispense Refill   cetirizine (ZYRTEC) 10 MG tablet Take 10 mg by mouth daily as needed for allergies.     doxycycline (VIBRA-TABS) 100 MG tablet Take 1 tablet (100 mg total) by mouth 2 (two) times daily. 42 tablet 0   famotidine (PEPCID) 40 MG tablet Take 1 tablet (40 mg total) by mouth at bedtime. 90 tablet 3   gabapentin (NEURONTIN) 300 MG capsule Take 1-2 capsules by mouth at bedtime.     ibuprofen (ADVIL) 800 MG tablet Take 800 mg by mouth 3 (three) times daily.     JANUVIA 100 MG tablet Take 1 tablet (100 mg total) by mouth daily. 90 tablet 0   Lactobacillus (PROBIOTIC ACIDOPHILUS) CAPS Taking daily     pantoprazole (PROTONIX) 40 MG tablet TAKE 1 TABLET BY MOUTH EVERY DAY 90 tablet 1   rosuvastatin (CRESTOR) 5 MG tablet Take 1 tablet (5 mg total) by mouth daily. 90 tablet 1   sildenafil (REVATIO) 20 MG tablet TAKE 3 TO 5 TABLETS BY MOUTH AS NEEDED 5 tablet 5   traMADol (ULTRAM) 50 MG tablet tramadol  50 mg tablet  Take 1 tablet 3 times a day by oral route as needed.     vitamin C (ASCORBIC ACID) 500 MG tablet Take 500 mg by mouth daily.     No facility-administered medications prior to visit.     Review of Systems  Constitutional:  Negative for chills, diaphoresis, fever, malaise/fatigue and weight loss.  HENT:  Negative for congestion.   Respiratory:  Positive for shortness of breath. Negative for cough, hemoptysis, sputum production and wheezing.   Cardiovascular:  Positive for chest pain. Negative for palpitations and leg swelling.     Physical Exam  BP 126/80 (BP Location: Left Arm, Patient Position: Sitting, Cuff Size: Normal)   Pulse 76   Temp 98.3 F (36.8 C) (Oral)   Ht 6\' 1"  (1.854 m)   Wt 212 lb  3.2 oz (96.3 kg)   SpO2 98%   BMI 28.00 kg/m   Physical Exam: General: Well-appearing, no acute distress HENT: [place], AT Neck: Soft c-collar in place Eyes: EOMI, no scleral icterus Respiratory: Clear to auscultation bilaterally.  No crackles, wheezing or rales Cardiovascular: RRR, -M/R/G, no JVD Extremities:-Edema,-tenderness Neuro: AAO x4, CNII-XII grossly intact Psych: Normal mood, normal affect Musculoskeletal: Non-reproducible chest pain    Lab Results:      BNP No results found for: BNP  ProBNP No results found for: PROBNP  Imaging: No results found.       Latest Ref Rng & Units 02/18/2021    2:57 PM  PFT Results  FVC-Pre L 4.59    FVC-Predicted Pre % 83    FVC-Post L 4.21    FVC-Predicted Post % 76    Pre FEV1/FVC % % 85    Post FEV1/FCV % % 76    FEV1-Pre L 3.91    FEV1-Predicted Pre % 92    FEV1-Post L 3.21    DLCO uncorrected ml/min/mmHg 27.86    DLCO UNC% % 88    DLCO corrected ml/min/mmHg 27.71    DLCO COR %Predicted % 87    DLVA Predicted % 103    TLC L 7.07    TLC % Predicted % 93    RV % Predicted % 92      No results found for: NITRICOXIDE      Assessment & Plan:   No problem-specific Assessment & Plan notes found for this encounter.  Atypical chest pain CXR with no infiltrate, effusion or edema Unlikely related to lungs. May be musculoskeletal Recommend upper body stretching Please contact your PCP for further work-up if pain continues  OSA Nocturnal dyspnea Please set up a checking account if able so that you can pay for your CPAP device Recommend starting CPAP for uncontrolled OSA   Follow-up with Dr. Halford Chessman or NP in one month  Ahlia Lemanski Rodman Pickle, MD 05/08/2022

## 2022-05-08 NOTE — Telephone Encounter (Signed)
Spoke with patient today. 

## 2022-05-12 NOTE — Addendum Note (Signed)
Encounter addended by: Novella Olive on: 05/12/2022 1:36 PM  Actions taken: Letter saved

## 2022-05-31 ENCOUNTER — Inpatient Hospital Stay (HOSPITAL_COMMUNITY): Payer: BC Managed Care – PPO

## 2022-05-31 ENCOUNTER — Emergency Department (HOSPITAL_COMMUNITY): Payer: BC Managed Care – PPO

## 2022-05-31 ENCOUNTER — Other Ambulatory Visit: Payer: Self-pay

## 2022-05-31 ENCOUNTER — Inpatient Hospital Stay (HOSPITAL_COMMUNITY)
Admission: EM | Admit: 2022-05-31 | Discharge: 2022-06-04 | DRG: 389 | Disposition: A | Discharge status: Home / Self Care | Payer: BC Managed Care – PPO | Attending: Internal Medicine | Admitting: Internal Medicine

## 2022-05-31 ENCOUNTER — Encounter (HOSPITAL_COMMUNITY): Payer: Self-pay

## 2022-05-31 DIAGNOSIS — E860 Dehydration: Secondary | ICD-10-CM | POA: Diagnosis present

## 2022-05-31 DIAGNOSIS — G4733 Obstructive sleep apnea (adult) (pediatric): Secondary | ICD-10-CM | POA: Diagnosis present

## 2022-05-31 DIAGNOSIS — K219 Gastro-esophageal reflux disease without esophagitis: Secondary | ICD-10-CM | POA: Diagnosis not present

## 2022-05-31 DIAGNOSIS — N281 Cyst of kidney, acquired: Secondary | ICD-10-CM | POA: Diagnosis not present

## 2022-05-31 DIAGNOSIS — E119 Type 2 diabetes mellitus without complications: Secondary | ICD-10-CM | POA: Diagnosis not present

## 2022-05-31 DIAGNOSIS — J9811 Atelectasis: Secondary | ICD-10-CM | POA: Diagnosis present

## 2022-05-31 DIAGNOSIS — Z888 Allergy status to other drugs, medicaments and biological substances status: Secondary | ICD-10-CM

## 2022-05-31 DIAGNOSIS — E785 Hyperlipidemia, unspecified: Secondary | ICD-10-CM | POA: Diagnosis not present

## 2022-05-31 DIAGNOSIS — K56600 Partial intestinal obstruction, unspecified as to cause: Principal | ICD-10-CM

## 2022-05-31 DIAGNOSIS — E782 Mixed hyperlipidemia: Secondary | ICD-10-CM | POA: Diagnosis not present

## 2022-05-31 DIAGNOSIS — K589 Irritable bowel syndrome without diarrhea: Secondary | ICD-10-CM | POA: Diagnosis present

## 2022-05-31 DIAGNOSIS — R9431 Abnormal electrocardiogram [ECG] [EKG]: Secondary | ICD-10-CM | POA: Diagnosis not present

## 2022-05-31 DIAGNOSIS — R109 Unspecified abdominal pain: Secondary | ICD-10-CM | POA: Diagnosis not present

## 2022-05-31 DIAGNOSIS — Z79899 Other long term (current) drug therapy: Secondary | ICD-10-CM | POA: Diagnosis not present

## 2022-05-31 DIAGNOSIS — K582 Mixed irritable bowel syndrome: Secondary | ICD-10-CM | POA: Diagnosis not present

## 2022-05-31 DIAGNOSIS — K5669 Other partial intestinal obstruction: Secondary | ICD-10-CM | POA: Diagnosis not present

## 2022-05-31 DIAGNOSIS — K56699 Other intestinal obstruction unspecified as to partial versus complete obstruction: Secondary | ICD-10-CM | POA: Diagnosis not present

## 2022-05-31 DIAGNOSIS — K5651 Intestinal adhesions [bands], with partial obstruction: Secondary | ICD-10-CM | POA: Diagnosis not present

## 2022-05-31 DIAGNOSIS — K56609 Unspecified intestinal obstruction, unspecified as to partial versus complete obstruction: Secondary | ICD-10-CM | POA: Diagnosis present

## 2022-05-31 DIAGNOSIS — N179 Acute kidney failure, unspecified: Secondary | ICD-10-CM | POA: Diagnosis not present

## 2022-05-31 DIAGNOSIS — Z8249 Family history of ischemic heart disease and other diseases of the circulatory system: Secondary | ICD-10-CM

## 2022-05-31 DIAGNOSIS — K5939 Other megacolon: Secondary | ICD-10-CM | POA: Diagnosis not present

## 2022-05-31 DIAGNOSIS — R1110 Vomiting, unspecified: Secondary | ICD-10-CM | POA: Diagnosis not present

## 2022-05-31 DIAGNOSIS — K6389 Other specified diseases of intestine: Secondary | ICD-10-CM | POA: Diagnosis not present

## 2022-05-31 DIAGNOSIS — R079 Chest pain, unspecified: Secondary | ICD-10-CM | POA: Diagnosis not present

## 2022-05-31 DIAGNOSIS — K760 Fatty (change of) liver, not elsewhere classified: Secondary | ICD-10-CM | POA: Diagnosis not present

## 2022-05-31 DIAGNOSIS — E1169 Type 2 diabetes mellitus with other specified complication: Secondary | ICD-10-CM | POA: Diagnosis present

## 2022-05-31 LAB — COMPREHENSIVE METABOLIC PANEL
ALT: 42 U/L (ref 0–44)
AST: 32 U/L (ref 15–41)
Albumin: 5.3 g/dL — ABNORMAL HIGH (ref 3.5–5.0)
Alkaline Phosphatase: 86 U/L (ref 38–126)
Anion gap: 14 (ref 5–15)
BUN: 15 mg/dL (ref 6–20)
CO2: 25 mmol/L (ref 22–32)
Calcium: 11.2 mg/dL — ABNORMAL HIGH (ref 8.9–10.3)
Chloride: 94 mmol/L — ABNORMAL LOW (ref 98–111)
Creatinine, Ser: 1.53 mg/dL — ABNORMAL HIGH (ref 0.61–1.24)
GFR, Estimated: 54 mL/min — ABNORMAL LOW (ref >60)
Glucose, Bld: 178 mg/dL — ABNORMAL HIGH (ref 70–99)
Potassium: 4.6 mmol/L (ref 3.5–5.1)
Sodium: 133 mmol/L — ABNORMAL LOW (ref 135–145)
Total Bilirubin: 1.7 mg/dL — ABNORMAL HIGH (ref 0.3–1.2)
Total Protein: 9.1 g/dL — ABNORMAL HIGH (ref 6.5–8.1)

## 2022-05-31 LAB — URINALYSIS, ROUTINE W REFLEX MICROSCOPIC
Bacteria, UA: NONE SEEN
Bilirubin Urine: NEGATIVE
Glucose, UA: NEGATIVE mg/dL
Hgb urine dipstick: NEGATIVE
Ketones, ur: NEGATIVE mg/dL
Leukocytes,Ua: NEGATIVE
Nitrite: NEGATIVE
Protein, ur: 30 mg/dL — AB
Specific Gravity, Urine: 1.04 — ABNORMAL HIGH (ref 1.005–1.030)
pH: 7 (ref 5.0–8.0)

## 2022-05-31 LAB — CBC
HCT: 50 % (ref 39.0–52.0)
Hemoglobin: 17.9 g/dL — ABNORMAL HIGH (ref 13.0–17.0)
MCH: 29.2 pg (ref 26.0–34.0)
MCHC: 35.8 g/dL (ref 30.0–36.0)
MCV: 81.6 fL (ref 80.0–100.0)
Platelets: 259 10*3/uL (ref 150–400)
RBC: 6.13 MIL/uL — ABNORMAL HIGH (ref 4.22–5.81)
RDW: 12.9 % (ref 11.5–15.5)
WBC: 8.8 10*3/uL (ref 4.0–10.5)
nRBC: 0 % (ref 0.0–0.2)

## 2022-05-31 LAB — MAGNESIUM: Magnesium: 1.7 mg/dL (ref 1.7–2.4)

## 2022-05-31 LAB — LIPASE, BLOOD: Lipase: 37 U/L (ref 11–51)

## 2022-05-31 LAB — TROPONIN I (HIGH SENSITIVITY)
Troponin I (High Sensitivity): 3 ng/L (ref <18)
Troponin I (High Sensitivity): 3 ng/L (ref <18)

## 2022-05-31 LAB — PHOSPHORUS: Phosphorus: 4 mg/dL (ref 2.5–4.6)

## 2022-05-31 LAB — VITAMIN D 25 HYDROXY (VIT D DEFICIENCY, FRACTURES): Vit D, 25-Hydroxy: 45.35 ng/mL (ref 30–100)

## 2022-05-31 LAB — GLUCOSE, CAPILLARY: Glucose-Capillary: 121 mg/dL — ABNORMAL HIGH (ref 70–99)

## 2022-05-31 MED ORDER — SODIUM CHLORIDE 0.9 % IV BOLUS
1000.0000 mL | Freq: Once | INTRAVENOUS | Status: AC
Start: 2022-05-31 — End: 2022-05-31
  Administered 2022-05-31: 1000 mL via INTRAVENOUS

## 2022-05-31 MED ORDER — IOHEXOL 300 MG/ML  SOLN
80.0000 mL | Freq: Once | INTRAMUSCULAR | Status: AC | PRN
  Administered 2022-05-31: 80 mL via INTRAVENOUS

## 2022-05-31 MED ORDER — MORPHINE SULFATE (PF) 4 MG/ML IV SOLN
4.0000 mg | Freq: Once | INTRAVENOUS | Status: AC
  Administered 2022-05-31: 4 mg via INTRAVENOUS
  Filled 2022-05-31: qty 1

## 2022-05-31 MED ORDER — ONDANSETRON HCL 4 MG/2ML IJ SOLN
4.0000 mg | Freq: Once | INTRAMUSCULAR | Status: AC
Start: 2022-05-31 — End: 2022-05-31
  Administered 2022-05-31: 4 mg via INTRAVENOUS
  Filled 2022-05-31: qty 2

## 2022-05-31 MED ORDER — ONDANSETRON HCL 4 MG/2ML IJ SOLN
4.0000 mg | Freq: Four times a day (QID) | INTRAMUSCULAR | Status: DC | PRN
  Administered 2022-06-01 – 2022-06-03 (×5): 4 mg via INTRAVENOUS
  Filled 2022-05-31 (×8): qty 2

## 2022-05-31 MED ORDER — SODIUM CHLORIDE 0.9 % IV SOLN: INTRAVENOUS | Status: DC

## 2022-05-31 MED ORDER — PANTOPRAZOLE SODIUM 40 MG IV SOLR
40.0000 mg | INTRAVENOUS | Status: DC
  Administered 2022-05-31 – 2022-06-03 (×4): 40 mg via INTRAVENOUS
  Filled 2022-05-31 (×4): qty 10

## 2022-05-31 MED ORDER — INSULIN ASPART 100 UNIT/ML IJ SOLN: 0.0000 [IU] | Freq: Three times a day (TID) | INTRAMUSCULAR | Status: DC

## 2022-05-31 MED ORDER — ONDANSETRON HCL 4 MG PO TABS: 4.0000 mg | ORAL_TABLET | Freq: Four times a day (QID) | ORAL | Status: DC | PRN

## 2022-05-31 MED ORDER — LACTATED RINGERS IV BOLUS
1000.0000 mL | Freq: Once | INTRAVENOUS | Status: AC
  Administered 2022-05-31: 1000 mL via INTRAVENOUS

## 2022-05-31 MED ORDER — MORPHINE SULFATE (PF) 2 MG/ML IV SOLN
2.0000 mg | INTRAVENOUS | Status: DC | PRN
  Administered 2022-05-31 – 2022-06-03 (×9): 2 mg via INTRAVENOUS
  Filled 2022-05-31 (×10): qty 1

## 2022-05-31 MED ORDER — DIATRIZOATE MEGLUMINE & SODIUM 66-10 % PO SOLN
90.0000 mL | Freq: Once | ORAL | Status: DC
  Filled 2022-05-31: qty 90

## 2022-05-31 NOTE — Consult Note (Signed)
Reason for Consult: SBO Referring Physician: Joylene Draft is an 56 y.o. male.  HPI:  Pt is a 56 yo M with a h/o multiple SBOs who presents with around 1 day of abdominal pain and around 12 hours of n/v.  He denies fever/chills.  He had last small BM and flatus yesterday evening.  He sometimes can tell this is coming on and can head it off by going to liquids.  He was not able to do this before.  He has had abd surgery 6-7 times including once by Dr. Johna Sheriff.    He refuses NGT as he has been unable to tolerate this in the past.  He describes what sounds like a g tube once in the past.    He is wearing a soft neck collar for issues over the last year.  He was recommended surgery but did not want to have this and has had injections instead.    Past Medical History:  Diagnosis Date   Allergy    Chest wall pain    Cough    Diabetes (HCC)    Diarrhea recurrent   GERD (gastroesophageal reflux disease)    Headache(784.0)    Irritable bowel syndrome    Obstructive sleep apnea    apnealink 10/23/10 AHI 5, Sp02 low 87%   Postural lightheadedness    SBO (small bowel obstruction) (HCC) 12/2015   Sleep apnea    11/27/21 will get new test   Small bowel obstruction [facility])     Past Surgical History:  Procedure Laterality Date   CHOLECYSTECTOMY  2009   COLECTOMY  1994   segmental   COLONOSCOPY  07/17/2011   hemorrhoids, otherwise normal including terminal ileum and random colon biopises   GASTROSTOMY  2005   stamm   LAPAROTOMY  2002, 2005, 2009   SPHINCTEROTOMY  2009   lateral internal   UPPER GASTROINTESTINAL ENDOSCOPY  07/17/2011   normal, including duodenal biopsies    Family History  Problem Relation Age of Onset   Hypertension Mother    Liver disease Father        liver cancer???   Colon cancer Neg Hx    Esophageal cancer Neg Hx    Rectal cancer Neg Hx    Stomach cancer Neg Hx     Social History:  reports that he has never smoked. He has never used smokeless  tobacco. He reports that he does not drink alcohol and does not use drugs.  Allergies:  Allergies  Allergen Reactions   Metformin And Related     diarrhea    Medications:  Current Meds  Medication Sig   Cholecalciferol (VITAMIN D) 50 MCG (2000 UT) CAPS Take 1 capsule by mouth daily.   gabapentin (NEURONTIN) 300 MG capsule Take 1-2 capsules by mouth at bedtime.   ibuprofen (ADVIL) 800 MG tablet Take 800 mg by mouth 3 (three) times daily.   JANUVIA 100 MG tablet Take 1 tablet (100 mg total) by mouth daily.   Lactobacillus-Inulin (CULTURELLE DIGESTIVE DAILY PO) Take 1 capsule by mouth daily.   loratadine (CLARITIN) 10 MG tablet Take 10 mg by mouth daily.   Multiple Vitamin (VITAMIN E/FOLIC ACID/B-6/B-12 PO) Take 1 tablet by mouth daily.   rosuvastatin (CRESTOR) 5 MG tablet Take 1 tablet (5 mg total) by mouth daily.   traMADol (ULTRAM) 50 MG tablet Take 50 mg by mouth every 12 (twelve) hours as needed for moderate pain.   vitamin C (ASCORBIC ACID) 500 MG tablet Take  500 mg by mouth daily.     Results for orders placed or performed during the hospital encounter of 05/31/22 (from the past 48 hour(s))  Lipase, blood     Status: None   Collection Time: 05/31/22 12:16 PM  Result Value Ref Range   Lipase 37 11 - 51 U/L    Comment: Performed at [facility], [address] [place], [place] [address]  Comprehensive metabolic panel     Status: Abnormal   Collection Time: 05/31/22 12:16 PM  Result Value Ref Range   Sodium 133 (L) 135 - 145 mmol/L   Potassium 4.6 3.5 - 5.1 mmol/L   Chloride 94 (L) 98 - 111 mmol/L   CO2 25 22 - 32 mmol/L   Glucose, Bld 178 (H) 70 - 99 mg/dL    Comment: Glucose reference range applies only to samples taken after fasting for at least 8 hours.   BUN 15 6 - 20 mg/dL   Creatinine, Ser 8.65 (H) 0.61 - 1.24 mg/dL   Calcium 78.4 (H) 8.9 - 10.3 mg/dL   Total Protein 9.1 (H) 6.5 - 8.1 g/dL   Albumin 5.3 (H) 3.5 - 5.0 g/dL   AST 32 15 - 41 U/L   ALT 42 0 -  44 U/L   Alkaline Phosphatase 86 38 - 126 U/L   Total Bilirubin 1.7 (H) 0.3 - 1.2 mg/dL   GFR, Estimated 54 (L) >60 mL/min    Comment: (NOTE) Calculated using the CKD-EPI Creatinine Equation (2021)    Anion gap 14 5 - 15    Comment: Performed at [facility], [address], [place], [place] [address]  CBC     Status: Abnormal   Collection Time: 05/31/22 12:16 PM  Result Value Ref Range   WBC 8.8 4.0 - 10.5 K/uL   RBC 6.13 (H) 4.22 - 5.81 MIL/uL   Hemoglobin 17.9 (H) 13.0 - 17.0 g/dL   HCT 52.8 41.3 - 24.4 %   MCV 81.6 80.0 - 100.0 fL   MCH 29.2 26.0 - 34.0 pg   MCHC 35.8 30.0 - 36.0 g/dL   RDW 01.0 27.2 - 53.6 %   Platelets 259 150 - 400 K/uL   nRBC 0.0 0.0 - 0.2 %    Comment: Performed at [facility], [address], [place], [place] [address]  Troponin I (High Sensitivity)     Status: None   Collection Time: 05/31/22  1:03 PM  Result Value Ref Range   Troponin I (High Sensitivity) 3 <18 ng/L    Comment: (NOTE) Elevated high sensitivity troponin I (hsTnI) values and significant  changes across serial measurements may suggest ACS but many other  chronic and acute conditions are known to elevate hsTnI results.  Refer to the "Links" section for chest pain algorithms and additional  guidance. Performed at [facility], [address] [place], [place] [address]   Troponin I (High Sensitivity)     Status: None   Collection Time: 05/31/22  3:45 PM  Result Value Ref Range   Troponin I (High Sensitivity) 3 <18 ng/L    Comment: (NOTE) Elevated high sensitivity troponin I (hsTnI) values and significant  changes across serial measurements may suggest ACS but many other  chronic and acute conditions are known to elevate hsTnI results.  Refer to the "Links" section for chest pain algorithms and additional  guidance. Performed at [facility], [address] [place], [place] [address]  CT Abdomen Pelvis W Contrast  Result Date: 05/31/2022 CLINICAL  DATA:  Acute onset diffuse abdominal pain and distension yesterday. Associated vomiting and constipation. History of multiple small bowel obstructions. EXAM: CT ABDOMEN AND PELVIS WITH CONTRAST TECHNIQUE: Multidetector CT imaging of the abdomen and pelvis was performed using the standard protocol following bolus administration of intravenous contrast. RADIATION DOSE REDUCTION: This exam was performed according to the departmental dose-optimization program which includes automated exposure control, adjustment of the mA and/or kV according to patient size and/or use of iterative reconstruction technique. CONTRAST:  80mL OMNIPAQUE IOHEXOL 300 MG/ML  SOLN COMPARISON:  Abdomen and pelvis CT dated 10/13/2016 and 01/02/2016. Portable chest obtained earlier today. FINDINGS: Lower chest: Mild bibasilar atelectasis. Hepatobiliary: Diffuse low density of the liver relative to the spleen is again demonstrated. Cholecystectomy clips. Pancreas: Unremarkable. No pancreatic ductal dilatation or surrounding inflammatory changes. Spleen: Normal in size without focal abnormality. Adrenals/Urinary Tract: Normal appearing adrenal glands. Small cyst in each kidney. These do not need follow-up. 4 mm calculus or early excreted contrast in the lower pole of the right kidney. No ureteral calculi or hydronephrosis. Unremarkable urinary bladder. Stomach/Bowel: Mildly dilated stomach. Multiple moderately dilated loops of proximal small bowel with normal caliber distal small bowel and colon. No obstructing mass seen. The point of transition is an acute turn in the small bowel in the lower abdomen/upper pelvis to the left of midline. Vascular/Lymphatic: Atheromatous calcifications involving the iliac and femoral arteries bilaterally. No enlarged lymph nodes. Reproductive: Prostate is unremarkable. Other: No abdominal wall hernia or abnormality. No abdominopelvic ascites. Musculoskeletal: Minimal lumbar and mild lower thoracic spine degenerative  changes. IMPRESSION: 1. Moderate partial small bowel obstruction, most likely due to an adhesion at the level of the lower abdomen/upper pelvis to the left of midline. 2. Previously noted diffuse hepatic steatosis. 3. 5 mm calculus or early excreted contrast in the lower pole of the right kidney. Electronically Signed   By: Beckie Salts M.D.   On: 05/31/2022 15:20   DG Chest Port 1 View  Result Date: 05/31/2022 CLINICAL DATA:  Vomiting and abdominal pain. EXAM: PORTABLE CHEST 1 VIEW COMPARISON:  Chest x-ray 05/08/2022 FINDINGS: The cardiac silhouette, mediastinal and hilar contours are within normal limits given the AP projection, portable technique and low lung volumes. Low lung volumes with vascular crowding and streaky basilar atelectasis but no infiltrates, edema or effusions. No pulmonary lesions. The bony thorax is intact. IMPRESSION: Low lung volumes with vascular crowding and streaky basilar atelectasis. Electronically Signed   By: Rudie Meyer M.D.   On: 05/31/2022 13:35    Review of Systems  All other systems reviewed and are negative.  Blood pressure 103/69, pulse 96, temperature 97.7 F (36.5 C), temperature source Oral, resp. rate 18, height 6\' 1"  (1.854 m), weight 96.4 kg, SpO2 97 %.  Physical Exam Vitals reviewed.  Constitutional:      Appearance: Normal appearance. He is normal weight.  HENT:     Head: Normocephalic and atraumatic.     Right Ear: External ear normal.     Left Ear: External ear normal.     Nose: Nose normal.     Mouth/Throat:     Mouth: Mucous membranes are moist.  Eyes:     General: No scleral icterus.       Right eye: No discharge.        Left eye: No discharge.     Extraocular Movements: Extraocular movements intact.     Conjunctiva/sclera: Conjunctivae normal.  Pupils: Pupils are equal, round, and reactive to light.  Neck:     Comments: In c collar Cardiovascular:     Rate and Rhythm: Normal rate and regular rhythm.     Pulses: Normal pulses.      Heart sounds: Normal heart sounds. No murmur heard. Pulmonary:     Effort: Pulmonary effort is normal. No respiratory distress.     Breath sounds: Normal breath sounds. No stridor. No wheezing or rhonchi.  Abdominal:     General: There is no distension.     Palpations: Abdomen is soft. There is no mass.     Tenderness: Tenderness: mildly tender. There is no guarding or rebound.     Hernia: No hernia is present.     Comments: Slightly distended. Soft. Hypoactive bowel sounds.    Musculoskeletal:        General: No swelling, tenderness, deformity or signs of injury.  Skin:    General: Skin is warm and dry.     Capillary Refill: Capillary refill takes 2 to 3 seconds.     Findings: No bruising or erythema.  Neurological:     General: No focal deficit present.     Mental Status: He is alert and oriented to person, place, and time.  Psychiatric:        Mood and Affect: Mood normal.        Behavior: Behavior normal.        Thought Content: Thought content normal.        Judgment: Judgment normal.     Assessment/Plan:  P SBO H/o multiple surgeries.   Recommended ngt and small bowel protocol.  Pt refuses NGT and states that he hasn't tolerated it in the past.  Discussed drinking contrast instead.   Recheck xray.  Almond Lint 05/31/2022, 6:58 PM

## 2022-05-31 NOTE — Assessment & Plan Note (Addendum)
Pre renal in setting of N/V UA nitrite negative, leukocytes negative, specific gravity of 1.040, WBC 0-5 -Renal function improved with hydration -Avoid nephrotoxins. -Strict I's and O's.

## 2022-05-31 NOTE — Assessment & Plan Note (Addendum)
 Resume statin on discharge ?

## 2022-05-31 NOTE — Assessment & Plan Note (Addendum)
56 year old male presenting with abdominal pain, N/V with history of SBO, found to have moderate SBO. Extensive SBO history with multiple laparotomies.  -Patient with nausea, denies any emesis, still with some diffuse abdominal pain.   -Noted to have had some loose stools this morning. -X-ray with contrast in the colon from yesterday however x-ray today with previously seen enteric contrast no longer evident, small bowel obstruction with progressive dilation of small bowel loops within the abdomen largest now measuring approximately 7.5 cm in diameter previously 6.6 cm.. -Started on clears per general surgery and advanced to full liquids. -Replete electrolytes to keep potassium approximately 4, magnesium approximately 2. -IV fluids, pain medication, antiemetics. -NG tube ordered on admission however patient noted to refuse NG tube on presentation. -Mobilize.  -General surgery following.

## 2022-06-01 ENCOUNTER — Inpatient Hospital Stay (HOSPITAL_COMMUNITY): Payer: BC Managed Care – PPO

## 2022-06-01 DIAGNOSIS — K56600 Partial intestinal obstruction, unspecified as to cause: Secondary | ICD-10-CM | POA: Diagnosis not present

## 2022-06-01 DIAGNOSIS — K219 Gastro-esophageal reflux disease without esophagitis: Secondary | ICD-10-CM

## 2022-06-01 DIAGNOSIS — E782 Mixed hyperlipidemia: Secondary | ICD-10-CM

## 2022-06-01 DIAGNOSIS — N179 Acute kidney failure, unspecified: Secondary | ICD-10-CM | POA: Diagnosis not present

## 2022-06-01 DIAGNOSIS — K582 Mixed irritable bowel syndrome: Secondary | ICD-10-CM

## 2022-06-01 DIAGNOSIS — E119 Type 2 diabetes mellitus without complications: Secondary | ICD-10-CM | POA: Diagnosis not present

## 2022-06-01 DIAGNOSIS — G4733 Obstructive sleep apnea (adult) (pediatric): Secondary | ICD-10-CM

## 2022-06-01 DIAGNOSIS — K56609 Unspecified intestinal obstruction, unspecified as to partial versus complete obstruction: Secondary | ICD-10-CM | POA: Diagnosis not present

## 2022-06-01 LAB — BASIC METABOLIC PANEL
Anion gap: 10 (ref 5–15)
BUN: 13 mg/dL (ref 6–20)
CO2: 23 mmol/L (ref 22–32)
Calcium: 8.8 mg/dL — ABNORMAL LOW (ref 8.9–10.3)
Chloride: 104 mmol/L (ref 98–111)
Creatinine, Ser: 0.94 mg/dL (ref 0.61–1.24)
GFR, Estimated: >60 mL/min (ref >60)
Glucose, Bld: 103 mg/dL — ABNORMAL HIGH (ref 70–99)
Potassium: 4 mmol/L (ref 3.5–5.1)
Sodium: 137 mmol/L (ref 135–145)

## 2022-06-01 LAB — HIV ANTIBODY (ROUTINE TESTING W REFLEX): HIV Screen 4th Generation wRfx: NONREACTIVE

## 2022-06-01 LAB — CBC
HCT: 41.3 % (ref 39.0–52.0)
Hemoglobin: 13.8 g/dL (ref 13.0–17.0)
MCH: 28.2 pg (ref 26.0–34.0)
MCHC: 33.4 g/dL (ref 30.0–36.0)
MCV: 84.3 fL (ref 80.0–100.0)
Platelets: 190 10*3/uL (ref 150–400)
RBC: 4.9 MIL/uL (ref 4.22–5.81)
RDW: 13 % (ref 11.5–15.5)
WBC: 6.2 10*3/uL (ref 4.0–10.5)
nRBC: 0 % (ref 0.0–0.2)

## 2022-06-01 LAB — MAGNESIUM: Magnesium: 1.7 mg/dL (ref 1.7–2.4)

## 2022-06-01 LAB — GLUCOSE, CAPILLARY
Glucose-Capillary: 122 mg/dL — ABNORMAL HIGH (ref 70–99)
Glucose-Capillary: 120 mg/dL — ABNORMAL HIGH (ref 70–99)
Glucose-Capillary: 113 mg/dL — ABNORMAL HIGH (ref 70–99)

## 2022-06-01 MED ORDER — INSULIN ASPART 100 UNIT/ML IJ SOLN: 0.0000 [IU] | Freq: Four times a day (QID) | INTRAMUSCULAR | Status: DC

## 2022-06-01 MED ORDER — DIATRIZOATE MEGLUMINE & SODIUM 66-10 % PO SOLN
90.0000 mL | Freq: Once | ORAL | Status: AC
  Administered 2022-06-01: 90 mL via ORAL
  Filled 2022-06-01: qty 90

## 2022-06-01 MED ORDER — MAGNESIUM SULFATE 2 GM/50ML IV SOLN
2.0000 g | Freq: Once | INTRAVENOUS | Status: AC
Start: 2022-06-01 — End: 2022-06-01
  Administered 2022-06-01: 2 g via INTRAVENOUS
  Filled 2022-06-01: qty 50

## 2022-06-02 DIAGNOSIS — N179 Acute kidney failure, unspecified: Secondary | ICD-10-CM | POA: Diagnosis not present

## 2022-06-02 DIAGNOSIS — K56600 Partial intestinal obstruction, unspecified as to cause: Secondary | ICD-10-CM | POA: Diagnosis not present

## 2022-06-02 DIAGNOSIS — E119 Type 2 diabetes mellitus without complications: Secondary | ICD-10-CM | POA: Diagnosis not present

## 2022-06-02 DIAGNOSIS — K56609 Unspecified intestinal obstruction, unspecified as to partial versus complete obstruction: Secondary | ICD-10-CM | POA: Diagnosis not present

## 2022-06-02 LAB — GLUCOSE, CAPILLARY
Glucose-Capillary: 101 mg/dL — ABNORMAL HIGH (ref 70–99)
Glucose-Capillary: 101 mg/dL — ABNORMAL HIGH (ref 70–99)
Glucose-Capillary: 104 mg/dL — ABNORMAL HIGH (ref 70–99)
Glucose-Capillary: 106 mg/dL — ABNORMAL HIGH (ref 70–99)
Glucose-Capillary: 107 mg/dL — ABNORMAL HIGH (ref 70–99)
Glucose-Capillary: 126 mg/dL — ABNORMAL HIGH (ref 70–99)

## 2022-06-02 LAB — CBC WITH DIFFERENTIAL/PLATELET
Abs Immature Granulocytes: 0.01 10*3/uL (ref 0.00–0.07)
Basophils Absolute: 0 10*3/uL (ref 0.0–0.1)
Basophils Relative: 0 %
Eosinophils Absolute: 0 10*3/uL (ref 0.0–0.5)
Eosinophils Relative: 0 %
HCT: 41.2 % (ref 39.0–52.0)
Hemoglobin: 13.6 g/dL (ref 13.0–17.0)
Immature Granulocytes: 0 %
Lymphocytes Relative: 30 %
Lymphs Abs: 2 10*3/uL (ref 0.7–4.0)
MCH: 28 pg (ref 26.0–34.0)
MCHC: 33 g/dL (ref 30.0–36.0)
MCV: 84.9 fL (ref 80.0–100.0)
Monocytes Absolute: 0.4 10*3/uL (ref 0.1–1.0)
Monocytes Relative: 6 %
Neutro Abs: 4.2 10*3/uL (ref 1.7–7.7)
Neutrophils Relative %: 64 %
Platelets: 171 10*3/uL (ref 150–400)
RBC: 4.85 MIL/uL (ref 4.22–5.81)
RDW: 12.6 % (ref 11.5–15.5)
WBC: 6.7 10*3/uL (ref 4.0–10.5)
nRBC: 0 % (ref 0.0–0.2)

## 2022-06-02 LAB — RENAL FUNCTION PANEL
Albumin: 3.5 g/dL (ref 3.5–5.0)
Anion gap: 12 (ref 5–15)
BUN: 14 mg/dL (ref 6–20)
CO2: 21 mmol/L — ABNORMAL LOW (ref 22–32)
Calcium: 8.5 mg/dL — ABNORMAL LOW (ref 8.9–10.3)
Chloride: 104 mmol/L (ref 98–111)
Creatinine, Ser: 1.09 mg/dL (ref 0.61–1.24)
GFR, Estimated: >60 mL/min (ref >60)
Glucose, Bld: 108 mg/dL — ABNORMAL HIGH (ref 70–99)
Phosphorus: 1.8 mg/dL — ABNORMAL LOW (ref 2.5–4.6)
Potassium: 3.6 mmol/L (ref 3.5–5.1)
Sodium: 137 mmol/L (ref 135–145)

## 2022-06-02 LAB — PTH, INTACT AND CALCIUM
Calcium, Total (PTH): 9.3 mg/dL (ref 8.7–10.2)
PTH: 21 pg/mL (ref 15–65)

## 2022-06-02 LAB — MAGNESIUM: Magnesium: 1.9 mg/dL (ref 1.7–2.4)

## 2022-06-02 MED ORDER — POTASSIUM PHOSPHATES 15 MMOLE/5ML IV SOLN
30.0000 mmol | Freq: Once | INTRAVENOUS | Status: AC
  Administered 2022-06-02: 30 mmol via INTRAVENOUS
  Filled 2022-06-02: qty 10

## 2022-06-02 MED ORDER — MAGNESIUM SULFATE 2 GM/50ML IV SOLN
2.0000 g | Freq: Once | INTRAVENOUS | Status: AC
Start: 2022-06-02 — End: 2022-06-02
  Administered 2022-06-02: 2 g via INTRAVENOUS
  Filled 2022-06-02: qty 50

## 2022-06-02 MED ORDER — INSULIN ASPART 100 UNIT/ML IJ SOLN: 0.0000 [IU] | Freq: Three times a day (TID) | INTRAMUSCULAR | Status: DC

## 2022-06-02 NOTE — Progress Notes (Signed)
Mobility Specialist Progress Note:   06/02/22 1215  Mobility  Activity Ambulated with assistance in hallway  Level of Assistance Standby assist, set-up cues, supervision of patient - no hands on  Assistive Device Other (Comment) (IV Pole)  Distance Ambulated (ft) 250 ft  Activity Response Tolerated well  $Mobility charge 1 Mobility   Pt agreeable to mobility session. Required MinA to get EOB, minG with ambulation. Distance limited secondary to fatigue. Pt back in bed with all needs met.   Addison L[facility] Secure Chat or Office Phone: [phone]

## 2022-06-03 ENCOUNTER — Inpatient Hospital Stay (HOSPITAL_COMMUNITY): Payer: BC Managed Care – PPO

## 2022-06-03 DIAGNOSIS — K56609 Unspecified intestinal obstruction, unspecified as to partial versus complete obstruction: Secondary | ICD-10-CM | POA: Diagnosis not present

## 2022-06-03 DIAGNOSIS — N179 Acute kidney failure, unspecified: Secondary | ICD-10-CM | POA: Diagnosis not present

## 2022-06-03 DIAGNOSIS — E119 Type 2 diabetes mellitus without complications: Secondary | ICD-10-CM | POA: Diagnosis not present

## 2022-06-03 DIAGNOSIS — K56600 Partial intestinal obstruction, unspecified as to cause: Secondary | ICD-10-CM | POA: Diagnosis not present

## 2022-06-03 LAB — RENAL FUNCTION PANEL
Albumin: 3.5 g/dL (ref 3.5–5.0)
Anion gap: 8 (ref 5–15)
BUN: 9 mg/dL (ref 6–20)
CO2: 20 mmol/L — ABNORMAL LOW (ref 22–32)
Calcium: 8.3 mg/dL — ABNORMAL LOW (ref 8.9–10.3)
Chloride: 107 mmol/L (ref 98–111)
Creatinine, Ser: 0.84 mg/dL (ref 0.61–1.24)
GFR, Estimated: >60 mL/min (ref >60)
Glucose, Bld: 90 mg/dL (ref 70–99)
Phosphorus: 1.9 mg/dL — ABNORMAL LOW (ref 2.5–4.6)
Potassium: 3.9 mmol/L (ref 3.5–5.1)
Sodium: 135 mmol/L (ref 135–145)

## 2022-06-03 LAB — MAGNESIUM: Magnesium: 1.9 mg/dL (ref 1.7–2.4)

## 2022-06-03 LAB — GLUCOSE, CAPILLARY
Glucose-Capillary: 107 mg/dL — ABNORMAL HIGH (ref 70–99)
Glucose-Capillary: 107 mg/dL — ABNORMAL HIGH (ref 70–99)
Glucose-Capillary: 122 mg/dL — ABNORMAL HIGH (ref 70–99)
Glucose-Capillary: 96 mg/dL (ref 70–99)

## 2022-06-03 MED ORDER — ENOXAPARIN SODIUM 40 MG/0.4ML IJ SOSY
40.0000 mg | PREFILLED_SYRINGE | INTRAMUSCULAR | Status: DC
  Administered 2022-06-03: 40 mg via SUBCUTANEOUS
  Filled 2022-06-03: qty 0.4

## 2022-06-03 MED ORDER — DEXTROSE 5 % IV SOLN
30.0000 mmol | Freq: Once | INTRAVENOUS | Status: AC
  Administered 2022-06-03: 30 mmol via INTRAVENOUS
  Filled 2022-06-03: qty 10

## 2022-06-03 NOTE — Progress Notes (Signed)
Patient ID: [name], male   DOB: [birth year], 56 y.o.   MRN: [id number] [facility] Surgery Progress Note     Subjective: CC-  Still having some abdominal pain and nausea. Only drank a little gingerale this morning. Passing small amount of flatus. He has had 2 loose stools this morning.  Objective: Vital signs in last 24 hours: Temp:  [98.1 F (36.7 C)-99.6 F (37.6 C)] 98.1 F (36.7 C) (06/27 0848) Pulse Rate:  [74-86] 86 (06/27 0848) Resp:  [16-18] 18 (06/27 0848) BP: (117-144)/(83-89) 144/83 (06/27 0848) SpO2:  [96 %-100 %] 100 % (06/27 0848) Last BM Date : 06/03/22  Intake/Output from previous day: 06/26 0701 - 06/27 0700 In: 410.1 [IV Piggyback:410.1] Out: -  Intake/Output this shift: No intake/output data recorded.  PE: Gen:  Alert, NAD, pleasant Abd: soft, nondistended, mild diffuse tenderness without rebound or guarding  Lab Results:  Recent Labs    06/01/22 0052 06/02/22 0201  WBC 6.2 6.7  HGB 13.8 13.6  HCT 41.3 41.2  PLT 190 171   BMET Recent Labs    06/02/22 0201 06/03/22 0053  NA 137 135  K 3.6 3.9  CL 104 107  CO2 21* 20*  GLUCOSE 108* 90  BUN 14 9  CREATININE 1.09 0.84  CALCIUM 8.5* 8.3*   PT/INR No results for input(s): "LABPROT", "INR" in the last 72 hours. CMP     Component Value Date/Time   NA 135 06/03/2022 0053   NA 140 04/01/2021 1509   K 3.9 06/03/2022 0053   CL 107 06/03/2022 0053   CO2 20 (L) 06/03/2022 0053   GLUCOSE 90 06/03/2022 0053   BUN 9 06/03/2022 0053   BUN 8 04/01/2021 1509   CREATININE 0.84 06/03/2022 0053   CREATININE 1.04 05/02/2022 1634   CALCIUM 8.3 (L) 06/03/2022 0053   CALCIUM 9.3 05/31/2022 1633   PROT 9.1 (H) 05/31/2022 1216   PROT 7.2 11/27/2021 1226   ALBUMIN 3.5 06/03/2022 0053   ALBUMIN 4.9 09/23/2021 1510   AST 32 05/31/2022 1216   ALT 42 05/31/2022 1216   ALKPHOS 86 05/31/2022 1216   BILITOT 1.7 (H) 05/31/2022 1216   BILITOT 0.9 09/23/2021 1510   GFRNONAA >60 06/03/2022 0053    GFRAA >60 04/09/2018 1242   Lipase     Component Value Date/Time   LIPASE 37 05/31/2022 1216       Studies/Results: DG Abd Portable 1V-Small Bowel Obstruction Protocol-initial, 8 hr delay  Result Date: 06/01/2022 CLINICAL DATA:  Small-bowel obstruction EXAM: PORTABLE ABDOMEN - 1 VIEW COMPARISON:  Film from the previous day. FINDINGS: Stable small bowel dilatation is noted similar to that seen on the previous day. Previously administered contrast is noted within the distal small bowel as well as the proximal and mid colon consistent with partial small bowel obstruction. IMPRESSION: Passage of previously administered contrast into the colon. Some persistent small bowel dilatation is noted. Electronically Signed   By: Alcide Clever M.D.   On: 06/01/2022 21:09    Anti-infectives: Anti-infectives (From admission, onward)    None        Assessment/Plan SBO - oral gastrograffin given 6/25, delayed film 6/25 showed contrast in colon  - Having some bowel function but also complaining of persistent abdominal pain and nausea. Repeat film this morning. Continue full liquids for now. - mobilize    ID - none FEN - IVF, FLD VTE - ok for chemical dvt ppx from surgical standpoint Foley - none   IBS HLD OSA  DM AKI   I reviewed hospitalist notes, last 24 h vitals and pain scores, last 48 h intake and output, last 24 h labs and trends, and last 24 h imaging results.    LOS: 3 days    Franne Forts, [facility] Surgery 06/03/2022, 9:59 AM Please see Amion for pager number during day hours 7:00am-4:30pm

## 2022-06-04 ENCOUNTER — Other Ambulatory Visit (HOSPITAL_COMMUNITY): Payer: Self-pay

## 2022-06-04 DIAGNOSIS — K56609 Unspecified intestinal obstruction, unspecified as to partial versus complete obstruction: Secondary | ICD-10-CM | POA: Diagnosis not present

## 2022-06-04 LAB — CBC
HCT: 41.8 % (ref 39.0–52.0)
Hemoglobin: 14.2 g/dL (ref 13.0–17.0)
MCH: 28.3 pg (ref 26.0–34.0)
MCHC: 34 g/dL (ref 30.0–36.0)
MCV: 83.4 fL (ref 80.0–100.0)
Platelets: 182 10*3/uL (ref 150–400)
RBC: 5.01 MIL/uL (ref 4.22–5.81)
RDW: 12.6 % (ref 11.5–15.5)
WBC: 5.6 10*3/uL (ref 4.0–10.5)
nRBC: 0 % (ref 0.0–0.2)

## 2022-06-04 LAB — RENAL FUNCTION PANEL
Albumin: 3.8 g/dL (ref 3.5–5.0)
Anion gap: 13 (ref 5–15)
BUN: 9 mg/dL (ref 6–20)
CO2: 18 mmol/L — ABNORMAL LOW (ref 22–32)
Calcium: 8.9 mg/dL (ref 8.9–10.3)
Chloride: 105 mmol/L (ref 98–111)
Creatinine, Ser: 0.87 mg/dL (ref 0.61–1.24)
GFR, Estimated: >60 mL/min (ref >60)
Glucose, Bld: 87 mg/dL (ref 70–99)
Phosphorus: 2.1 mg/dL — ABNORMAL LOW (ref 2.5–4.6)
Potassium: 3.9 mmol/L (ref 3.5–5.1)
Sodium: 136 mmol/L (ref 135–145)

## 2022-06-04 LAB — MAGNESIUM: Magnesium: 1.8 mg/dL (ref 1.7–2.4)

## 2022-06-04 LAB — GLUCOSE, CAPILLARY: Glucose-Capillary: 91 mg/dL (ref 70–99)

## 2022-06-04 MED ORDER — POLYETHYLENE GLYCOL 3350 17 GM/SCOOP PO POWD
17.0000 g | Freq: Every day | ORAL | 0 refills | Status: AC
  Filled 2022-06-04: qty 238, 14d supply, fill #0

## 2022-06-04 MED ORDER — CALCIUM CARBONATE ANTACID 500 MG PO CHEW
1.0000 | CHEWABLE_TABLET | Freq: Four times a day (QID) | ORAL | Status: DC | PRN
Start: 2022-06-04 — End: 2022-06-04

## 2022-06-04 MED ORDER — ONDANSETRON HCL 4 MG PO TABS
4.0000 mg | ORAL_TABLET | Freq: Four times a day (QID) | ORAL | 0 refills | Status: DC | PRN
  Filled 2022-06-04: qty 20, 5d supply, fill #0

## 2022-06-04 MED ORDER — POLYETHYLENE GLYCOL 3350 17 G PO PACK
17.0000 g | PACK | Freq: Every day | ORAL | Status: DC
  Administered 2022-06-04: 17 g via ORAL
  Filled 2022-06-04: qty 1

## 2022-06-04 MED ORDER — SIMETHICONE 80 MG PO CHEW
80.0000 mg | CHEWABLE_TABLET | Freq: Four times a day (QID) | ORAL | 0 refills | Status: DC
  Filled 2022-06-04: qty 30, 8d supply, fill #0

## 2022-06-04 MED ORDER — SIMETHICONE 80 MG PO CHEW
80.0000 mg | CHEWABLE_TABLET | Freq: Four times a day (QID) | ORAL | Status: DC
  Administered 2022-06-04: 80 mg via ORAL
  Filled 2022-06-04: qty 1

## 2022-06-04 NOTE — Progress Notes (Signed)
Patient ID: [name], male   DOB: [birth year], 56 y.o.   MRN: [id number] [facility] Surgery Progress Note     Subjective: CC-  Up in chair. Feeling better than yesterday. States that he has had two loose Bms since midnight and is passing a little gas. Less abdominal pain and bloating today. Still having some nausea but thinks it may be because he's hungry. Has not had anything to eat/drink yet today.  Objective: Vital signs in last 24 hours: Temp:  [98.3 F (36.8 C)-98.6 F (37 C)] 98.3 F (36.8 C) (06/28 0836) Pulse Rate:  [72-92] 92 (06/28 0836) Resp:  [16-18] 16 (06/28 0836) BP: (109-121)/(70-88) 109/77 (06/28 0836) SpO2:  [97 %-98 %] 98 % (06/28 0836) Last BM Date : 06/03/22  Intake/Output from previous day: 06/27 0701 - 06/28 0700 In: 5081.2 [I.V.:4570.8; IV Piggyback:510.4] Out: -  Intake/Output this shift: No intake/output data recorded.  PE: Gen:  Alert, NAD, pleasant Abd: soft, nondistended, nontender, +BS  Lab Results:  Recent Labs    06/02/22 0201 06/04/22 0058  WBC 6.7 5.6  HGB 13.6 14.2  HCT 41.2 41.8  PLT 171 182   BMET Recent Labs    06/03/22 0053 06/04/22 0058  NA 135 136  K 3.9 3.9  CL 107 105  CO2 20* 18*  GLUCOSE 90 87  BUN 9 9  CREATININE 0.84 0.87  CALCIUM 8.3* 8.9   PT/INR No results for input(s): "LABPROT", "INR" in the last 72 hours. CMP     Component Value Date/Time   NA 136 06/04/2022 0058   NA 140 04/01/2021 1509   K 3.9 06/04/2022 0058   CL 105 06/04/2022 0058   CO2 18 (L) 06/04/2022 0058   GLUCOSE 87 06/04/2022 0058   BUN 9 06/04/2022 0058   BUN 8 04/01/2021 1509   CREATININE 0.87 06/04/2022 0058   CREATININE 1.04 05/02/2022 1634   CALCIUM 8.9 06/04/2022 0058   CALCIUM 9.3 05/31/2022 1633   PROT 9.1 (H) 05/31/2022 1216   PROT 7.2 11/27/2021 1226   ALBUMIN 3.8 06/04/2022 0058   ALBUMIN 4.9 09/23/2021 1510   AST 32 05/31/2022 1216   ALT 42 05/31/2022 1216   ALKPHOS 86 05/31/2022 1216   BILITOT 1.7 (H)  05/31/2022 1216   BILITOT 0.9 09/23/2021 1510   GFRNONAA >60 06/04/2022 0058   GFRAA >60 04/09/2018 1242   Lipase     Component Value Date/Time   LIPASE 37 05/31/2022 1216       Studies/Results: DG Abd Portable 1V  Result Date: 06/03/2022 CLINICAL DATA:  Small-bowel obstruction EXAM: PORTABLE ABDOMEN - 1 VIEW COMPARISON:  06/01/2022 FINDINGS: Progressive dilation of small bowel loops within the abdomen, largest now measuring approximately 7.5 cm in diameter (previously approximately 6.6 cm). Air is seen within the colon. Previously seen enteric contrast is no longer evident. No gross free intraperitoneal air. IMPRESSION: Findings of small-bowel obstruction with progressive dilation of small bowel loops within the abdomen, largest now measuring approximately 7.5 cm in diameter (previously approximately 6.6 cm). Electronically Signed   By: Duanne Guess D.O.   On: 06/03/2022 10:22    Anti-infectives: Anti-infectives (From admission, onward)    None        Assessment/Plan SBO - oral gastrograffin given 6/25, delayed film 6/25 showed contrast in colon  - Clinically improved today. I would like to make sure he tolerates some full liquids this morning. If so and he continues to have bowel function I think he can be discharged later  today on full liquids, and gradually advance diet at home.    ID - none FEN - IVF, FLD VTE - lovenox Foley - none   IBS HLD OSA DM AKI   I reviewed hospitalist notes, last 24 h vitals and pain scores, last 48 h intake and output, last 24 h labs and trends, and last 24 h imaging results.    LOS: 4 days    Franne Forts, [facility] Surgery 06/04/2022, 9:08 AM Please see Amion for pager number during day hours 7:00am-4:30pm

## 2022-06-04 NOTE — Care Management (Signed)
Tried to reach patient, unsuccessful. Added instructions to AVS to have patient follow up with primary care for referral for CPAP assessment. Requested nurse to review this information on his AVS with him.

## 2022-06-04 NOTE — Plan of Care (Signed)
  Problem: Education: Goal: Knowledge of General Education information will improve Description Including pain rating scale, medication(s)/side effects and non-pharmacologic comfort measures Outcome: Progressing   Problem: Health Behavior/Discharge Planning: Goal: Ability to manage health-related needs will improve Outcome: Progressing   

## 2022-06-04 NOTE — Plan of Care (Signed)
  Problem: Education: Goal: Knowledge of General Education information will improve Description: Including pain rating scale, medication(s)/side effects and non-pharmacologic comfort measures 06/04/2022 1239 by Karolee Ohs, RN Outcome: Adequate for Discharge 06/04/2022 1238 by Karolee Ohs, RN Outcome: Progressing   Problem: Health Behavior/Discharge Planning: Goal: Ability to manage health-related needs will improve 06/04/2022 1239 by Karolee Ohs, RN Outcome: Adequate for Discharge 06/04/2022 1238 by Karolee Ohs, RN Outcome: Progressing   Problem: Clinical Measurements: Goal: Ability to maintain clinical measurements within normal limits will improve Outcome: Adequate for Discharge Goal: Will remain free from infection Outcome: Adequate for Discharge Goal: Diagnostic test results will improve Outcome: Adequate for Discharge Goal: Respiratory complications will improve Outcome: Adequate for Discharge Goal: Cardiovascular complication will be avoided Outcome: Adequate for Discharge   Problem: Activity: Goal: Risk for activity intolerance will decrease Outcome: Adequate for Discharge   Problem: Nutrition: Goal: Adequate nutrition will be maintained Outcome: Adequate for Discharge   Problem: Coping: Goal: Level of anxiety will decrease Outcome: Adequate for Discharge   Problem: Elimination: Goal: Will not experience complications related to bowel motility Outcome: Adequate for Discharge Goal: Will not experience complications related to urinary retention Outcome: Adequate for Discharge   Problem: Pain Managment: Goal: General experience of comfort will improve Outcome: Adequate for Discharge   Problem: Safety: Goal: Ability to remain free from injury will improve Outcome: Adequate for Discharge   Problem: Skin Integrity: Goal: Risk for impaired skin integrity will decrease Outcome: Adequate for Discharge   Problem: Education: Goal: Ability to describe  self-care measures that may prevent or decrease complications (Diabetes Survival Skills Education) will improve Outcome: Adequate for Discharge Goal: Individualized Educational Video(s) Outcome: Adequate for Discharge   Problem: Coping: Goal: Ability to adjust to condition or change in health will improve Outcome: Adequate for Discharge   Problem: Fluid Volume: Goal: Ability to maintain a balanced intake and output will improve Outcome: Adequate for Discharge   Problem: Health Behavior/Discharge Planning: Goal: Ability to identify and utilize available resources and services will improve Outcome: Adequate for Discharge Goal: Ability to manage health-related needs will improve Outcome: Adequate for Discharge   Problem: Metabolic: Goal: Ability to maintain appropriate glucose levels will improve Outcome: Adequate for Discharge   Problem: Nutritional: Goal: Maintenance of adequate nutrition will improve Outcome: Adequate for Discharge Goal: Progress toward achieving an optimal weight will improve Outcome: Adequate for Discharge   Problem: Skin Integrity: Goal: Risk for impaired skin integrity will decrease Outcome: Adequate for Discharge

## 2022-06-04 NOTE — Progress Notes (Signed)
Nsg Discharge Note  Admit Date:  05/31/2022 Discharge date: 06/04/2022   [name] to be D/C'd Home per MD order.  AVS completed.   Removed IV-CDI. Reviewed d/c paperwork with patient. Answered all questions and instructed him to followup with PCP for a CPAP. Wheeled stable patient and belongings to main entrance where he was picked up by his wife to d/c to home.Patient/caregiver able to verbalize understanding.  Discharge Medication: Allergies as of 06/04/2022       Reactions   Metformin And Related    diarrhea        Medication List     STOP taking these medications    doxycycline 100 MG tablet Commonly known as: VIBRA-TABS   famotidine 40 MG tablet Commonly known as: PEPCID   ibuprofen 800 MG tablet Commonly known as: ADVIL   Probiotic Acidophilus Caps   sildenafil 20 MG tablet Commonly known as: REVATIO   traMADol 50 MG tablet Commonly known as: ULTRAM       TAKE these medications    CULTURELLE DIGESTIVE DAILY PO Take 1 capsule by mouth daily.   gabapentin 300 MG capsule Commonly known as: NEURONTIN Take 1-2 capsules by mouth at bedtime.   Januvia 100 MG tablet Generic drug: sitaGLIPtin Take 1 tablet (100 mg total) by mouth daily.   loratadine 10 MG tablet Commonly known as: CLARITIN Take 10 mg by mouth daily.   ondansetron 4 MG tablet Commonly known as: ZOFRAN Take 1 tablet (4 mg total) by mouth every 6 (six) hours as needed for nausea.   pantoprazole 40 MG tablet Commonly known as: PROTONIX TAKE 1 TABLET BY MOUTH EVERY DAY What changed: how to take this   polyethylene glycol powder 17 GM/SCOOP powder Commonly known as: GLYCOLAX/MIRALAX Take 17 g by mouth daily.   rosuvastatin 5 MG tablet Commonly known as: CRESTOR Take 1 tablet (5 mg total) by mouth daily.   V-R GAS RELIEF 80 MG chewable tablet Generic drug: simethicone Chew 1 tablet (80 mg total) by mouth 4 (four) times daily.   vitamin C 500 MG tablet Commonly known as:  ASCORBIC ACID Take 500 mg by mouth daily.   Vitamin D 50 MCG (2000 UT) Caps Take 1 capsule by mouth daily.   VITAMIN E/FOLIC ACID/B-6/B-12 PO Take 1 tablet by mouth daily.        Discharge Assessment: Vitals:   06/04/22 0529 06/04/22 0836  BP: 121/88 109/77  Pulse: 72 92  Resp: 18 16  Temp: 98.4 F (36.9 C) 98.3 F (36.8 C)  SpO2: 97% 98%   Skin clean, dry and intact without evidence of skin break down, no evidence of skin tears noted. IV catheter discontinued intact. Site without signs and symptoms of complications - no redness or edema noted at insertion site, patient denies c/o pain - only slight tenderness at site.  Dressing with slight pressure applied.  D/c Instructions-Education: Discharge instructions given to patient/family with verbalized understanding. D/c education completed with patient/family including follow up instructions, medication list, d/c activities limitations if indicated, with other d/c instructions as indicated by MD - patient able to verbalize understanding, all questions fully answered. Patient instructed to return to ED, call 911, or call MD for any changes in condition.  Patient escorted via WC, and D/C home via private auto.  Karolee Ohs, RN 06/04/2022 3:42 PM

## 2022-06-06 NOTE — Discharge Summary (Signed)
Physician Discharge Summary   Patient: [name] MRN: [id number] DOB: June 08, 1966  Admit date:     05/31/2022  Discharge date: 06/04/2022  Discharge Physician: Lynden Oxford  PCP: [name], MD  Recommendations at discharge: Follow-up with PCP in 1 week  Discharge Diagnoses: Principal Problem:   Small bowel obstruction (HCC) Active Problems:   AKI (acute kidney injury) (HCC)   Hypercalcemia   Diabetes (HCC)   GERD   Irritable bowel syndrome   Hyperlipidemia   OBSTRUCTIVE SLEEP APNEA, moderate  Assessment and Plan: * Small bowel obstruction Presented with abdominal pain. Had prior history of SBO with laparotomies. General surgery was consulted.  Treated conservatively with Gastrografin on 6/25. Now tolerating full liquid diet. General surgery recommends that the patient will continue full liquid diet for the next few days and then can advance further. Continue with MiraLAX and bowel regimen. Outpatient follow-up with PCP recommended.  AKI (acute kidney injury) (HCC) Pre renal in setting of N/V Treated with IV fluids with significant improvement.  Hypercalcemia -Calcium trending down with hydration. -Outpatient follow-up.  Work-up in the hospital mostly negative.  Type 2 diabetes mellitus, controlled without long-term insulin use. A1C of 6.3 in 05/02/2022  GERD Continue PPI.  Irritable bowel syndrome Continue home probiotics   Hyperlipidemia -Resume statin on discharge.   OBSTRUCTIVE SLEEP APNEA, moderate Supposed to be on cpap, but has not been able to get this outpatient.  Consultants: General surgery Procedures performed:  None DISCHARGE MEDICATION: Allergies as of 06/04/2022       Reactions   Metformin And Related    diarrhea        Medication List     STOP taking these medications    doxycycline 100 MG tablet Commonly known as: VIBRA-TABS   famotidine 40 MG tablet Commonly known as: PEPCID   ibuprofen 800 MG tablet Commonly known  as: ADVIL   Probiotic Acidophilus Caps   sildenafil 20 MG tablet Commonly known as: REVATIO   traMADol 50 MG tablet Commonly known as: ULTRAM       TAKE these medications    CULTURELLE DIGESTIVE DAILY PO Take 1 capsule by mouth daily.   gabapentin 300 MG capsule Commonly known as: NEURONTIN Take 1-2 capsules by mouth at bedtime.   Januvia 100 MG tablet Generic drug: sitaGLIPtin Take 1 tablet (100 mg total) by mouth daily.   loratadine 10 MG tablet Commonly known as: CLARITIN Take 10 mg by mouth daily.   ondansetron 4 MG tablet Commonly known as: ZOFRAN Take 1 tablet (4 mg total) by mouth every 6 (six) hours as needed for nausea.   pantoprazole 40 MG tablet Commonly known as: PROTONIX TAKE 1 TABLET BY MOUTH EVERY DAY What changed: how to take this   polyethylene glycol powder 17 GM/SCOOP powder Commonly known as: GLYCOLAX/MIRALAX Take 17 g by mouth daily.   rosuvastatin 5 MG tablet Commonly known as: CRESTOR Take 1 tablet (5 mg total) by mouth daily.   V-R GAS RELIEF 80 MG chewable tablet Generic drug: simethicone Chew 1 tablet (80 mg total) by mouth 4 (four) times daily.   vitamin C 500 MG tablet Commonly known as: ASCORBIC ACID Take 500 mg by mouth daily.   Vitamin D 50 MCG (2000 UT) Caps Take 1 capsule by mouth daily.   VITAMIN E/FOLIC ACID/B-6/B-12 PO Take 1 tablet by mouth daily.        Follow-up Information     [name], MD. Schedule an appointment as soon as  possible for a visit in 1 week(s).   Specialty: Internal Medicine Why: request referral for CPAP from PCP when you have your follow up appointment Contact information: [address] [place] [id number] [phone]                Disposition: Home Diet recommendation: Full liquid diet  Discharge Exam: Vitals:   06/03/22 1609 06/03/22 2049 06/04/22 0529 06/04/22 0836  BP: 115/82 117/70 121/88 109/77  Pulse: 80 74 72 92  Resp: 17 18 18 16   Temp: 98.4 F  (36.9 C) 98.6 F (37 C) 98.4 F (36.9 C) 98.3 F (36.8 C)  TempSrc: Oral Oral Oral Oral  SpO2: 97% 97% 97% 98%  Weight:      Height:       General: Appear in no distress; no visible Abnormal Neck Mass Or lumps, Conjunctiva normal Cardiovascular: S1 and S2 Present, no Murmur, Respiratory: good respiratory effort, Bilateral Air entry present and CTA, no Crackles, no wheezes Abdomen: Bowel Sound present, Non tender  Extremities: no Pedal edema Neurology: alert and oriented to time, place, and person  Gait not checked due to patient safety concerns Filed Weights   05/31/22 1819  Weight: 96.4 kg   Condition at discharge: stable  The results of significant diagnostics from this hospitalization (including imaging, microbiology, ancillary and laboratory) are listed below for reference.   Imaging Studies: DG Abd Portable 1V  Result Date: 06/03/2022 CLINICAL DATA:  Small-bowel obstruction EXAM: PORTABLE ABDOMEN - 1 VIEW COMPARISON:  06/01/2022 FINDINGS: Progressive dilation of small bowel loops within the abdomen, largest now measuring approximately 7.5 cm in diameter (previously approximately 6.6 cm). Air is seen within the colon. Previously seen enteric contrast is no longer evident. No gross free intraperitoneal air. IMPRESSION: Findings of small-bowel obstruction with progressive dilation of small bowel loops within the abdomen, largest now measuring approximately 7.5 cm in diameter (previously approximately 6.6 cm). Electronically Signed   By: Davina Poke D.O.   On: 06/03/2022 10:22   DG Abd Portable 1V-Small Bowel Obstruction Protocol-initial, 8 hr delay  Result Date: 06/01/2022 CLINICAL DATA:  Small-bowel obstruction EXAM: PORTABLE ABDOMEN - 1 VIEW COMPARISON:  Film from the previous day. FINDINGS: Stable small bowel dilatation is noted similar to that seen on the previous day. Previously administered contrast is noted within the distal small bowel as well as the proximal and mid  colon consistent with partial small bowel obstruction. IMPRESSION: Passage of previously administered contrast into the colon. Some persistent small bowel dilatation is noted. Electronically Signed   By: Inez Catalina M.D.   On: 06/01/2022 21:09   DG Abd Portable 1V-Small Bowel Obstruction Protocol-initial, 8 hr delay  Result Date: 05/31/2022 CLINICAL DATA:  Small-bowel obstruction. EXAM: PORTABLE ABDOMEN - 1 VIEW COMPARISON:  05/31/2022. FINDINGS: Dilated loops of small bowel are noted in the abdomen and pelvis measuring up to 4.6 cm in diameter. Excreted contrast is present in the urinary bladder. Surgical clips are seen in the right upper quadrant. No unexpected calcification. IMPRESSION: Dilated loops of small bowel in the abdomen and pelvis, not significantly changed from the prior exam. Electronically Signed   By: Brett Fairy M.D.   On: 05/31/2022 20:02   CT Abdomen Pelvis W Contrast  Result Date: 05/31/2022 CLINICAL DATA:  Acute onset diffuse abdominal pain and distension yesterday. Associated vomiting and constipation. History of multiple small bowel obstructions. EXAM: CT ABDOMEN AND PELVIS WITH CONTRAST TECHNIQUE: Multidetector CT imaging of the abdomen and pelvis was performed using  the standard protocol following bolus administration of intravenous contrast. RADIATION DOSE REDUCTION: This exam was performed according to the departmental dose-optimization program which includes automated exposure control, adjustment of the mA and/or kV according to patient size and/or use of iterative reconstruction technique. CONTRAST:  61mL OMNIPAQUE IOHEXOL 300 MG/ML  SOLN COMPARISON:  Abdomen and pelvis CT dated 10/13/2016 and 01/02/2016. Portable chest obtained earlier today. FINDINGS: Lower chest: Mild bibasilar atelectasis. Hepatobiliary: Diffuse low density of the liver relative to the spleen is again demonstrated. Cholecystectomy clips. Pancreas: Unremarkable. No pancreatic ductal dilatation or  surrounding inflammatory changes. Spleen: Normal in size without focal abnormality. Adrenals/Urinary Tract: Normal appearing adrenal glands. Small cyst in each kidney. These do not need follow-up. 4 mm calculus or early excreted contrast in the lower pole of the right kidney. No ureteral calculi or hydronephrosis. Unremarkable urinary bladder. Stomach/Bowel: Mildly dilated stomach. Multiple moderately dilated loops of proximal small bowel with normal caliber distal small bowel and colon. No obstructing mass seen. The point of transition is an acute turn in the small bowel in the lower abdomen/upper pelvis to the left of midline. Vascular/Lymphatic: Atheromatous calcifications involving the iliac and femoral arteries bilaterally. No enlarged lymph nodes. Reproductive: Prostate is unremarkable. Other: No abdominal wall hernia or abnormality. No abdominopelvic ascites. Musculoskeletal: Minimal lumbar and mild lower thoracic spine degenerative changes. IMPRESSION: 1. Moderate partial small bowel obstruction, most likely due to an adhesion at the level of the lower abdomen/upper pelvis to the left of midline. 2. Previously noted diffuse hepatic steatosis. 3. 5 mm calculus or early excreted contrast in the lower pole of the right kidney. Electronically Signed   By: Claudie Revering M.D.   On: 05/31/2022 15:20   DG Chest Port 1 View  Result Date: 05/31/2022 CLINICAL DATA:  Vomiting and abdominal pain. EXAM: PORTABLE CHEST 1 VIEW COMPARISON:  Chest x-ray 05/08/2022 FINDINGS: The cardiac silhouette, mediastinal and hilar contours are within normal limits given the AP projection, portable technique and low lung volumes. Low lung volumes with vascular crowding and streaky basilar atelectasis but no infiltrates, edema or effusions. No pulmonary lesions. The bony thorax is intact. IMPRESSION: Low lung volumes with vascular crowding and streaky basilar atelectasis. Electronically Signed   By: Marijo Sanes M.D.   On: 05/31/2022  13:35   DG Chest 2 View  Result Date: 05/08/2022 CLINICAL DATA:  Shortness of breath EXAM: CHEST - 2 VIEW COMPARISON:  12/10/2020 FINDINGS: Normal heart size, mediastinal contours, and pulmonary vascularity. Lungs clear. No infiltrate, pleural effusion, or pneumothorax. Osseous structures unremarkable. IMPRESSION: No acute abnormalities. Electronically Signed   By: Lavonia Dana M.D.   On: 05/08/2022 14:27    Microbiology: Results for orders placed or performed in visit on 05/02/22  Urine Culture     Status: None   Collection Time: 05/02/22  4:34 PM   Specimen: Blood  Result Value Ref Range Status   Source: NOT GIVEN  Final   Status: FINAL  Final   Result: No Growth  Final  TIQ-MISC     Status: None   Collection Time: 05/02/22  4:34 PM  Result Value Ref Range Status   QUESTION/PROBLEM:   Final    Comment: . The specimen submitted is not appropriate for the test(s) ordered. .    QUESTION: UC/KUC or UM/KUM not appropriate for test ordered.  Final    Comment: REQUESTED INFORMATION _________________________________ . AUTHORIZED SIGNATURE __________________________________ . TO PREVENT FURTHER DELAYS IN TESTING, PLEASE COMPLETE INFORMATION ABOVE AND EITHER FAX TO [phone]  OR  EMAIL TO [email or web address] TO  RESOLVE THIS ORDER.     Labs: CBC: Recent Labs  Lab 05/31/22 1216 06/01/22 0052 06/02/22 0201 06/04/22 0058  WBC 8.8 6.2 6.7 5.6  NEUTROABS  --   --  4.2  --   HGB 17.9* 13.8 13.6 14.2  HCT 50.0 41.3 41.2 41.8  MCV 81.6 84.3 84.9 83.4  PLT 259 190 171 182   Basic Metabolic Panel: Recent Labs  Lab 05/31/22 1216 05/31/22 1633 06/01/22 0052 06/02/22 0201 06/03/22 0053 06/04/22 0058  NA 133*  --  137 137 135 136  K 4.6  --  4.0 3.6 3.9 3.9  CL 94*  --  104 104 107 105  CO2 25  --  23 21* 20* 18*  GLUCOSE 178*  --  103* 108* 90 87  BUN 15  --  13 14 9 9   CREATININE 1.53*  --  0.94 1.09 0.84 0.87  CALCIUM 11.2* 9.3 8.8* 8.5* 8.3* 8.9  MG   --  1.7 1.7 1.9 1.9 1.8  PHOS  --  4.0  --  1.8* 1.9* 2.1*   Liver Function Tests: Recent Labs  Lab 05/31/22 1216 06/02/22 0201 06/03/22 0053 06/04/22 0058  AST 32  --   --   --   ALT 42  --   --   --   ALKPHOS 86  --   --   --   BILITOT 1.7*  --   --   --   PROT 9.1*  --   --   --   ALBUMIN 5.3* 3.5 3.5 3.8   CBG: Recent Labs  Lab 06/03/22 0846 06/03/22 1145 06/03/22 1607 06/03/22 2103 06/04/22 0734  GLUCAP 107* 107* 122* 96 91    Discharge time spent: greater than 30 minutes.  Signed: 06/06/22, MD [facility] 06/04/2022

## 2022-06-11 ENCOUNTER — Telehealth: Payer: Self-pay | Admitting: Diagnostic Neuroimaging

## 2022-06-11 ENCOUNTER — Telehealth: Payer: Self-pay | Admitting: Internal Medicine

## 2022-06-11 DIAGNOSIS — R200 Anesthesia of skin: Secondary | ICD-10-CM | POA: Insufficient documentation

## 2022-06-11 DIAGNOSIS — G8929 Other chronic pain: Secondary | ICD-10-CM

## 2022-06-11 MED ORDER — GABAPENTIN 300 MG PO CAPS: 300.0000 mg | ORAL_CAPSULE | Freq: Three times a day (TID) | ORAL | 2 refills | Status: DC | PRN

## 2022-06-11 NOTE — Telephone Encounter (Signed)
Called patient and informed him Dr Terrace Arabia as work in [place] refilled his gabapentin. He stated he is on phone with Orthopedics to schedule an appointment. I advised he see ortho then have note sent to Dr Marjory Lies. He can then FU here as needed. Patient verbalized understanding, appreciation.

## 2022-06-11 NOTE — Telephone Encounter (Signed)
LVM at 11:39 am; Pt would like a call from the nurse to discuss a sooner appt with Dr. Marjory Lies. After discharged from the hospital 06/04/22 for bowel obstruction.  Now having numbness and feel like someone pinching on right hip.

## 2022-06-11 NOTE — Telephone Encounter (Signed)
PT's spouse visits today with forms to be filled out by Dr.Burns. Forms have been left in provider's folder and PT would like to be notified once it is sent out!  [phone]

## 2022-06-11 NOTE — Telephone Encounter (Signed)
Chart reviewed, patient was seen by Dr. Marjory Lies November 27, 2021 for neck and low back pain  EMG nerve conduction study in December 2022 showed possible right L5 chronic radiculopathy, mild right C6-7 cervical radiculopathy  Meds ordered this encounter  Medications   gabapentin (NEURONTIN) 300 MG capsule    Sig: Take 1 capsule (300 mg total) by mouth 3 (three) times daily as needed.    Dispense:  90 capsule    Refill:  2      May continue to see Dr. Marjory Lies

## 2022-06-11 NOTE — Telephone Encounter (Signed)
Called patient and advised he discuss new issues with PCP to rule out other possible causes. He stated he already has, was referred back to neuro. He stated he did have these issues when he saw Dr Marjory Lies. Made him aware Dr Marjory Lies is out of office until 7/12/2. He is going to call orthopedic to see if he can get refill on his gabapentin. He is asking if any other provider can see him here and/or prescribe something for him. I advised I'll send note to Dr Terrace Arabia as work in to see if she has any other recommendations and call him back. Patient verbalized understanding, appreciation.

## 2022-06-12 DIAGNOSIS — M545 Low back pain, unspecified: Secondary | ICD-10-CM | POA: Diagnosis not present

## 2022-06-12 DIAGNOSIS — M5459 Other low back pain: Secondary | ICD-10-CM | POA: Diagnosis not present

## 2022-06-12 NOTE — Telephone Encounter (Signed)
Form received and completed.  Form can not be sent out until Dr. [name] returns because they do not accept stamped signatures.  Patient has been made aware.

## 2022-06-21 DIAGNOSIS — M5416 Radiculopathy, lumbar region: Secondary | ICD-10-CM | POA: Diagnosis not present

## 2022-06-23 NOTE — Telephone Encounter (Signed)
Paperwork signed off on today and placed in mail back envelope.  Patient assistance placed in bin to be mailed out today.  Copy of paperwork made to keep on file.

## 2022-06-26 DIAGNOSIS — M5416 Radiculopathy, lumbar region: Secondary | ICD-10-CM | POA: Diagnosis not present

## 2022-06-27 ENCOUNTER — Other Ambulatory Visit (HOSPITAL_COMMUNITY): Payer: Self-pay

## 2022-07-31 DIAGNOSIS — M5416 Radiculopathy, lumbar region: Secondary | ICD-10-CM | POA: Diagnosis not present

## 2022-08-01 ENCOUNTER — Telehealth: Payer: Self-pay | Admitting: Internal Medicine

## 2022-08-01 NOTE — Telephone Encounter (Signed)
Pt is calling to see if he can receive any financial assistance for medical bills and medications, or discount. Pt has not worked in 2 years.CB- [id number]

## 2022-08-02 ENCOUNTER — Encounter: Payer: Self-pay | Admitting: Internal Medicine

## 2022-08-02 NOTE — Progress Notes (Unsigned)
Subjective:    Patient ID: [name], male    DOB: 06-Jun-1966, 56 y.o.   MRN: [id number]     HPI [name] is here for follow up of his chronic medical problems, including DM, hld, GERD, vit d def, vitamin B12 def, OSA   Admitted 6/24-28 for partial SBO and AKI - improved with conservative treatment  Still dealing with chronic neck and back pain - following with ortho.  Tdap due  Medications and allergies reviewed with patient and updated if appropriate.  Current Outpatient Medications on File Prior to Visit  Medication Sig Dispense Refill   Cholecalciferol (VITAMIN D) 50 MCG (2000 UT) CAPS Take 1 capsule by mouth daily.     gabapentin (NEURONTIN) 300 MG capsule Take 1-2 capsules by mouth at bedtime.     gabapentin (NEURONTIN) 300 MG capsule Take 1 capsule (300 mg total) by mouth 3 (three) times daily as needed. 90 capsule 2   JANUVIA 100 MG tablet Take 1 tablet (100 mg total) by mouth daily. 90 tablet 0   Lactobacillus-Inulin (CULTURELLE DIGESTIVE DAILY PO) Take 1 capsule by mouth daily.     loratadine (CLARITIN) 10 MG tablet Take 10 mg by mouth daily.     Multiple Vitamin (VITAMIN E/FOLIC ACID/B-6/B-12 PO) Take 1 tablet by mouth daily.     ondansetron (ZOFRAN) 4 MG tablet Take 1 tablet (4 mg total) by mouth every 6 (six) hours as needed for nausea. 20 tablet 0   pantoprazole (PROTONIX) 40 MG tablet TAKE 1 TABLET BY MOUTH EVERY DAY (Patient taking differently: 40 mg daily.) 90 tablet 1   polyethylene glycol powder (GLYCOLAX/MIRALAX) 17 GM/SCOOP powder Take 17 g by mouth daily. 238 g 0   rosuvastatin (CRESTOR) 5 MG tablet Take 1 tablet (5 mg total) by mouth daily. 90 tablet 1   simethicone (MYLICON) 80 MG chewable tablet Chew 1 tablet (80 mg total) by mouth 4 (four) times daily. 30 tablet 0   vitamin C (ASCORBIC ACID) 500 MG tablet Take 500 mg by mouth daily.     No current facility-administered medications on file prior to visit.     Review of Systems      Objective:  There were no vitals filed for this visit. BP Readings from Last 3 Encounters:  06/04/22 109/77  05/08/22 126/80  05/02/22 116/64   Wt Readings from Last 3 Encounters:  05/31/22 212 lb 8.4 oz (96.4 kg)  05/08/22 212 lb 3.2 oz (96.3 kg)  05/02/22 209 lb (94.8 kg)   There is no height or weight on file to calculate BMI.    Physical Exam     Lab Results  Component Value Date   WBC 5.6 06/04/2022   HGB 14.2 06/04/2022   HCT 41.8 06/04/2022   PLT 182 06/04/2022   GLUCOSE 87 06/04/2022   CHOL 93 05/02/2022   TRIG 125 05/02/2022   HDL 36 (L) 05/02/2022   LDLDIRECT 64.0 09/30/2016   LDLCALC 36 05/02/2022   ALT 42 05/31/2022   AST 32 05/31/2022   NA 136 06/04/2022   K 3.9 06/04/2022   CL 105 06/04/2022   CREATININE 0.87 06/04/2022   BUN 9 06/04/2022   CO2 18 (L) 06/04/2022   TSH 0.92 05/02/2022   PSA 0.34 06/04/2014   INR 1.23 12/01/2009   HGBA1C 6.3 (H) 05/02/2022   MICROALBUR 1.7 09/17/2021     Assessment & Plan:    See Problem List for Assessment and Plan of chronic medical problems.

## 2022-08-02 NOTE — Patient Instructions (Addendum)
You received a tetanus and flu vaccine today   Blood work was ordered.     Medications changes include :   triamcinolone cream ( steroid cream ) for leg    Return in about 6 months (around 02/04/2023) for follow up.   Health Maintenance, Male Adopting a healthy lifestyle and getting preventive care are important in promoting health and wellness. Ask your health care provider about: The right schedule for you to have regular tests and exams. Things you can do on your own to prevent diseases and keep yourself healthy. What should I know about diet, weight, and exercise? Eat a healthy diet  Eat a diet that includes plenty of vegetables, fruits, low-fat dairy products, and lean protein. Do not eat a lot of foods that are high in solid fats, added sugars, or sodium. Maintain a healthy weight Body mass index (BMI) is a measurement that can be used to identify possible weight problems. It estimates body fat based on height and weight. Your health care provider can help determine your BMI and help you achieve or maintain a healthy weight. Get regular exercise Get regular exercise. This is one of the most important things you can do for your health. Most adults should: Exercise for at least 150 minutes each week. The exercise should increase your heart rate and make you sweat (moderate-intensity exercise). Do strengthening exercises at least twice a week. This is in addition to the moderate-intensity exercise. Spend less time sitting. Even light physical activity can be beneficial. Watch cholesterol and blood lipids Have your blood tested for lipids and cholesterol at 54 years of age, then have this test every 5 years. You may need to have your cholesterol levels checked more often if: Your lipid or cholesterol levels are high. You are older than 54 years of age. You are at high risk for heart disease. What should I know about cancer screening? Many types of cancers can be detected  early and may often be prevented. Depending on your health history and family history, you may need to have cancer screening at various ages. This may include screening for: Colorectal cancer. Prostate cancer. Skin cancer. Lung cancer. What should I know about heart disease, diabetes, and high blood pressure? Blood pressure and heart disease High blood pressure causes heart disease and increases the risk of stroke. This is more likely to develop in people who have high blood pressure readings or are overweight. Talk with your health care provider about your target blood pressure readings. Have your blood pressure checked: Every 3-5 years if you are 50-40 years of age. Every year if you are 54 years old or older. If you are between the ages of 104 and 7 and are a current or former smoker, ask your health care provider if you should have a one-time screening for abdominal aortic aneurysm (AAA). Diabetes Have regular diabetes screenings. This checks your fasting blood sugar level. Have the screening done: Once every three years after age 44 if you are at a normal weight and have a low risk for diabetes. More often and at a younger age if you are overweight or have a high risk for diabetes. What should I know about preventing infection? Hepatitis B If you have a higher risk for hepatitis B, you should be screened for this virus. Talk with your health care provider to find out if you are at risk for hepatitis B infection. Hepatitis C Blood testing is recommended for: Everyone born from [birth year] through  61. Anyone with known risk factors for hepatitis C. Sexually transmitted infections (STIs) You should be screened each year for STIs, including gonorrhea and chlamydia, if: You are sexually active and are younger than 54 years of age. You are older than 54 years of age and your health care provider tells you that you are at risk for this type of infection. Your sexual activity has changed since  you were last screened, and you are at increased risk for chlamydia or gonorrhea. Ask your health care provider if you are at risk. Ask your health care provider about whether you are at high risk for HIV. Your health care provider may recommend a prescription medicine to help prevent HIV infection. If you choose to take medicine to prevent HIV, you should first get tested for HIV. You should then be tested every 3 months for as long as you are taking the medicine. Follow these instructions at home: Alcohol use Do not drink alcohol if your health care provider tells you not to drink. If you drink alcohol: Limit how much you have to 0-2 drinks a day. Know how much alcohol is in your drink. In the [place] one drink equals one 12 oz bottle of beer (355 mL), one 5 oz glass of wine (148 mL), or one 1 oz glass of hard liquor (44 mL). Lifestyle Do not use any products that contain nicotine or tobacco. These products include cigarettes, chewing tobacco, and vaping devices, such as e-cigarettes. If you need help quitting, ask your health care provider. Do not use street drugs. Do not share needles. Ask your health care provider for help if you need support or information about quitting drugs. General instructions Schedule regular health, dental, and eye exams. Stay current with your vaccines. Tell your health care provider if: You often feel depressed. You have ever been abused or do not feel safe at home. Summary Adopting a healthy lifestyle and getting preventive care are important in promoting health and wellness. Follow your health care provider's instructions about healthy diet, exercising, and getting tested or screened for diseases. Follow your health care provider's instructions on monitoring your cholesterol and blood pressure. This information is not intended to replace advice given to you by your health care provider. Make sure you discuss any questions you have with your health care  provider. Document Revised: 04/15/2021 Document Reviewed: 04/15/2021 Elsevier Patient Education  2023 [employer].

## 2022-08-04 ENCOUNTER — Encounter: Payer: Self-pay | Admitting: Internal Medicine

## 2022-08-04 ENCOUNTER — Ambulatory Visit (INDEPENDENT_AMBULATORY_CARE_PROVIDER_SITE_OTHER): Payer: BC Managed Care – PPO | Admitting: Internal Medicine

## 2022-08-04 VITALS — BP 112/80 | HR 70 | Temp 97.8°F | Wt 212.0 lb

## 2022-08-04 DIAGNOSIS — L309 Dermatitis, unspecified: Secondary | ICD-10-CM | POA: Insufficient documentation

## 2022-08-04 DIAGNOSIS — K219 Gastro-esophageal reflux disease without esophagitis: Secondary | ICD-10-CM | POA: Diagnosis not present

## 2022-08-04 DIAGNOSIS — K5909 Other constipation: Secondary | ICD-10-CM

## 2022-08-04 DIAGNOSIS — E559 Vitamin D deficiency, unspecified: Secondary | ICD-10-CM | POA: Diagnosis not present

## 2022-08-04 DIAGNOSIS — Z0000 Encounter for general adult medical examination without abnormal findings: Secondary | ICD-10-CM | POA: Diagnosis not present

## 2022-08-04 DIAGNOSIS — E782 Mixed hyperlipidemia: Secondary | ICD-10-CM

## 2022-08-04 DIAGNOSIS — E538 Deficiency of other specified B group vitamins: Secondary | ICD-10-CM

## 2022-08-04 DIAGNOSIS — E119 Type 2 diabetes mellitus without complications: Secondary | ICD-10-CM | POA: Diagnosis not present

## 2022-08-04 DIAGNOSIS — Z23 Encounter for immunization: Secondary | ICD-10-CM

## 2022-08-04 DIAGNOSIS — G4733 Obstructive sleep apnea (adult) (pediatric): Secondary | ICD-10-CM

## 2022-08-04 DIAGNOSIS — R21 Rash and other nonspecific skin eruption: Secondary | ICD-10-CM | POA: Insufficient documentation

## 2022-08-04 DIAGNOSIS — K56609 Unspecified intestinal obstruction, unspecified as to partial versus complete obstruction: Secondary | ICD-10-CM

## 2022-08-04 LAB — COMPREHENSIVE METABOLIC PANEL
ALT: 29 U/L (ref 0–53)
AST: 22 U/L (ref 0–37)
Albumin: 4.6 g/dL (ref 3.5–5.2)
Alkaline Phosphatase: 59 U/L (ref 39–117)
BUN: 12 mg/dL (ref 6–23)
CO2: 26 mEq/L (ref 19–32)
Calcium: 10.1 mg/dL (ref 8.4–10.5)
Chloride: 99 mEq/L (ref 96–112)
Creatinine, Ser: 1.02 mg/dL (ref 0.40–1.50)
GFR: 83.24 mL/min (ref >60.00)
Glucose, Bld: 109 mg/dL — ABNORMAL HIGH (ref 70–99)
Potassium: 3.9 mEq/L (ref 3.5–5.1)
Sodium: 133 mEq/L — ABNORMAL LOW (ref 135–145)
Total Bilirubin: 1 mg/dL (ref 0.2–1.2)
Total Protein: 7.9 g/dL (ref 6.0–8.3)

## 2022-08-04 LAB — LIPID PANEL
Cholesterol: 103 mg/dL (ref 0–200)
HDL: 34 mg/dL — ABNORMAL LOW (ref >39.00)
LDL Cholesterol: 40 mg/dL (ref 0–99)
NonHDL: 68.54
Total CHOL/HDL Ratio: 3
Triglycerides: 142 mg/dL (ref 0.0–149.0)
VLDL: 28.4 mg/dL (ref 0.0–40.0)

## 2022-08-04 LAB — VITAMIN B12: Vitamin B-12: 500 pg/mL (ref 211–911)

## 2022-08-04 MED ORDER — TRIAMCINOLONE ACETONIDE 0.1 % EX CREA: 1.0000 | TOPICAL_CREAM | Freq: Two times a day (BID) | CUTANEOUS | 0 refills | Status: DC | PRN

## 2022-08-04 NOTE — Assessment & Plan Note (Signed)
Chronic Has not started with cpap Will call to see if he can get the machine

## 2022-08-04 NOTE — Assessment & Plan Note (Signed)
Recent hospitalization for partial SBO Successfully treated with conservative measures-he has had this multiple times in the past Mild residual tenderness diffusely on exam-improving slowly Continue MiraLAX daily for constipation

## 2022-08-04 NOTE — Telephone Encounter (Signed)
Will mail packet to address on file.

## 2022-08-04 NOTE — Assessment & Plan Note (Signed)
Chronic Having GERD flare that may last a couple of days once a week Continue pantoprazole 40 mg daily and over-the-counter antacid as needed He is consuming a lot of ibuprofen-advised that he needs to decrease this-ideally discontinue it because this is likely contributing to his GERD

## 2022-08-04 NOTE — Progress Notes (Signed)
Subjective:    Patient ID: [name], male    DOB: Jan 15, 1966, 56 y.o.   MRN: [id number]     HPI [name] is here for a physical exam and follow up of his chronic medical problems, including DM, hld, GERD, vit d def, vitamin B12 def, OSA   Admitted 6/24-28 for partial SBO and AKI - improved with conservative treatment.  His abdominal pain is improving.  Still dealing with chronic neck and back pain - following with ortho.  Had two injections last week in his back.    He has been having right upper leg pain - there all the time.  He has numbness and intermittent sharp pain.  He was told by ortho the injections would hopefully calm down the pain.    He states he does lay around in bed a lot.  He is not exercising.    Medications and allergies reviewed with patient and updated if appropriate.  Current Outpatient Medications on File Prior to Visit  Medication Sig Dispense Refill   Cholecalciferol (VITAMIN D) 50 MCG (2000 UT) CAPS Take 1 capsule by mouth daily.     famotidine (PEPCID) 40 MG tablet TAKE 1 TABLET BY MOUTH EVERYDAY AT BEDTIME     gabapentin (NEURONTIN) 300 MG capsule Take 1-2 capsules by mouth at bedtime.     gabapentin (NEURONTIN) 300 MG capsule Take 1 capsule (300 mg total) by mouth 3 (three) times daily as needed. 90 capsule 2   JANUVIA 100 MG tablet Take 1 tablet (100 mg total) by mouth daily. 90 tablet 0   Lactobacillus-Inulin (CULTURELLE DIGESTIVE DAILY PO) Take 1 capsule by mouth daily.     loratadine (CLARITIN) 10 MG tablet Take 10 mg by mouth daily.     Multiple Vitamin (VITAMIN E/FOLIC ACID/B-6/B-12 PO) Take 1 tablet by mouth daily.     ondansetron (ZOFRAN) 4 MG tablet Take 1 tablet (4 mg total) by mouth every 6 (six) hours as needed for nausea. 20 tablet 0   pantoprazole (PROTONIX) 40 MG tablet TAKE 1 TABLET BY MOUTH EVERY DAY (Patient taking differently: 40 mg daily.) 90 tablet 1   polyethylene glycol powder (GLYCOLAX/MIRALAX) 17 GM/SCOOP powder Take 17  g by mouth daily. 238 g 0   rosuvastatin (CRESTOR) 5 MG tablet Take 1 tablet (5 mg total) by mouth daily. 90 tablet 1   simethicone (MYLICON) 80 MG chewable tablet Chew 1 tablet (80 mg total) by mouth 4 (four) times daily. 30 tablet 0   vitamin C (ASCORBIC ACID) 500 MG tablet Take 500 mg by mouth daily.     No current facility-administered medications on file prior to visit.    Review of Systems  Constitutional:  Negative for chills and fever.  Eyes:  Negative for visual disturbance.  Respiratory:  Negative for cough, shortness of breath and wheezing.   Cardiovascular:  Negative for chest pain, palpitations and leg swelling.  Gastrointestinal:  Positive for constipation (takes miralax daily). Negative for abdominal pain, blood in stool, diarrhea and nausea.       GERD once a week for a couple of days  Genitourinary:  Negative for dysuria and hematuria.  Musculoskeletal:  Positive for arthralgias and back pain.  Skin:  Negative for rash.       Itchy at left lateral ankle  Neurological:  Negative for light-headedness and headaches.  Psychiatric/Behavioral:  Positive for sleep disturbance (from pain). Negative for dysphoric mood. The patient is not nervous/anxious.  Objective:   Vitals:   08/04/22 1428  BP: 112/80  Pulse: 70  Temp: 97.8 F (36.6 C)  SpO2: 96%   Filed Weights   08/04/22 1428  Weight: 212 lb (96.2 kg)   Body mass index is 27.97 kg/m.  BP Readings from Last 3 Encounters:  08/04/22 112/80  06/04/22 109/77  05/08/22 126/80    Wt Readings from Last 3 Encounters:  08/04/22 212 lb (96.2 kg)  05/31/22 212 lb 8.4 oz (96.4 kg)  05/08/22 212 lb 3.2 oz (96.3 kg)      Physical Exam Constitutional: He appears well-developed and well-nourished. No distress.  He is wearing a neck brace and a back brace HENT:  Head: Normocephalic and atraumatic.  Right Ear: External ear normal.  Left Ear: External ear normal.  Mouth/Throat: Oropharynx is clear and moist.   Normal ear canals and TM b/l with minimal cerumen bilaterally Eyes: Conjunctivae and EOM are normal.  Neck: Neck supple. No tracheal deviation present. No thyromegaly present.  Cardiovascular: Normal rate, regular rhythm, normal heart sounds and intact distal pulses.   No murmur heard. Pulmonary/Chest: Effort normal and breath sounds normal. No respiratory distress. He has no wheezes. He has no rales.  Abdominal: Soft. He exhibits no distension. There is Minimal diffuse tenderness.  No rebound or guarding Genitourinary: deferred  Musculoskeletal: He exhibits no edema.  Lymphadenopathy:   He has no cervical adenopathy.  Skin: Skin is warm and dry. He is not diaphoretic.  Scratch marks left lateral lower leg with mild surround erythema - no open wounds or discharge Psychiatric: He has a normal mood and affect. His behavior is normal.    Diabetic Foot Exam - Simple   Simple Foot Form Diabetic Foot exam was performed with the following findings: Yes   Visual Inspection No deformities, no ulcerations, no other skin breakdown bilaterally: Yes Sensation Testing Intact to touch and monofilament testing bilaterally: Yes Pulse Check Posterior Tibialis and Dorsalis pulse intact bilaterally: Yes Comments         Assessment & Plan:   Physical exam: Screening blood work  ordered Exercise   none-stressed that laying in bed is not good for his neck pain or back pain and that he needs to start walking more.  He will be starting physical therapy soon and that may help Weight okay-mildly overweight Substance abuse   none   Reviewed recommended immunizations.  Tdap and flu vaccines today   Health Maintenance  Topic Date Due   COVID-19 Vaccine (1) Never done   TETANUS/TDAP  04/16/2021   FOOT EXAM  05/30/2021   INFLUENZA VACCINE  07/08/2022   Diabetic kidney evaluation - Urine ACR  09/17/2022   HEMOGLOBIN A1C  11/02/2022   OPHTHALMOLOGY EXAM  11/28/2022   Diabetic kidney evaluation - GFR  measurement  06/05/2023   COLONOSCOPY (Pts 45-69yrs Insurance coverage will need to be confirmed)  10/28/2028   Hepatitis C Screening  Completed   HIV Screening  Completed   HPV VACCINES  Aged Out   Zoster Vaccines- Shingrix  Discontinued     See Problem List for Assessment and Plan of chronic medical problems.

## 2022-08-04 NOTE — Assessment & Plan Note (Signed)
Chronic Continue vitamin D

## 2022-08-04 NOTE — Assessment & Plan Note (Signed)
Chronic Check lipid panel, CMP Continue Crestor 5 mg daily Encourage increase exercise

## 2022-08-04 NOTE — Assessment & Plan Note (Signed)
Chronic Continue B12 supplementation Check B12 level 

## 2022-08-04 NOTE — Assessment & Plan Note (Signed)
Acute Rash on left lateral ankle It is very itchy in the markings look like it is from scratching-mild erythema surrounding Triamcinolone 0.1% cream twice daily as needed

## 2022-08-04 NOTE — Assessment & Plan Note (Signed)
Chronic Taking MiraLAX daily which is controlling the constipation

## 2022-08-04 NOTE — Assessment & Plan Note (Signed)
Chronic Sugars have been controlled Check A1c Continue Januvia 100 mg daily, which has been effective and well-tolerated

## 2022-08-05 LAB — HEMOGLOBIN A1C: Hgb A1c MFr Bld: 6.7 % — ABNORMAL HIGH (ref 4.6–6.5)

## 2022-08-14 DIAGNOSIS — M5416 Radiculopathy, lumbar region: Secondary | ICD-10-CM | POA: Diagnosis not present

## 2022-08-14 DIAGNOSIS — M5451 Vertebrogenic low back pain: Secondary | ICD-10-CM | POA: Diagnosis not present

## 2022-08-18 DIAGNOSIS — M5451 Vertebrogenic low back pain: Secondary | ICD-10-CM | POA: Diagnosis not present

## 2022-08-18 DIAGNOSIS — M5416 Radiculopathy, lumbar region: Secondary | ICD-10-CM | POA: Diagnosis not present

## 2022-08-20 DIAGNOSIS — M5451 Vertebrogenic low back pain: Secondary | ICD-10-CM | POA: Diagnosis not present

## 2022-08-20 DIAGNOSIS — M5416 Radiculopathy, lumbar region: Secondary | ICD-10-CM | POA: Diagnosis not present

## 2022-08-25 DIAGNOSIS — M5416 Radiculopathy, lumbar region: Secondary | ICD-10-CM | POA: Diagnosis not present

## 2022-08-25 DIAGNOSIS — M5451 Vertebrogenic low back pain: Secondary | ICD-10-CM | POA: Diagnosis not present

## 2022-08-28 DIAGNOSIS — M5416 Radiculopathy, lumbar region: Secondary | ICD-10-CM | POA: Diagnosis not present

## 2022-08-28 DIAGNOSIS — M5451 Vertebrogenic low back pain: Secondary | ICD-10-CM | POA: Diagnosis not present

## 2022-08-29 ENCOUNTER — Telehealth: Payer: Self-pay | Admitting: Cardiology

## 2022-08-29 NOTE — Telephone Encounter (Signed)
Patient is requesting to switch providers from Dr. Harrell Gave to Dr. Harriet Masson due to location convenience.  Please advise, Thank you

## 2022-09-02 DIAGNOSIS — M5451 Vertebrogenic low back pain: Secondary | ICD-10-CM | POA: Diagnosis not present

## 2022-09-02 DIAGNOSIS — M5416 Radiculopathy, lumbar region: Secondary | ICD-10-CM | POA: Diagnosis not present

## 2022-09-04 DIAGNOSIS — M5451 Vertebrogenic low back pain: Secondary | ICD-10-CM | POA: Diagnosis not present

## 2022-09-04 DIAGNOSIS — M5412 Radiculopathy, cervical region: Secondary | ICD-10-CM | POA: Diagnosis not present

## 2022-09-04 DIAGNOSIS — M5416 Radiculopathy, lumbar region: Secondary | ICD-10-CM | POA: Diagnosis not present

## 2022-09-08 DIAGNOSIS — M5451 Vertebrogenic low back pain: Secondary | ICD-10-CM | POA: Diagnosis not present

## 2022-09-08 DIAGNOSIS — M5416 Radiculopathy, lumbar region: Secondary | ICD-10-CM | POA: Diagnosis not present

## 2022-09-11 DIAGNOSIS — M5416 Radiculopathy, lumbar region: Secondary | ICD-10-CM | POA: Diagnosis not present

## 2022-09-11 DIAGNOSIS — M5451 Vertebrogenic low back pain: Secondary | ICD-10-CM | POA: Diagnosis not present

## 2022-09-23 ENCOUNTER — Encounter: Payer: Self-pay | Admitting: Cardiology

## 2022-09-23 ENCOUNTER — Ambulatory Visit: Payer: BC Managed Care – PPO | Admitting: Cardiology

## 2022-09-30 ENCOUNTER — Ambulatory Visit: Payer: BC Managed Care – PPO | Attending: Cardiology | Admitting: Cardiology

## 2022-09-30 ENCOUNTER — Encounter: Payer: Self-pay | Admitting: Cardiology

## 2022-09-30 VITALS — BP 118/72 | HR 78 | Ht 73.0 in | Wt 214.8 lb

## 2022-09-30 DIAGNOSIS — E782 Mixed hyperlipidemia: Secondary | ICD-10-CM | POA: Diagnosis not present

## 2022-09-30 DIAGNOSIS — I2510 Atherosclerotic heart disease of native coronary artery without angina pectoris: Secondary | ICD-10-CM | POA: Insufficient documentation

## 2022-09-30 MED ORDER — ROSUVASTATIN CALCIUM 5 MG PO TABS: 5.0000 mg | ORAL_TABLET | Freq: Every day | ORAL | 3 refills | Status: DC

## 2022-09-30 NOTE — Patient Instructions (Signed)
Medication Instructions:  Your physician recommends that you continue on your current medications as directed. Please refer to the Current Medication list given to you today.  *If you need a refill on your cardiac medications before your next appointment, please call your pharmacy*   Lab Work: NONE If you have labs (blood work) drawn today and your tests are completely normal, you will receive your results only by: MyChart Message (if you have MyChart) OR A paper copy in the mail If you have any lab test that is abnormal or we need to change your treatment, we will call you to review the results.   Testing/Procedures: NONE   Follow-Up: At [facility], you and your health needs are our priority.  As part of our continuing mission to provide you with exceptional heart care, we have created designated Provider Care Teams.  These Care Teams include your primary Cardiologist (physician) and Advanced Practice Providers (APPs -  Physician Assistants and Nurse Practitioners) who all work together to provide you with the care you need, when you need it.  We recommend signing up for the patient portal called "MyChart".  Sign up information is provided on this After Visit Summary.  MyChart is used to connect with patients for Virtual Visits (Telemedicine).  Patients are able to view lab/test results, encounter notes, upcoming appointments, etc.  Non-urgent messages can be sent to your provider as well.   To learn more about what you can do with MyChart, go to [email or web address].    Your next appointment:   1 year(s)  The format for your next appointment:   In Person  Provider:   Kardie Tobb, DO   

## 2022-09-30 NOTE — Progress Notes (Signed)
Cardiology Office Note:    Date:  09/30/2022   ID:  [name], DOB 1966/05/19, MRN [id number]  PCP:  [name], MD  Cardiologist:  Thomasene Ripple, DO  Electrophysiologist:  None   Referring MD: [name], MD   " I am doing fine    History of Present Illness:    [name] is a 56 y.o. male with a hx of minimal coronary artery disease seen on coronary CTA, OSA, diabetes mellitus, GERD here today for follow-up visit.  The patient previously followed with Dr. Cristal Deer but had requested to switch providers due to not [place].  Today he offers no complaints.  He tells me he is here for his annual follow-up.  He denies any chest pain or shortness of breath.  Past Medical History:  Diagnosis Date   Allergy    Chest wall pain    Cough    Diabetes (HCC)    Diarrhea recurrent   GERD (gastroesophageal reflux disease)    Headache(784.0)    Irritable bowel syndrome    Obstructive sleep apnea    apnealink 10/23/10 AHI 5, Sp02 low 87%   Postural lightheadedness    SBO (small bowel obstruction) (HCC) 12/2015   Sleep apnea    11/27/21 will get new test   Small bowel obstruction [facility])     Past Surgical History:  Procedure Laterality Date   CHOLECYSTECTOMY  2009   COLECTOMY  1994   segmental   COLONOSCOPY  07/17/2011   hemorrhoids, otherwise normal including terminal ileum and random colon biopises   GASTROSTOMY  2005   stamm   LAPAROTOMY  2002, 2005, 2009   SPHINCTEROTOMY  2009   lateral internal   UPPER GASTROINTESTINAL ENDOSCOPY  07/17/2011   normal, including duodenal biopsies    Current Medications: Current Meds  Medication Sig   Cholecalciferol (VITAMIN D) 50 MCG (2000 UT) CAPS Take 1 capsule by mouth daily.   gabapentin (NEURONTIN) 300 MG capsule Take 1 capsule (300 mg total) by mouth 3 (three) times daily as needed.   JANUVIA 100 MG tablet Take 1 tablet (100 mg total) by mouth daily.   Lactobacillus-Inulin (CULTURELLE  DIGESTIVE DAILY PO) Take 1 capsule by mouth daily.   loratadine (CLARITIN) 10 MG tablet Take 10 mg by mouth daily.   Multiple Vitamin (VITAMIN E/FOLIC ACID/B-6/B-12 PO) Take 1 tablet by mouth daily.   pantoprazole (PROTONIX) 40 MG tablet TAKE 1 TABLET BY MOUTH EVERY DAY (Patient taking differently: 40 mg daily.)   polyethylene glycol powder (GLYCOLAX/MIRALAX) 17 GM/SCOOP powder Take 17 g by mouth daily.   vitamin C (ASCORBIC ACID) 500 MG tablet Take 500 mg by mouth daily.   [DISCONTINUED] rosuvastatin (CRESTOR) 5 MG tablet Take 5 mg by mouth daily.     Allergies:   Metformin and related   Social History   Socioeconomic History   Marital status: Married    Spouse name: Not on file   Number of children: 4   Years of education: Not on file   Highest education level: Associate degree: occupational, Scientist, product/process development, or vocational program  Occupational History   Occupation: FORK LIFT DRIVER    Employer: [employer]    Comment: 11/27/21 out of work  Tobacco Use   Smoking status: Never   Smokeless tobacco: Never  Vaping Use   Vaping Use: Never used  Substance and Sexual Activity   Alcohol use: Never   Drug use: Never  Sexual activity: Not on file  Other Topics Concern   Not on file  Social History Narrative   Occupation: Group Lead Scientist, research (physical sciences) (deliveries)   Patient has never smoked.   Alcohol Use-no   Illicit Drug Use-no   Married-Divorced-Married again 2009-    3 children 2 sons and 1 daughter   [place]ian heritage   No regular exercise   Social Determinants of Health   Financial Resource Strain: Not on file  Food Insecurity: Not on file  Transportation Needs: Not on file  Physical Activity: Not on file  Stress: Not on file  Social Connections: Not on file     Family History: The patient's family history includes Hypertension in his mother; Liver disease in his father. There is no history of Colon cancer, Esophageal cancer, Rectal cancer, or Stomach  cancer.  ROS:   Review of Systems  Constitution: Negative for decreased appetite, fever and weight gain.  HENT: Negative for congestion, ear discharge, hoarse voice and sore throat.   Eyes: Negative for discharge, redness, vision loss in right eye and visual halos.  Cardiovascular: Negative for chest pain, dyspnea on exertion, leg swelling, orthopnea and palpitations.  Respiratory: Negative for cough, hemoptysis, shortness of breath and snoring.   Endocrine: Negative for heat intolerance and polyphagia.  Hematologic/Lymphatic: Negative for bleeding problem. Does not bruise/bleed easily.  Skin: Negative for flushing, nail changes, rash and suspicious lesions.  Musculoskeletal: Negative for arthritis, joint pain, muscle cramps, myalgias, neck pain and stiffness.  Gastrointestinal: Negative for abdominal pain, bowel incontinence, diarrhea and excessive appetite.  Genitourinary: Negative for decreased libido, genital sores and incomplete emptying.  Neurological: Negative for brief paralysis, focal weakness, headaches and loss of balance.  Psychiatric/Behavioral: Negative for altered mental status, depression and suicidal ideas.  Allergic/Immunologic: Negative for HIV exposure and persistent infections.    EKGs/Labs/Other Studies Reviewed:    The following studies were reviewed today:   EKG: None today  The following studies were reviewed today: CT cardiac 04/18/21 IMPRESSION: 1. Coronary calcium score of 6.48. This was 73rd percentile for age-, race-, and sex-matched controls.   2. Normal coronary origin with right dominance.   3. Minimal calcified plaque in the mid RCA.  CAD-RADS 1.   Echo 2012: - Left ventricle: The cavity size was normal. Wall thickness was    increased in a pattern of mild LVH. Systolic function was normal.    The estimated ejection fraction was in the range of 55% to 60%.    Indeterminant diastolic function. Wall motion was normal; there    were no regional  wall motion abnormalities.  - Aortic valve: There was no stenosis. Trivial regurgitation.  - Aorta: Mild aortic root dilation. Aortic root dimension: 11mm    (ED).  - Mitral valve: Trivial regurgitation.  - Left atrium: The atrium was at the upper limits of normal in size.  - Right ventricle: The cavity size was normal. Systolic function was    normal.  - Pulmonary arteries: PA peak pressure: 2mm Hg (S).  - Inferior vena cava: The vessel was normal in size; the    respirophasic diameter changes were in the normal range (= 50%);    findings are consistent with normal central venous pressure.  Impressions:   - Normal LV size with mild LV hypertrophy. EF 55-60%. Indeterminant    diastolic function. Normal RV size and systolic function. No    significant valvular dysfunction.    ETT 03/11/2011 9.5 METs, 7:37, no symptoms, achieved 98% MPHR.  Baseline RBBB, no ST changes, no evidence of ischemia.  Recent Labs: 05/02/2022: TSH 0.92 06/04/2022: Hemoglobin 14.2; Magnesium 1.8; Platelets 182 08/04/2022: ALT 29; BUN 12; Creatinine, Ser 1.02; Potassium 3.9; Sodium 133  Recent Lipid Panel    Component Value Date/Time   CHOL 103 08/04/2022 1530   CHOL 87 (L) 09/23/2021 1510   TRIG 142.0 08/04/2022 1530   HDL 34.00 (L) 08/04/2022 1530   HDL 33 (L) 09/23/2021 1510   CHOLHDL 3 08/04/2022 1530   VLDL 28.4 08/04/2022 1530   LDLCALC 40 08/04/2022 1530   LDLCALC 36 05/02/2022 1634   LDLDIRECT 64.0 09/30/2016 0901    Physical Exam:    VS:  BP 118/72   Pulse 78   Ht 6\' 1"  (1.854 m)   Wt 214 lb 12.8 oz (97.4 kg)   SpO2 97%   BMI 28.34 kg/m     Wt Readings from Last 3 Encounters:  09/30/22 214 lb 12.8 oz (97.4 kg)  08/04/22 212 lb (96.2 kg)  05/31/22 212 lb 8.4 oz (96.4 kg)     GEN: Well nourished, well developed in no acute distress HEENT: Normal NECK: No JVD; No carotid bruits LYMPHATICS: No lymphadenopathy CARDIAC: S1S2 noted,RRR, no murmurs, rubs, gallops RESPIRATORY:  Clear to  auscultation without rales, wheezing or rhonchi  ABDOMEN: Soft, non-tender, non-distended, +bowel sounds, no guarding. EXTREMITIES: No edema, No cyanosis, no clubbing MUSCULOSKELETAL:  No deformity  SKIN: Warm and dry NEUROLOGIC:  Alert and oriented x 3, non-focal PSYCHIATRIC:  Normal affect, good insight  ASSESSMENT:    1. Mixed hyperlipidemia   2. Minimal Nonobstructive CAD    PLAN:     1.  Coronary artery disease-this was minimal no anginal symptoms. 2.  Hyperlipidemia - continue with current statin medication.  We will get lipid profile as well today.  The patient is in agreement with the above plan. The patient left the office in stable condition.  The patient will follow up in 1 year or sooner if needed.   Medication Adjustments/Labs and Tests Ordered: Current medicines are reviewed at length with the patient today.  Concerns regarding medicines are outlined above.  No orders of the defined types were placed in this encounter.  Meds ordered this encounter  Medications   rosuvastatin (CRESTOR) 5 MG tablet    Sig: Take 1 tablet (5 mg total) by mouth daily.    Dispense:  90 tablet    Refill:  3    Patient Instructions  Medication Instructions:  Your physician recommends that you continue on your current medications as directed. Please refer to the Current Medication list given to you today.  *If you need a refill on your cardiac medications before your next appointment, please call your pharmacy*   Lab Work: NONE If you have labs (blood work) drawn today and your tests are completely normal, you will receive your results only by: [facility] (if you have MyChart) OR A paper copy in the mail If you have any lab test that is abnormal or we need to change your treatment, we will call you to review the results.   Testing/Procedures: NONE   Follow-Up: At [facility], you and your health needs are our priority.  As part of our continuing mission to  provide you with exceptional heart care, we have created designated Provider Care Teams.  These Care Teams include your primary Cardiologist (physician) and Advanced Practice Providers (APPs -  Physician Assistants and Nurse Practitioners) who all work together to provide you  with the care you need, when you need it.  We recommend signing up for the patient portal called "MyChart".  Sign up information is provided on this After Visit Summary.  MyChart is used to connect with patients for Virtual Visits (Telemedicine).  Patients are able to view lab/test results, encounter notes, upcoming appointments, etc.  Non-urgent messages can be sent to your provider as well.   To learn more about what you can do with MyChart, go to [email or web address].    Your next appointment:   1 year(s)  The format for your next appointment:   In Person  Provider:   Thomasene Ripple, DO     Adopting a Healthy Lifestyle.  Know what a healthy weight is for you (roughly BMI <25) and aim to maintain this   Aim for 7+ servings of fruits and vegetables daily   65-80+ fluid ounces of water or unsweet tea for healthy kidneys   Limit to max 1 drink of alcohol per day; avoid smoking/tobacco   Limit animal fats in diet for cholesterol and heart health - choose grass fed whenever available   Avoid highly processed foods, and foods high in saturated/trans fats   Aim for low stress - take time to unwind and care for your mental health   Aim for 150 min of moderate intensity exercise weekly for heart health, and weights twice weekly for bone health   Aim for 7-9 hours of sleep daily   When it comes to diets, agreement about the perfect plan isnt easy to find, even among the experts. Experts at the [facility] [employer] developed an idea known as the Healthy Eating Plate. Just imagine a plate divided into logical, healthy portions.   The emphasis is on diet quality:   Load up on vegetables and fruits -  one-half of your plate: Aim for color and variety, and remember that potatoes dont count.   Go for whole grains - one-quarter of your plate: Whole wheat, barley, wheat berries, quinoa, oats, brown rice, and foods made with them. If you want pasta, go with whole wheat pasta.   Protein power - one-quarter of your plate: Fish, chicken, beans, and nuts are all healthy, versatile protein sources. Limit red meat.   The diet, however, does go beyond the plate, offering a few other suggestions.   Use healthy plant oils, such as olive, canola, soy, corn, sunflower and peanut. Check the labels, and avoid partially hydrogenated oil, which have unhealthy trans fats.   If youre thirsty, drink water. Coffee and tea are good in moderation, but skip sugary drinks and limit milk and dairy products to one or two daily servings.   The type of carbohydrate in the diet is more important than the amount. Some sources of carbohydrates, such as vegetables, fruits, whole grains, and beans-are healthier than others.   Finally, stay active  Signed, Thomasene Ripple, DO  09/30/2022 4:17 PM    [facility]

## 2022-10-06 ENCOUNTER — Other Ambulatory Visit: Payer: BC Managed Care – PPO

## 2022-10-07 ENCOUNTER — Other Ambulatory Visit: Payer: Self-pay | Admitting: Internal Medicine

## 2022-10-07 DIAGNOSIS — L309 Dermatitis, unspecified: Secondary | ICD-10-CM | POA: Diagnosis not present

## 2022-10-07 DIAGNOSIS — L0889 Other specified local infections of the skin and subcutaneous tissue: Secondary | ICD-10-CM | POA: Diagnosis not present

## 2022-10-08 DIAGNOSIS — Z6829 Body mass index (BMI) 29.0-29.9, adult: Secondary | ICD-10-CM | POA: Diagnosis not present

## 2022-10-08 DIAGNOSIS — M5136 Other intervertebral disc degeneration, lumbar region: Secondary | ICD-10-CM | POA: Diagnosis not present

## 2022-10-22 ENCOUNTER — Ambulatory Visit
Admission: RE | Admit: 2022-10-22 | Discharge: 2022-10-22 | Disposition: A | Discharge status: Home / Self Care | Payer: BC Managed Care – PPO | Source: Ambulatory Visit | Attending: Diagnostic Neuroimaging | Admitting: Diagnostic Neuroimaging

## 2022-10-22 DIAGNOSIS — G8929 Other chronic pain: Secondary | ICD-10-CM | POA: Diagnosis not present

## 2022-10-22 DIAGNOSIS — R200 Anesthesia of skin: Secondary | ICD-10-CM

## 2022-10-22 DIAGNOSIS — R531 Weakness: Secondary | ICD-10-CM

## 2022-10-22 MED ORDER — GADOPICLENOL 0.5 MMOL/ML IV SOLN
10.0000 mL | Freq: Once | INTRAVENOUS | Status: AC | PRN
  Administered 2022-10-22: 10 mL via INTRAVENOUS

## 2022-11-05 DIAGNOSIS — M5416 Radiculopathy, lumbar region: Secondary | ICD-10-CM | POA: Diagnosis not present

## 2022-11-05 DIAGNOSIS — M5451 Vertebrogenic low back pain: Secondary | ICD-10-CM | POA: Diagnosis not present

## 2022-11-05 DIAGNOSIS — M25559 Pain in unspecified hip: Secondary | ICD-10-CM | POA: Diagnosis not present

## 2022-11-24 DIAGNOSIS — G5711 Meralgia paresthetica, right lower limb: Secondary | ICD-10-CM | POA: Diagnosis not present

## 2022-11-24 DIAGNOSIS — M79651 Pain in right thigh: Secondary | ICD-10-CM | POA: Diagnosis not present

## 2022-12-29 ENCOUNTER — Telehealth: Payer: Self-pay | Admitting: Internal Medicine

## 2022-12-29 NOTE — Telephone Encounter (Signed)
Received a phone call from patient.  He states he feels like he has an intestinal blockage and that you are aware of his history.  He has an appointment with Dr. Carlean Purl 2/28 at 9:50 but wants to know if there is any way possible to see him sooner as he would like to avoid having to go to the hospital.  Please call and advise.  Thank you.

## 2022-12-30 NOTE — Telephone Encounter (Signed)
Left message for pt to call back  °

## 2022-12-30 NOTE — Telephone Encounter (Signed)
Pt stated that he feels that he has a partial SBO as where is has had these in the past. Pt stated that he has had no solid food since last Friday 12/26/2022, drinking only liquids and soup, has abdominal pain some nausea and some abdominal distention: Pt was stated that he had a little diarrhea yesterday and he does feel that his stomach is rumbling: Pt stated that he does not want to go to the ED whereas he has had to do this multiple times in the past:   Pt requesting an office visit: Pt scheduled to see Dr.Gessner on 12/31/2022 at 1:30 PM. Pt made aware Pt notified that if he worsens then he will have to proceed to the ED for evaluation and treatment:  Pt verbalized understanding with all questions answered.

## 2022-12-31 ENCOUNTER — Encounter: Payer: Self-pay | Admitting: Internal Medicine

## 2022-12-31 ENCOUNTER — Ambulatory Visit (INDEPENDENT_AMBULATORY_CARE_PROVIDER_SITE_OTHER)
Admission: RE | Admit: 2022-12-31 | Discharge: 2022-12-31 | Disposition: A | Discharge status: Home / Self Care | Payer: Medicare Other | Source: Ambulatory Visit | Attending: Internal Medicine | Admitting: Internal Medicine

## 2022-12-31 ENCOUNTER — Ambulatory Visit: Payer: Medicare Other | Admitting: Internal Medicine

## 2022-12-31 ENCOUNTER — Other Ambulatory Visit (INDEPENDENT_AMBULATORY_CARE_PROVIDER_SITE_OTHER): Payer: Medicare Other

## 2022-12-31 VITALS — BP 108/66 | HR 79 | Ht 73.0 in | Wt 208.0 lb

## 2022-12-31 DIAGNOSIS — R1084 Generalized abdominal pain: Secondary | ICD-10-CM | POA: Diagnosis not present

## 2022-12-31 DIAGNOSIS — R197 Diarrhea, unspecified: Secondary | ICD-10-CM | POA: Diagnosis not present

## 2022-12-31 DIAGNOSIS — R140 Abdominal distension (gaseous): Secondary | ICD-10-CM

## 2022-12-31 DIAGNOSIS — R109 Unspecified abdominal pain: Secondary | ICD-10-CM | POA: Diagnosis not present

## 2022-12-31 LAB — COMPREHENSIVE METABOLIC PANEL
ALT: 30 U/L (ref 0–53)
AST: 22 U/L (ref 0–37)
Albumin: 4.7 g/dL (ref 3.5–5.2)
Alkaline Phosphatase: 58 U/L (ref 39–117)
BUN: 14 mg/dL (ref 6–23)
CO2: 28 mEq/L (ref 19–32)
Calcium: 9.8 mg/dL (ref 8.4–10.5)
Chloride: 100 mEq/L (ref 96–112)
Creatinine, Ser: 1.09 mg/dL (ref 0.40–1.50)
GFR: 76.65 mL/min (ref >60.00)
Glucose, Bld: 112 mg/dL — ABNORMAL HIGH (ref 70–99)
Potassium: 4.2 mEq/L (ref 3.5–5.1)
Sodium: 136 mEq/L (ref 135–145)
Total Bilirubin: 1.6 mg/dL — ABNORMAL HIGH (ref 0.2–1.2)
Total Protein: 8 g/dL (ref 6.0–8.3)

## 2022-12-31 LAB — CBC WITH DIFFERENTIAL/PLATELET
Basophils Absolute: 0 10*3/uL (ref 0.0–0.1)
Basophils Relative: 0.5 % (ref 0.0–3.0)
Eosinophils Absolute: 0.1 10*3/uL (ref 0.0–0.7)
Eosinophils Relative: 1.7 % (ref 0.0–5.0)
HCT: 46.4 % (ref 39.0–52.0)
Hemoglobin: 15.7 g/dL (ref 13.0–17.0)
Lymphocytes Relative: 50.3 % — ABNORMAL HIGH (ref 12.0–46.0)
Lymphs Abs: 2.6 10*3/uL (ref 0.7–4.0)
MCHC: 33.9 g/dL (ref 30.0–36.0)
MCV: 83.8 fl (ref 78.0–100.0)
Monocytes Absolute: 0.5 10*3/uL (ref 0.1–1.0)
Monocytes Relative: 9.4 % (ref 3.0–12.0)
Neutro Abs: 2 10*3/uL (ref 1.4–7.7)
Neutrophils Relative %: 38.1 % — ABNORMAL LOW (ref 43.0–77.0)
Platelets: 215 10*3/uL (ref 150.0–400.0)
RBC: 5.53 Mil/uL (ref 4.22–5.81)
RDW: 13.5 % (ref 11.5–15.5)
WBC: 5.2 10*3/uL (ref 4.0–10.5)

## 2022-12-31 LAB — LIPASE: Lipase: 32 U/L (ref 11.0–59.0)

## 2022-12-31 LAB — AMYLASE: Amylase: 54 U/L (ref 27–131)

## 2022-12-31 MED ORDER — ONDANSETRON HCL 4 MG PO TABS: 4.0000 mg | ORAL_TABLET | Freq: Three times a day (TID) | ORAL | 1 refills | Status: DC | PRN

## 2022-12-31 MED ORDER — METOCLOPRAMIDE HCL 10 MG PO TABS: 10.0000 mg | ORAL_TABLET | Freq: Four times a day (QID) | ORAL | 0 refills | Status: DC | PRN

## 2022-12-31 MED ORDER — TRAMADOL HCL 50 MG PO TABS: 50.0000 mg | ORAL_TABLET | Freq: Four times a day (QID) | ORAL | 0 refills | Status: DC | PRN

## 2022-12-31 NOTE — Patient Instructions (Addendum)
Your provider has requested that you go to the basement level for lab work before leaving today. Press "B" on the elevator. The lab is located at the first door on the left as you exit the elevator.  We have sent medications to your pharmacy for you to pick up at your convenience.  Please go to our x-ray department in the basement before leaving today.   ORAL REHYDRATION SOLUTION RECIPES use one or more of these to stay hydrated   Sugar and salt water   ? 1 quart water ?  teaspoon salt ? 6 teaspoons sugar ? Optional: Crystal Light to taste (especially lemonade or orange-pineapple flavors)   Gatorade G2   ? 4 cups Gatorade G2 (or one, 32 ounce bottle) ? 1/2 teaspoon salt   Chicken Broth   ? 4 cups water ? 1 dry chicken broth cube ?  teaspoon salt ? 2 tablespoon sugar OR ? 2 cups liquid broth ? 2 cups water ? 2 tablespoon sugar  Tomato Juice   ? 2  cups tomato juice ? 1  cups water  Homemade Cereal Based  ?  cup dry, precooked baby rice cereal ? 2 cups water ?  teaspoon salt ? Combine ingredients and mix until well dissolved and smooth. Refrigerate. Solution should be thick, but pourable and drinkable.   I appreciate the opportunity to care for you. Silvano Rusk, MD, [facility]

## 2022-12-31 NOTE — Progress Notes (Signed)
[name] 57 y.o. Oct 08, 1966 [id number]  Assessment & Plan:   Encounter Diagnoses  Name Primary?   Abdominal distention Yes   Generalized abdominal pain    Diarrhea, unspecified type     ? Recurrent SBO vs functional GI flare, ? Liraglutid side effect Force fluids and advance diet as tolerated.  Orders Placed This Encounter  Procedures   DG Abd 2 Views   CBC with Differential/Platelet   Comprehensive metabolic panel   Lipase   Amylase     Meds ordered this encounter  Medications   ondansetron (ZOFRAN) 4 MG tablet    Sig: Take 1 tablet (4 mg total) by mouth every 8 (eight) hours as needed for nausea or vomiting.    Dispense:  30 tablet    Refill:  1   traMADol (ULTRAM) 50 MG tablet    Sig: Take 1 tablet (50 mg total) by mouth every 6 (six) hours as needed.    Dispense:  30 tablet    Refill:  0   metoCLOPramide (REGLAN) 10 MG tablet    Sig: Take 1 tablet (10 mg total) by mouth every 6 (six) hours as needed for nausea (and reflux/regurgitation).    Dispense:  30 tablet    Refill:  0     Subjective:   Chief Complaint: Abdominal distention question SBO  HPI 57 year old [name] man with a history of IBS and recurrent small bowel obstructions who presents with abdominal distention nausea and some diarrhea.  He was hospitalized with an SBO in June and managed conservatively and several days ago started having distention generalized abdominal pain and lack of bowel movements.  Some nausea no vomiting.  He has been staying on a little bit of liquids.  No fever no bleeding.  He is up-to-date with colonoscopy.  I have reviewed the records of that June hospitalization as well.  He is starting to feel little better and starting to have some loose stools and feels like things are a bit better.  He does get some regurgitation or reflux flares during these episodes as well and is having that as well.  Since I last saw him in 2020 and he was seen here 2021 he has  gone on disability due to C-spine and L-spine issues.  He is in a cervical collar today and he wears a back brace. Allergies  Allergen Reactions   Metformin And Related     diarrhea   Current Meds  Medication Sig   Cholecalciferol (VITAMIN D) 50 MCG (2000 UT) CAPS Take 1 capsule by mouth daily.   gabapentin (NEURONTIN) 300 MG capsule Take 1 capsule (300 mg total) by mouth 3 (three) times daily as needed.   JANUVIA 100 MG tablet Take 1 tablet (100 mg total) by mouth daily.   Lactobacillus-Inulin (CULTURELLE DIGESTIVE DAILY PO) Take 1 capsule by mouth daily.   loratadine (CLARITIN) 10 MG tablet Take 10 mg by mouth daily.   metoCLOPramide (REGLAN) 10 MG tablet Take 1 tablet (10 mg total) by mouth every 6 (six) hours as needed for nausea (and reflux/regurgitation).   Multiple Vitamin (VITAMIN E/FOLIC KVQQ/V-9/D-63 PO) Take 1 tablet by mouth daily.   ondansetron (ZOFRAN) 4 MG tablet Take 1 tablet (4 mg total) by mouth every 8 (eight) hours as needed for nausea or vomiting.   pantoprazole (PROTONIX) 40 MG tablet TAKE 1 TABLET BY MOUTH EVERY DAY   polyethylene glycol powder (GLYCOLAX/MIRALAX) 17 GM/SCOOP powder Take 17 g by mouth daily.   rosuvastatin (CRESTOR)  5 MG tablet Take 1 tablet (5 mg total) by mouth daily.   traMADol (ULTRAM) 50 MG tablet Take 1 tablet (50 mg total) by mouth every 6 (six) hours as needed.   triamcinolone cream (KENALOG) 0.1 % APPLY 1 APPLICATION TOPICALLY 2 (TWO) TIMES DAILY AS NEEDED (RASH ON LEG).   vitamin C (ASCORBIC ACID) 500 MG tablet Take 500 mg by mouth daily.   Past Medical History:  Diagnosis Date   Allergy    Chest wall pain    Cough    Diabetes (HCC)    Diarrhea recurrent   GERD (gastroesophageal reflux disease)    Headache(784.0)    Irritable bowel syndrome    Obstructive sleep apnea    apnealink 10/23/10 AHI 5, Sp02 low 87%   Postural lightheadedness    SBO (small bowel obstruction) ([place]) 12/2015   Sleep apnea    11/27/21 will get new test    Small bowel obstruction [facility])    Past Surgical History:  Procedure Laterality Date   CHOLECYSTECTOMY  2009   COLECTOMY  1994   segmental   COLONOSCOPY  07/17/2011   hemorrhoids, otherwise normal including terminal ileum and random colon biopises   GASTROSTOMY  2005   stamm   LAPAROTOMY  2002, 2005, 2009   SPHINCTEROTOMY  2009   lateral internal   UPPER GASTROINTESTINAL ENDOSCOPY  07/17/2011   normal, including duodenal biopsies   Social History   Social History Narrative   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   Alcohol Use-no   Illicit Drug Use-no   Married-Divorced-Married again 2009-    4 children 2 sons and 2 daughters   [place] heritage   No regular exercise   family history includes Hypertension in his mother; Liver disease in his father.   Review of Systems As per HPI  Objective:   Physical Exam BP 108/66   Pulse 79   Ht 6\' 1"  (1.854 m)   Wt 208 lb (94.3 kg)   BMI 27.44 kg/m  Lungs cta Cor S1S2 no rmg Abd mildly distended BS decreased, nontender Alert and oriented x 3

## 2023-01-01 NOTE — Telephone Encounter (Signed)
Inbound call from patient regarding labs. Please advise.  Thank you

## 2023-01-02 ENCOUNTER — Other Ambulatory Visit: Payer: Self-pay | Admitting: Internal Medicine

## 2023-01-02 DIAGNOSIS — R197 Diarrhea, unspecified: Secondary | ICD-10-CM

## 2023-01-02 NOTE — Telephone Encounter (Signed)
Patient is returning call. Please advise? 

## 2023-01-02 NOTE — Telephone Encounter (Signed)
Spoke with Pt.  Documented in result notes 

## 2023-01-02 NOTE — Telephone Encounter (Signed)
Left message for pt to call back  °

## 2023-01-12 ENCOUNTER — Other Ambulatory Visit: Payer: Medicare Other

## 2023-01-12 ENCOUNTER — Telehealth: Payer: Self-pay | Admitting: Internal Medicine

## 2023-01-12 DIAGNOSIS — R197 Diarrhea, unspecified: Secondary | ICD-10-CM | POA: Diagnosis not present

## 2023-01-12 NOTE — Telephone Encounter (Signed)
Inbound call from patient , states he is feeling ready bad he is  having a stomach pain, diarrhea that he can't control and he is feeling dehydrated he is not able to eat anything . Want a sooner  appointment  then  3/18.Marland KitchenPlease advise

## 2023-01-13 LAB — CLOSTRIDIUM DIFFICILE TOXIN B, QUALITATIVE, REAL-TIME PCR: Toxigenic C. Difficile by PCR: NOT DETECTED

## 2023-01-13 MED ORDER — DICYCLOMINE HCL 20 MG PO TABS: 20.0000 mg | ORAL_TABLET | Freq: Three times a day (TID) | ORAL | 0 refills | Status: DC

## 2023-01-13 NOTE — Telephone Encounter (Signed)
Pt made aware of Dr. Carlean Purl recommendations.  Pt made aware of the prescription sent to pharmacy: Pt verbalized understanding with all questions answered.

## 2023-01-13 NOTE — Telephone Encounter (Signed)
Pt stated that he is still having many loose BM a day, only drinking Gatorade, not taking in anything solid, Burping a lot, lots of gas, very week, abdomen not as distended but some.  Pt states that as soon as he drinks something it goes right through him and he has diarrhea every time.  Pt had recent office visit, labs, xray. Please advise

## 2023-01-13 NOTE — Telephone Encounter (Signed)
Left message for pt to call back  °

## 2023-01-13 NOTE — Telephone Encounter (Signed)
Am Rxing dicyclomine to take qac  C diff is negative  Waiting on stool culture and once we see that will determine follow-up which can be sooner  Meds ordered this encounter  Medications   dicyclomine (BENTYL) 20 MG tablet    Sig: Take 1 tablet (20 mg total) by mouth 3 (three) times daily before meals.    Dispense:  90 tablet    Refill:  0

## 2023-01-14 ENCOUNTER — Telehealth: Payer: Self-pay | Admitting: Internal Medicine

## 2023-01-14 NOTE — Telephone Encounter (Signed)
PT is calling to get results of c diff. Please advise

## 2023-01-15 NOTE — Telephone Encounter (Signed)
Stool cultures are negative though there is 1 part that is still pending and C. difficile was negative.  He should continue dicyclomine and Imodium.  There is a visit open to see me next week on February 14.  We should give him that appointment.

## 2023-01-15 NOTE — Telephone Encounter (Signed)
Pt states that he is still having lots of diarrhea every time he drinks water or Gatorade stating that it runs right through him.  Pt questioning lab results. Pt notified that he could take Imodium for the diarrhea and to call us back tomorrow with a symptom update. Please advise on any other recommendations.

## 2023-01-16 NOTE — Telephone Encounter (Signed)
Left message for pt to call back. Pt scheduled for an office visit on 01/21/2023 at 8:50 to see Dr. Carlean Purl

## 2023-01-16 NOTE — Telephone Encounter (Signed)
Pt made aware of recent results and Dr. Carlean Purl recommendations:  Pt made aware of the office visit on 01/21/2023 at 8:50 to see Dr. Carlean Purl . Pt stated that he took the imodium this AM and slept most of the day, pt stated that he had 1 diarrhea today only but had not really drink to many liquids. Pt notified to stay hydrated.  Pt verbalized understanding with all questions answered.

## 2023-01-17 LAB — STOOL CULTURE: E coli, Shiga toxin Assay: NEGATIVE

## 2023-01-21 ENCOUNTER — Ambulatory Visit: Payer: Medicare Other | Admitting: Internal Medicine

## 2023-01-21 ENCOUNTER — Encounter: Payer: Self-pay | Admitting: Internal Medicine

## 2023-01-21 VITALS — BP 102/64 | HR 67 | Ht 73.0 in | Wt 207.0 lb

## 2023-01-21 DIAGNOSIS — K582 Mixed irritable bowel syndrome: Secondary | ICD-10-CM

## 2023-01-21 DIAGNOSIS — K5900 Constipation, unspecified: Secondary | ICD-10-CM

## 2023-01-21 DIAGNOSIS — R140 Abdominal distension (gaseous): Secondary | ICD-10-CM

## 2023-01-21 MED ORDER — METRONIDAZOLE 500 MG PO TABS: 500.0000 mg | ORAL_TABLET | Freq: Two times a day (BID) | ORAL | 0 refills | Status: DC

## 2023-01-21 NOTE — Patient Instructions (Addendum)
We have sent the following medications to your pharmacy for you to pick up at your convenience: Flagyl  If you don't have a bowel movement in 1 to 2 days take an over the counter Dulcolax.   Due to recent changes in healthcare laws, you may see the results of your imaging and laboratory studies on MyChart before your provider has had a chance to review them.  We understand that in some cases there may be results that are confusing or concerning to you. Not all laboratory results come back in the same time frame and the provider may be waiting for multiple results in order to interpret others.  Please give [place] 48 hours in order for your provider to thoroughly review all the results before contacting the office for clarification of your results.    I appreciate the opportunity to care for you. Silvano Rusk, MD, [facility]

## 2023-01-21 NOTE — Progress Notes (Signed)
[name] 57 y.o. 1966/11/17 [id number]  Assessment & Plan:   Encounter Diagnoses  Name Primary?   Constipation, unspecified constipation type Yes   Abdominal distention    Bloating    Irritable bowel syndrome with both constipation and diarrhea    The foul-smelling belching and overall history makes me wonder about a component of small intestinal bacterial overgrowth.  I am going to try some empiric Flagyl and have him treat his constipation as below and follow him up.  If he fails to get relief we will consider additional imaging versus colonoscopy.  He had a negative colonoscopy in 2019.  He is not using the dicyclomine right now and he should avoid that while constipated. Meds ordered this encounter  Medications   metroNIDAZOLE (FLAGYL) 500 MG tablet    Sig: Take 1 tablet (500 mg total) by mouth 2 (two) times daily.    Dispense:  14 tablet    Refill:  0    Miralax + prn dulcolax to treat constipation for now.  02/17/2023 follow-up is planned     Subjective:   Chief Complaint: Diarrhea turning into constipation  HPI 57 year old Asian man with a history of IBS and also history of recurrent small bowel obstructions who was seen 12/31/2022 with abdominal stanchion and diarrhea.  I thought he might be recovering from an infectious issue.  KUB and labs were unrevealing.  I had him do stool studies after he called back with persistent diarrhea and stool culture and C. difficile were negative.  I prescribed dicyclomine 20 mg 3 times daily and he took that for a few days and then he has been constipated for about 5 or 6 days.  Some flatus.  Still feels very bloated and a lot of upper abdominal pressure.  No bleeding.  No severe abdominal pain but an achiness.  He says he has burps that smell and taste like hard-boiled eggs. Allergies  Allergen Reactions   Metformin And Related     diarrhea   Current Meds  Medication Sig   Cholecalciferol (VITAMIN D) 50 MCG (2000 UT) CAPS  Take 1 capsule by mouth daily.   gabapentin (NEURONTIN) 300 MG capsule Take 1 capsule (300 mg total) by mouth 3 (three) times daily as needed.   JANUVIA 100 MG tablet Take 1 tablet (100 mg total) by mouth daily.   Lactobacillus-Inulin (CULTURELLE DIGESTIVE DAILY PO) Take 1 capsule by mouth daily.   loratadine (CLARITIN) 10 MG tablet Take 10 mg by mouth daily.   metoCLOPramide (REGLAN) 10 MG tablet Take 1 tablet (10 mg total) by mouth every 6 (six) hours as needed for nausea (and reflux/regurgitation).   Multiple Vitamin (VITAMIN E/FOLIC A999333 PO) Take 1 tablet by mouth daily.   ondansetron (ZOFRAN) 4 MG tablet Take 1 tablet (4 mg total) by mouth every 8 (eight) hours as needed for nausea or vomiting.   pantoprazole (PROTONIX) 40 MG tablet TAKE 1 TABLET BY MOUTH EVERY DAY   polyethylene glycol powder (GLYCOLAX/MIRALAX) 17 GM/SCOOP powder Take 17 g by mouth daily.   rosuvastatin (CRESTOR) 5 MG tablet Take 1 tablet (5 mg total) by mouth daily.   traMADol (ULTRAM) 50 MG tablet Take 1 tablet (50 mg total) by mouth every 6 (six) hours as needed.   triamcinolone cream (KENALOG) 0.1 % APPLY 1 APPLICATION TOPICALLY 2 (TWO) TIMES DAILY AS NEEDED (RASH ON LEG).   vitamin C (ASCORBIC ACID) 500 MG tablet Take 500 mg by mouth daily.   Past Medical History:  Diagnosis Date   Allergy    Chest wall pain    Cough    Diabetes (HCC)    Diarrhea recurrent   GERD (gastroesophageal reflux disease)    Headache(784.0)    Irritable bowel syndrome    Obstructive sleep apnea    apnealink 10/23/10 AHI 5, Sp02 low 87%   Postural lightheadedness    SBO (small bowel obstruction) ([place]) 12/2015   Sleep apnea    11/27/21 will get new test   Small bowel obstruction [facility])    Past Surgical History:  Procedure Laterality Date   CHOLECYSTECTOMY  2009   COLECTOMY  1994   segmental   COLONOSCOPY  07/17/2011   hemorrhoids, otherwise normal including terminal ileum and random colon biopises   GASTROSTOMY  2005    stamm   LAPAROTOMY  2002, 2005, 2009   SPHINCTEROTOMY  2009   lateral internal   UPPER GASTROINTESTINAL ENDOSCOPY  07/17/2011   normal, including duodenal biopsies   Social History   Social History Narrative   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   Alcohol Use-no   Illicit Drug Use-no   Married-Divorced-Married again 2009-    4 children 2 sons and 2 daughters   [place] heritage   No regular exercise   family history includes Hypertension in his mother; Liver disease in his father.   Review of Systems  As per HPI Objective:   Physical Exam BP 102/64   Pulse 67   Ht 6' 1"$  (1.854 m)   Wt 207 lb (93.9 kg)   BMI 27.31 kg/m  NAD Lungs cta Cor NL Abd mildly distended, soft and sl tender LUQ Rectal exam normal anoderm scanty very soft brown stool no mass

## 2023-01-26 ENCOUNTER — Emergency Department (HOSPITAL_COMMUNITY)
Admission: EM | Admit: 2023-01-26 | Discharge: 2023-01-27 | Disposition: A | Discharge status: Home / Self Care | Payer: Medicare Other | Attending: Emergency Medicine | Admitting: Emergency Medicine

## 2023-01-26 ENCOUNTER — Emergency Department (HOSPITAL_COMMUNITY): Payer: Medicare Other

## 2023-01-26 ENCOUNTER — Encounter (HOSPITAL_COMMUNITY): Payer: Self-pay

## 2023-01-26 DIAGNOSIS — M5432 Sciatica, left side: Secondary | ICD-10-CM

## 2023-01-26 DIAGNOSIS — R197 Diarrhea, unspecified: Secondary | ICD-10-CM | POA: Insufficient documentation

## 2023-01-26 DIAGNOSIS — R0789 Other chest pain: Secondary | ICD-10-CM | POA: Diagnosis not present

## 2023-01-26 DIAGNOSIS — R55 Syncope and collapse: Secondary | ICD-10-CM | POA: Insufficient documentation

## 2023-01-26 DIAGNOSIS — R079 Chest pain, unspecified: Secondary | ICD-10-CM | POA: Insufficient documentation

## 2023-01-26 DIAGNOSIS — M5442 Lumbago with sciatica, left side: Secondary | ICD-10-CM | POA: Insufficient documentation

## 2023-01-26 DIAGNOSIS — R110 Nausea: Secondary | ICD-10-CM | POA: Diagnosis not present

## 2023-01-26 DIAGNOSIS — Q630 Accessory kidney: Secondary | ICD-10-CM | POA: Diagnosis not present

## 2023-01-26 DIAGNOSIS — K760 Fatty (change of) liver, not elsewhere classified: Secondary | ICD-10-CM | POA: Diagnosis not present

## 2023-01-26 DIAGNOSIS — R1084 Generalized abdominal pain: Secondary | ICD-10-CM

## 2023-01-26 DIAGNOSIS — M549 Dorsalgia, unspecified: Secondary | ICD-10-CM | POA: Diagnosis not present

## 2023-01-26 DIAGNOSIS — J9811 Atelectasis: Secondary | ICD-10-CM | POA: Diagnosis not present

## 2023-01-26 LAB — COMPREHENSIVE METABOLIC PANEL
ALT: 18 U/L (ref 0–44)
AST: 22 U/L (ref 15–41)
Albumin: 4.1 g/dL (ref 3.5–5.0)
Alkaline Phosphatase: 58 U/L (ref 38–126)
Anion gap: 11 (ref 5–15)
BUN: 12 mg/dL (ref 6–20)
CO2: 24 mmol/L (ref 22–32)
Calcium: 9.1 mg/dL (ref 8.9–10.3)
Chloride: 98 mmol/L (ref 98–111)
Creatinine, Ser: 1.11 mg/dL (ref 0.61–1.24)
GFR, Estimated: >60 mL/min (ref >60)
Glucose, Bld: 117 mg/dL — ABNORMAL HIGH (ref 70–99)
Potassium: 3.4 mmol/L — ABNORMAL LOW (ref 3.5–5.1)
Sodium: 133 mmol/L — ABNORMAL LOW (ref 135–145)
Total Bilirubin: 1 mg/dL (ref 0.3–1.2)
Total Protein: 7.4 g/dL (ref 6.5–8.1)

## 2023-01-26 LAB — URINALYSIS, ROUTINE W REFLEX MICROSCOPIC
Bilirubin Urine: NEGATIVE
Glucose, UA: NEGATIVE mg/dL
Ketones, ur: NEGATIVE mg/dL
Leukocytes,Ua: NEGATIVE
Nitrite: NEGATIVE
Protein, ur: NEGATIVE mg/dL
Specific Gravity, Urine: <1.005 — ABNORMAL LOW (ref 1.005–1.030)
pH: 6 (ref 5.0–8.0)

## 2023-01-26 LAB — I-STAT CHEM 8, ED
BUN: 14 mg/dL (ref 6–20)
Calcium, Ion: 1.12 mmol/L — ABNORMAL LOW (ref 1.15–1.40)
Chloride: 99 mmol/L (ref 98–111)
Creatinine, Ser: 1 mg/dL (ref 0.61–1.24)
Glucose, Bld: 110 mg/dL — ABNORMAL HIGH (ref 70–99)
HCT: 48 % (ref 39.0–52.0)
Hemoglobin: 16.3 g/dL (ref 13.0–17.0)
Potassium: 3.6 mmol/L (ref 3.5–5.1)
Sodium: 136 mmol/L (ref 135–145)
TCO2: 23 mmol/L (ref 22–32)

## 2023-01-26 LAB — CBC
HCT: 46.8 % (ref 39.0–52.0)
Hemoglobin: 16.1 g/dL (ref 13.0–17.0)
MCH: 28.7 pg (ref 26.0–34.0)
MCHC: 34.4 g/dL (ref 30.0–36.0)
MCV: 83.4 fL (ref 80.0–100.0)
Platelets: 233 10*3/uL (ref 150–400)
RBC: 5.61 MIL/uL (ref 4.22–5.81)
RDW: 12.8 % (ref 11.5–15.5)
WBC: 4.9 10*3/uL (ref 4.0–10.5)
nRBC: 0 % (ref 0.0–0.2)

## 2023-01-26 LAB — URINALYSIS, MICROSCOPIC (REFLEX)
Bacteria, UA: NONE SEEN
Squamous Epithelial / HPF: NONE SEEN /HPF (ref 0–5)

## 2023-01-26 LAB — TROPONIN I (HIGH SENSITIVITY)
Troponin I (High Sensitivity): 4 ng/L (ref <18)
Troponin I (High Sensitivity): 3 ng/L (ref <18)

## 2023-01-26 LAB — LIPASE, BLOOD: Lipase: 40 U/L (ref 11–51)

## 2023-01-26 MED ORDER — LIDOCAINE 4 % EX PTCH: 1.0000 | MEDICATED_PATCH | CUTANEOUS | 0 refills | Status: AC

## 2023-01-26 MED ORDER — SODIUM CHLORIDE 0.9 % IV BOLUS
1000.0000 mL | Freq: Once | INTRAVENOUS | Status: AC
  Administered 2023-01-26: 1000 mL via INTRAVENOUS

## 2023-01-26 MED ORDER — KETOROLAC TROMETHAMINE 10 MG PO TABS: 10.0000 mg | ORAL_TABLET | Freq: Four times a day (QID) | ORAL | 0 refills | Status: DC | PRN

## 2023-01-26 MED ORDER — MORPHINE SULFATE (PF) 4 MG/ML IV SOLN
4.0000 mg | Freq: Once | INTRAVENOUS | Status: AC
  Administered 2023-01-26: 4 mg via INTRAVENOUS
  Filled 2023-01-26: qty 1

## 2023-01-26 MED ORDER — IOHEXOL 350 MG/ML SOLN
100.0000 mL | Freq: Once | INTRAVENOUS | Status: AC | PRN
  Administered 2023-01-26: 100 mL via INTRAVENOUS

## 2023-01-26 MED ORDER — OXYCODONE HCL 5 MG PO TABS
5.0000 mg | ORAL_TABLET | Freq: Once | ORAL | Status: AC
  Administered 2023-01-26: 5 mg via ORAL
  Filled 2023-01-26: qty 1

## 2023-01-26 MED ORDER — KETOROLAC TROMETHAMINE 15 MG/ML IJ SOLN
15.0000 mg | Freq: Once | INTRAMUSCULAR | Status: AC
  Administered 2023-01-26: 15 mg via INTRAVENOUS
  Filled 2023-01-26: qty 1

## 2023-01-26 MED ORDER — CYCLOBENZAPRINE HCL 5 MG PO TABS: 10.0000 mg | ORAL_TABLET | Freq: Two times a day (BID) | ORAL | 0 refills | Status: DC | PRN

## 2023-01-26 MED ORDER — ONDANSETRON 4 MG PO TBDP
4.0000 mg | ORAL_TABLET | Freq: Once | ORAL | Status: AC
  Administered 2023-01-26: 4 mg via ORAL
  Filled 2023-01-26: qty 1

## 2023-01-26 MED ORDER — CYCLOBENZAPRINE HCL 10 MG PO TABS
5.0000 mg | ORAL_TABLET | Freq: Once | ORAL | Status: AC
  Administered 2023-01-26: 5 mg via ORAL
  Filled 2023-01-26: qty 1

## 2023-01-26 MED ORDER — LIDOCAINE 5 % EX PTCH
1.0000 | MEDICATED_PATCH | CUTANEOUS | Status: DC
  Administered 2023-01-26: 1 via TRANSDERMAL
  Filled 2023-01-26: qty 1

## 2023-01-26 MED ORDER — ONDANSETRON HCL 4 MG/2ML IJ SOLN
4.0000 mg | Freq: Once | INTRAMUSCULAR | Status: DC
  Filled 2023-01-26: qty 2

## 2023-01-26 NOTE — ED Notes (Signed)
Back to room from CT  

## 2023-01-26 NOTE — ED Triage Notes (Addendum)
Pt states he is concerned for stomach blockage. Pt c/o left flank pain as well x 3 days. Pt c/o nausea and diarrhea. Pt had syncopal episode on Friday. Pt passed out in the bathroom for 10 minutes. Did not hit head.

## 2023-01-26 NOTE — ED Provider Notes (Signed)
[facility] Provider Note   CSN: [id number] Arrival date & time: 01/26/23  1600     History  Chief Complaint  Patient presents with   Abdominal Pain   Loss of Consciousness    [name] is a 57 y.o. male, history of [name] bowel obstruction, who presents to the ED secondary to severe abdominal pain and left flank pain that has been going on for the last 3 days, with associated nausea, and diarrhea.  Notes that he has severe pain especially in the left side of his back, but abdominal pain as well.  Denies any kind of vomiting, no blood in his urine, denies any urinary frequency.  States that he has a history of bowel obstructions, kidney stones, and diarrhea, and sees Dr. Carlean Purl for GI.   Also notes that he has been having intermittent chest pain that is under his left breast, and sharp, that has been going on for the last couple days, states he has not taken anything for this pain.  Denies shortness of breath.  But does state that he had a syncopal episode in the bathroom about 2 days ago, which she passed out for about 5 minutes.  Did not hit his head, no blood thinner use.  States that he just felt very weak, and then passed out, and his wife caught him in her arms.    Home Medications Prior to Admission medications   Medication Sig Start Date End Date Taking? Authorizing Provider  cyclobenzaprine (FLEXERIL) 5 MG tablet Take 2 tablets (10 mg total) by mouth 2 (two) times daily as needed for muscle spasms. 01/26/23  Yes Kayven Aldaco L, PA  ketorolac (TORADOL) 10 MG tablet Take 1 tablet (10 mg total) by mouth every 6 (six) hours as needed. 01/26/23  Yes Caliya Narine L, PA  lidocaine (HM LIDOCAINE PATCH) 4 % Place 1 patch onto the skin daily. 01/26/23  Yes Amatullah Christy L, PA  Cholecalciferol (VITAMIN D) 50 MCG (2000 UT) CAPS Take 1 capsule by mouth daily.    [provider]  gabapentin (NEURONTIN) 300 MG capsule Take 1 capsule  (300 mg total) by mouth 3 (three) times daily as needed. 06/11/22   Marcial Pacas, MD  JANUVIA 100 MG tablet Take 1 tablet (100 mg total) by mouth daily. 01/21/22   Binnie Rail, MD  Lactobacillus-Inulin (CULTURELLE DIGESTIVE DAILY PO) Take 1 capsule by mouth daily.    [provider]  loratadine (CLARITIN) 10 MG tablet Take 10 mg by mouth daily.    [provider]  metoCLOPramide (REGLAN) 10 MG tablet Take 1 tablet (10 mg total) by mouth every 6 (six) hours as needed for nausea (and reflux/regurgitation). 12/31/22   Gatha Mayer, MD  metroNIDAZOLE (FLAGYL) 500 MG tablet Take 1 tablet (500 mg total) by mouth 2 (two) times daily. 01/21/23   Gatha Mayer, MD  Multiple Vitamin (VITAMIN E/FOLIC A999333 PO) Take 1 tablet by mouth daily.    [provider]  ondansetron (ZOFRAN) 4 MG tablet Take 1 tablet (4 mg total) by mouth every 8 (eight) hours as needed for nausea or vomiting. 12/31/22   Gatha Mayer, MD  pantoprazole (PROTONIX) 40 MG tablet TAKE 1 TABLET BY MOUTH EVERY DAY 10/07/22   Binnie Rail, MD  polyethylene glycol powder (GLYCOLAX/MIRALAX) 17 GM/SCOOP powder Take 17 g by mouth daily. 06/04/22   Lavina Hamman, MD  rosuvastatin (CRESTOR) 5 MG tablet Take 1 tablet (  5 mg total) by mouth daily. 09/30/22   Tobb, Kardie, DO  traMADol (ULTRAM) 50 MG tablet Take 1 tablet (50 mg total) by mouth every 6 (six) hours as needed. 12/31/22   Gatha Mayer, MD  triamcinolone cream (KENALOG) 0.1 % APPLY 1 APPLICATION TOPICALLY 2 (TWO) TIMES DAILY AS NEEDED (RASH ON LEG). 10/07/22   Binnie Rail, MD  vitamin C (ASCORBIC ACID) 500 MG tablet Take 500 mg by mouth daily.    [provider]      Allergies    Metformin and related    Review of Systems   Review of Systems  Cardiovascular:  Positive for syncope.  Gastrointestinal:  Positive for abdominal pain.    Physical Exam Updated Vital Signs BP 111/88   Pulse 71   Temp 98.7 F (37.1 C) (Oral)   Resp 15    Ht 6' 1"$  (1.854 m)   Wt 93.9 kg   SpO2 96%   BMI 27.31 kg/m  Physical Exam Vitals and nursing note reviewed.  Constitutional:      General: He is not in acute distress.    Appearance: He is well-developed.  HENT:     Head: Normocephalic and atraumatic.  Eyes:     Conjunctiva/sclera: Conjunctivae normal.  Cardiovascular:     Rate and Rhythm: Normal rate and regular rhythm.     Heart sounds: No murmur heard. Pulmonary:     Effort: Pulmonary effort is normal. No respiratory distress.     Breath sounds: Normal breath sounds.  Abdominal:     General: Bowel sounds are normal.     Palpations: Abdomen is soft.     Tenderness: There is generalized abdominal tenderness. There is left CVA tenderness and guarding. There is no rebound.  Musculoskeletal:        General: No swelling.     Cervical back: Neck supple.  Skin:    General: Skin is warm and dry.     Capillary Refill: Capillary refill takes less than 2 seconds.  Neurological:     Mental Status: He is alert.  Psychiatric:        Mood and Affect: Mood normal.     ED Results / Procedures / Treatments   Labs (all labs ordered are listed, but only abnormal results are displayed) Labs Reviewed  COMPREHENSIVE METABOLIC PANEL - Abnormal; Notable for the following components:      Result Value   Sodium 133 (*)    Potassium 3.4 (*)    Glucose, Bld 117 (*)    All other components within normal limits  URINALYSIS, ROUTINE W REFLEX MICROSCOPIC - Abnormal; Notable for the following components:   Specific Gravity, Urine <1.005 (*)    Hgb urine dipstick TRACE (*)    All other components within normal limits  I-STAT CHEM 8, ED - Abnormal; Notable for the following components:   Glucose, Bld 110 (*)    Calcium, Ion 1.12 (*)    All other components within normal limits  LIPASE, BLOOD  CBC  URINALYSIS, MICROSCOPIC (REFLEX)  TROPONIN I (HIGH SENSITIVITY)  TROPONIN I (HIGH SENSITIVITY)    EKG EKG  Interpretation  Date/Time:  Monday January 26 2023 18:45:11 EST Ventricular Rate:  81 PR Interval:  153 QRS Duration: 129 QT Interval:  389 QTC Calculation: 452 R Axis:   47 Text Interpretation: Sinus rhythm Right bundle branch block ST elevation suggests acute pericarditis No significant change since last tracing Confirmed by Blanchie Dessert [phone]) on 01/26/2023 7:00:24 PM  Radiology CT Angio Chest/Abd/Pel for Dissection W and/or Wo Contrast  Result Date: 01/26/2023 CLINICAL DATA:  Severe chest and back pain. EXAM: CT ANGIOGRAPHY CHEST, ABDOMEN AND PELVIS TECHNIQUE: Non-contrast CT of the chest was initially obtained. Multidetector CT imaging through the chest, abdomen and pelvis was performed using the standard protocol during bolus administration of intravenous contrast. Multiplanar reconstructed images and MIPs were obtained and reviewed to evaluate the vascular anatomy. RADIATION DOSE REDUCTION: This exam was performed according to the departmental dose-optimization program which includes automated exposure control, adjustment of the mA and/or kV according to patient size and/or use of iterative reconstruction technique. CONTRAST:  14m OMNIPAQUE IOHEXOL 350 MG/ML SOLN COMPARISON:  CT abdomen pelvis dated 05/31/2022 and chest radiograph dated 01/26/2023. FINDINGS: CTA CHEST FINDINGS Cardiovascular: There is no cardiomegaly or pericardial effusion. The thoracic aorta is unremarkable. The origins of the great vessels of the arch appear patent. No pulmonary artery embolus identified. Mediastinum/Nodes: No hilar or mediastinal adenopathy. The esophagus and the thyroid gland are grossly unremarkable. No mediastinal fluid collection. Lungs/Pleura: Minimal bibasilar dependent subpleural atelectasis. No focal consolidation, pleural effusion, pneumothorax. The central airways are patent. Musculoskeletal: No acute osseous pathology. Review of the MIP images confirms the above findings. CTA ABDOMEN AND  PELVIS FINDINGS VASCULAR Aorta: Normal caliber aorta without aneurysm, dissection, vasculitis or significant stenosis. Celiac: Patent without evidence of aneurysm, dissection, vasculitis or significant stenosis. SMA: Patent without evidence of aneurysm, dissection, vasculitis or significant stenosis. Renals: Both renal arteries are patent without evidence of aneurysm, dissection, vasculitis, fibromuscular dysplasia or significant stenosis. 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 IMA: Patent without evidence of aneurysm, dissection, vasculitis or significant stenosis. Inflow: Patent without evidence of aneurysm, dissection, vasculitis or significant stenosis. Veins: No obvious venous abnormality within the limitations of this arterial phase study. Review of the MIP images confirms the above findings. NON-VASCULAR No intra-abdominal free air or free fluid. Hepatobiliary: Fatty liver. No dilatation cholecystectomy. No retained calcified stone noted in the central CBD. Pancreas: Unremarkable. No pancreatic ductal dilatation or surrounding inflammatory changes. Spleen: Normal in size without focal abnormality. Adrenals/Urinary Tract: The adrenal glands unremarkable. There is no hydronephrosis on either side. Subcentimeter right renal inferior pole hypodense focus is too Synia Douglass to characterize. The visualized ureters and urinary bladder appear unremarkable. Stomach/Bowel: There is loose stool within the colon. There is no bowel obstruction or active inflammation. The appendix is not visualized with certainty. No inflammatory changes identified in the right lower quadrant. Lymphatic: No adenopathy. Reproductive: From vesicles are grossly unremarkable. No pelvic mass. Other: Midline vertical anterior pelvic wall incisional scar. Musculoskeletal: No acute or significant osseous findings. Review of the MIP images confirms the above findings. IMPRESSION: 1. No acute intrathoracic, abdominal, or pelvic pathology. No  aortic aneurysm or dissection. 2. Fatty liver. Electronically Signed   By: AAnner CreteM.D.   On: 01/26/2023 20:39   DG Chest 2 View  Result Date: 01/26/2023 CLINICAL DATA:  Chest pain and syncope. EXAM: CHEST - 2 VIEW COMPARISON:  Chest radiograph May 31 2022. FINDINGS: Patient is rotated. Cardiac monitoring leads are coiled over the chest the heart size and mediastinal contours are within normal limits. Reduced lung volumes with bibasilar atelectasis. No pleural effusion. No pneumothorax. Mild thoracic spondylosis. IMPRESSION: Reduced lung volumes with bibasilar atelectasis. Electronically Signed   By: JDahlia BailiffM.D.   On: 01/26/2023 18:34    Procedures Procedures    Medications Ordered in ED Medications  lidocaine (LIDODERM) 5 % 1 patch (1 patch  Transdermal Patch Applied 01/26/23 2129)  oxyCODONE (Oxy IR/ROXICODONE) immediate release tablet 5 mg (5 mg Oral Given 01/26/23 1652)  ondansetron (ZOFRAN-ODT) disintegrating tablet 4 mg (4 mg Oral Given 01/26/23 1652)  morphine (PF) 4 MG/ML injection 4 mg (4 mg Intravenous Given 01/26/23 1842)  sodium chloride 0.9 % bolus 1,000 mL (0 mLs Intravenous Stopped 01/26/23 1937)  iohexol (OMNIPAQUE) 350 MG/ML injection 100 mL (100 mLs Intravenous Contrast Given 01/26/23 2021)  ketorolac (TORADOL) 15 MG/ML injection 15 mg (15 mg Intravenous Given 01/26/23 2129)  cyclobenzaprine (FLEXERIL) tablet 5 mg (5 mg Oral Given 01/26/23 2128)    ED Course/ Medical Decision Making/ A&P             HEART Score: 4                Medical Decision Making Patient is a 57 year old male, here for chest, back pain, abdominal pain that is been going on for the last couple days.  He has diffuse abdominal tenderness, and complains of chest pain, we will obtain a CTA chest abdomen pelvis, to rule out a bowel obstruction, dissection, further internal etiology.  We also obtain troponins for further evaluation.  Amount and/or Complexity of Data Reviewed Labs: ordered.     Details: Troponin 4 Radiology: ordered.    Details: CTA dissection study unremarkable ECG/medicine tests:  Decision-making details documented in ED Course. Discussion of management or test interpretation with external provider(s): Discussed CTA dissection study being unremarkable patient, no internal etiology, will pain/diarrhea is likely secondary to his diagnosed IBS.  Encouraged him to follow-up with his GI doctor.  On further exam, he had tenderness to palpation of the left leg, and a positive straight leg raise, I am suspicious that he has sciatica, we will trial a lidocaine patch, Toradol, and Flexeril, as well as I will send these to the pharmacy.  His first troponin was unremarkable, however we will send a second 1 given his heart score 4, signed out to Dr. Maryan Rued, pending second troponin.  Risk Prescription drug management.   Final Clinical Impression(s) / ED Diagnoses Final diagnoses:  Generalized abdominal pain  Back pain with left-sided sciatica  Chest pain, unspecified type    Rx / DC Orders ED Discharge Orders          Ordered    cyclobenzaprine (FLEXERIL) 5 MG tablet  2 times daily PRN        01/26/23 2128    ketorolac (TORADOL) 10 MG tablet  Every 6 hours PRN        01/26/23 2128    lidocaine (HM LIDOCAINE PATCH) 4 %  Every 24 hours        01/26/23 2128              Annelle Behrendt, Si [place], [place] 01/26/23 2130    Blanchie Dessert, MD 01/27/23 [id number]

## 2023-01-26 NOTE — ED Provider Triage Note (Signed)
Emergency Medicine Provider Triage Evaluation Note  [name] , a 57 y.o. male  was evaluated in triage.  Pt complains of abdominal pain, left flank pain x 3 days. Associated nausea and diarrhea. Urinating fine, not sure if he's seen blood.  Hx of bowel obstructions. Reports a syncopal episode x 2 days ago, passed out in the bathroom for about 10 minutes, did not hit his head, not on blood thinners.   Review of Systems  Positive: Abd pain, diarrhea, nausea, vomiting, flank pain Negative: Dysuria, fever, obstipation  Physical Exam  BP 104/89 (BP Location: Left Arm)   Pulse (!) 106   Temp 98.7 F (37.1 C) (Oral)   Resp 20   Ht 6' 1"$  (1.854 m)   Wt 93.9 kg   SpO2 99%   BMI 27.31 kg/m  Gen:   Awake, no distress   Resp:  Normal effort  MSK:   Moves extremities without difficulty  Other:  Moaning in triage  Medical Decision Making  Medically screening exam initiated at 4:49 PM.  Appropriate orders placed.  [name] was informed that the remainder of the evaluation will be completed by another provider, this initial triage assessment does not replace that evaluation, and the importance of remaining in the ED until their evaluation is complete.  Workup initiated including CT scan to eval for bowel obstruction   [name] T, PA-C 01/26/23 1652

## 2023-01-26 NOTE — ED Notes (Signed)
Pt to CT

## 2023-02-04 ENCOUNTER — Ambulatory Visit: Payer: BC Managed Care – PPO | Admitting: Internal Medicine

## 2023-02-17 ENCOUNTER — Ambulatory Visit: Payer: Medicare Other | Admitting: Internal Medicine

## 2023-04-14 ENCOUNTER — Other Ambulatory Visit: Payer: Self-pay | Admitting: Internal Medicine

## 2023-04-22 ENCOUNTER — Encounter: Payer: Self-pay | Admitting: Internal Medicine

## 2023-04-22 NOTE — Patient Instructions (Addendum)
      Blood work was ordered.   The lab is on the first floor.    Medications changes include :   none      Return in about 6 months (around 10/24/2023) for Physical Exam.  

## 2023-04-22 NOTE — Progress Notes (Unsigned)
Subjective:    Patient ID: [name], male    DOB: 1966/06/19, 57 y.o.   MRN: [id number]     HPI Graylen is here for follow up of his chronic medical problems.  Still with chronic neck and back pain.  In bed most of the day - very sedentary.   Lots of GI issues - seeing Dr Leone Payor.    Trying to eat a low sugar/carb diet.  Medications and allergies reviewed with patient and updated if appropriate.  Current Outpatient Medications on File Prior to Visit  Medication Sig Dispense Refill   Cholecalciferol (VITAMIN D) 50 MCG (2000 UT) CAPS Take 1 capsule by mouth daily.     cyclobenzaprine (FLEXERIL) 5 MG tablet Take 2 tablets (10 mg total) by mouth 2 (two) times daily as needed for muscle spasms. 30 tablet 0   gabapentin (NEURONTIN) 300 MG capsule Take 1 capsule (300 mg total) by mouth 3 (three) times daily as needed. 90 capsule 2   JANUVIA 100 MG tablet Take 1 tablet (100 mg total) by mouth daily. Annual appt due in August must see provider for future refills 90 tablet 0   ketorolac (TORADOL) 10 MG tablet Take 1 tablet (10 mg total) by mouth every 6 (six) hours as needed. 20 tablet 0   Lactobacillus-Inulin (CULTURELLE DIGESTIVE DAILY PO) Take 1 capsule by mouth daily.     lidocaine (HM LIDOCAINE PATCH) 4 % Place 1 patch onto the skin daily. 30 patch 0   loratadine (CLARITIN) 10 MG tablet Take 10 mg by mouth daily.     metoCLOPramide (REGLAN) 10 MG tablet Take 1 tablet (10 mg total) by mouth every 6 (six) hours as needed for nausea (and reflux/regurgitation). 30 tablet 0   Multiple Vitamin (VITAMIN E/FOLIC ACID/B-6/B-12 PO) Take 1 tablet by mouth daily.     ondansetron (ZOFRAN) 4 MG tablet Take 1 tablet (4 mg total) by mouth every 8 (eight) hours as needed for nausea or vomiting. 30 tablet 1   pantoprazole (PROTONIX) 40 MG tablet TAKE 1 TABLET BY MOUTH EVERY DAY 90 tablet 1   polyethylene glycol powder (GLYCOLAX/MIRALAX) 17 GM/SCOOP powder Take 17 g by mouth daily. 238 g 0    rosuvastatin (CRESTOR) 5 MG tablet Take 1 tablet (5 mg total) by mouth daily. 90 tablet 3   sildenafil (REVATIO) 20 MG tablet SMARTSIG:3-5 Tablet(s) By Mouth PRN     traMADol (ULTRAM) 50 MG tablet Take 1 tablet (50 mg total) by mouth every 6 (six) hours as needed. 30 tablet 0   triamcinolone cream (KENALOG) 0.1 % APPLY 1 APPLICATION TOPICALLY 2 (TWO) TIMES DAILY AS NEEDED (RASH ON LEG). 30 g 0   vitamin C (ASCORBIC ACID) 500 MG tablet Take 500 mg by mouth daily.     No current facility-administered medications on file prior to visit.     Review of Systems  Constitutional:  Negative for fever.  Respiratory:  Negative for cough, shortness of breath and wheezing.   Cardiovascular:  Positive for chest pain (at times - ? gas - seee cardio). Negative for palpitations and leg swelling.  Gastrointestinal:  Positive for constipation.  Musculoskeletal:  Positive for back pain and neck pain.  Neurological:  Negative for light-headedness and headaches.  Psychiatric/Behavioral:  Positive for sleep disturbance.        Objective:   Vitals:   04/23/23 1037  BP: 108/72  Pulse: 69  Temp: 98 F (36.7 C)  SpO2: 98%   BP  Readings from Last 3 Encounters:  04/23/23 108/72  01/26/23 120/70  01/21/23 102/64   Wt Readings from Last 3 Encounters:  04/23/23 210 lb (95.3 kg)  01/26/23 207 lb (93.9 kg)  01/21/23 207 lb (93.9 kg)   Body mass index is 27.71 kg/m.    Physical Exam Constitutional:      General: He is not in acute distress.    Appearance: Normal appearance. He is not ill-appearing.  HENT:     Head: Normocephalic and atraumatic.  Eyes:     Conjunctiva/sclera: Conjunctivae normal.  Cardiovascular:     Rate and Rhythm: Normal rate and regular rhythm.     Heart sounds: Normal heart sounds.  Pulmonary:     Effort: Pulmonary effort is normal. No respiratory distress.     Breath sounds: Normal breath sounds. No wheezing or rales.  Musculoskeletal:     Right lower leg: No edema.      Left lower leg: No edema.  Skin:    General: Skin is warm and dry.     Findings: No rash.  Neurological:     Mental Status: He is alert. Mental status is at baseline.  Psychiatric:        Mood and Affect: Mood normal.       Diabetic Foot Exam - Simple   Simple Foot Form Visual Inspection No deformities, no ulcerations, no other skin breakdown bilaterally: Yes Sensation Testing Intact to touch and monofilament testing bilaterally: Yes Pulse Check Posterior Tibialis and Dorsalis pulse intact bilaterally: Yes Comments      Lab Results  Component Value Date   WBC 4.9 01/26/2023   HGB 16.3 01/26/2023   HCT 48.0 01/26/2023   PLT 233 01/26/2023   GLUCOSE 110 (H) 01/26/2023   CHOL 103 08/04/2022   TRIG 142.0 08/04/2022   HDL 34.00 (L) 08/04/2022   LDLDIRECT 64.0 09/30/2016   LDLCALC 40 08/04/2022   ALT 18 01/26/2023   AST 22 01/26/2023   NA 136 01/26/2023   K 3.6 01/26/2023   CL 99 01/26/2023   CREATININE 1.00 01/26/2023   BUN 14 01/26/2023   CO2 24 01/26/2023   TSH 0.92 05/02/2022   PSA 0.34 06/04/2014   INR 1.23 12/01/2009   HGBA1C 6.7 (H) 08/04/2022   MICROALBUR 1.7 09/17/2021     Assessment & Plan:    See Problem List for Assessment and Plan of chronic medical problems.

## 2023-04-23 ENCOUNTER — Ambulatory Visit (INDEPENDENT_AMBULATORY_CARE_PROVIDER_SITE_OTHER): Payer: Medicare Other | Admitting: Internal Medicine

## 2023-04-23 ENCOUNTER — Telehealth: Payer: Self-pay | Admitting: Internal Medicine

## 2023-04-23 VITALS — BP 108/72 | HR 69 | Temp 98.0°F | Ht 73.0 in | Wt 210.0 lb

## 2023-04-23 DIAGNOSIS — Z7984 Long term (current) use of oral hypoglycemic drugs: Secondary | ICD-10-CM

## 2023-04-23 DIAGNOSIS — E782 Mixed hyperlipidemia: Secondary | ICD-10-CM | POA: Diagnosis not present

## 2023-04-23 DIAGNOSIS — E1169 Type 2 diabetes mellitus with other specified complication: Secondary | ICD-10-CM | POA: Diagnosis not present

## 2023-04-23 DIAGNOSIS — K219 Gastro-esophageal reflux disease without esophagitis: Secondary | ICD-10-CM | POA: Diagnosis not present

## 2023-04-23 LAB — COMPREHENSIVE METABOLIC PANEL
ALT: 34 U/L (ref 0–53)
AST: 25 U/L (ref 0–37)
Albumin: 4.4 g/dL (ref 3.5–5.2)
Alkaline Phosphatase: 56 U/L (ref 39–117)
BUN: 10 mg/dL (ref 6–23)
CO2: 29 mEq/L (ref 19–32)
Calcium: 10.1 mg/dL (ref 8.4–10.5)
Chloride: 101 mEq/L (ref 96–112)
Creatinine, Ser: 1.09 mg/dL (ref 0.40–1.50)
GFR: 76.48 mL/min (ref >60.00)
Glucose, Bld: 112 mg/dL — ABNORMAL HIGH (ref 70–99)
Potassium: 4.4 mEq/L (ref 3.5–5.1)
Sodium: 137 mEq/L (ref 135–145)
Total Bilirubin: 1.2 mg/dL (ref 0.2–1.2)
Total Protein: 7.7 g/dL (ref 6.0–8.3)

## 2023-04-23 LAB — LIPID PANEL
Cholesterol: 82 mg/dL (ref 0–200)
HDL: 32 mg/dL — ABNORMAL LOW (ref >39.00)
LDL Cholesterol: 26 mg/dL (ref 0–99)
NonHDL: 49.64
Total CHOL/HDL Ratio: 3
Triglycerides: 120 mg/dL (ref 0.0–149.0)
VLDL: 24 mg/dL (ref 0.0–40.0)

## 2023-04-23 LAB — HEMOGLOBIN A1C: Hgb A1c MFr Bld: 6.6 % — ABNORMAL HIGH (ref 4.6–6.5)

## 2023-04-23 NOTE — Assessment & Plan Note (Signed)
Chronic   Lab Results  Component Value Date   HGBA1C 6.7 (H) 08/04/2022   Sugars controlled Check A1c, urine microalbumin today Continue Januvia 100 mg daily-has been effective and well-tolerated Stressed regular exercise, diabetic diet

## 2023-04-23 NOTE — Assessment & Plan Note (Signed)
History of hypercalcemia Recheck calcium and PTH level up

## 2023-04-23 NOTE — Assessment & Plan Note (Signed)
Chronic GERD controlled Continue pantoprazole 40 mg daily 

## 2023-04-23 NOTE — Assessment & Plan Note (Signed)
Chronic Check lipid panel, CMP Continue Crestor 5 mg daily Stressed regular exercise, healthy diet

## 2023-04-23 NOTE — Telephone Encounter (Signed)
Patient dropped off document Patient Assistance, to be filled out by provider. Patient requested to send it via Mail within ASAP. Document is located in providers tray at front office.Please advise at Mobile [phone] (mobile)

## 2023-04-24 LAB — MICROALBUMIN / CREATININE URINE RATIO
Creatinine,U: 292.4 mg/dL
Microalb Creat Ratio: 0.9 mg/g (ref 0.0–30.0)
Microalb, Ur: 2.7 mg/dL — ABNORMAL HIGH (ref 0.0–1.9)

## 2023-04-24 NOTE — Telephone Encounter (Signed)
Paperwork placed in envelop and placed in bin today to go out for mail.

## 2023-04-24 NOTE — Telephone Encounter (Signed)
Paperwork completed and placed in Dr. [name] folder to be signed.

## 2023-04-24 NOTE — Telephone Encounter (Signed)
Paperwork received.

## 2023-04-25 LAB — PTH, INTACT AND CALCIUM
Calcium: 10.2 mg/dL (ref 8.6–10.3)
PTH: 32 pg/mL (ref 16–77)

## 2023-05-01 ENCOUNTER — Other Ambulatory Visit: Payer: Self-pay | Admitting: Internal Medicine

## 2023-05-02 DIAGNOSIS — R062 Wheezing: Secondary | ICD-10-CM | POA: Diagnosis not present

## 2023-05-02 DIAGNOSIS — J060 Acute laryngopharyngitis: Secondary | ICD-10-CM | POA: Diagnosis not present

## 2023-05-02 DIAGNOSIS — R051 Acute cough: Secondary | ICD-10-CM | POA: Diagnosis not present

## 2023-05-13 ENCOUNTER — Other Ambulatory Visit: Payer: Self-pay | Admitting: Internal Medicine

## 2023-05-19 DIAGNOSIS — R052 Subacute cough: Secondary | ICD-10-CM | POA: Diagnosis not present

## 2023-05-26 ENCOUNTER — Encounter (HOSPITAL_BASED_OUTPATIENT_CLINIC_OR_DEPARTMENT_OTHER): Payer: Self-pay | Admitting: Pulmonary Disease

## 2023-05-26 ENCOUNTER — Ambulatory Visit (INDEPENDENT_AMBULATORY_CARE_PROVIDER_SITE_OTHER): Payer: Medicare Other | Admitting: Pulmonary Disease

## 2023-05-26 VITALS — BP 108/72 | HR 85 | Temp 98.0°F | Ht 72.0 in | Wt 206.2 lb

## 2023-05-26 DIAGNOSIS — G4733 Obstructive sleep apnea (adult) (pediatric): Secondary | ICD-10-CM | POA: Diagnosis not present

## 2023-05-26 DIAGNOSIS — R053 Chronic cough: Secondary | ICD-10-CM | POA: Diagnosis not present

## 2023-05-26 MED ORDER — FAMOTIDINE 20 MG PO TABS: 20.0000 mg | ORAL_TABLET | Freq: Every day | ORAL | 2 refills | Status: DC

## 2023-05-26 MED ORDER — AZELASTINE HCL 0.1 % NA SOLN: 1.0000 | Freq: Every day | NASAL | 12 refills | Status: DC

## 2023-05-26 MED ORDER — FLUTICASONE PROPIONATE 50 MCG/ACT NA SUSP: 1.0000 | Freq: Every day | NASAL | 2 refills | Status: DC

## 2023-05-26 NOTE — Patient Instructions (Signed)
Pepcid 20 mg pill nightly  Flonase 1 spray in each nostril daily  Azelastine 1 spray in each nostril daily  Will arrange for new CPAP set up  Follow up in 6 weeks

## 2023-05-26 NOTE — Progress Notes (Signed)
Pulmonary, Critical Care, and Sleep Medicine  Chief Complaint  Patient presents with   Follow-up    Patient stated he has been coughing for months.     Past Surgical History:  He  has a past surgical history that includes Colectomy (1994); laparotomy (2002, 2005, 2009); Gastrostomy (2005); Cholecystectomy (2009); Sphincterotomy (2009); Colonoscopy (07/17/2011); and Upper gastrointestinal endoscopy (07/17/2011).  Past Medical History:  Allergies, GERD, IBS, Vit D deficiency, COVID 25 November 2020   Constitutional:  BP 108/72 (BP Location: Left Arm, Patient Position: Sitting, Cuff Size: Normal)   Pulse 85   Temp 98 F (36.7 C) (Oral)   Ht 6' (1.829 m)   Wt 206 lb 3.2 oz (93.5 kg)   SpO2 98%   BMI 27.97 kg/m   Brief Summary:  [name] is a 57 y.o. male with chronic cough and obstructive sleep apnea.      Subjective:   He has persistent cough.  His throat gets irritated.  Sometimes brings up clear phlegm.  No improvement after treatment with antibiotics or nebulizer.  Has post nasal drip.  Has terrible reflux.  Has been using pantoprazole and claritin.  He has disability from neck and back injuries.  He opted against surgery.  He wears a neck collar and back brace.  Physical Exam:   Appearance - well kempt, neck collar and back brace on   ENMT - no sinus tenderness, no oral exudate, no LAN, Mallampati 3 airway, no stridor  Respiratory - equal breath sounds bilaterally, no wheezing or rales  CV - s1s2 regular rate and rhythm, no murmurs  Ext - no clubbing, no edema  Skin - no rashes  Psych - normal mood and affect   Pulmonary testing:  PFT 02/18/21 >> FEV1 3.91 (92%), FEV1% 81, TLC 7.07 (93%), DLCO 88%  Chest Imaging:  CT angio chest 01/26/23 >> basilar ATX  Sleep Tests:  HST 06/29/18 >> AHI 18.1, SaO2 low 87%  CPAP titration 02/03/22 >> CPAP 7 cm H2O >> AHI 0.5.   Social History:  He  reports that he has never smoked. He has never used smokeless  tobacco. He reports that he does not drink alcohol and does not use drugs.  Family History:  His family history includes Hypertension in his mother; Liver disease in his father.     Assessment/Plan:   Chronic cough. - likely from post nasal drip and reflux - asthma seems less likely - had normal CT chest in February 2024 - will have him use flonase and azelastine - continue pantoprazole - add pepcid 20 mg nightly  Obstructive sleep apnea. - now that he has insurance will try to get him set up with CPAP at 8 cm H2O  Time Spent Involved in Patient Care on Day of Examination:  37 minutes  Follow up:   Patient Instructions  Pepcid 20 mg pill nightly  Flonase 1 spray in each nostril daily  Azelastine 1 spray in each nostril daily  Will arrange for new CPAP set up  Follow up in 6 weeks  Medication List:   Allergies as of 05/26/2023       Reactions   Metformin And Related    diarrhea        Medication List        Accurate as of May 26, 2023  3:01 PM. If you have any questions, ask your nurse or doctor.          STOP taking these medications  azithromycin 250 MG tablet Commonly known as: ZITHROMAX Stopped by: Coralyn Helling, MD       TAKE these medications    ascorbic acid 500 MG tablet Commonly known as: VITAMIN C Take 500 mg by mouth daily.   azelastine 0.1 % nasal spray Commonly known as: ASTELIN Place 1 spray into both nostrils daily. Use in each nostril as directed Started by: Coralyn Helling, MD   benzonatate 100 MG capsule Commonly known as: TESSALON Take by mouth.   CULTURELLE DIGESTIVE DAILY PO Take 1 capsule by mouth daily.   cyclobenzaprine 5 MG tablet Commonly known as: FLEXERIL Take 2 tablets (10 mg total) by mouth 2 (two) times daily as needed for muscle spasms.   famotidine 20 MG tablet Commonly known as: Pepcid Take 1 tablet (20 mg total) by mouth at bedtime. Started by: Coralyn Helling, MD   fluticasone 50 MCG/ACT nasal  spray Commonly known as: FLONASE Place 1 spray into both nostrils daily. Started by: Coralyn Helling, MD   gabapentin 300 MG capsule Commonly known as: NEURONTIN Take 1 capsule (300 mg total) by mouth 3 (three) times daily as needed.   Januvia 100 MG tablet Generic drug: sitaGLIPtin Take 1 tablet (100 mg total) by mouth daily. Annual appt due in August must see provider for future refills   ketorolac 10 MG tablet Commonly known as: TORADOL Take 1 tablet (10 mg total) by mouth every 6 (six) hours as needed.   lidocaine 4 % Commonly known as: HM Lidocaine Patch Place 1 patch onto the skin daily.   loratadine 10 MG tablet Commonly known as: CLARITIN Take 10 mg by mouth daily.   metoCLOPramide 10 MG tablet Commonly known as: Reglan Take 1 tablet (10 mg total) by mouth every 6 (six) hours as needed for nausea (and reflux/regurgitation).   ondansetron 4 MG tablet Commonly known as: ZOFRAN Take 1 tablet (4 mg total) by mouth every 8 (eight) hours as needed for nausea or vomiting.   pantoprazole 40 MG tablet Commonly known as: PROTONIX TAKE 1 TABLET BY MOUTH EVERY DAY   polyethylene glycol powder 17 GM/SCOOP powder Commonly known as: GLYCOLAX/MIRALAX Take 17 g by mouth daily.   rosuvastatin 5 MG tablet Commonly known as: CRESTOR Take 1 tablet (5 mg total) by mouth daily.   sildenafil 20 MG tablet Commonly known as: REVATIO TAKE 3 TO 5 TABLETS BY MOUTH AS NEEDED   traMADol 50 MG tablet Commonly known as: ULTRAM Take 1 tablet (50 mg total) by mouth every 6 (six) hours as needed.   triamcinolone cream 0.1 % Commonly known as: KENALOG APPLY 1 APPLICATION TOPICALLY 2 (TWO) TIMES DAILY AS NEEDED (RASH ON LEG).   Vitamin D 50 MCG (2000 UT) Caps Take 1 capsule by mouth daily.   VITAMIN E/FOLIC ACID/B-6/B-12 PO Take 1 tablet by mouth daily.        Signature:  Coralyn Helling, MD [facility] Pulmonary/Critical Care Pager - [phone] 05/26/2023, 3:01 PM

## 2023-06-15 ENCOUNTER — Telehealth: Payer: Self-pay | Admitting: Cardiology

## 2023-06-15 NOTE — Telephone Encounter (Signed)
Patient states not feeling well . Went to Dr Craige Cotta and was given  ATB, inhaler, Had xrays previously as well. X-rays done with no issues.  Cough is improving but not stopping. Not heavy, but uncomfortable.  and chest discomfort.  Feels like pushing on the left side sometimes. Fells like ne needs  Very tired.  He is bed bound. He doesn't think it is cardiac related, but wants to be sure. He has seen PCP and pulmonary doctor.  Advised to continue with follow ups with pulmonary. Will send for any recommendations from Dr Servando Salina

## 2023-06-15 NOTE — Telephone Encounter (Signed)
Pt states that the would like a callback regarding coughing and chest discomfort. Please advise

## 2023-06-18 NOTE — Telephone Encounter (Signed)
Tobb, Kardie, DO  Cv Div Nl Triage; Jeb Levering M, RN18 minutes ago (3:07 PM)    No further recommendations - his symptoms does not suggest cardiac etiology.    Gave the pt the information above. He verbalized understanding. He plans to follow up with his PCP.

## 2023-06-23 ENCOUNTER — Encounter: Payer: Self-pay | Admitting: Internal Medicine

## 2023-06-23 ENCOUNTER — Ambulatory Visit (INDEPENDENT_AMBULATORY_CARE_PROVIDER_SITE_OTHER): Payer: Medicare Other | Admitting: Internal Medicine

## 2023-06-23 VITALS — BP 120/70 | HR 100 | Temp 97.9°F | Ht 72.0 in | Wt 207.0 lb

## 2023-06-23 DIAGNOSIS — N452 Orchitis: Secondary | ICD-10-CM

## 2023-06-23 DIAGNOSIS — R052 Subacute cough: Secondary | ICD-10-CM | POA: Diagnosis not present

## 2023-06-23 DIAGNOSIS — E559 Vitamin D deficiency, unspecified: Secondary | ICD-10-CM

## 2023-06-23 DIAGNOSIS — F520 Hypoactive sexual desire disorder: Secondary | ICD-10-CM | POA: Diagnosis not present

## 2023-06-23 DIAGNOSIS — M5136 Other intervertebral disc degeneration, lumbar region: Secondary | ICD-10-CM | POA: Insufficient documentation

## 2023-06-23 DIAGNOSIS — R21 Rash and other nonspecific skin eruption: Secondary | ICD-10-CM

## 2023-06-23 MED ORDER — LEVOFLOXACIN 500 MG PO TABS: 500.0000 mg | ORAL_TABLET | Freq: Every day | ORAL | 0 refills | Status: AC

## 2023-06-23 NOTE — Progress Notes (Signed)
Patient ID: [name], male   DOB: 1966/08/05, 57 y.o.   MRN: [id number]        Chief Complaint: follow up cough, left testicle pain,low sex desire, right leg rash, low vit d       HPI:  [name] is a 57 y.o. male here with c/o 1 mo ongoing cough scant prod worse in the past wk with mild sob, no wheezing.  Pt denies chest pain, orthopnea, PND, increased LE swelling, palpitations, dizziness or syncope.  Does also incidentally have 3-4 days onset left testicle pain, swelling tender   Also with several months low sex desire, asking for testosterone level.  Also has 3-4 mo onset pretibial right leg itchy rash, asking for derm referral.         Wt Readings from Last 3 Encounters:  06/23/23 207 lb (93.9 kg)  05/26/23 206 lb 3.2 oz (93.5 kg)  04/23/23 210 lb (95.3 kg)   BP Readings from Last 3 Encounters:  06/23/23 120/70  05/26/23 108/72  04/23/23 108/72         Past Medical History:  Diagnosis Date   Allergy    Chest wall pain    Cough    Diabetes (HCC)    Diarrhea recurrent   GERD (gastroesophageal reflux disease)    Headache(784.0)    Irritable bowel syndrome    Obstructive sleep apnea    apnealink 10/23/10 AHI 5, Sp02 low 87%   Postural lightheadedness    SBO (small bowel obstruction) (HCC) 12/2015   Sleep apnea    11/27/21 will get new test   Past Surgical History:  Procedure Laterality Date   CHOLECYSTECTOMY  2009   COLECTOMY  1994   segmental   COLONOSCOPY  07/17/2011   hemorrhoids, otherwise normal including terminal ileum and random colon biopises   GASTROSTOMY  2005   stamm   LAPAROTOMY  2002, 2005, 2009   SPHINCTEROTOMY  2009   lateral internal   UPPER GASTROINTESTINAL ENDOSCOPY  07/17/2011   normal, including duodenal biopsies    reports that he has never smoked. He has never used smokeless tobacco. He reports that he does not drink alcohol and does not use drugs. family history includes Hypertension in his mother; Liver disease in his  father. Allergies  Allergen Reactions   Metformin And Related     diarrhea   Current Outpatient Medications on File Prior to Visit  Medication Sig Dispense Refill   albuterol (VENTOLIN HFA) 108 (90 Base) MCG/ACT inhaler Inhale 2 puffs into the lungs every 6 (six) hours as needed.     azelastine (ASTELIN) 0.1 % nasal spray Place 1 spray into both nostrils daily. Use in each nostril as directed 30 mL 12   benzonatate (TESSALON) 100 MG capsule Take by mouth.     Cholecalciferol (VITAMIN D) 50 MCG (2000 UT) CAPS Take 1 capsule by mouth daily.     cyclobenzaprine (FLEXERIL) 5 MG tablet Take 2 tablets (10 mg total) by mouth 2 (two) times daily as needed for muscle spasms. 30 tablet 0   famotidine (PEPCID) 20 MG tablet Take 1 tablet (20 mg total) by mouth at bedtime. 30 tablet 2   fluticasone (FLONASE) 50 MCG/ACT nasal spray Place 1 spray into both nostrils daily. 16 g 2   gabapentin (NEURONTIN) 300 MG capsule Take 1 capsule (300 mg total) by mouth 3 (three) times daily as needed. 90 capsule 2   JANUVIA 100 MG tablet Take 1 tablet (100 mg total) by  mouth daily. Annual appt due in August must see provider for future refills 90 tablet 0   ketorolac (TORADOL) 10 MG tablet Take 1 tablet (10 mg total) by mouth every 6 (six) hours as needed. 20 tablet 0   Lactobacillus-Inulin (CULTURELLE DIGESTIVE DAILY PO) Take 1 capsule by mouth daily.     lidocaine (HM LIDOCAINE PATCH) 4 % Place 1 patch onto the skin daily. 30 patch 0   loratadine (CLARITIN) 10 MG tablet Take 10 mg by mouth daily.     metoCLOPramide (REGLAN) 10 MG tablet Take 1 tablet (10 mg total) by mouth every 6 (six) hours as needed for nausea (and reflux/regurgitation). 30 tablet 0   Multiple Vitamin (VITAMIN E/FOLIC ACID/B-6/B-12 PO) Take 1 tablet by mouth daily.     ondansetron (ZOFRAN) 4 MG tablet Take 1 tablet (4 mg total) by mouth every 8 (eight) hours as needed for nausea or vomiting. 30 tablet 1   pantoprazole (PROTONIX) 40 MG tablet TAKE 1  TABLET BY MOUTH EVERY DAY 90 tablet 1   polyethylene glycol powder (GLYCOLAX/MIRALAX) 17 GM/SCOOP powder Take 17 g by mouth daily. 238 g 0   rosuvastatin (CRESTOR) 5 MG tablet Take 1 tablet (5 mg total) by mouth daily. 90 tablet 3   sildenafil (REVATIO) 20 MG tablet TAKE 3 TO 5 TABLETS BY MOUTH AS NEEDED 5 tablet 5   traMADol (ULTRAM) 50 MG tablet Take 1 tablet (50 mg total) by mouth every 6 (six) hours as needed. 30 tablet 0   triamcinolone cream (KENALOG) 0.1 % APPLY 1 APPLICATION TOPICALLY 2 (TWO) TIMES DAILY AS NEEDED (RASH ON LEG). 30 g 0   vitamin C (ASCORBIC ACID) 500 MG tablet Take 500 mg by mouth daily.     No current facility-administered medications on file prior to visit.        ROS:  All others reviewed and negative.  Objective        PE:  BP 120/70 (BP Location: Right Arm, Patient Position: Sitting, Cuff Size: Normal)   Pulse 100   Temp 97.9 F (36.6 C) (Oral)   Ht 6' (1.829 m)   Wt 207 lb (93.9 kg)   SpO2 97%   BMI 28.07 kg/m                 Constitutional: Pt appears in NAD               HENT: Head: NCAT.                Right Ear: External ear normal.                 Left Ear: External ear normal.                Eyes: . Pupils are equal, round, and reactive to light. Conjunctivae and EOM are normal               Nose: without d/c or deformity               Neck: Neck supple. Gross normal ROM               Cardiovascular: Normal rate and regular rhythm.                 Pulmonary/Chest: Effort normal and breath sounds without rales or wheezing.                Abd:  Soft, NT, ND, + BS, no organomegaly, GU with  left testicle 1+ tender swelling               Neurological: Pt is alert. At baseline orientation, motor grossly intact               Skin: Skin is warm., LE edema - none               Psychiatric: Pt behavior is normal without agitation   Micro: none  Cardiac tracings I have personally interpreted today:  none  Pertinent Radiological findings (summarize):  none   Lab Results  Component Value Date   WBC 4.9 01/26/2023   HGB 16.3 01/26/2023   HCT 48.0 01/26/2023   PLT 233 01/26/2023   GLUCOSE 112 (H) 04/23/2023   CHOL 82 04/23/2023   TRIG 120.0 04/23/2023   HDL 32.00 (L) 04/23/2023   LDLDIRECT 64.0 09/30/2016   LDLCALC 26 04/23/2023   ALT 34 04/23/2023   AST 25 04/23/2023   NA 137 04/23/2023   K 4.4 04/23/2023   CL 101 04/23/2023   CREATININE 1.09 04/23/2023   BUN 10 04/23/2023   CO2 29 04/23/2023   TSH 0.92 05/02/2022   PSA 0.34 06/04/2014   INR 1.23 12/01/2009   HGBA1C 6.6 (H) 04/23/2023   MICROALBUR 2.7 (H) 04/23/2023   Assessment/Plan:  Mcdonald A Scheck is a 57 y.o. Other or two or more races [6] male with  has a past medical history of Allergy, Chest wall pain, Cough, Diabetes (HCC), Diarrhea (recurrent), GERD (gastroesophageal reflux disease), Headache(784.0), Irritable bowel syndrome, Obstructive sleep apnea, Postural lightheadedness, SBO (small bowel obstruction) (HCC) (12/2015), and Sleep apnea.  Cough Etiology unclear, declines cxr for now, has seen pulm and UC recently, for cough med prn, also levaquin course as above  Lack of sexual desire ? Psychological - also for testosterone level, consider counseling referral  Orchitis of left testicle Mild to mod, for antibx course levaquin 500 mg,  to f/u any worsening symptoms or concerns  Rash C/w dermatitis etiology unclear, for derm referral per pt reqeuest  Vitamin D deficiency Last vitamin D Lab Results  Component Value Date   VD25OH 45.35 05/31/2022   Stable, cont oral replacement  Followup: Return if symptoms worsen or fail to improve.  Oliver Barre, MD 06/25/2023 8:19 PM [facility] Internal Medicine

## 2023-06-23 NOTE — Patient Instructions (Signed)
Please take all new medication as prescribed -the antibiotic  Please continue all other medications as before, and refills have been done if requested.  Please have the pharmacy call with any other refills you may need.  Please keep your appointments with your specialists as you may have planned  You will be contacted regarding the referral for: dermatology  Please go to the LAB at the blood drawing area for the tests to be done - just the testosterone level today  You will be contacted by phone if any changes need to be made immediately.  Otherwise, you will receive a letter about your results with an explanation, but please check with MyChart first.

## 2023-06-24 LAB — TESTOSTERONE: Testosterone: 305.98 ng/dL (ref 300.00–890.00)

## 2023-06-25 ENCOUNTER — Encounter: Payer: Self-pay | Admitting: Internal Medicine

## 2023-06-25 DIAGNOSIS — R059 Cough, unspecified: Secondary | ICD-10-CM | POA: Insufficient documentation

## 2023-06-25 DIAGNOSIS — N452 Orchitis: Secondary | ICD-10-CM | POA: Insufficient documentation

## 2023-06-25 DIAGNOSIS — R21 Rash and other nonspecific skin eruption: Secondary | ICD-10-CM | POA: Insufficient documentation

## 2023-06-25 NOTE — Assessment & Plan Note (Signed)
?   Psychological - also for testosterone level, consider counseling referral

## 2023-06-25 NOTE — Assessment & Plan Note (Signed)
Etiology unclear, declines cxr for now, has seen pulm and UC recently, for cough med prn, also levaquin course as above

## 2023-06-25 NOTE — Assessment & Plan Note (Signed)
Mild to mod, for antibx course levaquin 500 mg,  to f/u any worsening symptoms or concerns

## 2023-06-25 NOTE — Assessment & Plan Note (Signed)
C/w dermatitis etiology unclear, for derm referral per pt reqeuest

## 2023-06-25 NOTE — Assessment & Plan Note (Signed)
Last vitamin D Lab Results  Component Value Date   VD25OH 45.35 05/31/2022   Stable, cont oral replacement

## 2023-07-21 DIAGNOSIS — L918 Other hypertrophic disorders of the skin: Secondary | ICD-10-CM | POA: Diagnosis not present

## 2023-07-21 DIAGNOSIS — L298 Other pruritus: Secondary | ICD-10-CM | POA: Diagnosis not present

## 2023-08-12 ENCOUNTER — Ambulatory Visit (INDEPENDENT_AMBULATORY_CARE_PROVIDER_SITE_OTHER): Payer: Medicare Other

## 2023-08-12 VITALS — Ht 72.0 in | Wt 210.0 lb

## 2023-08-12 DIAGNOSIS — Z0000 Encounter for general adult medical examination without abnormal findings: Secondary | ICD-10-CM | POA: Diagnosis not present

## 2023-08-12 NOTE — Patient Instructions (Signed)
[name] , Thank you for taking time to come for your Medicare Wellness Visit. I appreciate your ongoing commitment to your health goals. Please review the following plan we discussed and let me know if I can assist you in the future.   Referrals/Orders/Follow-Ups/Clinician Recommendations: No  This is a list of the screening recommended for you and due dates:  Health Maintenance  Topic Date Due   COVID-19 Vaccine (1) Never done   Eye exam for diabetics  11/28/2022   Flu Shot  07/09/2023   Complete foot exam   08/05/2023   Hemoglobin A1C  10/24/2023   Yearly kidney function blood test for diabetes  04/22/2024   Yearly kidney health urinalysis for diabetes  04/22/2024   Medicare Annual Wellness Visit  08/11/2024   Colon Cancer Screening  10/28/2028   DTaP/Tdap/Td vaccine (3 - Td or Tdap) 08/04/2032   Hepatitis C Screening  Completed   HIV Screening  Completed   HPV Vaccine  Aged Out   Zoster (Shingles) Vaccine  Discontinued    Advanced directives: (Declined) Advance directive discussed with you today. Even though you declined this today, please call our office should you change your mind, and we can give you the proper paperwork for you to fill out.  Next Medicare Annual Wellness Visit scheduled for next year: Yes

## 2023-08-12 NOTE — Progress Notes (Signed)
Subjective:   [name] is a 57 y.o. male who presents for an Initial Medicare Annual Wellness Visit.  Visit Complete: Virtual  I connected with  [name] on 08/12/23 by a audio enabled telemedicine application and verified that I am speaking with the correct person using two identifiers.  Patient Location: Home  Provider Location: Office/Clinic  I discussed the limitations of evaluation and management by telemedicine. The patient expressed understanding and agreed to proceed.  Patient Medicare AWV questionnaire was completed by the patient on 08/11/2023; I have confirmed that all information answered by patient is correct and no changes since this date.  Review of Systems     Cardiac Risk Factors include: advanced age (>70men, >42 women);diabetes mellitus;dyslipidemia;family history of premature cardiovascular disease;male gender;sedentary lifestyle     Objective:    Today's Vitals   08/12/23 1450 08/12/23 1452  Weight: 210 lb (95.3 kg)   Height: 6' (1.829 m)   PainSc: 5  5   PainLoc: Back    Body mass index is 28.48 kg/m.     08/12/2023    2:53 PM 01/26/2023    4:47 PM 05/31/2022   12:16 PM 02/03/2022   10:51 PM 04/09/2018    8:26 PM 08/06/2016    3:33 AM 08/05/2016    9:27 PM  Advanced Directives  Does Patient Have a Medical Advance Directive? No No No No No No No  Would patient like information on creating a medical advance directive? No - Patient declined  No - Patient declined No - Patient declined No - Patient declined No - patient declined information No - patient declined information    Current Medications (verified) Outpatient Encounter Medications as of 08/12/2023  Medication Sig   albuterol (VENTOLIN HFA) 108 (90 Base) MCG/ACT inhaler Inhale 2 puffs into the lungs every 6 (six) hours as needed.   azelastine (ASTELIN) 0.1 % nasal spray Place 1 spray into both nostrils daily. Use in each nostril as directed   benzonatate (TESSALON) 100 MG capsule Take  by mouth.   Cholecalciferol (VITAMIN D) 50 MCG (2000 UT) CAPS Take 1 capsule by mouth daily.   cyclobenzaprine (FLEXERIL) 5 MG tablet Take 2 tablets (10 mg total) by mouth 2 (two) times daily as needed for muscle spasms.   famotidine (PEPCID) 20 MG tablet Take 1 tablet (20 mg total) by mouth at bedtime.   fluticasone (FLONASE) 50 MCG/ACT nasal spray Place 1 spray into both nostrils daily.   gabapentin (NEURONTIN) 300 MG capsule Take 1 capsule (300 mg total) by mouth 3 (three) times daily as needed.   JANUVIA 100 MG tablet Take 1 tablet (100 mg total) by mouth daily. Annual appt due in August must see provider for future refills   ketorolac (TORADOL) 10 MG tablet Take 1 tablet (10 mg total) by mouth every 6 (six) hours as needed.   Lactobacillus-Inulin (CULTURELLE DIGESTIVE DAILY PO) Take 1 capsule by mouth daily.   lidocaine (HM LIDOCAINE PATCH) 4 % Place 1 patch onto the skin daily.   loratadine (CLARITIN) 10 MG tablet Take 10 mg by mouth daily.   metoCLOPramide (REGLAN) 10 MG tablet Take 1 tablet (10 mg total) by mouth every 6 (six) hours as needed for nausea (and reflux/regurgitation).   Multiple Vitamin (VITAMIN E/FOLIC ACID/B-6/B-12 PO) Take 1 tablet by mouth daily.   ondansetron (ZOFRAN) 4 MG tablet Take 1 tablet (4 mg total) by mouth every 8 (eight) hours as needed for nausea or vomiting.   pantoprazole (PROTONIX)  40 MG tablet TAKE 1 TABLET BY MOUTH EVERY DAY   polyethylene glycol powder (GLYCOLAX/MIRALAX) 17 GM/SCOOP powder Take 17 g by mouth daily.   rosuvastatin (CRESTOR) 5 MG tablet Take 1 tablet (5 mg total) by mouth daily.   sildenafil (REVATIO) 20 MG tablet TAKE 3 TO 5 TABLETS BY MOUTH AS NEEDED   traMADol (ULTRAM) 50 MG tablet Take 1 tablet (50 mg total) by mouth every 6 (six) hours as needed.   triamcinolone cream (KENALOG) 0.1 % APPLY 1 APPLICATION TOPICALLY 2 (TWO) TIMES DAILY AS NEEDED (RASH ON LEG).   vitamin C (ASCORBIC ACID) 500 MG tablet Take 500 mg by mouth daily.   No  facility-administered encounter medications on file as of 08/12/2023.    Allergies (verified) Metformin and related   History: Past Medical History:  Diagnosis Date   Allergy    Chest wall pain    Cough    Diabetes (HCC)    Diarrhea recurrent   GERD (gastroesophageal reflux disease)    Headache(784.0)    Irritable bowel syndrome    Obstructive sleep apnea    apnealink 10/23/10 AHI 5, Sp02 low 87%   Postural lightheadedness    SBO (small bowel obstruction) (HCC) 12/2015   Sleep apnea    11/27/21 will get new test   Past Surgical History:  Procedure Laterality Date   CHOLECYSTECTOMY  2009   COLECTOMY  1994   segmental   COLONOSCOPY  07/17/2011   hemorrhoids, otherwise normal including terminal ileum and random colon biopises   GASTROSTOMY  2005   stamm   LAPAROTOMY  2002, 2005, 2009   SPHINCTEROTOMY  2009   lateral internal   UPPER GASTROINTESTINAL ENDOSCOPY  07/17/2011   normal, including duodenal biopsies   Family History  Problem Relation Age of Onset   Hypertension Mother    Liver disease Father        liver cancer???   Colon cancer Neg Hx    Esophageal cancer Neg Hx    Rectal cancer Neg Hx    Stomach cancer Neg Hx    Social History   Socioeconomic History   Marital status: Married    Spouse name: Not on file   Number of children: 4   Years of education: Not on file   Highest education level: Associate degree: occupational, Scientist, product/process development, or vocational program  Occupational History   Occupation: FORK LIFT DRIVER    Employer: [employer]    Comment: 11/27/21 out of work  Tobacco Use   Smoking status: Never   Smokeless tobacco: Never  Vaping Use   Vaping status: Never Used  Substance and Sexual Activity   Alcohol use: Never   Drug use: Never   Sexual activity: Not on file  Other Topics Concern   Not on file  Social History Narrative   Occupation: Group Lead Scientist, research (physical sciences) (deliveries)-disabled in 2022   Patient has never smoked.   Alcohol Use-no    Illicit Drug Use-no   Married-Divorced-Married again 2009-    4 children 2 sons and 2 daughters   [place] heritage   No regular exercise   Social Determinants of Health   Financial Resource Strain: Low Risk  (08/12/2023)   Overall Financial Resource Strain (CARDIA)    Difficulty of Paying Living Expenses: Not very hard  Food Insecurity: No Food Insecurity (08/12/2023)   Hunger Vital Sign    Worried About Running Out of Food in the Last Year: Never true    Ran Out of Food in  the Last Year: Never true  Transportation Needs: No Transportation Needs (08/12/2023)   PRAPARE - Administrator, Civil Service (Medical): No    Lack of Transportation (Non-Medical): No  Physical Activity: Inactive (08/12/2023)   Exercise Vital Sign    Days of Exercise per Week: 0 days    Minutes of Exercise per Session: 0 min  Stress: No Stress Concern Present (08/12/2023)   [employer] of Occupational Health - Occupational Stress Questionnaire    Feeling of Stress : Not at all  Social Connections: Unknown (08/12/2023)   Social Connection and Isolation Panel [NHANES]    Frequency of Communication with Friends and Family: Once a week    Frequency of Social Gatherings with Friends and Family: Once a week    Attends Religious Services: Not on Marketing executive or Organizations: No    Attends Banker Meetings: Never    Marital Status: Married    Tobacco Counseling Counseling given: Not Answered   Clinical Intake:  Pre-visit preparation completed: Yes  Pain : 0-10 Pain Score: 5  Pain Type: Chronic pain Pain Location: Back Pain Orientation: Mid, Upper, Lower     BMI - recorded: 28.48 Nutritional Status: BMI 25 -29 Overweight Nutritional Risks: None Diabetes: Yes CBG done?: No Did pt. bring in CBG monitor from home?: No  How often do you need to have someone help you when you read instructions, pamphlets, or other written materials from your doctor or  pharmacy?: 1 - Never What is the last grade level you completed in school?: HSG  Interpreter Needed?: No  Information entered by :: Susie Cassette, LPN.   Activities of Daily Living    08/12/2023    3:07 PM 08/11/2023    5:19 PM  In your present state of health, do you have any difficulty performing the following activities:  Hearing? 0 0  Vision? 0 0  Difficulty concentrating or making decisions? 1 1  Walking or climbing stairs? 1 1  Dressing or bathing? 0 0  Doing errands, shopping? 1 1  Preparing Food and eating ? Y Y  Using the Toilet? N N  In the past six months, have you accidently leaked urine? N N  Do you have problems with loss of bowel control? N N  Managing your Medications? N N  Managing your Finances? N N  Housekeeping or managing your Housekeeping? Malvin Johns    Patient Care Team: [name], MD as PCP - General (Internal Medicine) Thomasene Ripple, DO as PCP - Cardiology (Cardiology) Iva Boop, MD as Consulting Physician (Gastroenterology) Shea Evans Genice Rouge as Consulting Physician (Optometry)  Indicate any recent Medical Services you may have received from other than [facility] providers in the past year (date may be approximate).     Assessment:   This is a routine wellness examination for [name].  Hearing/Vision screen Hearing Screening - Comments:: Patient denied any hearing difficulty.   No hearing aids.   Vision Screening - Comments:: Patient does wear corrective lenses/contacts.  Annual eye exam done by: London Sheer, OD.   Dietary issues and exercise activities discussed:     Goals Addressed             This Visit's Progress    Client understands the importance of follow-up with providers by attending scheduled visits        Depression Screen    08/12/2023    3:04 PM 06/23/2023    2:18 PM 08/04/2022  2:34 PM 02/04/2022    1:46 PM 10/15/2020   11:12 AM 10/10/2019    1:59 PM 10/06/2018    7:57 AM  PHQ 2/9 Scores  PHQ - 2 Score 0 0 0 0 0 0 0   PHQ- 9 Score 6 6 4     0    Fall Risk    08/12/2023    3:06 PM 08/11/2023    5:19 PM 06/23/2023    2:18 PM 08/04/2022    2:33 PM 02/04/2022    1:46 PM  Fall Risk   Falls in the past year? 1 1 0 0 0  Number falls in past yr: 0 0 0 0   Injury with Fall? 0 0 0 0   Risk for fall due to :   No Fall Risks No Fall Risks   Follow up Education provided;Falls prevention discussed;Falls evaluation completed  Falls evaluation completed Falls evaluation completed     MEDICARE RISK AT HOME: Medicare Risk at Home Any stairs in or around the home?: Yes If so, are there any without handrails?: No Home free of loose throw rugs in walkways, pet beds, electrical cords, etc?: No Adequate lighting in your home to reduce risk of falls?: Yes Life alert?: No Use of a cane, walker or w/c?: No Grab bars in the bathroom?: No Shower chair or bench in shower?: Yes Elevated toilet seat or a handicapped toilet?: Yes  TIMED UP AND GO:  Was the test performed? No    Cognitive Function:        08/12/2023    3:08 PM  6CIT Screen  What Year? 0 points  What month? 0 points  What time? 0 points  Count back from 20 0 points  Months in reverse 0 points  Repeat phrase 0 points  Total Score 0 points    Immunizations Immunization History  Administered Date(s) Administered   Influenza Inj Mdck Quad Pf 08/25/2018, 08/29/2021   Influenza Split 08/10/2017   Influenza Whole 09/07/2010   Influenza,inj,Quad PF,6+ Mos 08/18/2017, 08/29/2019, 08/29/2020, 08/04/2022   Influenza-Unspecified 08/20/2016   Pneumococcal Polysaccharide-23 12/04/2016   Tdap 04/17/2011, 08/04/2022   Zoster Recombinant(Shingrix) 10/06/2018    TDAP status: Up to date  Flu Vaccine status: Due, Education has been provided regarding the importance of this vaccine. Advised may receive this vaccine at local pharmacy or Health Dept. Aware to provide a copy of the vaccination record if obtained from local pharmacy or Health Dept. Verbalized  acceptance and understanding.  Pneumococcal vaccine status: Never done  Covid-19 vaccine status: Declined, Education has been provided regarding the importance of this vaccine but patient still declined. Advised may receive this vaccine at local pharmacy or Health Dept.or vaccine clinic. Aware to provide a copy of the vaccination record if obtained from local pharmacy or Health Dept. Verbalized acceptance and understanding.  Qualifies for Shingles Vaccine? Yes   Zostavax completed No   Shingrix Completed?: No.    Education has been provided regarding the importance of this vaccine. Patient has been advised to call insurance company to determine out of pocket expense if they have not yet received this vaccine. Advised may also receive vaccine at local pharmacy or Health Dept. Verbalized acceptance and understanding.  Screening Tests Health Maintenance  Topic Date Due   COVID-19 Vaccine (1) Never done   OPHTHALMOLOGY EXAM  11/28/2022   INFLUENZA VACCINE  07/09/2023   FOOT EXAM  08/05/2023   HEMOGLOBIN A1C  10/24/2023   Diabetic kidney evaluation - eGFR measurement  04/22/2024   Diabetic kidney evaluation - Urine ACR  04/22/2024   Medicare Annual Wellness (AWV)  08/11/2024   Colonoscopy  10/28/2028   DTaP/Tdap/Td (3 - Td or Tdap) 08/04/2032   Hepatitis C Screening  Completed   HIV Screening  Completed   HPV VACCINES  Aged Out   Zoster Vaccines- Shingrix  Discontinued    Health Maintenance  Health Maintenance Due  Topic Date Due   COVID-19 Vaccine (1) Never done   OPHTHALMOLOGY EXAM  11/28/2022   INFLUENZA VACCINE  07/09/2023   FOOT EXAM  08/05/2023    Colorectal cancer screening: Type of screening: Colonoscopy. Completed 10/28/2018. Repeat every 10 years  Lung Cancer Screening: (Low Dose CT Chest recommended if Age 97-80 years, 20 pack-year currently smoking OR have quit w/in 15years.) does not qualify.   Lung Cancer Screening Referral: no  Additional Screening:  Hepatitis  C Screening: does qualify; Completed 09/30/2016  Vision Screening: Recommended annual ophthalmology exams for early detection of glaucoma and other disorders of the eye. Is the patient up to date with their annual eye exam?  Yes  Who is the provider or what is the name of the office in which the patient attends annual eye exams? London Sheer, OD. If pt is not established with a provider, would they like to be referred to a provider to establish care? No .   Dental Screening: Recommended annual dental exams for proper oral hygiene  Diabetic Foot Exam: Diabetic Foot Exam: Overdue, Pt has been advised about the importance in completing this exam. Pt is scheduled for diabetic foot exam on 08/18/2023.  Community Resource Referral / Chronic Care Management: CRR required this visit?  No   CCM required this visit?  No    Plan:     I have personally reviewed and noted the following in the patient's chart:   Medical and social history Use of alcohol, tobacco or illicit drugs  Current medications and supplements including opioid prescriptions. Patient is not currently taking opioid prescriptions. Functional ability and status Nutritional status Physical activity Advanced directives List of other physicians Hospitalizations, surgeries, and ER visits in previous 12 months Vitals Screenings to include cognitive, depression, and falls Referrals and appointments  In addition, I have reviewed and discussed with patient certain preventive protocols, quality metrics, and best practice recommendations. A written personalized care plan for preventive services as well as general preventive health recommendations were provided to patient.     Mickeal Needy, LPN   0/12/270   After Visit Summary: (Mail) Due to this being a telephonic visit, the after visit summary with patients personalized plan was offered to patient via mail   Nurse Notes: Normal cognitive status assessed by direct observation  via telephone conversation by this Nurse Health Advisor. No abnormalities found.

## 2023-08-17 ENCOUNTER — Other Ambulatory Visit (HOSPITAL_BASED_OUTPATIENT_CLINIC_OR_DEPARTMENT_OTHER): Payer: Self-pay | Admitting: Pulmonary Disease

## 2023-08-17 NOTE — Progress Notes (Unsigned)
Subjective:    Patient ID: [name], male    DOB: [birth year], 57 y.o.   MRN: [id number]     HPI [name] is here for a physical exam and his chronic medical problems.  Dry mouth and eyes, skin dry  Tries to go to bed at 10:30-11 - sometimes falls asleep at 2:30.  Often wakes up at 5 to go to the bathroom and then will go back to sleep till 8-9.  Does not sleep during the day.  Does not do much during the day because of his pain.  Medications and allergies reviewed with patient and updated if appropriate.  Current Outpatient Medications on File Prior to Visit  Medication Sig Dispense Refill   albuterol (VENTOLIN HFA) 108 (90 Base) MCG/ACT inhaler Inhale 2 puffs into the lungs every 6 (six) hours as needed.     azelastine (ASTELIN) 0.1 % nasal spray Place 1 spray into both nostrils daily. Use in each nostril as directed 30 mL 12   benzonatate (TESSALON) 100 MG capsule Take by mouth.     Cholecalciferol (VITAMIN D) 50 MCG (2000 UT) CAPS Take 1 capsule by mouth daily.     cyclobenzaprine (FLEXERIL) 5 MG tablet Take 2 tablets (10 mg total) by mouth 2 (two) times daily as needed for muscle spasms. 30 tablet 0   famotidine (PEPCID) 20 MG tablet TAKE 1 TABLET BY MOUTH EVERYDAY AT BEDTIME 30 tablet 2   fluticasone (FLONASE) 50 MCG/ACT nasal spray Place 1 spray into both nostrils daily. 16 g 2   gabapentin (NEURONTIN) 300 MG capsule Take 1 capsule (300 mg total) by mouth 3 (three) times daily as needed. 90 capsule 2   hydrOXYzine (ATARAX) 25 MG tablet Take 25 mg by mouth every 8 (eight) hours as needed.     JANUVIA 100 MG tablet Take 1 tablet (100 mg total) by mouth daily. Annual appt due in August must see provider for future refills 90 tablet 0   ketorolac (TORADOL) 10 MG tablet Take 1 tablet (10 mg total) by mouth every 6 (six) hours as needed. 20 tablet 0   Lactobacillus-Inulin (CULTURELLE DIGESTIVE DAILY PO) Take 1 capsule by mouth daily.     lidocaine (HM LIDOCAINE PATCH) 4 %  Place 1 patch onto the skin daily. 30 patch 0   loratadine (CLARITIN) 10 MG tablet Take 10 mg by mouth daily.     metoCLOPramide (REGLAN) 10 MG tablet Take 1 tablet (10 mg total) by mouth every 6 (six) hours as needed for nausea (and reflux/regurgitation). 30 tablet 0   Multiple Vitamin (VITAMIN E/FOLIC ACID/B-6/B-12 PO) Take 1 tablet by mouth daily.     ondansetron (ZOFRAN) 4 MG tablet Take 1 tablet (4 mg total) by mouth every 8 (eight) hours as needed for nausea or vomiting. 30 tablet 1   pantoprazole (PROTONIX) 40 MG tablet TAKE 1 TABLET BY MOUTH EVERY DAY 90 tablet 1   polyethylene glycol powder (GLYCOLAX/MIRALAX) 17 GM/SCOOP powder Take 17 g by mouth daily. 238 g 0   rosuvastatin (CRESTOR) 5 MG tablet Take 1 tablet (5 mg total) by mouth daily. 90 tablet 3   sildenafil (REVATIO) 20 MG tablet TAKE 3 TO 5 TABLETS BY MOUTH AS NEEDED 5 tablet 5   traMADol (ULTRAM) 50 MG tablet Take 1 tablet (50 mg total) by mouth every 6 (six) hours as needed. 30 tablet 0   triamcinolone cream (KENALOG) 0.1 % APPLY 1 APPLICATION TOPICALLY 2 (TWO) TIMES DAILY AS NEEDED (RASH ON  LEG). 30 g 0   vitamin C (ASCORBIC ACID) 500 MG tablet Take 500 mg by mouth daily.     No current facility-administered medications on file prior to visit.    Review of Systems  Constitutional:  Negative for fever.  Eyes:  Negative for visual disturbance.  Respiratory:  Negative for cough, shortness of breath and wheezing.   Cardiovascular:  Positive for chest pain and palpitations (recently had episode - occurred with activity). Negative for leg swelling.  Gastrointestinal:  Negative for abdominal pain, blood in stool, constipation and diarrhea.       No gerd  Genitourinary:  Negative for dysuria.  Musculoskeletal:  Positive for back pain and neck pain.  Skin:  Negative for rash.  Neurological:  Negative for light-headedness and headaches.  Psychiatric/Behavioral:  Negative for dysphoric mood. The patient is not nervous/anxious.         Objective:   Vitals:   08/18/23 1318  BP: 108/70  Pulse: (!) 53  Temp: 97.9 F (36.6 C)  SpO2: 96%   Filed Weights   08/18/23 1318  Weight: 206 lb (93.4 kg)   Body mass index is 27.94 kg/m.  BP Readings from Last 3 Encounters:  08/18/23 108/70  06/23/23 120/70  05/26/23 108/72    Wt Readings from Last 3 Encounters:  08/18/23 206 lb (93.4 kg)  08/12/23 210 lb (95.3 kg)  06/23/23 207 lb (93.9 kg)      Physical Exam Constitutional: He appears well-developed and well-nourished. No distress.  HENT:  Head: Normocephalic and atraumatic.  Right Ear: External ear normal.  Left Ear: External ear normal.  Normal ear canals and TM b/l  Mouth/Throat: Oropharynx is clear and moist. Eyes: Conjunctivae and EOM are normal.  Neck: Neck supple. No tracheal deviation present. No thyromegaly present.  No carotid bruit  Cardiovascular: Normal rate, regular rhythm, normal heart sounds and intact distal pulses.   No murmur heard.  No lower extremity edema. Pulmonary/Chest: Effort normal and breath sounds normal. No respiratory distress. He has no wheezes. He has no rales.  Abdominal: Soft. He exhibits no distension. There is no tenderness.  Genitourinary: deferred  Lymphadenopathy:   He has no cervical adenopathy.  Skin: Skin is warm and dry. He is not diaphoretic.  Psychiatric: He has a normal mood and affect. His behavior is normal.    Diabetic Foot Exam - Simple   Simple Foot Form Diabetic Foot exam was performed with the following findings: Yes 08/18/2023  2:02 PM  Visual Inspection No deformities, no ulcerations, no other skin breakdown bilaterally: Yes Sensation Testing Intact to touch and monofilament testing bilaterally: Yes Pulse Check Posterior Tibialis and Dorsalis pulse intact bilaterally: Yes Comments         Assessment & Plan:   Physical exam: Screening blood work  ordered Exercise   not regularly - stressed regular exercise Weight  is  ok Substance abuse   none   Reviewed recommended immunizations.  Flu immunization administered today.     Health Maintenance  Topic Date Due   COVID-19 Vaccine (1) Never done   OPHTHALMOLOGY EXAM  11/28/2022   FOOT EXAM  08/05/2023   HEMOGLOBIN A1C  10/24/2023   Diabetic kidney evaluation - eGFR measurement  04/22/2024   Diabetic kidney evaluation - Urine ACR  04/22/2024   Medicare Annual Wellness (AWV)  08/11/2024   Colonoscopy  10/28/2028   DTaP/Tdap/Td (3 - Td or Tdap) 08/04/2032   INFLUENZA VACCINE  Completed   Hepatitis C Screening  Completed  HIV Screening  Completed   HPV VACCINES  Aged Out   Zoster Vaccines- Shingrix  Discontinued     See Problem List for Assessment and Plan of chronic medical problems.

## 2023-08-17 NOTE — Patient Instructions (Addendum)
Make eye appointment   Flu immunization administered today.     Blood work was ordered.   The lab is on the first floor.    Medications changes include :   none    Return in about 6 months (around 02/15/2024) for follow up.   Health Maintenance, Male Adopting a healthy lifestyle and getting preventive care are important in promoting health and wellness. Ask your health care provider about: The right schedule for you to have regular tests and exams. Things you can do on your own to prevent diseases and keep yourself healthy. What should I know about diet, weight, and exercise? Eat a healthy diet  Eat a diet that includes plenty of vegetables, fruits, low-fat dairy products, and lean protein. Do not eat a lot of foods that are high in solid fats, added sugars, or sodium. Maintain a healthy weight Body mass index (BMI) is a measurement that can be used to identify possible weight problems. It estimates body fat based on height and weight. Your health care provider can help determine your BMI and help you achieve or maintain a healthy weight. Get regular exercise Get regular exercise. This is one of the most important things you can do for your health. Most adults should: Exercise for at least 150 minutes each week. The exercise should increase your heart rate and make you sweat (moderate-intensity exercise). Do strengthening exercises at least twice a week. This is in addition to the moderate-intensity exercise. Spend less time sitting. Even light physical activity can be beneficial. Watch cholesterol and blood lipids Have your blood tested for lipids and cholesterol at 55 years of age, then have this test every 5 years. You may need to have your cholesterol levels checked more often if: Your lipid or cholesterol levels are high. You are older than 55 years of age. You are at high risk for heart disease. What should I know about cancer screening? Many types of cancers can be  detected early and may often be prevented. Depending on your health history and family history, you may need to have cancer screening at various ages. This may include screening for: Colorectal cancer. Prostate cancer. Skin cancer. Lung cancer. What should I know about heart disease, diabetes, and high blood pressure? Blood pressure and heart disease High blood pressure causes heart disease and increases the risk of stroke. This is more likely to develop in people who have high blood pressure readings or are overweight. Talk with your health care provider about your target blood pressure readings. Have your blood pressure checked: Every 3-5 years if you are 75-32 years of age. Every year if you are 34 years old or older. If you are between the ages of 65 and 53 and are a current or former smoker, ask your health care provider if you should have a one-time screening for abdominal aortic aneurysm (AAA). Diabetes Have regular diabetes screenings. This checks your fasting blood sugar level. Have the screening done: Once every three years after age 34 if you are at a normal weight and have a low risk for diabetes. More often and at a younger age if you are overweight or have a high risk for diabetes. What should I know about preventing infection? Hepatitis B If you have a higher risk for hepatitis B, you should be screened for this virus. Talk with your health care provider to find out if you are at risk for hepatitis B infection. Hepatitis C Blood testing is recommended for: Everyone  born from [birth year] through 1963. Anyone with known risk factors for hepatitis C. Sexually transmitted infections (STIs) You should be screened each year for STIs, including gonorrhea and chlamydia, if: You are sexually active and are younger than 55 years of age. You are older than 55 years of age and your health care provider tells you that you are at risk for this type of infection. Your sexual activity has changed  since you were last screened, and you are at increased risk for chlamydia or gonorrhea. Ask your health care provider if you are at risk. Ask your health care provider about whether you are at high risk for HIV. Your health care provider may recommend a prescription medicine to help prevent HIV infection. If you choose to take medicine to prevent HIV, you should first get tested for HIV. You should then be tested every 3 months for as long as you are taking the medicine. Follow these instructions at home: Alcohol use Do not drink alcohol if your health care provider tells you not to drink. If you drink alcohol: Limit how much you have to 0-2 drinks a day. Know how much alcohol is in your drink. In the [place] one drink equals one 12 oz bottle of beer (355 mL), one 5 oz glass of wine (148 mL), or one 1 oz glass of hard liquor (44 mL). Lifestyle Do not use any products that contain nicotine or tobacco. These products include cigarettes, chewing tobacco, and vaping devices, such as e-cigarettes. If you need help quitting, ask your health care provider. Do not use street drugs. Do not share needles. Ask your health care provider for help if you need support or information about quitting drugs. General instructions Schedule regular health, dental, and eye exams. Stay current with your vaccines. Tell your health care provider if: You often feel depressed. You have ever been abused or do not feel safe at home. Summary Adopting a healthy lifestyle and getting preventive care are important in promoting health and wellness. Follow your health care provider's instructions about healthy diet, exercising, and getting tested or screened for diseases. Follow your health care provider's instructions on monitoring your cholesterol and blood pressure. This information is not intended to replace advice given to you by your health care provider. Make sure you discuss any questions you have with your health care  provider. Document Revised: 04/15/2021 Document Reviewed: 04/15/2021 Elsevier Patient Education  2024 [employer].

## 2023-08-18 ENCOUNTER — Ambulatory Visit (INDEPENDENT_AMBULATORY_CARE_PROVIDER_SITE_OTHER): Payer: Medicare Other | Admitting: Internal Medicine

## 2023-08-18 ENCOUNTER — Encounter: Payer: Self-pay | Admitting: Internal Medicine

## 2023-08-18 VITALS — BP 108/70 | HR 53 | Temp 97.9°F | Ht 72.0 in | Wt 206.0 lb

## 2023-08-18 DIAGNOSIS — M545 Low back pain, unspecified: Secondary | ICD-10-CM

## 2023-08-18 DIAGNOSIS — Z125 Encounter for screening for malignant neoplasm of prostate: Secondary | ICD-10-CM

## 2023-08-18 DIAGNOSIS — Z0000 Encounter for general adult medical examination without abnormal findings: Secondary | ICD-10-CM | POA: Diagnosis not present

## 2023-08-18 DIAGNOSIS — Z7984 Long term (current) use of oral hypoglycemic drugs: Secondary | ICD-10-CM

## 2023-08-18 DIAGNOSIS — Z23 Encounter for immunization: Secondary | ICD-10-CM | POA: Diagnosis not present

## 2023-08-18 DIAGNOSIS — K219 Gastro-esophageal reflux disease without esophagitis: Secondary | ICD-10-CM | POA: Diagnosis not present

## 2023-08-18 DIAGNOSIS — E782 Mixed hyperlipidemia: Secondary | ICD-10-CM | POA: Diagnosis not present

## 2023-08-18 DIAGNOSIS — E1169 Type 2 diabetes mellitus with other specified complication: Secondary | ICD-10-CM

## 2023-08-18 DIAGNOSIS — G8929 Other chronic pain: Secondary | ICD-10-CM

## 2023-08-18 LAB — CBC WITH DIFFERENTIAL/PLATELET
Basophils Absolute: 0 10*3/uL (ref 0.0–0.1)
Basophils Relative: 0.3 % (ref 0.0–3.0)
Eosinophils Absolute: 0.1 10*3/uL (ref 0.0–0.7)
Eosinophils Relative: 1.6 % (ref 0.0–5.0)
HCT: 44.9 % (ref 39.0–52.0)
Hemoglobin: 14.9 g/dL (ref 13.0–17.0)
Lymphocytes Relative: 56.3 % — ABNORMAL HIGH (ref 12.0–46.0)
Lymphs Abs: 2.7 10*3/uL (ref 0.7–4.0)
MCHC: 33.2 g/dL (ref 30.0–36.0)
MCV: 83.7 fl (ref 78.0–100.0)
Monocytes Absolute: 0.4 10*3/uL (ref 0.1–1.0)
Monocytes Relative: 7.5 % (ref 3.0–12.0)
Neutro Abs: 1.6 10*3/uL (ref 1.4–7.7)
Neutrophils Relative %: 34.3 % — ABNORMAL LOW (ref 43.0–77.0)
Platelets: 208 10*3/uL (ref 150.0–400.0)
RBC: 5.37 Mil/uL (ref 4.22–5.81)
RDW: 13.8 % (ref 11.5–15.5)
WBC: 4.8 10*3/uL (ref 4.0–10.5)

## 2023-08-18 LAB — PSA, MEDICARE: PSA: 0.3 ng/mL (ref 0.10–4.00)

## 2023-08-18 LAB — HEMOGLOBIN A1C: Hgb A1c MFr Bld: 6.8 % — ABNORMAL HIGH (ref 4.6–6.5)

## 2023-08-18 NOTE — Assessment & Plan Note (Signed)
Chronic Has chronic neck pain and back pain Currently not doing anything during the day-does a little bit around the house, but may lay in bed for a while Stressed that he needs to be doing more during the day ideally exercising on a regular basis and doing things around the house.

## 2023-08-18 NOTE — Assessment & Plan Note (Signed)
Chronic Check lipid panel, CMP Continue Crestor 5 mg daily Stressed regular exercise, healthy diet

## 2023-08-18 NOTE — Assessment & Plan Note (Addendum)
Chronic GERD controlled Continue pantoprazole 40 mg daily, famotidine 20 mg daily

## 2023-08-18 NOTE — Assessment & Plan Note (Addendum)
Chronic With hyperlipidemia  Lab Results  Component Value Date   HGBA1C 6.6 (H) 04/23/2023   Sugars controlled Check A1c Continue Januvia 100 mg daily-has been effective and well-tolerated Stressed regular exercise, diabetic diet Eye exam due-will schedule

## 2023-08-19 LAB — COMPREHENSIVE METABOLIC PANEL
ALT: 27 U/L (ref 0–53)
AST: 21 U/L (ref 0–37)
Albumin: 4.4 g/dL (ref 3.5–5.2)
Alkaline Phosphatase: 64 U/L (ref 39–117)
BUN: 11 mg/dL (ref 6–23)
CO2: 28 meq/L (ref 19–32)
Calcium: 9.9 mg/dL (ref 8.4–10.5)
Chloride: 101 meq/L (ref 96–112)
Creatinine, Ser: 1.07 mg/dL (ref 0.40–1.50)
GFR: 78.02 mL/min (ref >60.00)
Glucose, Bld: 103 mg/dL — ABNORMAL HIGH (ref 70–99)
Potassium: 4.1 meq/L (ref 3.5–5.1)
Sodium: 137 meq/L (ref 135–145)
Total Bilirubin: 1.3 mg/dL — ABNORMAL HIGH (ref 0.2–1.2)
Total Protein: 7.6 g/dL (ref 6.0–8.3)

## 2023-08-19 LAB — LIPID PANEL
Cholesterol: 84 mg/dL (ref 0–200)
HDL: 38 mg/dL — ABNORMAL LOW (ref >39.00)
LDL Cholesterol: 28 mg/dL (ref 0–99)
NonHDL: 45.88
Total CHOL/HDL Ratio: 2
Triglycerides: 89 mg/dL (ref 0.0–149.0)
VLDL: 17.8 mg/dL (ref 0.0–40.0)

## 2023-09-03 DIAGNOSIS — E119 Type 2 diabetes mellitus without complications: Secondary | ICD-10-CM | POA: Diagnosis not present

## 2023-09-16 ENCOUNTER — Ambulatory Visit: Payer: Medicare Other | Attending: Cardiology | Admitting: Cardiology

## 2023-09-16 ENCOUNTER — Encounter: Payer: Self-pay | Admitting: Cardiology

## 2023-09-16 VITALS — BP 110/78 | HR 88 | Ht 73.0 in | Wt 205.2 lb

## 2023-09-16 DIAGNOSIS — I2510 Atherosclerotic heart disease of native coronary artery without angina pectoris: Secondary | ICD-10-CM | POA: Diagnosis not present

## 2023-09-16 DIAGNOSIS — R21 Rash and other nonspecific skin eruption: Secondary | ICD-10-CM

## 2023-09-16 DIAGNOSIS — E782 Mixed hyperlipidemia: Secondary | ICD-10-CM

## 2023-09-16 MED ORDER — ROSUVASTATIN CALCIUM 5 MG PO TABS
5.0000 mg | ORAL_TABLET | Freq: Every evening | ORAL | 3 refills | Status: DC
Start: 2023-09-16 — End: 2024-11-01

## 2023-09-16 NOTE — Patient Instructions (Addendum)
Medication Instructions:  Your physician recommends that you continue on your current medications as directed. Please refer to the Current Medication list given to you today.  *If you need a refill on your cardiac medications before your next appointment, please call your pharmacy*  Lab Work: None  Testing/Procedures: None   Follow-Up: At [facility], you and your health needs are our priority.  As part of our continuing mission to provide you with exceptional heart care, we have created designated Provider Care Teams.  These Care Teams include your primary Cardiologist (physician) and Advanced Practice Providers (APPs -  Physician Assistants and Nurse Practitioners) who all work together to provide you with the care you need, when you need it.  Your next appointment:   12 month(s)  Provider:   Thomasene Ripple, DO    Other Instructions We placed a referral for an Allergy Specialist, please follow up with that office.  Consider using a "sensitive skin or unscented" soap/body wash.  Consider using a hypoallergenic laundry detergent.

## 2023-09-16 NOTE — Progress Notes (Unsigned)
Cardiology Office Note:    Date:  09/16/2023   ID:  [name], DOB [birth year], MRN [id number]  PCP:  [name], MD  Cardiologist:  Thomasene Ripple, DO  Electrophysiologist:  None   Referring MD: [name], MD   " I am doing fine    History of Present Illness:    [name] is a 57 y.o. male with a hx of minimal coronary artery disease seen on coronary CTA, OSA, diabetes mellitus, GERD here today for follow-up visit.    Past Medical History:  Diagnosis Date   Allergy    Chest wall pain    Cough    Diabetes (HCC)    Diarrhea recurrent   GERD (gastroesophageal reflux disease)    Headache(784.0)    Irritable bowel syndrome    Obstructive sleep apnea    apnealink 10/23/10 AHI 5, Sp02 low 87%   Postural lightheadedness    SBO (small bowel obstruction) (HCC) 12/2015   Sleep apnea    11/27/21 will get new test    Past Surgical History:  Procedure Laterality Date   CHOLECYSTECTOMY  2009   COLECTOMY  1994   segmental   COLONOSCOPY  07/17/2011   hemorrhoids, otherwise normal including terminal ileum and random colon biopises   GASTROSTOMY  2005   stamm   LAPAROTOMY  2002, 2005, 2009   SPHINCTEROTOMY  2009   lateral internal   UPPER GASTROINTESTINAL ENDOSCOPY  07/17/2011   normal, including duodenal biopsies    Current Medications: Current Meds  Medication Sig   albuterol (VENTOLIN HFA) 108 (90 Base) MCG/ACT inhaler Inhale 2 puffs into the lungs every 6 (six) hours as needed.   famotidine (PEPCID) 20 MG tablet TAKE 1 TABLET BY MOUTH EVERYDAY AT BEDTIME   hydrOXYzine (ATARAX) 25 MG tablet Take 25 mg by mouth every 8 (eight) hours as needed.   JANUVIA 100 MG tablet Take 1 tablet (100 mg total) by mouth daily. Annual appt due in August must see provider for future refills   lidocaine (HM LIDOCAINE PATCH) 4 % Place 1 patch onto the skin daily.   loratadine (CLARITIN) 10 MG tablet Take 10 mg by mouth daily.   pantoprazole (PROTONIX) 40 MG tablet TAKE 1 TABLET  BY MOUTH EVERY DAY   polyethylene glycol powder (GLYCOLAX/MIRALAX) 17 GM/SCOOP powder Take 17 g by mouth daily.   rosuvastatin (CRESTOR) 5 MG tablet Take 1 tablet (5 mg total) by mouth daily.   sildenafil (REVATIO) 20 MG tablet TAKE 3 TO 5 TABLETS BY MOUTH AS NEEDED   triamcinolone cream (KENALOG) 0.1 % APPLY 1 APPLICATION TOPICALLY 2 (TWO) TIMES DAILY AS NEEDED (RASH ON LEG).     Allergies:   Metformin and related   Social History   Socioeconomic History   Marital status: Married    Spouse name: Not on file   Number of children: 4   Years of education: Not on file   Highest education level: Associate degree: academic program  Occupational History   Occupation: FORK LIFT DRIVER    Employer: [employer]    Comment: 11/27/21 out of work  Tobacco Use   Smoking status: Never   Smokeless tobacco: Never  Vaping Use   Vaping status: Never Used  Substance and Sexual Activity   Alcohol use: Never   Drug use: Never   Sexual activity: Not on file  Other Topics Concern   Not on file  Social History Narrative   Occupation: Group Lead Scientist, research (physical sciences) (deliveries)-disabled  in 2022   Patient has never smoked.   Alcohol Use-no   Illicit Drug Use-no   Married-Divorced-Married again 2009-    4 children 2 sons and 2 daughters   [place] heritage   No regular exercise   Social Determinants of Health   Financial Resource Strain: Low Risk  (08/17/2023)   Overall Financial Resource Strain (CARDIA)    Difficulty of Paying Living Expenses: Not very hard  Food Insecurity: No Food Insecurity (08/17/2023)   Hunger Vital Sign    Worried About Running Out of Food in the Last Year: Never true    Ran Out of Food in the Last Year: Never true  Transportation Needs: No Transportation Needs (08/17/2023)   PRAPARE - Administrator, Civil Service (Medical): No    Lack of Transportation (Non-Medical): No  Physical Activity: Inactive (08/17/2023)   Exercise Vital Sign    Days of  Exercise per Week: 0 days    Minutes of Exercise per Session: 0 min  Stress: No Stress Concern Present (08/17/2023)   [employer] of Occupational Health - Occupational Stress Questionnaire    Feeling of Stress : Not at all  Social Connections: Socially Isolated (08/17/2023)   Social Connection and Isolation Panel [NHANES]    Frequency of Communication with Friends and Family: Once a week    Frequency of Social Gatherings with Friends and Family: Never    Attends Religious Services: Never    Database administrator or Organizations: No    Attends Engineer, structural: Never    Marital Status: Married     Family History: The patient's family history includes Hypertension in his mother; Liver disease in his father. There is no history of Colon cancer, Esophageal cancer, Rectal cancer, or Stomach cancer.  ROS:   Review of Systems  Constitution: Negative for decreased appetite, fever and weight gain.  HENT: Negative for congestion, ear discharge, hoarse voice and sore throat.   Eyes: Negative for discharge, redness, vision loss in right eye and visual halos.  Cardiovascular: Negative for chest pain, dyspnea on exertion, leg swelling, orthopnea and palpitations.  Respiratory: Negative for cough, hemoptysis, shortness of breath and snoring.   Endocrine: Negative for heat intolerance and polyphagia.  Hematologic/Lymphatic: Negative for bleeding problem. Does not bruise/bleed easily.  Skin: Negative for flushing, nail changes, rash and suspicious lesions.  Musculoskeletal: Negative for arthritis, joint pain, muscle cramps, myalgias, neck pain and stiffness.  Gastrointestinal: Negative for abdominal pain, bowel incontinence, diarrhea and excessive appetite.  Genitourinary: Negative for decreased libido, genital sores and incomplete emptying.  Neurological: Negative for brief paralysis, focal weakness, headaches and loss of balance.  Psychiatric/Behavioral: Negative for altered mental  status, depression and suicidal ideas.  Allergic/Immunologic: Negative for HIV exposure and persistent infections.    EKGs/Labs/Other Studies Reviewed:    The following studies were reviewed today:   EKG: None today  The following studies were reviewed today: CT cardiac 04/18/21 IMPRESSION: 1. Coronary calcium score of 6.48. This was 73rd percentile for age-, race-, and sex-matched controls.   2. Normal coronary origin with right dominance.   3. Minimal calcified plaque in the mid RCA.  CAD-RADS 1.   Echo 2012: - Left ventricle: The cavity size was normal. Wall thickness was    increased in a pattern of mild LVH. Systolic function was normal.    The estimated ejection fraction was in the range of 55% to 60%.    Indeterminant diastolic function. Wall motion was normal; there  were no regional wall motion abnormalities.  - Aortic valve: There was no stenosis. Trivial regurgitation.  - Aorta: Mild aortic root dilation. Aortic root dimension: 38mm    (ED).  - Mitral valve: Trivial regurgitation.  - Left atrium: The atrium was at the upper limits of normal in size.  - Right ventricle: The cavity size was normal. Systolic function was    normal.  - Pulmonary arteries: PA peak pressure: 23mm Hg (S).  - Inferior vena cava: The vessel was normal in size; the    respirophasic diameter changes were in the normal range (= 50%);    findings are consistent with normal central venous pressure.  Impressions:   - Normal LV size with mild LV hypertrophy. EF 55-60%. Indeterminant    diastolic function. Normal RV size and systolic function. No    significant valvular dysfunction.    ETT 03/11/2011 9.5 METs, 7:37, no symptoms, achieved 98% MPHR. Baseline RBBB, no ST changes, no evidence of ischemia.  Recent Labs: 08/18/2023: ALT 27; BUN 11; Creatinine, Ser 1.07; Hemoglobin 14.9; Platelets 208.0; Potassium 4.1; Sodium 137  Recent Lipid Panel    Component Value Date/Time   CHOL 84  08/18/2023 1415   CHOL 87 (L) 09/23/2021 1510   TRIG 89.0 08/18/2023 1415   HDL 38.00 (L) 08/18/2023 1415   HDL 33 (L) 09/23/2021 1510   CHOLHDL 2 08/18/2023 1415   VLDL 17.8 08/18/2023 1415   LDLCALC 28 08/18/2023 1415   LDLCALC 36 05/02/2022 1634   LDLDIRECT 64.0 09/30/2016 0901    Physical Exam:    VS:  BP 110/78 (BP Location: Left Arm, Patient Position: Sitting, Cuff Size: Normal)   Pulse 88   Ht 6\' 1"  (1.854 m)   Wt 205 lb 3.2 oz (93.1 kg)   SpO2 94%   BMI 27.07 kg/m     Wt Readings from Last 3 Encounters:  09/16/23 205 lb 3.2 oz (93.1 kg)  08/18/23 206 lb (93.4 kg)  08/12/23 210 lb (95.3 kg)     GEN: Well nourished, well developed in no acute distress HEENT: Normal NECK: No JVD; No carotid bruits LYMPHATICS: No lymphadenopathy CARDIAC: S1S2 noted,RRR, no murmurs, rubs, gallops RESPIRATORY:  Clear to auscultation without rales, wheezing or rhonchi  ABDOMEN: Soft, non-tender, non-distended, +bowel sounds, no guarding. EXTREMITIES: No edema, No cyanosis, no clubbing MUSCULOSKELETAL:  No deformity  SKIN: Warm and dry NEUROLOGIC:  Alert and oriented x 3, non-focal PSYCHIATRIC:  Normal affect, good insight  ASSESSMENT:    No diagnosis found.  PLAN:     1.  Coronary artery disease-this was minimal no anginal symptoms. 2.  Hyperlipidemia - continue with current statin medication.  We will get lipid profile as well today.  The patient is in agreement with the above plan. The patient left the office in stable condition.  The patient will follow up in 1 year or sooner if needed.   Medication Adjustments/Labs and Tests Ordered: Current medicines are reviewed at length with the patient today.  Concerns regarding medicines are outlined above.  No orders of the defined types were placed in this encounter.  No orders of the defined types were placed in this encounter.   There are no Patient Instructions on file for this visit.   Adopting a Healthy  Lifestyle.  Know what a healthy weight is for you (roughly BMI <25) and aim to maintain this   Aim for 7+ servings of fruits and vegetables daily   65-80+ fluid ounces of water or unsweet  tea for healthy kidneys   Limit to max 1 drink of alcohol per day; avoid smoking/tobacco   Limit animal fats in diet for cholesterol and heart health - choose grass fed whenever available   Avoid highly processed foods, and foods high in saturated/trans fats   Aim for low stress - take time to unwind and care for your mental health   Aim for 150 min of moderate intensity exercise weekly for heart health, and weights twice weekly for bone health   Aim for 7-9 hours of sleep daily   When it comes to diets, agreement about the perfect plan isnt easy to find, even among the experts. Experts at the [facility] of [employer] developed an idea known as the Healthy Eating Plate. Just imagine a plate divided into logical, healthy portions.   The emphasis is on diet quality:   Load up on vegetables and fruits - one-half of your plate: Aim for color and variety, and remember that potatoes dont count.   Go for whole grains - one-quarter of your plate: Whole wheat, barley, wheat berries, quinoa, oats, brown rice, and foods made with them. If you want pasta, go with whole wheat pasta.   Protein power - one-quarter of your plate: Fish, chicken, beans, and nuts are all healthy, versatile protein sources. Limit red meat.   The diet, however, does go beyond the plate, offering a few other suggestions.   Use healthy plant oils, such as olive, canola, soy, corn, sunflower and peanut. Check the labels, and avoid partially hydrogenated oil, which have unhealthy trans fats.   If youre thirsty, drink water. Coffee and tea are good in moderation, but skip sugary drinks and limit milk and dairy products to one or two daily servings.   The type of carbohydrate in the diet is more important than the amount. Some  sources of carbohydrates, such as vegetables, fruits, whole grains, and beans-are healthier than others.   Finally, stay active  Signed, Thomasene Ripple, DO  09/16/2023 3:42 PM    [facility]

## 2023-09-22 ENCOUNTER — Encounter: Payer: Self-pay | Admitting: Allergy & Immunology

## 2023-09-22 ENCOUNTER — Other Ambulatory Visit: Payer: Self-pay

## 2023-09-22 ENCOUNTER — Ambulatory Visit (INDEPENDENT_AMBULATORY_CARE_PROVIDER_SITE_OTHER): Payer: Medicare Other | Admitting: Allergy & Immunology

## 2023-09-22 VITALS — BP 100/80 | HR 65 | Temp 98.4°F | Resp 20 | Ht 73.23 in | Wt 205.1 lb

## 2023-09-22 DIAGNOSIS — L2089 Other atopic dermatitis: Secondary | ICD-10-CM

## 2023-09-22 DIAGNOSIS — L272 Dermatitis due to ingested food: Secondary | ICD-10-CM | POA: Diagnosis not present

## 2023-09-22 DIAGNOSIS — J310 Chronic rhinitis: Secondary | ICD-10-CM

## 2023-09-22 MED ORDER — TACROLIMUS 0.1 % EX OINT: TOPICAL_OINTMENT | Freq: Two times a day (BID) | CUTANEOUS | 3 refills | Status: DC

## 2023-09-22 NOTE — Patient Instructions (Addendum)
1. Flexural atopic dermatitis - I am going to get some records from [facility] to see what might be going on. - We are doing to start tacrolimus twice daily to see if this helps. - You can use this on the hands as well.  - Continue with Vaseline. - We many consider Dupixent for long term control of this. - Continue with Dove Sensitive Skin soap.  - You have made all of the recommended changes.  - We may consider patch testing in the future, but I want to this itching under good control first.   2. Dermatitis due to food - We will do some testing to the most common foods at the next visit. - This might tell us whether this is related to ingestion of the most common foods.   3. Chronic rhinitis - We will do environmental allergy testing at the next visit.   4. Return in about 1 week (around 09/29/2023). You can have the follow up appointment with Dr. Dellis Anes or a Nurse Practicioner (our Nurse Practitioners are excellent and always have Physician oversight!).    Please inform us of any Emergency Department visits, hospitalizations, or changes in symptoms. Call us before going to the ED for breathing or allergy symptoms since we might be able to fit you in for a sick visit. Feel free to contact us anytime with any questions, problems, or concerns.  It was a pleasure to meet you today!  Websites that have reliable patient information: 1. American Academy of Asthma, Allergy, and Immunology: www.aaaai.org 2. Food Allergy Research and Education (FARE): foodallergy.org 3. Mothers of Asthmatics: http://www.asthmacommunitynetwork.org 4. American College of Allergy, Asthma, and Immunology: www.acaai.org   COVID-19 Vaccine Information can be found at: PodExchange.nl For questions related to vaccine distribution or appointments, please email [email or web address] or call [phone].     "Like" [place] on Facebook and  Instagram for our latest updates!      A healthy democracy works best when [employer] participate! Make sure you are registered to vote! If you have moved or changed any of your contact information, you will need to get this updated before voting! Scan the QR codes below to learn more!

## 2023-09-22 NOTE — Progress Notes (Signed)
NEW PATIENT  Date of Service/Encounter:  09/22/23  Consult requested by: [name], MD   Assessment:   Flexural atopic dermatitis - considering Dupixent  Dermatitis due to food  Chronic rhinitis  Plan/Recommendations:   1. Flexural atopic dermatitis - I am going to get some records from [facility] to see what might be going on. - We are doing to start tacrolimus twice daily to see if this helps. - You can use this on the hands as well.  - Continue with Vaseline. - We many consider Dupixent for long term control of this. - Continue with Dove Sensitive Skin soap.  - You have made all of the recommended changes.  - We may consider patch testing in the future, but I want to this itching under good control first.   2. Dermatitis due to food - We will do some testing to the most common foods at the next visit. - This might tell us whether this is related to ingestion of the most common foods.   3. Chronic rhinitis - We will do environmental allergy testing at the next visit.   4. Return in about 1 week (around 09/29/2023). You can have the follow up appointment with Dr. Dellis Anes or a Nurse Practicioner (our Nurse Practitioners are excellent and always have Physician oversight!).   This note in its entirety was forwarded to the Provider who requested this consultation.  Subjective:   [name] is a 57 y.o. male presenting today for evaluation of  Chief Complaint  Patient presents with   Establish Care   Pruritus    C/o of itchy legs x 1 year    [name] has a history of the following: Patient Active Problem List   Diagnosis Date Noted   Rash 06/25/2023   Cough 06/25/2023   Orchitis of left testicle 06/25/2023   Degeneration of lumbar intervertebral disc 06/23/2023   Lack of sexual desire 06/23/2023   Minimal Nonobstructive CAD 09/30/2022   Rash and nonspecific skin eruption 08/04/2022   Numbness 06/11/2022   Other chronic pain  06/11/2022   Hypercalcemia 05/31/2022   Small bowel obstruction (HCC) 05/31/2022   Hyperlipidemia 09/17/2021   Dry mouth 09/17/2021   B12 deficiency 04/15/2021   Post-COVID chronic dyspnea 01/10/2021   Chronic constipation 10/10/2019   Chronic low back pain 04/15/2019   Chronic insomnia 03/24/2019   Abdominal wall hernia 10/06/2018   Arthralgia 06/01/2017   Diabetes (HCC) 10/01/2016   Meralgia paresthetica of right side 01/25/2016   Vitamin D deficiency 01/25/2016   Male sexual dysfunction 01/25/2016   Allergic rhinitis 02/07/2011   OBSTRUCTIVE SLEEP APNEA, moderate 10/23/2010   GERD 03/12/2010   Irritable bowel syndrome 03/12/2010   DIARRHEA, RECURRENT 02/05/2010    History obtained from: chart review and patient.  Discussed the use of AI scribe software for clinical note transcription with the patient and/or guardian, who gave verbal consent to proceed.  [name] was referred by [name], MD.     [name] is a 57 y.o. male presenting for an evaluation of a rash .  The patient, with a history of diabetes, presents with a severe, pruritic rash that started last summer, improved in the winter, and has worsened this summer. The rash is described as itchy with bumps and dry skin that peels off. The patient has seen a dermatologist twice for this issue, and has tried three to four different ointments and a pill, none of which have provided relief.  The patient has also tried over-the-counter allergy medication, but this has not helped. The patient has not had a biopsy of the rash. The patient also reports dryness of the nose and lips, and has been using Vicks for the dry nose. The patient has not noticed any correlation between food and the rash. The patient denies any history of sinus infections, environmental allergies, or reactions to household cleaning products.   The patient reports that the rash seems to worsen with outdoor exposure, even if it's just for a few minutes.  The patient has been mostly staying indoors due to the severity of the rash. The patient has also been using Vaseline and antibiotic creams, wearing plastic gloves, and recently changed to a sensitive skin soap on the advice of a dermatologist. She saw someone at [facility], although he does not remember who that was. He is willing to sign a release of information form so we can get that information.   He has tried triamcinolone without improvement. He has tried OTC hydrocortisone without improvement. He was also started on hydroxyzine to help with itching, but this did not seem to help.   The patient has a history of back injury and has been disabled for two years. The patient also reports a history of intestinal obstruction requiring surgery. The patient denies any history of reactions to metals or chemicals, and has never smoked. The patient's family history is significant for back problems, but no one else in the family has diabetes or the same rash. he patient's diabetes is reportedly well-controlled.            Otherwise, there is no history of other atopic diseases, including asthma, food allergies, drug allergies, stinging insect allergies, or contact dermatitis. There is no significant infectious history. Vaccinations are up to date.    Past Medical History: Patient Active Problem List   Diagnosis Date Noted   Rash 06/25/2023   Cough 06/25/2023   Orchitis of left testicle 06/25/2023   Degeneration of lumbar intervertebral disc 06/23/2023   Lack of sexual desire 06/23/2023   Minimal Nonobstructive CAD 09/30/2022   Rash and nonspecific skin eruption 08/04/2022   Numbness 06/11/2022   Other chronic pain 06/11/2022   Hypercalcemia 05/31/2022   Small bowel obstruction (HCC) 05/31/2022   Hyperlipidemia 09/17/2021   Dry mouth 09/17/2021   B12 deficiency 04/15/2021   Post-COVID chronic dyspnea 01/10/2021   Chronic constipation 10/10/2019   Chronic low back pain  04/15/2019   Chronic insomnia 03/24/2019   Abdominal wall hernia 10/06/2018   Arthralgia 06/01/2017   Diabetes (HCC) 10/01/2016   Meralgia paresthetica of right side 01/25/2016   Vitamin D deficiency 01/25/2016   Male sexual dysfunction 01/25/2016   Allergic rhinitis 02/07/2011   OBSTRUCTIVE SLEEP APNEA, moderate 10/23/2010   GERD 03/12/2010   Irritable bowel syndrome 03/12/2010   DIARRHEA, RECURRENT 02/05/2010    Medication List:  Allergies as of 09/22/2023       Reactions   Metformin And Related    diarrhea        Medication List        Accurate as of September 22, 2023 11:07 AM. If you have any questions, ask your nurse or doctor.          albuterol 108 (90 Base) MCG/ACT inhaler Commonly known as: VENTOLIN HFA Inhale 2 puffs into the lungs every 6 (six) hours as needed.   ascorbic acid 500 MG tablet Commonly known as: VITAMIN C Take 500 mg by mouth daily.  azelastine 0.1 % nasal spray Commonly known as: ASTELIN Place 1 spray into both nostrils daily. Use in each nostril as directed   CULTURELLE DIGESTIVE DAILY PO Take 1 capsule by mouth daily.   cyclobenzaprine 5 MG tablet Commonly known as: FLEXERIL Take 2 tablets (10 mg total) by mouth 2 (two) times daily as needed for muscle spasms.   famotidine 20 MG tablet Commonly known as: PEPCID TAKE 1 TABLET BY MOUTH EVERYDAY AT BEDTIME   fluticasone 50 MCG/ACT nasal spray Commonly known as: FLONASE Place 1 spray into both nostrils daily.   gabapentin 300 MG capsule Commonly known as: NEURONTIN Take 1 capsule (300 mg total) by mouth 3 (three) times daily as needed.   hydrOXYzine 25 MG tablet Commonly known as: ATARAX Take 25 mg by mouth every 8 (eight) hours as needed.   Januvia 100 MG tablet Generic drug: sitaGLIPtin Take 1 tablet (100 mg total) by mouth daily. Annual appt due in August must see provider for future refills   ketorolac 10 MG tablet Commonly known as: TORADOL Take 1 tablet (10 mg  total) by mouth every 6 (six) hours as needed.   lidocaine 4 % Commonly known as: HM Lidocaine Patch Place 1 patch onto the skin daily.   loratadine 10 MG tablet Commonly known as: CLARITIN Take 10 mg by mouth daily.   metoCLOPramide 10 MG tablet Commonly known as: Reglan Take 1 tablet (10 mg total) by mouth every 6 (six) hours as needed for nausea (and reflux/regurgitation).   ondansetron 4 MG tablet Commonly known as: ZOFRAN Take 1 tablet (4 mg total) by mouth every 8 (eight) hours as needed for nausea or vomiting.   pantoprazole 40 MG tablet Commonly known as: PROTONIX TAKE 1 TABLET BY MOUTH EVERY DAY   polyethylene glycol powder 17 GM/SCOOP powder Commonly known as: GLYCOLAX/MIRALAX Take 17 g by mouth daily.   rosuvastatin 5 MG tablet Commonly known as: CRESTOR Take 1 tablet (5 mg total) by mouth at bedtime.   sildenafil 20 MG tablet Commonly known as: REVATIO TAKE 3 TO 5 TABLETS BY MOUTH AS NEEDED   tacrolimus 0.1 % ointment Commonly known as: PROTOPIC Apply topically 2 (two) times daily. Started by: Alfonse Spruce   traMADol 50 MG tablet Commonly known as: ULTRAM Take 1 tablet (50 mg total) by mouth every 6 (six) hours as needed.   triamcinolone cream 0.1 % Commonly known as: KENALOG APPLY 1 APPLICATION TOPICALLY 2 (TWO) TIMES DAILY AS NEEDED (RASH ON LEG).   Vitamin D 50 MCG (2000 UT) Caps Take 1 capsule by mouth daily.   VITAMIN E/FOLIC ACID/B-6/B-12 PO Take 1 tablet by mouth daily.        Birth History: non-contributory  Developmental History: non-contributory  Past Surgical History: Past Surgical History:  Procedure Laterality Date   CHOLECYSTECTOMY  2009   COLECTOMY  1994   segmental   COLONOSCOPY  07/17/2011   hemorrhoids, otherwise normal including terminal ileum and random colon biopises   GASTROSTOMY  2005   stamm   LAPAROTOMY  2002, 2005, 2009   SPHINCTEROTOMY  2009   lateral internal   UPPER GASTROINTESTINAL ENDOSCOPY   07/17/2011   normal, including duodenal biopsies     Family History: Family History  Problem Relation Age of Onset   Hypertension Mother    Liver disease Father        liver cancer???   Colon cancer Neg Hx    Esophageal cancer Neg Hx    Rectal cancer Neg  Hx    Stomach cancer Neg Hx      Social History: Patric lives at home with his family including his wife and four children.  He lives in a house that is 19 years old.  There is carpeting in the bedroom.  They have gas heating and central cooling.  There are no animals inside or outside of the home.  There are no dust mite covers on the bedding.  He is currently on disability.  There is no fume, chemical, or dust exposure.  There is a HEPA filter in the home.  Review of systems otherwise negative other than that mentioned in the HPI.    Objective:   Blood pressure 100/80, pulse 65, temperature 98.4 F (36.9 C), resp. rate 20, height 6' 1.23" (1.86 m), weight 205 lb 1.6 oz (93 kg), SpO2 98%. Body mass index is 26.89 kg/m.     Physical Exam Vitals reviewed.  Constitutional:      Appearance: He is well-developed.     Comments: Talkative.   HENT:     Head: Normocephalic and atraumatic.     Right Ear: Tympanic membrane, ear canal and external ear normal. No drainage, swelling or tenderness. Tympanic membrane is not injected, scarred, erythematous, retracted or bulging.     Left Ear: Tympanic membrane, ear canal and external ear normal. No drainage, swelling or tenderness. Tympanic membrane is not injected, scarred, erythematous, retracted or bulging.     Nose: Mucosal edema and rhinorrhea present. No nasal deformity or septal deviation.     Right Turbinates: Enlarged and swollen.     Left Turbinates: Swollen.     Right Sinus: No maxillary sinus tenderness or frontal sinus tenderness.     Left Sinus: No maxillary sinus tenderness or frontal sinus tenderness.     Mouth/Throat:     Mouth: Mucous membranes are not pale and not  dry.     Pharynx: Uvula midline.  Eyes:     General:        Right eye: No discharge.        Left eye: No discharge.     Conjunctiva/sclera: Conjunctivae normal.     Right eye: Right conjunctiva is not injected. No chemosis.    Left eye: Left conjunctiva is not injected. No chemosis.    Pupils: Pupils are equal, round, and reactive to light.  Cardiovascular:     Rate and Rhythm: Normal rate and regular rhythm.     Heart sounds: Normal heart sounds.  Pulmonary:     Effort: Pulmonary effort is normal. No tachypnea, accessory muscle usage or respiratory distress.     Breath sounds: Normal breath sounds. No wheezing, rhonchi or rales.  Chest:     Chest wall: No tenderness.  Abdominal:     Tenderness: There is no abdominal tenderness. There is no guarding or rebound.  Lymphadenopathy:     Head:     Right side of head: No submandibular, tonsillar or occipital adenopathy.     Left side of head: No submandibular, tonsillar or occipital adenopathy.     Cervical: No cervical adenopathy.  Skin:    General: Skin is warm.     Capillary Refill: Capillary refill takes less than 2 seconds.     Coloration: Skin is not pale.     Findings: Rash present. No abrasion, erythema or petechiae. Rash is papular and scaling. Rash is not urticarial or vesicular.     Comments: Scaling rash with excoriations on the bilateral legs.  He  does not have any oozing.  There is some minimal honey crusting. Pictures shown in the HPI.   Neurological:     Mental Status: He is alert.  Psychiatric:        Behavior: Behavior is cooperative.      Diagnostic studies: deferred due to insurance stipulations that refuse to pay for testing at initial visits, making it more difficult for patients to get the care they need        I Malachi Bonds, MD [facility]

## 2023-09-24 ENCOUNTER — Telehealth: Payer: Self-pay

## 2023-09-24 ENCOUNTER — Other Ambulatory Visit (HOSPITAL_COMMUNITY): Payer: Self-pay

## 2023-09-24 NOTE — Telephone Encounter (Signed)
Pharmacy Patient Advocate Encounter   Received notification from CoverMyMeds that prior authorization for Tacrolimus 0.1% ointment is required/requested.   Insurance verification completed.   The patient is insured through [facility] .   Per test claim: PA required; PA started via CoverMyMeds. KEY [id number] . Waiting for clinical questions to populate.

## 2023-09-25 NOTE — Telephone Encounter (Signed)
Pharmacy Patient Advocate Encounter  Questions generated, answered, and submitted

## 2023-09-28 ENCOUNTER — Other Ambulatory Visit (HOSPITAL_COMMUNITY): Payer: Self-pay

## 2023-09-28 MED ORDER — PIMECROLIMUS 1 % EX CREA: TOPICAL_CREAM | Freq: Two times a day (BID) | CUTANEOUS | 0 refills | Status: AC

## 2023-09-28 NOTE — Telephone Encounter (Signed)
Pharmacy Patient Advocate Encounter  Received notification from [facility]  that Prior Authorization for Tacrolimus 0.1% ointment has been APPROVED from 09-25-2023 to 09-24-2024. Ran test claim, Copay is $69.02. Quantity approved 100 grams per 30 days. This test claim was processed through [facility]- copay amounts may vary at other pharmacies due to pharmacy/plan contracts, or as the patient moves through the different stages of their insurance plan.   PA #/Case ID/Reference #: [id number]

## 2023-09-28 NOTE — Telephone Encounter (Signed)
Called the pharmacy to let them know that the prior authorization for tacrolimus was approved. Pharmacist stated that medication did go through but it will be $219.50 to fill. This is different from what the PA team provided as a price. Inquired if the medication would be less expensive if the amount was changed and the pharmacist said yes, but she did not now by how much it would be cheaper.

## 2023-09-28 NOTE — Telephone Encounter (Signed)
Good lord - I sent in Elidel instead. Is that cheaper?

## 2023-09-28 NOTE — Addendum Note (Signed)
Addended by: Alfonse Spruce on: 09/28/2023 03:14 PM   Modules accepted: Orders

## 2023-09-29 ENCOUNTER — Telehealth: Payer: Self-pay

## 2023-09-29 ENCOUNTER — Other Ambulatory Visit (HOSPITAL_COMMUNITY): Payer: Self-pay

## 2023-09-29 NOTE — Telephone Encounter (Signed)
*  Asthma/Allergy  Pharmacy Patient Advocate Encounter   Received notification from CoverMyMeds that prior authorization for Pimecrolimus 1% cream  is required/requested.   Insurance verification completed.   The patient is insured through [facility] .   Per test claim: PA required; PA started via CoverMyMeds. KEY [id number] . Waiting for clinical questions to populate.

## 2023-09-29 NOTE — Telephone Encounter (Signed)
Questions populated and submitted

## 2023-09-30 ENCOUNTER — Other Ambulatory Visit (HOSPITAL_COMMUNITY): Payer: Self-pay

## 2023-09-30 NOTE — Telephone Encounter (Signed)
Pharmacy Patient Advocate Encounter  Received notification from [facility] that Prior Authorization for Pimecrolimus 1% has been APPROVED from 09/29/2023 to 09/28/2024. Ran test claim, Copay is $99.00. This test claim was processed through [facility]- copay amounts may vary at other pharmacies due to pharmacy/plan contracts, or as the patient moves through the different stages of their insurance plan.

## 2023-10-01 ENCOUNTER — Ambulatory Visit: Payer: Medicare Other | Admitting: Allergy & Immunology

## 2023-10-01 ENCOUNTER — Encounter: Payer: Self-pay | Admitting: Allergy & Immunology

## 2023-10-01 DIAGNOSIS — L272 Dermatitis due to ingested food: Secondary | ICD-10-CM | POA: Diagnosis not present

## 2023-10-01 DIAGNOSIS — L281 Prurigo nodularis: Secondary | ICD-10-CM

## 2023-10-01 DIAGNOSIS — L299 Pruritus, unspecified: Secondary | ICD-10-CM

## 2023-10-01 DIAGNOSIS — J3089 Other allergic rhinitis: Secondary | ICD-10-CM | POA: Diagnosis not present

## 2023-10-01 DIAGNOSIS — L2089 Other atopic dermatitis: Secondary | ICD-10-CM | POA: Diagnosis not present

## 2023-10-01 MED ORDER — OPZELURA 1.5 % EX CREA: 1.0000 | TOPICAL_CREAM | Freq: Two times a day (BID) | CUTANEOUS | 5 refills | Status: DC | PRN

## 2023-10-01 MED ORDER — LEVOCETIRIZINE DIHYDROCHLORIDE 5 MG PO TABS
5.0000 mg | ORAL_TABLET | Freq: Two times a day (BID) | ORAL | 5 refills | Status: DC
Start: 2023-10-01 — End: 2024-03-23

## 2023-10-01 NOTE — Progress Notes (Signed)
FOLLOW UP  Date of Service/Encounter:  10/01/23   Assessment:   Flexural atopic dermatitis - with overlying prurigo nodularis (tried triamcinolone, betamethasone, hydrocortisone, desonide, and now Opzelura)  Dermatitis due to food taken internally - with negative testing to the most common foods  Perennial allergic rhinitis (dust mite)   Pruritus   Plan/Recommendations:   1. Flexural atopic dermatitis - I am going to review the records from [facility]. - We are going to get some labs to look for weird causes of itching. - Testing today was only positive to dust mite (avoidance measures provided). - Copy of testing results provided today.  - Sample of Opzelura provided today to use for itching.  - We can try sending this in to see if it is covered better.  - Start Xyzal 5 mg twice daily to help with itching. - Information on Dupixent provided today (this might help a LOT with the itching).   2. Dermatitis due to food - Testing was negative to the most common foods. - Copy of testing results provided today.   3. Return in about 6 weeks (around 11/12/2023). You can have the follow up appointment with Dr. Dellis Anes or a Nurse Practicioner (our Nurse Practitioners are excellent and always have Physician oversight!).   Subjective:   NOLAWI BUBIER is a 57 y.o. male presenting today for follow up of  Chief Complaint  Patient presents with   Allergy Testing    Preet A Payton has a history of the following: Patient Active Problem List   Diagnosis Date Noted   Rash 06/25/2023   Cough 06/25/2023   Orchitis of left testicle 06/25/2023   Degeneration of lumbar intervertebral disc 06/23/2023   Lack of sexual desire 06/23/2023   Minimal Nonobstructive CAD 09/30/2022   Rash and nonspecific skin eruption 08/04/2022   Numbness 06/11/2022   Other chronic pain 06/11/2022   Hypercalcemia 05/31/2022   Small bowel obstruction (HCC) 05/31/2022   Hyperlipidemia  09/17/2021   Dry mouth 09/17/2021   B12 deficiency 04/15/2021   Post-COVID chronic dyspnea 01/10/2021   Chronic constipation 10/10/2019   Chronic low back pain 04/15/2019   Chronic insomnia 03/24/2019   Abdominal wall hernia 10/06/2018   Arthralgia 06/01/2017   Diabetes (HCC) 10/01/2016   Meralgia paresthetica of right side 01/25/2016   Vitamin D deficiency 01/25/2016   Male sexual dysfunction 01/25/2016   Allergic rhinitis 02/07/2011   OBSTRUCTIVE SLEEP APNEA, moderate 10/23/2010   GERD 03/12/2010   Irritable bowel syndrome 03/12/2010   DIARRHEA, RECURRENT 02/05/2010    History obtained from: chart review and patient.  Discussed the use of AI scribe software for clinical note transcription with the patient and/or guardian, who gave verbal consent to proceed.  Elier is a 57 y.o. male presenting for skin testing. He was last seen on October 15. We could not do testing because his insurance company does not cover testing on the same day as a New Patient visit. He has been off of all antihistamines 3 days in anticipation of the testing.   We reviewed his dermatology notes from [facility].  It looks like he has been on betamethasone as well as desonide.  He was also on hydroxyzine and gabapentin.  He did have a skin tag removed.  He had a bacterial culture taken.  This was from a wound.  The bacterial culture grew out normal skin flora.  It does not look like anything was sent for a biopsy.  Otherwise, there have been no  changes to his past medical history, surgical history, family history, or social history.    Review of systems otherwise negative other than that mentioned in the HPI.    Objective:   Physical exam deferred since this was a skin testing appointment only.   Diagnostic studies:    Allergy Studies:     Airborne Adult Perc - 10/01/23 1500     Time Antigen Placed 1518    Allergen Manufacturer Waynette Buttery    Location Back    Number of Test 55    1.  Control-Buffer 50% Glycerol Negative    2. Control-Histamine 3+    3. Bahia Negative    4. French Southern Territories Negative    5. Johnson Negative    6. Kentucky Blue Negative    7. Meadow Fescue Negative    8. Perennial Rye Negative    9. Timothy Negative    10. Ragweed Mix Negative    11. Cocklebur Negative    12. Plantain,  English Negative    13. Baccharis Negative    14. Dog Fennel Negative    15. Russian Thistle Negative    16. Lamb's Quarters Negative    17. Sheep Sorrell Negative    18. Rough Pigweed Negative    19. Marsh Elder, Rough Negative    20. Mugwort, Common Negative    21. Box, Elder Negative    22. Cedar, red Negative    23. Sweet Gum Negative    24. Pecan Pollen Negative    25. Pine Mix Negative    26. Walnut, Black Pollen Negative    27. Red Mulberry Negative    28. Ash Mix Negative    29. Birch Mix Negative    30. Beech American Negative    31. Cottonwood, Guinea-Bissau Negative    32. Hickory, White Negative    33. Maple Mix Negative    34. Oak, Guinea-Bissau Mix Negative    35. Sycamore Eastern Negative    36. Alternaria Alternata Negative    37. Cladosporium Herbarum Negative    38. Aspergillus Mix Negative    39. Penicillium Mix Negative    40. Bipolaris Sorokiniana (Helminthosporium) Negative    41. Drechslera Spicifera (Curvularia) Negative    42. Mucor Plumbeus Negative    43. Fusarium Moniliforme Negative    44. Aureobasidium Pullulans (pullulara) Negative    45. Rhizopus Oryzae Negative    46. Botrytis Cinera Negative    47. Epicoccum Nigrum Negative    48. Phoma Betae Negative    49. Dust Mite Mix Negative    50. Cat Hair 10,000 BAU/ml Negative    51.  Dog Epithelia Negative    52. Mixed Feathers Negative    53. Horse Epithelia Negative    54. Cockroach, German Negative    55. Tobacco Leaf Negative             13 Food Perc - 10/01/23 1500       Test Information   Time Antigen Placed 1518    Allergen Manufacturer Waynette Buttery    Location Back    Number  of allergen test 13      Food   1. Peanut Negative    2. Soybean Negative    3. Wheat Negative    4. Sesame Negative    5. Milk, Cow Negative    6. Casein Negative    7. Egg White, Chicken Negative    8. Shellfish Mix Negative    9. Fish Mix Negative  10. Cashew Negative    11. Walnut Food Negative    12. Almond Negative    13. Hazelnut Negative             Intradermal - 10/01/23 1552     Time Antigen Placed 1400    Allergen Manufacturer Greer    Location Arm    Number of Test 16    Control Negative    Bahia Negative    French Southern Territories Negative    Johnson Negative    7 Grass Negative    Ragweed Mix Negative    Weed Mix Negative    Tree Mix Negative    Mold 1 Negative    Mold 2 Negative    Mold 3 Negative    Mold 4 Negative    Mite Mix 3+    Cat Negative    Dog Negative    Cockroach Negative             Allergy testing results were read and interpreted by myself, documented by clinical staff.      Malachi Bonds, MD  [facility]

## 2023-10-01 NOTE — Patient Instructions (Addendum)
1. Flexural atopic dermatitis - I am going to review the records from [facility]. - We are going to get some labs to look for weird causes of itching. - Testing today was only positive to dust mite (avoidance measures provided). - Copy of testing results provided today.  - Sample of Opzelura provided today to use for itching.  - We can try sending this in to see if it is covered better.  - Start Xyzal 5 mg twice daily to help with itching. - Information on Dupixent provided today (this might help a LOT with the itching).   2. Dermatitis due to food - Testing was negative to the most common foods. - Copy of testing results provided today.   3. Return in about 6 weeks (around 11/12/2023). You can have the follow up appointment with Dr. Dellis Anes or a Nurse Practicioner (our Nurse Practitioners are excellent and always have Physician oversight!).    Please inform us of any Emergency Department visits, hospitalizations, or changes in symptoms. Call us before going to the ED for breathing or allergy symptoms since we might be able to fit you in for a sick visit. Feel free to contact us anytime with any questions, problems, or concerns.  It was a pleasure to see you again today!  Websites that have reliable patient information: 1. American Academy of Asthma, Allergy, and Immunology: www.aaaai.org 2. Food Allergy Research and Education (FARE): foodallergy.org 3. Mothers of Asthmatics: http://www.asthmacommunitynetwork.org 4. American College of Allergy, Asthma, and Immunology: www.acaai.org   COVID-19 Vaccine Information can be found at: [email or web address] For questions related to vaccine distribution or appointments, please email [email or web address] or call [phone].     "Like" [place] on Facebook and Instagram for our latest updates!      A healthy democracy works best when [employer] participate! Make sure you are  registered to vote! If you have moved or changed any of your contact information, you will need to get this updated before voting! Scan the QR codes below to learn more!          Airborne Adult Perc - 10/01/23 1500     Time Antigen Placed 1518    Allergen Manufacturer Waynette Buttery    Location Back    Number of Test 55    1. Control-Buffer 50% Glycerol Negative    2. Control-Histamine 3+    3. Bahia Negative    4. French Southern Territories Negative    5. Johnson Negative    6. [place] Blue Negative    7. Meadow Fescue Negative    8. Perennial Rye Negative    9. Timothy Negative    10. Ragweed Mix Negative    11. Cocklebur Negative    12. Plantain,  English Negative    13. Baccharis Negative    14. Dog Fennel Negative    15. Russian Thistle Negative    16. Lamb's Quarters Negative    17. Sheep Sorrell Negative    18. Rough Pigweed Negative    19. Marsh Elder, Rough Negative    20. Mugwort, Common Negative    21. Box, Elder Negative    22. Cedar, red Negative    23. Sweet Gum Negative    24. Pecan Pollen Negative    25. Pine Mix Negative    26. Walnut, Black Pollen Negative    27. Red Mulberry Negative    28. Ash Mix Negative    29. Birch Mix Negative    30. Beech American Negative    31. Cottonwood,  Eastern Negative    32. Hickory, White Negative    33. Maple Mix Negative    34. Oak, Guinea-Bissau Mix Negative    35. Sycamore Eastern Negative    36. Alternaria Alternata Negative    37. Cladosporium Herbarum Negative    38. Aspergillus Mix Negative    39. Penicillium Mix Negative    40. Bipolaris Sorokiniana (Helminthosporium) Negative    41. Drechslera Spicifera (Curvularia) Negative    42. Mucor Plumbeus Negative    43. Fusarium Moniliforme Negative    44. Aureobasidium Pullulans (pullulara) Negative    45. Rhizopus Oryzae Negative    46. Botrytis Cinera Negative    47. Epicoccum Nigrum Negative    48. Phoma Betae Negative    49. Dust Mite Mix Negative    50. Cat Hair 10,000 BAU/ml  Negative    51.  Dog Epithelia Negative    52. Mixed Feathers Negative    53. Horse Epithelia Negative    54. Cockroach, German Negative    55. Tobacco Leaf Negative             13 Food Perc - 10/01/23 1500       Test Information   Time Antigen Placed 1518    Allergen Manufacturer Waynette Buttery    Location Back    Number of allergen test 13      Food   1. Peanut Negative    2. Soybean Negative    3. Wheat Negative    4. Sesame Negative    5. Milk, Cow Negative    6. Casein Negative    7. Egg White, Chicken Negative    8. Shellfish Mix Negative    9. Fish Mix Negative    10. Cashew Negative    11. Walnut Food Negative    12. Almond Negative    13. Hazelnut Negative             Intradermal - 10/01/23 1552     Time Antigen Placed 1400    Allergen Manufacturer Greer    Location Arm    Number of Test 16    Control Negative    Bahia Negative    French Southern Territories Negative    Johnson Negative    7 Grass Negative    Ragweed Mix Negative    Weed Mix Negative    Tree Mix Negative    Mold 1 Negative    Mold 2 Negative    Mold 3 Negative    Mold 4 Negative    Mite Mix 3+    Cat Negative    Dog Negative    Cockroach Negative             Control of Dust Mite Allergen    Dust mites play a major role in allergic asthma and rhinitis.  They occur in environments with high humidity wherever human skin is found.  Dust mites absorb humidity from the atmosphere (ie, they do not drink) and feed on organic matter (including shed human and animal skin).  Dust mites are a microscopic type of insect that you cannot see with the naked eye.  High levels of dust mites have been detected from mattresses, pillows, carpets, upholstered furniture, bed covers, clothes, soft toys and any woven material.  The principal allergen of the dust mite is found in its feces.  A gram of dust may contain 1,000 mites and 250,000 fecal particles.  Mite antigen is easily measured in the air during house  cleaning activities.  Dust mites do not bite and do not cause harm to humans, other than by triggering allergies/asthma.    Ways to decrease your exposure to dust mites in your home:  Encase mattresses, box springs and pillows with a mite-impermeable barrier or cover   Wash sheets, blankets and drapes weekly in hot water (130 F) with detergent and dry them in a dryer on the hot setting.  Have the room cleaned frequently with a vacuum cleaner and a damp dust-mop.  For carpeting or rugs, vacuuming with a vacuum cleaner equipped with a high-efficiency particulate air (HEPA) filter.  The dust mite allergic individual should not be in a room which is being cleaned and should wait 1 hour after cleaning before going into the room. Do not sleep on upholstered furniture (eg, couches).   If possible removing carpeting, upholstered furniture and drapery from the home is ideal.  Horizontal blinds should be eliminated in the rooms where the person spends the most time (bedroom, study, television room).  Washable vinyl, roller-type shades are optimal. Remove all non-washable stuffed toys from the bedroom.  Wash stuffed toys weekly like sheets and blankets above.   Reduce indoor humidity to less than 50%.  Inexpensive humidity monitors can be purchased at most hardware stores.  Do not use a humidifier as can make the problem worse and are not recommended.

## 2023-10-02 ENCOUNTER — Telehealth: Payer: Self-pay

## 2023-10-02 NOTE — Telephone Encounter (Signed)
*  Asthma/Allergy  Pharmacy Patient Advocate Encounter   Received notification from CoverMyMeds that prior authorization for Opzelura 1.5% cream  is required/requested.   Insurance verification completed.   The patient is insured through [facility] .   Per test claim: PA required; PA started via CoverMyMeds. KEY [id number] . Waiting for clinical questions to populate.

## 2023-10-05 ENCOUNTER — Ambulatory Visit: Payer: Medicare Other | Admitting: Cardiology

## 2023-10-05 ENCOUNTER — Other Ambulatory Visit (HOSPITAL_COMMUNITY): Payer: Self-pay

## 2023-10-05 NOTE — Telephone Encounter (Signed)
Pharmacy Patient Advocate Encounter  Questions generated, answered, and submitted

## 2023-10-06 ENCOUNTER — Telehealth: Payer: Self-pay | Admitting: Allergy & Immunology

## 2023-10-06 LAB — PROTEIN ELECTROPHORESIS, SERUM, WITH REFLEX
A/G Ratio: 1.2 (ref 0.7–1.7)
Albumin ELP: 4.3 g/dL (ref 2.9–4.4)
Alpha 1: 0.2 g/dL (ref 0.0–0.4)
Alpha 2: 0.8 g/dL (ref 0.4–1.0)
Beta: 1.3 g/dL (ref 0.7–1.3)
Gamma Globulin: 1.2 g/dL (ref 0.4–1.8)
Globulin, Total: 3.5 g/dL (ref 2.2–3.9)
Total Protein: 7.8 g/dL (ref 6.0–8.5)

## 2023-10-06 LAB — ALPHA-GAL PANEL
Allergen Lamb IgE: <0.1 kU/L
Beef IgE: <0.1 kU/L
IgE (Immunoglobulin E), Serum: 115 [IU]/mL (ref 6–495)
O215-IgE Alpha-Gal: <0.1 kU/L
Pork IgE: <0.1 kU/L

## 2023-10-06 LAB — ANTINUCLEAR ANTIBODIES, IFA: ANA Titer 1: NEGATIVE

## 2023-10-06 LAB — C-REACTIVE PROTEIN: CRP: <1 mg/L (ref 0–10)

## 2023-10-06 LAB — TRYPTASE: Tryptase: 3.7 ug/L (ref 2.2–13.2)

## 2023-10-06 LAB — SEDIMENTATION RATE: Sed Rate: 10 mm/h (ref 0–30)

## 2023-10-06 NOTE — Telephone Encounter (Signed)
LVM for patient to call the office back to address his questions.

## 2023-10-06 NOTE — Telephone Encounter (Signed)
Patient called stating he did not know how to read his lab results. I advised the patient of the note that was in the chart on his labs. Patient states at his last visit it was discussed about him getting on a shot to help with itching.

## 2023-10-08 NOTE — Telephone Encounter (Signed)
Returned patient call - DOB/Pharmacy verified - stated he's still having a lot of itching and would like to go forth with beginning the Dupixent injections.  Patient advised of biologic process - message will be forwarded to provider and Ocie Bob, our Biologic Coordinator.  Patient advised he will need to come in to sign Biologic Consent form as well once everything has been processed.   Patient wanted to know if there is anything he can while he's waiting for the approval - advised provider would have to review and make recommendations - he will be contacted after that.   Patient verbalized understanding to all, no further questions.

## 2023-10-08 NOTE — Telephone Encounter (Signed)
Pt returned call, to address his questions

## 2023-10-10 NOTE — Telephone Encounter (Signed)
Routing to [employer].   Can we send in Episeram? I am not sure here that prescription goes.   Malachi Bonds, MD [facility]

## 2023-10-12 ENCOUNTER — Other Ambulatory Visit: Payer: Self-pay

## 2023-10-12 MED ORDER — OPZELURA 1.5 % EX CREA: TOPICAL_CREAM | CUTANEOUS | 2 refills | Status: DC

## 2023-10-12 MED ORDER — OPZELURA 1.5 % EX CREA: 1.0000 | TOPICAL_CREAM | Freq: Two times a day (BID) | CUTANEOUS | 5 refills | Status: DC | PRN

## 2023-10-12 NOTE — Telephone Encounter (Signed)
Received PA APPROVAL fax from [facility] for Opzelura: 10/05/23 - 10/04/24.  Called [facility] [address] - spoke to [employer], Pharmacologist - DOB verified- advised of above notation.  Tyra stated there is no profile for the Opzulura 1.5% cream at [address] or [address]  Tyra was advised I would send a new prescription now.  Called patient - DOB/DPR verified - LMOVM advising of the above notation - Rx sent [facility].  Prescriptions [facility] [address] and [facility].

## 2023-10-13 NOTE — Telephone Encounter (Signed)
L/m for patient to contact me to advise approval and submit for Dupixent

## 2023-10-16 ENCOUNTER — Ambulatory Visit: Payer: Medicare Other | Admitting: Internal Medicine

## 2023-10-20 ENCOUNTER — Telehealth: Payer: Self-pay | Admitting: Internal Medicine

## 2023-10-20 NOTE — Telephone Encounter (Signed)
Patient dropped off document Handicap Placard, to be filled out by provider. Patient requested to send it back via Call Patient to pick up within 7-days. Document is located in providers tray at front office.Please advise at Mobile [phone] (mobile)

## 2023-10-20 NOTE — Telephone Encounter (Signed)
Patient dropped off document Patient Assistance Application, to be filled out by provider. Patient requested to send it back via Mail within 7-days. Document is located in providers tray at front office.Please advise at Mobile [phone] (mobile)

## 2023-10-21 NOTE — Telephone Encounter (Signed)
Placed in folder to be signed

## 2023-10-21 NOTE — Telephone Encounter (Signed)
Spoke to patient and discussed his rx coverage and income. Will try to get him on PAP and mailed app to him to return to me. I did advise patient at this time of year with PAP working re-enrollments for next year may delay decision for approval or denial

## 2023-10-23 NOTE — Telephone Encounter (Signed)
LM for patient that form is ready for pickup.  Left up front.

## 2023-10-23 NOTE — Telephone Encounter (Signed)
LM for patient that form is ready for pickup (Copy made for his records) And original placed in mail today.  Left up front.

## 2023-10-26 ENCOUNTER — Encounter: Payer: Medicare Other | Admitting: Internal Medicine

## 2023-10-26 ENCOUNTER — Ambulatory Visit: Payer: Medicare Other | Admitting: Internal Medicine

## 2023-10-28 DIAGNOSIS — K080 Exfoliation of teeth due to systemic causes: Secondary | ICD-10-CM | POA: Diagnosis not present

## 2023-11-10 DIAGNOSIS — K080 Exfoliation of teeth due to systemic causes: Secondary | ICD-10-CM | POA: Diagnosis not present

## 2023-11-12 ENCOUNTER — Other Ambulatory Visit: Payer: Self-pay | Admitting: Internal Medicine

## 2024-01-19 ENCOUNTER — Other Ambulatory Visit: Payer: Self-pay | Admitting: Internal Medicine

## 2024-01-19 ENCOUNTER — Ambulatory Visit: Payer: Self-pay | Admitting: Internal Medicine

## 2024-01-19 NOTE — Telephone Encounter (Signed)
Copied from CRM [id number]. Topic: Clinical - Medication Refill >> Jan 19, 2024  2:45 PM Efraim Kaufmann C wrote: Most Recent Primary Care Visit:  Provider: BURNS, [name]  Department: [facility]  Visit Type: PHYSICAL  Date: 08/18/2023  Medication: famotidine (PEPCID) 20 MG tablet   Has the patient contacted their pharmacy? No (Agent: If no, request that the patient contact the pharmacy for the refill. If patient does not wish to contact the pharmacy document the reason why and proceed with request.) (Agent: If yes, when and what did the pharmacy advise?)  Is this the correct pharmacy for this prescription? Yes If no, delete pharmacy and type the correct one.  This is the patient's preferred pharmacy:  [id number] [place], [place] - [address] [place] [address] Phone: [phone] Fax: [phone]   Has the prescription been filled recently? No  Is the patient out of the medication? Yes  Has the patient been seen for an appointment in the last year OR does the patient have an upcoming appointment? Yes  Can we respond through MyChart? Yes  Agent: Please be advised that Rx refills may take up to 3 business days. We ask that you follow-up with your pharmacy.

## 2024-01-19 NOTE — Telephone Encounter (Signed)
Chief Complaint: Chest pain Symptoms: Sore throat, congestion  Frequency: Comes and goes Pertinent Negatives: Patient denies radiation of pain, cough, dizziness, nausea, fever, difficulty breathing  Disposition: [x] ED /[] Urgent Care (no appt availability in office) / [] Appointment(In office/virtual)/ []  [facility]/ [] Home Care/ [] Refused Recommended Disposition /[] [facility]/ []  Follow-up with PCP Additional Notes: Pt states he is having chest pain and is not sure if it is acid reflux.  Pt feels burning on left side of throat. Pt states he notices this pain especially at night. Pt states he has been out of the famotidine for 2 days. Pt asking if he needs abx. Pt advised to go to ED. Pt states he will call a friend to take him. Pt verbalized understanding and agrees to plan.   Copied from CRM [id number]. Topic: Clinical - Red Word Triage >> Jan 19, 2024  2:40 PM Melissa C wrote: Red Word that prompted transfer to Nurse Triage: patient has been having chest pains- thinks it is just acid reflux but just hasn't been feeling well. Patient has famotidine that can be put in to be refilled in the interim. Reason for Disposition  [1] Chest pain lasts > 5 minutes AND [2] occurred in past 3 days (72 hours) (Exception: Feels exactly the same as previously diagnosed heartburn and has accompanying sour taste in mouth.)  Answer Assessment - Initial Assessment Questions 1. LOCATION: "Where does it hurt?"       Left side of chest/throat 2. RADIATION: "Does the pain go anywhere else?" (e.g., into neck, jaw, arms, back)     Denies 3. ONSET: "When did the chest pain begin?" (Minutes, hours or days)      A few days 4. PATTERN: "Does the pain come and go, or has it been constant since it started?"  "Does it get worse with exertion?"      Comes and goes 6. SEVERITY: "How bad is the pain?"  (e.g., Scale 1-10; mild, moderate, or severe)    - MILD (1-3): doesn't interfere with normal activities      - MODERATE (4-7): interferes with normal activities or awakens from sleep    - SEVERE (8-10): excruciating pain, unable to do any normal activities       5 10. OTHER SYMPTOMS: "Do you have any other symptoms?" (e.g., dizziness, nausea, vomiting, sweating, fever, difficulty breathing, cough)       Denies  Protocols used: Chest Pain-A-AH

## 2024-01-25 ENCOUNTER — Other Ambulatory Visit: Payer: Self-pay | Admitting: Internal Medicine

## 2024-01-25 NOTE — Telephone Encounter (Signed)
 Last Fill: 08/17/23  Last OV: 08/18/23 Next OV: 02/16/24  Routing to provider for review/authorization.

## 2024-01-25 NOTE — Telephone Encounter (Signed)
 Copied from CRM [id number]. Topic: Clinical - Medication Refill >> Jan 25, 2024  5:10 PM Sonny Dandy B wrote: Most Recent Primary Care Visit:  Provider: BURNS, [name]  Department: [facility]  Visit Type: PHYSICAL  Date: 08/18/2023  Medication: famotidine (PEPCID) 20 MG tablet  Has the patient contacted their pharmacy? Yes (Agent: If no, request that the patient contact the pharmacy for the refill. If patient does not wish to contact the pharmacy document the reason why and proceed with request.) (Agent: If yes, when and what did the pharmacy advise?)  Is this the correct pharmacy for this prescription? Yes If no, delete pharmacy and type the correct one.  This is the patient's preferred pharmacy:  [id number] [place], [place] - [address] [place] [address] Phone: [phone] Fax: [phone]     Has the prescription been filled recently? Yes  Is the patient out of the medication? Yes  Has the patient been seen for an appointment in the last year OR does the patient have an upcoming appointment? Yes  Can we respond through MyChart? Yes  Agent: Please be advised that Rx refills may take up to 3 business days. We ask that you follow-up with your pharmacy.

## 2024-01-29 ENCOUNTER — Telehealth: Payer: Self-pay | Admitting: Internal Medicine

## 2024-01-29 ENCOUNTER — Telehealth: Payer: Self-pay | Admitting: Primary Care

## 2024-01-29 NOTE — Telephone Encounter (Signed)
 Copied from CRM [id number]. Topic: Clinical - Medical Advice >> Jan 29, 2024  3:22 PM Jon Gills C wrote: Reason for CRM: Patient called in stating that his chest is congestion, coughing up mucus, has an headache been taking OTC medication . His kids has flu and he has been showing symptoms would like for someone to give him a callback on want to do

## 2024-01-29 NOTE — Telephone Encounter (Signed)
 Yes

## 2024-01-29 NOTE — Telephone Encounter (Signed)
 Patient states need refill for Pepcid. Patient scheduled 03/09/2024 with Ames Dura NP. [facility] [address] Patien tphoe number is [phone].

## 2024-01-29 NOTE — Telephone Encounter (Signed)
 This patient was seeing Dr. Craige Cotta. He is scheduled to see you on 03/09/2024. Okay to refill?

## 2024-02-01 MED ORDER — FAMOTIDINE 20 MG PO TABS: ORAL_TABLET | ORAL | 2 refills | Status: DC

## 2024-02-01 NOTE — Telephone Encounter (Signed)
 I have sent in the refill.  Lm x1 for the patient.

## 2024-02-01 NOTE — Telephone Encounter (Signed)
 Appointment made with Dr. Okey Dupre tomorrow

## 2024-02-02 ENCOUNTER — Encounter: Payer: Self-pay | Admitting: Internal Medicine

## 2024-02-02 ENCOUNTER — Ambulatory Visit (INDEPENDENT_AMBULATORY_CARE_PROVIDER_SITE_OTHER): Payer: Medicare Other | Admitting: Internal Medicine

## 2024-02-02 VITALS — BP 88/68 | HR 66 | Temp 97.7°F | Ht 73.23 in | Wt 205.0 lb

## 2024-02-02 DIAGNOSIS — J189 Pneumonia, unspecified organism: Secondary | ICD-10-CM | POA: Diagnosis not present

## 2024-02-02 MED ORDER — AZITHROMYCIN 250 MG PO TABS: ORAL_TABLET | ORAL | 0 refills | Status: AC

## 2024-02-02 NOTE — Patient Instructions (Addendum)
 We will do azithromycin to take for possible infection. Take 2 pills on day 1, then on days 2-5 take 1 pill daily.

## 2024-02-02 NOTE — Assessment & Plan Note (Signed)
 Rhonchi on exam and worsening symptoms 2 weeks into course. Rx azithromycin to cover atypical. Follow up if no improvement and would recommend to get CXR if not improving in 2-3 days.

## 2024-02-02 NOTE — Progress Notes (Signed)
   Subjective:   Patient ID: [name], male    DOB: 09-20-1966, 58 y.o.   MRN: [id number]  HPI The patient is a 58 YO man coming in for viral symptoms for 2 weeks. Kids positive for flu last week. He is worsening with congestion in his chest and sputum some SOB. Having some back pain which is improved slightly today. Congestion in head as well.   Review of Systems  Constitutional:  Positive for activity change and appetite change.  HENT:  Positive for congestion.   Eyes: Negative.   Respiratory:  Positive for cough and shortness of breath. Negative for chest tightness.   Cardiovascular:  Negative for chest pain, palpitations and leg swelling.  Gastrointestinal:  Negative for abdominal distention, abdominal pain, constipation, diarrhea, nausea and vomiting.  Musculoskeletal:  Positive for back pain.  Skin: Negative.   Neurological: Negative.   Psychiatric/Behavioral: Negative.      Objective:  Physical Exam Constitutional:      Appearance: He is well-developed.  HENT:     Head: Normocephalic and atraumatic.  Cardiovascular:     Rate and Rhythm: Normal rate and regular rhythm.  Pulmonary:     Effort: Pulmonary effort is normal. No respiratory distress.     Breath sounds: Rhonchi present. No wheezing or rales.  Abdominal:     General: Bowel sounds are normal. There is no distension.     Palpations: Abdomen is soft.     Tenderness: There is no abdominal tenderness. There is no rebound.  Musculoskeletal:     Cervical back: Normal range of motion.  Skin:    General: Skin is warm and dry.  Neurological:     Mental Status: He is alert and oriented to person, place, and time.     Coordination: Coordination normal.     Vitals:   02/02/24 1531  BP: (!) 88/68  Pulse: 66  Temp: 97.7 F (36.5 C)  TempSrc: Oral  SpO2: 99%  Weight: 205 lb (93 kg)  Height: 6' 1.23" (1.86 m)    Assessment & Plan:

## 2024-02-03 NOTE — Telephone Encounter (Signed)
 I spoke with the pharmacy and they said the patient picked up the medication on 2/25.  Nothing further needed.

## 2024-02-15 ENCOUNTER — Encounter: Payer: Self-pay | Admitting: Internal Medicine

## 2024-02-15 NOTE — Progress Notes (Unsigned)
 Subjective:    Patient ID: [name], male    DOB: 18-Feb-1966, 58 y.o.   MRN: [id number]     HPI Digby is here for follow up of his chronic medical problems.  Having increased back pain and pain in right leg.  He sees orthopedics this month.  He was seen here last month - dx with CAP - rx'd zpak.   Ibuprofen 400 mg nightly for back  Medications and allergies reviewed with patient and updated if appropriate.  Current Outpatient Medications on File Prior to Visit  Medication Sig Dispense Refill   famotidine (PEPCID) 20 MG tablet TAKE 1 TABLET BY MOUTH EVERYDAY AT BEDTIME 30 tablet 2   hydrOXYzine (ATARAX) 25 MG tablet Take 25 mg by mouth every 8 (eight) hours as needed.     JANUVIA 100 MG tablet Take 1 tablet (100 mg total) by mouth daily. Annual appt due in August must see provider for future refills 90 tablet 0   lidocaine (HM LIDOCAINE PATCH) 4 % Place 1 patch onto the skin daily. 30 patch 0   OPZELURA 1.5 % CREA Apply 1 Application topically 2 (two) times daily as needed. 60 g 5   pantoprazole (PROTONIX) 40 MG tablet TAKE 1 TABLET BY MOUTH EVERY DAY 90 tablet 1   pimecrolimus (ELIDEL) 1 % cream Apply topically 2 (two) times daily. 30 g 0   polyethylene glycol powder (GLYCOLAX/MIRALAX) 17 GM/SCOOP powder Take 17 g by mouth daily. 238 g 0   rosuvastatin (CRESTOR) 5 MG tablet Take 1 tablet (5 mg total) by mouth at bedtime. 90 tablet 3   tacrolimus (PROTOPIC) 0.1 % ointment Apply topically 2 (two) times daily. 100 g 3   triamcinolone cream (KENALOG) 0.1 % APPLY 1 APPLICATION TOPICALLY 2 (TWO) TIMES DAILY AS NEEDED (RASH ON LEG). 30 g 0   levocetirizine (XYZAL) 5 MG tablet Take 1 tablet (5 mg total) by mouth in the morning and at bedtime. 60 tablet 5   No current facility-administered medications on file prior to visit.     Review of Systems  Constitutional:  Negative for fever.  Respiratory:  Positive for cough (residual from recent infection - improving) and  wheezing (once a year). Negative for shortness of breath.   Cardiovascular:  Positive for chest pain (left sided  - randomly comes and goes - feels it daily). Negative for palpitations and leg swelling.  Musculoskeletal:  Positive for back pain and neck pain.  Neurological:  Negative for light-headedness and headaches.       Objective:   Vitals:   02/16/24 1315  BP: 112/80  Pulse: 80  Temp: 98.6 F (37 C)  SpO2: 97%   BP Readings from Last 3 Encounters:  02/16/24 112/80  02/02/24 (!) 88/68  09/22/23 100/80   Wt Readings from Last 3 Encounters:  02/16/24 208 lb (94.3 kg)  02/02/24 205 lb (93 kg)  09/22/23 205 lb 1.6 oz (93 kg)   Body mass index is 27.27 kg/m.    Physical Exam Constitutional:      General: He is not in acute distress.    Appearance: Normal appearance. He is not ill-appearing.  HENT:     Head: Normocephalic and atraumatic.  Eyes:     Conjunctiva/sclera: Conjunctivae normal.  Cardiovascular:     Rate and Rhythm: Normal rate and regular rhythm.     Heart sounds: Normal heart sounds.  Pulmonary:     Effort: Pulmonary effort is normal. No respiratory distress.  Breath sounds: Normal breath sounds. No wheezing or rales.  Musculoskeletal:     Right lower leg: No edema.     Left lower leg: No edema.  Lymphadenopathy:     Cervical: Cervical adenopathy present.  Skin:    General: Skin is warm and dry.     Findings: No rash.  Neurological:     Mental Status: He is alert. Mental status is at baseline.  Psychiatric:        Mood and Affect: Mood normal.        Lab Results  Component Value Date   WBC 4.8 08/18/2023   HGB 14.9 08/18/2023   HCT 44.9 08/18/2023   PLT 208.0 08/18/2023   GLUCOSE 103 (H) 08/18/2023   CHOL 84 08/18/2023   TRIG 89.0 08/18/2023   HDL 38.00 (L) 08/18/2023   LDLDIRECT 64.0 09/30/2016   LDLCALC 28 08/18/2023   ALT 27 08/18/2023   AST 21 08/18/2023   NA 137 08/18/2023   K 4.1 08/18/2023   CL 101 08/18/2023    CREATININE 1.07 08/18/2023   BUN 11 08/18/2023   CO2 28 08/18/2023   TSH 0.92 05/02/2022   PSA 0.30 08/18/2023   INR 1.23 12/01/2009   HGBA1C 6.8 (H) 08/18/2023   MICROALBUR 2.7 (H) 04/23/2023     Assessment & Plan:    See Problem List for Assessment and Plan of chronic medical problems.

## 2024-02-15 NOTE — Patient Instructions (Addendum)
      Blood work was ordered.   A chest xray was ordered.      Medications changes include :   None      Return in about 6 months (around 08/18/2024) for Physical Exam.

## 2024-02-16 ENCOUNTER — Ambulatory Visit (INDEPENDENT_AMBULATORY_CARE_PROVIDER_SITE_OTHER)

## 2024-02-16 ENCOUNTER — Encounter: Payer: Self-pay | Admitting: Internal Medicine

## 2024-02-16 ENCOUNTER — Ambulatory Visit (INDEPENDENT_AMBULATORY_CARE_PROVIDER_SITE_OTHER): Payer: Medicare Other | Admitting: Internal Medicine

## 2024-02-16 VITALS — BP 112/80 | HR 80 | Temp 98.6°F | Ht 73.23 in | Wt 208.0 lb

## 2024-02-16 DIAGNOSIS — K219 Gastro-esophageal reflux disease without esophagitis: Secondary | ICD-10-CM

## 2024-02-16 DIAGNOSIS — E782 Mixed hyperlipidemia: Secondary | ICD-10-CM | POA: Diagnosis not present

## 2024-02-16 DIAGNOSIS — I2510 Atherosclerotic heart disease of native coronary artery without angina pectoris: Secondary | ICD-10-CM

## 2024-02-16 DIAGNOSIS — R079 Chest pain, unspecified: Secondary | ICD-10-CM | POA: Diagnosis not present

## 2024-02-16 DIAGNOSIS — Z7984 Long term (current) use of oral hypoglycemic drugs: Secondary | ICD-10-CM

## 2024-02-16 DIAGNOSIS — E1169 Type 2 diabetes mellitus with other specified complication: Secondary | ICD-10-CM

## 2024-02-16 LAB — HEMOGLOBIN A1C: Hgb A1c MFr Bld: 6.6 % — ABNORMAL HIGH (ref 4.6–6.5)

## 2024-02-16 LAB — MICROALBUMIN / CREATININE URINE RATIO
Creatinine,U: 173.9 mg/dL
Microalb Creat Ratio: 10.5 mg/g (ref 0.0–30.0)
Microalb, Ur: 1.8 mg/dL (ref 0.0–1.9)

## 2024-02-16 LAB — COMPREHENSIVE METABOLIC PANEL
ALT: 23 U/L (ref 0–53)
AST: 19 U/L (ref 0–37)
Albumin: 4.7 g/dL (ref 3.5–5.2)
Alkaline Phosphatase: 62 U/L (ref 39–117)
BUN: 9 mg/dL (ref 6–23)
CO2: 29 meq/L (ref 19–32)
Calcium: 10.1 mg/dL (ref 8.4–10.5)
Chloride: 100 meq/L (ref 96–112)
Creatinine, Ser: 0.99 mg/dL (ref 0.40–1.50)
GFR: 85.35 mL/min (ref >60.00)
Glucose, Bld: 130 mg/dL — ABNORMAL HIGH (ref 70–99)
Potassium: 4.5 meq/L (ref 3.5–5.1)
Sodium: 137 meq/L (ref 135–145)
Total Bilirubin: 1 mg/dL (ref 0.2–1.2)
Total Protein: 8 g/dL (ref 6.0–8.3)

## 2024-02-16 LAB — LIPID PANEL
Cholesterol: 86 mg/dL (ref 0–200)
HDL: 31.9 mg/dL — ABNORMAL LOW (ref >39.00)
LDL Cholesterol: 20 mg/dL (ref 0–99)
NonHDL: 53.9
Total CHOL/HDL Ratio: 3
Triglycerides: 170 mg/dL — ABNORMAL HIGH (ref 0.0–149.0)
VLDL: 34 mg/dL (ref 0.0–40.0)

## 2024-02-16 NOTE — Assessment & Plan Note (Signed)
 Chronic Following with cardiology-Dr. Servando Salina Denies symptoms consistent with angina LDL at goal BP well-controlled Sugars well-controlled Encouraged healthy diet Encouraged regular exercise

## 2024-02-16 NOTE — Assessment & Plan Note (Signed)
Chronic GERD controlled Continue pantoprazole 40 mg daily, famotidine 20 mg daily

## 2024-02-16 NOTE — Assessment & Plan Note (Signed)
 Chronic With hyperlipidemia  Lab Results  Component Value Date   HGBA1C 6.8 (H) 08/18/2023   Sugars controlled Check A1c Continue Januvia 100 mg daily-has been effective and well-tolerated Stressed regular exercise, diabetic diet Eye exam due-will schedule

## 2024-02-16 NOTE — Assessment & Plan Note (Signed)
 Acute Having intermittent left sided chest pain - was also having right sided chest pain with recent URI - that has resolved CAC 6.48 in 2022 - does not sound cardiac in nature Cxr today

## 2024-02-16 NOTE — Assessment & Plan Note (Signed)
Chronic Check lipid panel, CMP Continue Crestor 5 mg daily Stressed regular exercise, healthy diet

## 2024-02-23 ENCOUNTER — Telehealth: Payer: Self-pay

## 2024-02-23 NOTE — Telephone Encounter (Unsigned)
 Copied from CRM [id number]. Topic: Clinical - Lab/Test Results >> Feb 23, 2024  3:11 PM [name] wrote: Reason for CRM: Patient is requesting a call back from Dr. [name] or nurse to get his xray results from 3/11

## 2024-02-25 NOTE — Telephone Encounter (Signed)
 Please let her know I did look at the x-ray myself and I do not see anything concerning-it does look normal.  We are still waiting for the official radiology read-they are behind but I would expect to get the official results and I will send him a note via MyChart when I get this.

## 2024-02-25 NOTE — Telephone Encounter (Signed)
 Spoke with patient today.

## 2024-02-29 DIAGNOSIS — M5416 Radiculopathy, lumbar region: Secondary | ICD-10-CM | POA: Diagnosis not present

## 2024-02-29 DIAGNOSIS — M5451 Vertebrogenic low back pain: Secondary | ICD-10-CM | POA: Diagnosis not present

## 2024-02-29 DIAGNOSIS — M5459 Other low back pain: Secondary | ICD-10-CM | POA: Diagnosis not present

## 2024-02-29 DIAGNOSIS — M5412 Radiculopathy, cervical region: Secondary | ICD-10-CM | POA: Diagnosis not present

## 2024-03-03 ENCOUNTER — Encounter: Payer: Self-pay | Admitting: Internal Medicine

## 2024-03-04 ENCOUNTER — Ambulatory Visit: Payer: Self-pay

## 2024-03-04 NOTE — Telephone Encounter (Signed)
  Chief Complaint: severe intermitttnet pain behind left ear on the hairline Symptoms: tender to touch, 1 episode of dizziness last night, interfering with sleep  Frequency: Wed Pertinent Negatives: Patient denies weakness or numbness of face, arms or legs, difficulty speaking or hearing on current dizziness Disposition: [] ED /[] Urgent Care (no appt availability in office) / [x] Appointment(In office/virtual)/ []  [facility]/ [] Home Care/ [] Refused Recommended Disposition /[] [facility]/ []  Follow-up with PCP Additional Notes: call CAL line and spoke with Kimberly./ Discussed assessment and disposition. In case OM UC today and appt on Monday. Pt couldnot make appt for Monday but did for Tuesday. Advised pt to call back on Monday to update on his sx. Advised to go to UC to be evaluated.   Copied from CRM [id number]. Topic: Clinical - Red Word Triage >> Mar 04, 2024  1:19 PM Gurney Maxin H wrote: [facility] that prompted transfer to Nurse Triage: Sharp pain behind left ear on head close to ear Reason for Disposition  [1] SEVERE headache AND [2] sudden-onset (i.e., reaching maximum intensity within seconds to 1 hour)  [1] SEVERE headache (e.g., excruciating) AND [2] not improved after 2 hours of pain medicine  Answer Assessment - Initial Assessment Questions 1. LOCATION: "Where does it hurt?"      Behind left ear hairline  2. ONSET: "When did the headache start?" (Minutes, hours or days)      Wednesday   3. PATTERN: "Does the pain come and go, or has it been constant since it started?"     Comes and goes took Ibuprofen  4. SEVERITY: "How bad is the pain?" and "What does it keep you from doing?"  (e.g., Scale 1-10; mild, moderate, or severe)   - MILD (1-3): doesn't interfere with normal activities    - MODERATE (4-7): interferes with normal activities or awakens from sleep    - SEVERE (8-10): excruciating pain, unable to do any normal activities        Throbbing severe- kept awake   5. RECURRENT SYMPTOM: "Have you ever had headaches before?" If Yes, ask: "When was the last time?" and "What happened that time?"      *No Answer* 6. CAUSE: "What do you think is causing the headache?"     *No Answer* 7. MIGRAINE: "Have you been diagnosed with migraine headaches?" If Yes, ask: "Is this headache similar?"      *No Answer* 8. HEAD INJURY: "Has there been any recent injury to the head?"      *No Answer* 9. OTHER SYMPTOMS: "Do you have any other symptoms?" (fever, stiff neck, eye pain, sore throat, cold symptoms)     Yesterday: lightheadedness for a second almost fell lost balance  Protocols used: Headache-A-AH

## 2024-03-08 ENCOUNTER — Ambulatory Visit (INDEPENDENT_AMBULATORY_CARE_PROVIDER_SITE_OTHER): Admitting: Emergency Medicine

## 2024-03-08 ENCOUNTER — Encounter: Payer: Self-pay | Admitting: Emergency Medicine

## 2024-03-08 VITALS — BP 102/72 | HR 89 | Temp 97.8°F | Ht 73.23 in | Wt 200.0 lb

## 2024-03-08 DIAGNOSIS — L943 Sclerodactyly: Secondary | ICD-10-CM | POA: Insufficient documentation

## 2024-03-08 DIAGNOSIS — H6121 Impacted cerumen, right ear: Secondary | ICD-10-CM | POA: Diagnosis not present

## 2024-03-08 MED ORDER — PREDNISONE 20 MG PO TABS: 40.0000 mg | ORAL_TABLET | Freq: Every day | ORAL | 1 refills | Status: AC

## 2024-03-08 NOTE — Assessment & Plan Note (Signed)
 Earwax buildup Irrigated with saline solution with 100% clean up No infection.  No complications. Normal tympanic membrane post exam Able to hear well post exam

## 2024-03-08 NOTE — Assessment & Plan Note (Signed)
 Recommend to start prednisone 40 mg daily for 5 days Recommend dermatology evaluation.  Referral placed today. May have chronic autoimmune condition such as scleroderma

## 2024-03-08 NOTE — Patient Instructions (Signed)
 Health Maintenance, Male  Adopting a healthy lifestyle and getting preventive care are important in promoting health and wellness. Ask your health care provider about:  The right schedule for you to have regular tests and exams.  Things you can do on your own to prevent diseases and keep yourself healthy.  What should I know about diet, weight, and exercise?  Eat a healthy diet    Eat a diet that includes plenty of vegetables, fruits, low-fat dairy products, and lean protein.  Do not eat a lot of foods that are high in solid fats, added sugars, or sodium.  Maintain a healthy weight  Body mass index (BMI) is a measurement that can be used to identify possible weight problems. It estimates body fat based on height and weight. Your health care provider can help determine your BMI and help you achieve or maintain a healthy weight.  Get regular exercise  Get regular exercise. This is one of the most important things you can do for your health. Most adults should:  Exercise for at least 150 minutes each week. The exercise should increase your heart rate and make you sweat (moderate-intensity exercise).  Do strengthening exercises at least twice a week. This is in addition to the moderate-intensity exercise.  Spend less time sitting. Even light physical activity can be beneficial.  Watch cholesterol and blood lipids  Have your blood tested for lipids and cholesterol at 56 years of age, then have this test every 5 years.  You may need to have your cholesterol levels checked more often if:  Your lipid or cholesterol levels are high.  You are older than 56 years of age.  You are at high risk for heart disease.  What should I know about cancer screening?  Many types of cancers can be detected early and may often be prevented. Depending on your health history and family history, you may need to have cancer screening at various ages. This may include screening for:  Colorectal cancer.  Prostate cancer.  Skin cancer.  Lung  cancer.  What should I know about heart disease, diabetes, and high blood pressure?  Blood pressure and heart disease  High blood pressure causes heart disease and increases the risk of stroke. This is more likely to develop in people who have high blood pressure readings or are overweight.  Talk with your health care provider about your target blood pressure readings.  Have your blood pressure checked:  Every 3-5 years if you are 9-95 years of age.  Every year if you are 85 years old or older.  If you are between the ages of 29 and 29 and are a current or former smoker, ask your health care provider if you should have a one-time screening for abdominal aortic aneurysm (AAA).  Diabetes  Have regular diabetes screenings. This checks your fasting blood sugar level. Have the screening done:  Once every three years after age 23 if you are at a normal weight and have a low risk for diabetes.  More often and at a younger age if you are overweight or have a high risk for diabetes.  What should I know about preventing infection?  Hepatitis B  If you have a higher risk for hepatitis B, you should be screened for this virus. Talk with your health care provider to find out if you are at risk for hepatitis B infection.  Hepatitis C  Blood testing is recommended for:  Everyone born from [birth year] through 1963.  Anyone  with known risk factors for hepatitis C.  Sexually transmitted infections (STIs)  You should be screened each year for STIs, including gonorrhea and chlamydia, if:  You are sexually active and are younger than 56 years of age.  You are older than 56 years of age and your health care provider tells you that you are at risk for this type of infection.  Your sexual activity has changed since you were last screened, and you are at increased risk for chlamydia or gonorrhea. Ask your health care provider if you are at risk.  Ask your health care provider about whether you are at high risk for HIV. Your health care provider  may recommend a prescription medicine to help prevent HIV infection. If you choose to take medicine to prevent HIV, you should first get tested for HIV. You should then be tested every 3 months for as long as you are taking the medicine.  Follow these instructions at home:  Alcohol use  Do not drink alcohol if your health care provider tells you not to drink.  If you drink alcohol:  Limit how much you have to 0-2 drinks a day.  Know how much alcohol is in your drink. In the [place] one drink equals one 12 oz bottle of beer (355 mL), one 5 oz glass of wine (148 mL), or one 1 oz glass of hard liquor (44 mL).  Lifestyle  Do not use any products that contain nicotine or tobacco. These products include cigarettes, chewing tobacco, and vaping devices, such as e-cigarettes. If you need help quitting, ask your health care provider.  Do not use street drugs.  Do not share needles.  Ask your health care provider for help if you need support or information about quitting drugs.  General instructions  Schedule regular health, dental, and eye exams.  Stay current with your vaccines.  Tell your health care provider if:  You often feel depressed.  You have ever been abused or do not feel safe at home.  Summary  Adopting a healthy lifestyle and getting preventive care are important in promoting health and wellness.  Follow your health care provider's instructions about healthy diet, exercising, and getting tested or screened for diseases.  Follow your health care provider's instructions on monitoring your cholesterol and blood pressure.  This information is not intended to replace advice given to you by your health care provider. Make sure you discuss any questions you have with your health care provider.  Document Revised: 04/15/2021 Document Reviewed: 04/15/2021  Elsevier Patient Education  2024 [employer].

## 2024-03-08 NOTE — Progress Notes (Signed)
 [name] 58 y.o.   Chief Complaint  Patient presents with   Ear Pain    Patient states both ears feel like there is fluid in them also has a sting throb feeling this started last Tuesday. He's been very fatigue here lately. He also mentions his both of hands are very dry, peeling and numbness. He had this for a while but seems to be getting worse.     HISTORY OF PRESENT ILLNESS: This is a 58 y.o. male complaining of fullness to right ear with decreased hearing Also complaining of dryness, tightness, peeling and numbness to both hands  HPI   Prior to Admission medications   Medication Sig Start Date End Date Taking? Authorizing Provider  famotidine (PEPCID) 20 MG tablet TAKE 1 TABLET BY MOUTH EVERYDAY AT BEDTIME 02/01/24  Yes Glenford Bayley, NP  hydrOXYzine (ATARAX) 25 MG tablet Take 25 mg by mouth every 8 (eight) hours as needed. 08/13/23  Yes [provider]  JANUVIA 100 MG tablet Take 1 tablet (100 mg total) by mouth daily. Annual appt due in August must see provider for future refills 04/14/23  Yes Burns, [name], MD  lidocaine (HM LIDOCAINE PATCH) 4 % Place 1 patch onto the skin daily. 01/26/23  Yes Small, Brooke L, PA  OPZELURA 1.5 % CREA Apply 1 Application topically 2 (two) times daily as needed. 10/12/23  Yes Alfonse Spruce, MD  pantoprazole (PROTONIX) 40 MG tablet TAKE 1 TABLET BY MOUTH EVERY DAY 11/12/23  Yes Burns, [name], MD  pimecrolimus (ELIDEL) 1 % cream Apply topically 2 (two) times daily. 09/28/23  Yes Alfonse Spruce, MD  polyethylene glycol powder [facility]) 17 GM/SCOOP powder Take 17 g by mouth daily. 06/04/22  Yes Rolly Salter, MD  rosuvastatin (CRESTOR) 5 MG tablet Take 1 tablet (5 mg total) by mouth at bedtime. 09/16/23  Yes Tobb, Kardie, DO  tacrolimus (PROTOPIC) 0.1 % ointment Apply topically 2 (two) times daily. 09/22/23  Yes Alfonse Spruce, MD  traMADol (ULTRAM) 50 MG tablet Take 50 mg by mouth as needed for moderate  pain (pain score 4-6). 02/29/24  Yes [provider]  triamcinolone cream (KENALOG) 0.1 % APPLY 1 APPLICATION TOPICALLY 2 (TWO) TIMES DAILY AS NEEDED (RASH ON LEG). 10/07/22  Yes Burns, [name], MD  levocetirizine (XYZAL) 5 MG tablet Take 1 tablet (5 mg total) by mouth in the morning and at bedtime. 10/01/23 10/31/23  Alfonse Spruce, MD    Allergies  Allergen Reactions   Metformin And Related     diarrhea    Patient Active Problem List   Diagnosis Date Noted   CAP (community acquired pneumonia) 02/02/2024   Rash 06/25/2023   Cough 06/25/2023   Orchitis of left testicle 06/25/2023   Degeneration of lumbar intervertebral disc 06/23/2023   Lack of sexual desire 06/23/2023   Minimal Nonobstructive CAD 09/30/2022   Rash and nonspecific skin eruption 08/04/2022   Numbness 06/11/2022   Other chronic pain 06/11/2022   Hypercalcemia 05/31/2022   Small bowel obstruction (HCC) 05/31/2022   Hyperlipidemia 09/17/2021   Dry mouth 09/17/2021   B12 deficiency 04/15/2021   Post-COVID chronic dyspnea 01/10/2021   Chronic constipation 10/10/2019   Chronic low back pain 04/15/2019   Chronic insomnia 03/24/2019   Abdominal wall hernia 10/06/2018   Arthralgia 06/01/2017   Left-sided chest pain 06/01/2017   Diabetes (HCC) 10/01/2016   Meralgia paresthetica of right side 01/25/2016   Vitamin D deficiency 01/25/2016   Male  sexual dysfunction 01/25/2016   Allergic rhinitis 02/07/2011   OBSTRUCTIVE SLEEP APNEA, moderate 10/23/2010   GERD 03/12/2010   Irritable bowel syndrome 03/12/2010   DIARRHEA, RECURRENT 02/05/2010    Past Medical History:  Diagnosis Date   Allergy    Chest wall pain    Cough    Diabetes (HCC)    Diarrhea recurrent   GERD (gastroesophageal reflux disease)    Headache(784.0)    Irritable bowel syndrome    Obstructive sleep apnea    apnealink 10/23/10 AHI 5, Sp02 low 87%   Postural lightheadedness    SBO (small bowel obstruction) (HCC) 12/2015   Sleep  apnea    11/27/21 will get new test    Past Surgical History:  Procedure Laterality Date   CHOLECYSTECTOMY  2009   COLECTOMY  1994   segmental   COLONOSCOPY  07/17/2011   hemorrhoids, otherwise normal including terminal ileum and random colon biopises   GASTROSTOMY  2005   stamm   LAPAROTOMY  2002, 2005, 2009   SPHINCTEROTOMY  2009   lateral internal   UPPER GASTROINTESTINAL ENDOSCOPY  07/17/2011   normal, including duodenal biopsies    Social History   Socioeconomic History   Marital status: Married    Spouse name: Not on file   Number of children: 4   Years of education: Not on file   Highest education level: Associate degree: academic program  Occupational History   Occupation: FORK LIFT DRIVER    Employer: [employer]    Comment: 11/27/21 out of work  Tobacco Use   Smoking status: Never    Passive exposure: Never   Smokeless tobacco: Never  Vaping Use   Vaping status: Never Used  Substance and Sexual Activity   Alcohol use: Never   Drug use: Never   Sexual activity: Not on file  Other Topics Concern   Not on file  Social History Narrative   Occupation: Group Lead Scientist, research (physical sciences) (deliveries)-disabled in 2022   Patient has never smoked.   Alcohol Use-no   Illicit Drug Use-no   Married-Divorced-Married again 2009-    4 children 2 sons and 2 daughters   [place]ian heritage   No regular exercise   Social Drivers of Corporate investment banker Strain: Low Risk  (08/17/2023)   Overall Financial Resource Strain (CARDIA)    Difficulty of Paying Living Expenses: Not very hard  Food Insecurity: No Food Insecurity (08/17/2023)   Hunger Vital Sign    Worried About Running Out of Food in the Last Year: Never true    Ran Out of Food in the Last Year: Never true  Transportation Needs: No Transportation Needs (08/17/2023)   PRAPARE - Administrator, Civil Service (Medical): No    Lack of Transportation (Non-Medical): No  Physical Activity: Inactive  (08/17/2023)   Exercise Vital Sign    Days of Exercise per Week: 0 days    Minutes of Exercise per Session: 0 min  Stress: No Stress Concern Present (08/17/2023)   [employer] of Occupational Health - Occupational Stress Questionnaire    Feeling of Stress : Not at all  Social Connections: Socially Isolated (08/17/2023)   Social Connection and Isolation Panel [NHANES]    Frequency of Communication with Friends and Family: Once a week    Frequency of Social Gatherings with Friends and Family: Never    Attends Religious Services: Never    Database administrator or Organizations: No    Attends Banker  Meetings: Never    Marital Status: Married  Catering manager Violence: Not At Risk (08/12/2023)   Humiliation, Afraid, Rape, and Kick questionnaire    Fear of Current or Ex-Partner: No    Emotionally Abused: No    Physically Abused: No    Sexually Abused: No    Family History  Problem Relation Age of Onset   Hypertension Mother    Liver disease Father        liver cancer???   Colon cancer Neg Hx    Esophageal cancer Neg Hx    Rectal cancer Neg Hx    Stomach cancer Neg Hx      Review of Systems  Constitutional: Negative.   HENT:  Positive for hearing loss. Negative for sore throat.   Respiratory: Negative.  Negative for cough and shortness of breath.   Cardiovascular: Negative.  Negative for chest pain and palpitations.  Gastrointestinal:  Negative for abdominal pain, diarrhea, nausea and vomiting.  Genitourinary: Negative.  Negative for dysuria and hematuria.  Skin:  Positive for itching and rash.  Neurological: Negative.  Negative for dizziness and headaches.  All other systems reviewed and are negative.   Vitals:   03/08/24 1509  BP: 102/72  Pulse: 89  Temp: 97.8 F (36.6 C)  SpO2: 95%    Physical Exam Vitals reviewed.  Constitutional:      Appearance: Normal appearance.  HENT:     Head: Normocephalic.     Right Ear: There is impacted cerumen.      Left Ear: Tympanic membrane, ear canal and external ear normal.  Eyes:     Extraocular Movements: Extraocular movements intact.     Conjunctiva/sclera: Conjunctivae normal.  Cardiovascular:     Rate and Rhythm: Normal rate and regular rhythm.     Pulses: Normal pulses.     Heart sounds: Normal heart sounds.  Pulmonary:     Effort: Pulmonary effort is normal.     Breath sounds: Normal breath sounds.  Musculoskeletal:     Cervical back: No tenderness.  Lymphadenopathy:     Cervical: No cervical adenopathy.  Skin:    General: Skin is warm and dry.     Comments: Hands: Fingers show shiny and distended erythematous skin  Neurological:     Mental Status: He is alert and oriented to person, place, and time.  Psychiatric:        Mood and Affect: Mood normal.        Behavior: Behavior normal.         Ceruminosis is noted.  After obtaining verbal consent from patient wax is removed by syringing and manual debridement.  No complications.  Tolerated procedure well.  Post irrigation exam of tympanic membranes within normal limits.  Instructions for home care to prevent wax buildup are given.   ASSESSMENT & PLAN: A total of 32 minutes was spent with the patient and counseling/coordination of care regarding preparing for this visit, review of most recent office visit notes, review of multiple chronic medical conditions and their management, review of all medications, review of most recent bloodwork results, review of health maintenance items, education on nutrition, prognosis, documentation, and need for follow up.   Problem List Items Addressed This Visit       Nervous and Auditory   Hearing loss of right ear due to cerumen impaction - Primary   Earwax buildup Irrigated with saline solution with 100% clean up No infection.  No complications. Normal tympanic membrane post exam Able to hear well  post exam        Musculoskeletal and Integument   Sclerodactyly   Recommend to start  prednisone 40 mg daily for 5 days Recommend dermatology evaluation.  Referral placed today. May have chronic autoimmune condition such as scleroderma      Relevant Medications   predniSONE (DELTASONE) 20 MG tablet   Other Relevant Orders   Ambulatory referral to Dermatology   Patient Instructions  Health Maintenance, Male Adopting a healthy lifestyle and getting preventive care are important in promoting health and wellness. Ask your health care provider about: The right schedule for you to have regular tests and exams. Things you can do on your own to prevent diseases and keep yourself healthy. What should I know about diet, weight, and exercise? Eat a healthy diet  Eat a diet that includes plenty of vegetables, fruits, low-fat dairy products, and lean protein. Do not eat a lot of foods that are high in solid fats, added sugars, or sodium. Maintain a healthy weight Body mass index (BMI) is a measurement that can be used to identify possible weight problems. It estimates body fat based on height and weight. Your health care provider can help determine your BMI and help you achieve or maintain a healthy weight. Get regular exercise Get regular exercise. This is one of the most important things you can do for your health. Most adults should: Exercise for at least 150 minutes each week. The exercise should increase your heart rate and make you sweat (moderate-intensity exercise). Do strengthening exercises at least twice a week. This is in addition to the moderate-intensity exercise. Spend less time sitting. Even light physical activity can be beneficial. Watch cholesterol and blood lipids Have your blood tested for lipids and cholesterol at 56 years of age, then have this test every 5 years. You may need to have your cholesterol levels checked more often if: Your lipid or cholesterol levels are high. You are older than 56 years of age. You are at high risk for heart disease. What  should I know about cancer screening? Many types of cancers can be detected early and may often be prevented. Depending on your health history and family history, you may need to have cancer screening at various ages. This may include screening for: Colorectal cancer. Prostate cancer. Skin cancer. Lung cancer. What should I know about heart disease, diabetes, and high blood pressure? Blood pressure and heart disease High blood pressure causes heart disease and increases the risk of stroke. This is more likely to develop in people who have high blood pressure readings or are overweight. Talk with your health care provider about your target blood pressure readings. Have your blood pressure checked: Every 3-5 years if you are 67-46 years of age. Every year if you are 37 years old or older. If you are between the ages of 23 and 11 and are a current or former smoker, ask your health care provider if you should have a one-time screening for abdominal aortic aneurysm (AAA). Diabetes Have regular diabetes screenings. This checks your fasting blood sugar level. Have the screening done: Once every three years after age 35 if you are at a normal weight and have a low risk for diabetes. More often and at a younger age if you are overweight or have a high risk for diabetes. What should I know about preventing infection? Hepatitis B If you have a higher risk for hepatitis B, you should be screened for this virus. Talk with your health care  provider to find out if you are at risk for hepatitis B infection. Hepatitis C Blood testing is recommended for: Everyone born from [birth year] through 1963. Anyone with known risk factors for hepatitis C. Sexually transmitted infections (STIs) You should be screened each year for STIs, including gonorrhea and chlamydia, if: You are sexually active and are younger than 56 years of age. You are older than 56 years of age and your health care provider tells you that you are  at risk for this type of infection. Your sexual activity has changed since you were last screened, and you are at increased risk for chlamydia or gonorrhea. Ask your health care provider if you are at risk. Ask your health care provider about whether you are at high risk for HIV. Your health care provider may recommend a prescription medicine to help prevent HIV infection. If you choose to take medicine to prevent HIV, you should first get tested for HIV. You should then be tested every 3 months for as long as you are taking the medicine. Follow these instructions at home: Alcohol use Do not drink alcohol if your health care provider tells you not to drink. If you drink alcohol: Limit how much you have to 0-2 drinks a day. Know how much alcohol is in your drink. In the [place] one drink equals one 12 oz bottle of beer (355 mL), one 5 oz glass of wine (148 mL), or one 1 oz glass of hard liquor (44 mL). Lifestyle Do not use any products that contain nicotine or tobacco. These products include cigarettes, chewing tobacco, and vaping devices, such as e-cigarettes. If you need help quitting, ask your health care provider. Do not use street drugs. Do not share needles. Ask your health care provider for help if you need support or information about quitting drugs. General instructions Schedule regular health, dental, and eye exams. Stay current with your vaccines. Tell your health care provider if: You often feel depressed. You have ever been abused or do not feel safe at home. Summary Adopting a healthy lifestyle and getting preventive care are important in promoting health and wellness. Follow your health care provider's instructions about healthy diet, exercising, and getting tested or screened for diseases. Follow your health care provider's instructions on monitoring your cholesterol and blood pressure. This information is not intended to replace advice given to you by your health care provider.  Make sure you discuss any questions you have with your health care provider. Document Revised: 04/15/2021 Document Reviewed: 04/15/2021 Elsevier Patient Education  2024 Elsevier Inc.    Edwina Barth, MD [facility]

## 2024-03-09 ENCOUNTER — Ambulatory Visit: Payer: Medicare Other | Admitting: Primary Care

## 2024-03-09 ENCOUNTER — Encounter: Payer: Self-pay | Admitting: Primary Care

## 2024-03-09 VITALS — BP 113/79 | HR 86 | Temp 97.7°F | Ht 73.0 in | Wt 206.0 lb

## 2024-03-09 DIAGNOSIS — K219 Gastro-esophageal reflux disease without esophagitis: Secondary | ICD-10-CM

## 2024-03-09 DIAGNOSIS — G4733 Obstructive sleep apnea (adult) (pediatric): Secondary | ICD-10-CM

## 2024-03-09 DIAGNOSIS — F5104 Psychophysiologic insomnia: Secondary | ICD-10-CM

## 2024-03-09 NOTE — Progress Notes (Signed)
 @Patient  ID: [name], male    DOB: July 13, 1966, 58 y.o.   MRN: [id number]  Chief Complaint  Patient presents with   Follow-up    Osa f/u    Referring provider: [name], MD  HPI: 58 year old male, never smoked. PMH significant for OSA, allergic rhinitis, IBS, GERD, diabetes, chronic lower back pain, insomnia, hyperlipidemia, vit D deficiency.   03/09/2024 Discussed the use of AI scribe software for clinical note transcription with the patient, who gave verbal consent to proceed.  History of Present Illness   Nivek A Burcher is a 58 year old male with obstructive sleep apnea who presents for follow-up regarding CPAP use and sleep quality.  He has a history of obstructive sleep apnea, initially diagnosed in 2019 with a home sleep study showing moderate sleep apnea with 18 apneic events per hour and a lowest oxygen saturation of 87%. An in-lab sleep study in 2023 included CPAP titration at a pressure of 7 cm H2O, which was effective without supplemental oxygen. Despite a prescription for a CPAP machine, he has not been able to obtain one due to insurance and financial issues, specifically the requirement of a credit card for purchase. He used the CPAP machine twice but found it uncomfortable and has not used it since.  He experiences significant sleep disturbances, stating he did not sleep until nine o'clock in the morning. He often wakes up to use the bathroom and sometimes has nights without sleep if he does not take his pain medication. Snoring is noted by his wife, and he typically sleeps on his right side. He has attempted weight loss, reducing from 217 pounds to around 200 pounds, attributed to dietary changes, particularly reducing sugar intake.  He is currently on several medications for chronic pain. He also takes medications for diabetes and acid reflux, including famotidine and Protonix. His acid reflux is somewhat controlled, but he notes the medication is  expensive.  He reports nasal dryness and has been using Vicks for relief.   He is currently on disability and not working, spending most of his time in bed due to his condition.      Allergies  Allergen Reactions   Metformin And Related     diarrhea    Immunization History  Administered Date(s) Administered   Influenza Inj Mdck Quad Pf 08/25/2018, 08/29/2021   Influenza Split 08/10/2017   Influenza Whole 09/07/2010   Influenza, Seasonal, Injecte, Preservative Fre 08/18/2023   Influenza,inj,Quad PF,6+ Mos 08/18/2017, 08/29/2019, 08/29/2020, 08/04/2022   Influenza-Unspecified 08/20/2016   Pneumococcal Polysaccharide-23 12/04/2016   Tdap 04/17/2011, 08/04/2022   Zoster Recombinant(Shingrix) 10/06/2018    Past Medical History:  Diagnosis Date   Allergy    Chest wall pain    Cough    Diabetes (HCC)    Diarrhea recurrent   GERD (gastroesophageal reflux disease)    Headache(784.0)    Irritable bowel syndrome    Obstructive sleep apnea    apnealink 10/23/10 AHI 5, Sp02 low 87%   Postural lightheadedness    SBO (small bowel obstruction) (HCC) 12/2015   Sleep apnea    11/27/21 will get new test    Tobacco History: Social History   Tobacco Use  Smoking Status Never   Passive exposure: Never  Smokeless Tobacco Never   Counseling given: Not Answered   Outpatient Medications Prior to Visit  Medication Sig Dispense Refill   famotidine (PEPCID) 20 MG tablet TAKE 1 TABLET BY MOUTH EVERYDAY AT BEDTIME 30 tablet 2  hydrOXYzine (ATARAX) 25 MG tablet Take 25 mg by mouth every 8 (eight) hours as needed.     JANUVIA 100 MG tablet Take 1 tablet (100 mg total) by mouth daily. Annual appt due in August must see provider for future refills 90 tablet 0   lidocaine (HM LIDOCAINE PATCH) 4 % Place 1 patch onto the skin daily. 30 patch 0   OPZELURA 1.5 % CREA Apply 1 Application topically 2 (two) times daily as needed. 60 g 5   pantoprazole (PROTONIX) 40 MG tablet TAKE 1 TABLET BY  MOUTH EVERY DAY 90 tablet 1   pimecrolimus (ELIDEL) 1 % cream Apply topically 2 (two) times daily. 30 g 0   polyethylene glycol powder (GLYCOLAX/MIRALAX) 17 GM/SCOOP powder Take 17 g by mouth daily. 238 g 0   predniSONE (DELTASONE) 20 MG tablet Take 2 tablets (40 mg total) by mouth daily with breakfast for 5 days. 10 tablet 1   rosuvastatin (CRESTOR) 5 MG tablet Take 1 tablet (5 mg total) by mouth at bedtime. 90 tablet 3   tacrolimus (PROTOPIC) 0.1 % ointment Apply topically 2 (two) times daily. 100 g 3   traMADol (ULTRAM) 50 MG tablet Take 50 mg by mouth as needed for moderate pain (pain score 4-6).     triamcinolone cream (KENALOG) 0.1 % APPLY 1 APPLICATION TOPICALLY 2 (TWO) TIMES DAILY AS NEEDED (RASH ON LEG). 30 g 0   levocetirizine (XYZAL) 5 MG tablet Take 1 tablet (5 mg total) by mouth in the morning and at bedtime. 60 tablet 5   No facility-administered medications prior to visit.      Review of Systems  Review of Systems   Physical Exam  BP 113/79 (BP Location: Left Arm, Patient Position: Sitting, Cuff Size: Large)   Pulse 86   Temp 97.7 F (36.5 C) (Temporal)   Ht 6\' 1"  (1.854 m)   Wt 206 lb (93.4 kg)   SpO2 97%   BMI 27.18 kg/m  Physical Exam Constitutional:      General: He is not in acute distress.    Appearance: Normal appearance. He is not ill-appearing.  HENT:     Head: Normocephalic and atraumatic.  Cardiovascular:     Rate and Rhythm: Normal rate and regular rhythm.  Pulmonary:     Effort: Pulmonary effort is normal.     Breath sounds: Normal breath sounds.  Musculoskeletal:        General: Normal range of motion.     Comments: Wearing neck brace  Skin:    General: Skin is warm and dry.  Neurological:     General: No focal deficit present.     Mental Status: He is alert and oriented to person, place, and time. Mental status is at baseline.  Psychiatric:        Mood and Affect: Mood normal.        Behavior: Behavior normal.        Thought Content:  Thought content normal.        Judgment: Judgment normal.      Lab Results:  CBC    Component Value Date/Time   WBC 4.8 08/18/2023 1415   RBC 5.37 08/18/2023 1415   HGB 14.9 08/18/2023 1415   HCT 44.9 08/18/2023 1415   PLT 208.0 08/18/2023 1415   MCV 83.7 08/18/2023 1415   MCV 90.3 01/23/2014 1656   MCH 28.7 01/26/2023 1700   MCHC 33.2 08/18/2023 1415   RDW 13.8 08/18/2023 1415   LYMPHSABS 2.7 08/18/2023 1415  MONOABS 0.4 08/18/2023 1415   EOSABS 0.1 08/18/2023 1415   BASOSABS 0.0 08/18/2023 1415    BMET    Component Value Date/Time   NA 137 02/16/2024 1401   NA 140 04/01/2021 1509   K 4.5 02/16/2024 1401   CL 100 02/16/2024 1401   CO2 29 02/16/2024 1401   GLUCOSE 130 (H) 02/16/2024 1401   BUN 9 02/16/2024 1401   BUN 8 04/01/2021 1509   CREATININE 0.99 02/16/2024 1401   CREATININE 1.04 05/02/2022 1634   CALCIUM 10.1 02/16/2024 1401   CALCIUM 9.3 05/31/2022 1633   GFRNONAA >60 01/26/2023 1700   GFRAA >60 04/09/2018 1242    BNP No results found for: "BNP"  ProBNP No results found for: "PROBNP"  Imaging: DG Chest 2 View Result Date: 03/03/2024 CLINICAL DATA:  intermittent left sided chest pain EXAM: CHEST - 2 VIEW COMPARISON:  Chest x-ray 01/26/2023 FINDINGS: The heart and mediastinal contours are within normal limits. No focal consolidation. No pulmonary edema. No pleural effusion. No pneumothorax. No acute osseous abnormality. IMPRESSION: No active cardiopulmonary disease. Electronically Signed   By: Tish Frederickson M.D.   On: 03/03/2024 01:57     Assessment & Plan:   1. OSA (obstructive sleep apnea) (Primary) - Ambulatory Referral for DME  2. Gastroesophageal reflux disease, unspecified whether esophagitis present  3. Chronic insomnia  Assessment and Plan    Obstructive Sleep Apnea Moderate OSA with effective CPAP management at pressure 7cm h20. Non-compliance with CPAP due to financial constraints; he was unable to received CPAP machine that was  ordered back in June by Dr. Craige Cotta.  - Explore CPAP acquisition without credit card. - Recommend weight loss. - Suggest wedge pillow for sleep positioning.  Gastroesophageal Reflux Disease (GERD) GERD managed with famotidine and Protonix. Occasional bile acid reflux, concerned about medication costs. - Refill famotidine and Protonix as needed.  Nasal Dryness Nasal dryness relieved with Vicks, seeking alternative relief. - Recommend AYR nasal gel and/or saline nasal spray    Insomnia - Recommend OTC melatonin 3-5mg  at bedtime     Glenford Bayley, NP 03/09/2024

## 2024-03-09 NOTE — Patient Instructions (Addendum)
-  OBSTRUCTIVE SLEEP APNEA: Obstructive sleep apnea is a condition where your breathing repeatedly stops and starts during sleep. We discussed the importance of using your CPAP machine, and we will explore options to obtain one without needing a credit card. We also recommend continuing your weight loss efforts and trying a wedge pillow to improve your sleep positioning.  -GASTROESOPHAGEAL REFLUX DISEASE (GERD): GERD is a condition where stomach acid frequently flows back into the tube connecting your mouth and stomach. We will continue to manage this with famotidine and Protonix, and I have refilled your prescriptions as needed.  -NASAL DRYNESS: Nasal dryness is a condition where the nasal passages become dry and uncomfortable. We recommend trying AYR nasal gel to keep your nasal passages moist.  INSTRUCTIONS: Please follow up with [place] to explore options for obtaining a CPAP machine without a credit card. Continue your weight loss efforts and try using a wedge pillow for better sleep positioning. Use AYR nasal gel for nasal dryness relief. Refill your medications for GERD as needed. If you have any concerns or experience any new symptoms, please contact our office.  Follow-up 4 months with Dr. Wynona Neat or Waynetta Sandy NP (30 min slot)

## 2024-03-10 ENCOUNTER — Telehealth: Payer: Self-pay | Admitting: Internal Medicine

## 2024-03-10 NOTE — Telephone Encounter (Signed)
 Copied from CRM [id number]. Topic: Referral - Question >> Mar 09, 2024  4:41 PM Melissa C wrote: Reason for CRM: patient spoke with Dermatology office that Referral was sent to and they are unable to get him in until August. They said that if "Urgent" or "Stat" can be placed on referral and resent, maybe they can get him in sooner, however right now they are unable to do so without doctors orders. Patient stated with his fingers bleeding he is need of urgency. Please advise.

## 2024-03-14 NOTE — Telephone Encounter (Signed)
 Orders has been change to urgent

## 2024-03-14 NOTE — Addendum Note (Signed)
 Addended by: Aundra Millet on: 03/14/2024 11:55 AM   Modules accepted: Orders

## 2024-03-16 ENCOUNTER — Encounter: Payer: Self-pay | Admitting: Dermatology

## 2024-03-16 ENCOUNTER — Ambulatory Visit: Admitting: Dermatology

## 2024-03-16 VITALS — BP 106/72 | HR 72

## 2024-03-16 DIAGNOSIS — L309 Dermatitis, unspecified: Secondary | ICD-10-CM

## 2024-03-16 MED ORDER — CLOBETASOL PROPIONATE 0.05 % EX OINT: 1.0000 | TOPICAL_OINTMENT | Freq: Two times a day (BID) | CUTANEOUS | 5 refills | Status: DC

## 2024-03-16 MED ORDER — OPZELURA 1.5 % EX CREA: 1.0000 | TOPICAL_CREAM | Freq: Two times a day (BID) | CUTANEOUS | 6 refills | Status: AC

## 2024-03-16 NOTE — Patient Instructions (Addendum)
 Hello [name],  Thank you for visiting today. Here is a summary of the key instructions:  Diagnosis: Eczema of the Hands  - Medications:   - Use clobetasol twice a day for one week when hands get red   - Switch to Opzelura after one week of clobetasol use   - Use moisturizers when not having a flare-up  - Lifestyle Changes:   - Always wear gloves when cleaning or washing dishes   - Wearing gloves at night and when working is okay, but don't overdo it  - Follow-up: Return for a follow-up appointment in 3 months  - Additional Instructions:   - A new prescription will be sent to your pharmacy   - If this treatment plan doesn't work, we may consider Dupixent in the future  Please reach out if you have any questions or concerns.  Warm regards,  Dr. Langston Reusing, Dermatology     Important Information   Due to recent changes in healthcare laws, you may see results of your pathology and/or laboratory studies on MyChart before the doctors have had a chance to review them. We understand that in some cases there may be results that are confusing or concerning to you. Please understand that not all results are received at the same time and often the doctors may need to interpret multiple results in order to provide you with the best plan of care or course of treatment. Therefore, we ask that you please give [place] 2 business days to thoroughly review all your results before contacting the office for clarification. Should we see a critical lab result, you will be contacted sooner.     If You Need Anything After Your Visit   If you have any questions or concerns for your doctor, please call our main line at [phone]. If no one answers, please leave a voicemail as directed and we will return your call as soon as possible. Messages left after 4 pm will be answered the following business day.    You may also send [place] a message via MyChart. We typically respond to MyChart messages within 1-2  business days.  For prescription refills, please ask your pharmacy to contact our office. Our fax number is [phone].  If you have an urgent issue when the clinic is closed that cannot wait until the next business day, you can page your doctor at the number below.     Please note that while we do our best to be available for urgent issues outside of office hours, we are not available 24/7.    If you have an urgent issue and are unable to reach [place], you may choose to seek medical care at your doctor's office, retail clinic, urgent care center, or emergency room.   If you have a medical emergency, please immediately call 911 or go to the emergency department. In the event of inclement weather, please call our main line at [phone] for an update on the status of any delays or closures.  Dermatology Medication Tips: Please keep the boxes that topical medications come in in order to help keep track of the instructions about where and how to use these. Pharmacies typically print the medication instructions only on the boxes and not directly on the medication tubes.   If your medication is too expensive, please contact our office at [phone] or send [place] a message through MyChart.    We are unable to tell what your co-pay for medications will be in advance as  this is different depending on your insurance coverage. However, we may be able to find a substitute medication at lower cost or fill out paperwork to get insurance to cover a needed medication.    If a prior authorization is required to get your medication covered by your insurance company, please allow [place] 1-2 business days to complete this process.   Drug prices often vary depending on where the prescription is filled and some pharmacies may offer cheaper prices.   The website www.goodrx.com contains coupons for medications through different pharmacies. The prices here do not account for what the cost may be with help from insurance  (it may be cheaper with your insurance), but the website can give you the price if you did not use any insurance.  - You can print the associated coupon and take it with your prescription to the pharmacy.  - You may also stop by our office during regular business hours and pick up a GoodRx coupon card.  - If you need your prescription sent electronically to a different pharmacy, notify our office through [facility] or by phone at [phone]

## 2024-03-16 NOTE — Progress Notes (Signed)
   New Patient Visit   Subjective  [name] is a 58 y.o. male who presents for the following: Hand Dermatitis  Patient states he  has Hand Dermatitis located at the hands that he  would like to have examined. Patient reports the areas have been there for 22 years. He reports the areas are bothersome. Patient rates irritation 10 out of 10. She states that the areas have not spread. Patient reports he has previously been treated for these areas (Dr. Aris Lot, [facility]). He has previously tried and failed Opzelura, Tacrolimus and Pimecrolimus.  The following portions of the chart were reviewed this encounter and updated as appropriate: medications, allergies, medical history  Review of Systems:  No other skin or systemic complaints except as noted in HPI or Assessment and Plan.  Objective  Well appearing patient in no apparent distress; mood and affect are within normal limits.  A focused examination was performed of the following areas: B/L LE and Hands  Relevant exam findings are noted in the Assessment and Plan.                Assessment & Plan   HAND DERMATITIS Exam: Good Muscle tone and strength and good capillary reflux, No Erythema noted on exam today  Not at goal  Hand Dermatitis is a chronic type of eczema that can come and go on the hands and fingers.  While there is no cure, the rash and symptoms can be managed with topical prescription medications, and for more severe cases, with systemic medications.  Recommend mild soap and routine use of moisturizing cream after handwashing.  Minimize soap/water exposure when possible.    - Assessment: Patient presents with recurrent issues affecting hands and lips, with recent exacerbation of hand symptoms. Symptoms include dryness, peeling skin, occasional bleeding, redness, numbness, and inflammation. The condition affects both hands equally. Patient has a history of diabetes managed by Dr. Kennyth Arnold.  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 Current presentation is consistent with hand dermatitis, similar to eczema. Flares may be triggered by stress, environmental factors, or occur spontaneously.  - Plan:    Prescribe clobetasol for acute flares: apply twice daily for one week    Switch to Opzelura after clobetasol course    Use moisturizers for maintenance between flares    Wear protective gloves during cleaning and dishwashing    Continue use of elastic gloves at night and during activities, but avoid overuse    Consider Dupixent as a future treatment option if current regimen is ineffective    New prescription to be sent to patient's pharmacy  No follow-ups on file.  I, Nell Range, am acting as Neurosurgeon for [employer], DO.  Documentation: I have reviewed the above documentation for accuracy and completeness, and I agree with the above.  Steven Reusing, DO

## 2024-03-23 ENCOUNTER — Other Ambulatory Visit: Payer: Self-pay | Admitting: Allergy & Immunology

## 2024-03-28 ENCOUNTER — Telehealth: Payer: Self-pay | Admitting: Primary Care

## 2024-03-28 NOTE — Telephone Encounter (Signed)
 Cmn received from [facility] for CPAP and BiPAP supplies.

## 2024-03-29 NOTE — Telephone Encounter (Signed)
 Rc'd signed Dr.'s copy. Fax'd to [phone]

## 2024-03-30 ENCOUNTER — Ambulatory Visit: Admitting: Internal Medicine

## 2024-03-30 ENCOUNTER — Encounter: Payer: Self-pay | Admitting: Internal Medicine

## 2024-03-30 VITALS — BP 102/64 | HR 74 | Ht 73.0 in | Wt 207.0 lb

## 2024-03-30 DIAGNOSIS — R143 Flatulence: Secondary | ICD-10-CM | POA: Diagnosis not present

## 2024-03-30 DIAGNOSIS — R140 Abdominal distension (gaseous): Secondary | ICD-10-CM | POA: Diagnosis not present

## 2024-03-30 DIAGNOSIS — K582 Mixed irritable bowel syndrome: Secondary | ICD-10-CM

## 2024-03-30 DIAGNOSIS — R141 Gas pain: Secondary | ICD-10-CM | POA: Diagnosis not present

## 2024-03-30 DIAGNOSIS — R142 Eructation: Secondary | ICD-10-CM

## 2024-03-30 MED ORDER — DICYCLOMINE HCL 20 MG PO TABS: 20.0000 mg | ORAL_TABLET | Freq: Four times a day (QID) | ORAL | 1 refills | Status: AC | PRN

## 2024-03-30 MED ORDER — METRONIDAZOLE 500 MG PO TABS
500.0000 mg | ORAL_TABLET | Freq: Two times a day (BID) | ORAL | 0 refills | Status: DC
Start: 2024-03-30 — End: 2024-04-05

## 2024-03-30 NOTE — Patient Instructions (Addendum)
 We have sent medications to your pharmacy for you to pick up at your convenience. Give the medicine until June to work and then let us  know if you have more issues.   Stop artifical sweeteners.   _______________________________________________________  If your blood pressure at your visit was 140/90 or greater, please contact your primary care physician to follow up on this.  _______________________________________________________  If you are age 56 or older, your body mass index should be between 23-30. Your Body mass index is 27.31 kg/m. If this is out of the aforementioned range listed, please consider follow up with your Primary Care Provider.  If you are age 71 or younger, your body mass index should be between 19-25. Your Body mass index is 27.31 kg/m. If this is out of the aformentioned range listed, please consider follow up with your Primary Care Provider.   ________________________________________________________  The [place] [facility] providers would like to encourage you to use MYCHART to communicate with providers for non-urgent requests or questions.  Due to long hold times on the telephone, sending your provider a message by [facility] may be a faster and more efficient way to get a response.  Please allow 48 business hours for a response.  Please remember that this is for non-urgent requests.  _______________________________________________________  I appreciate the opportunity to care for you. [name], MD, [facility]

## 2024-03-30 NOTE — Progress Notes (Signed)
 [name] 58 y.o. November 28, 1966 [id number]  Assessment & Plan:   Encounter Diagnoses  Name Primary?   Irritable bowel syndrome with both constipation and diarrhea Yes   Bloating    Flatulence, eructation and gas pain    This sounds like an exacerbation of his IBS.  Last year metronidazole  was prescribed and we are thinking that helped since he has not been back in a year.  Plan to retreat with metronidazole , suspicious of component of SIBO in this patient.  Dicyclomine  as needed as well.  Eliminate artificial sweeteners  Follow-up as needed, the patient was advised it could take weeks for the full effects of metronidazole  to be felt.  Meds ordered this encounter  Medications   metroNIDAZOLE  (FLAGYL ) 500 MG tablet    Sig: Take 1 tablet (500 mg total) by mouth 2 (two) times daily.    Dispense:  20 tablet    Refill:  0   dicyclomine  (BENTYL ) 20 MG tablet    Sig: Take 1 tablet (20 mg total) by mouth every 6 (six) hours as needed for spasms (abdominal pain).    Dispense:  90 tablet    Refill:  1       Subjective:  Patient consented to the use of artificial intelligence scribe application.  Chief Complaint: Gas bloating abdominal pain and alternating bowel habits  HPI 58 year old man with IBS who was last seen twice in early 2024 with bloating and abdominal distention and altered bowel habits after he had what I thought was an infectious gastroenteritis.  He was treated symptomatically with dicyclomine  and Reglan  and then I prescribed empiric metronidazole  to see if that would help with his bloating and distention complaints.  He has not been seen since. He has been experiencing significant gas and abdominal pain for the past three weeks, with the pain affecting the entire abdomen. He notes difficulty in expelling gas, which sometimes requires him to change positions in bed.  He belches frequently also.  He cannot seem to get relief either way.  He experiences alternating  episodes of diarrhea and constipation. Diarrhea occurs approximately every two days, often after eating, and is described as soft and sometimes  watery. He takes Miralax  daily before bed.  He has been using Culturelle for digestive health for the past two weeks, which he is not sure if it has provided  relief. He drinks a cup of milk daily but has not noticed any correlation between milk consumption and his symptoms. He uses artificial sweeteners due to diabetes, which he adds to his tea.  He recalls being prescribed an antibiotic about a year ago, which he believes helped alleviate similar symptoms at that time.  There is a family history of liver disease, with his father and brothers affected, but recent liver tests have been normal.  He was seen in pulmonary 03/09/2024.  He has sleep apnea but he is not using CPAP as it made him uncomfortable and financial issues.  Wt Readings from Last 3 Encounters:  03/30/24 207 lb (93.9 kg)  03/09/24 206 lb (93.4 kg)  03/08/24 200 lb (90.7 kg)  May 2024 210 pounds     Chemistry      Component Value Date/Time   NA 137 02/16/2024 1401   NA 140 04/01/2021 1509   K 4.5 02/16/2024 1401   CL 100 02/16/2024 1401   CO2 29 02/16/2024 1401   BUN 9 02/16/2024 1401   BUN 8 04/01/2021 1509   CREATININE 0.99 02/16/2024 1401  CREATININE 1.04 05/02/2022 1634      Component Value Date/Time   CALCIUM  10.1 02/16/2024 1401   CALCIUM  9.3 05/31/2022 1633   ALKPHOS 62 02/16/2024 1401   AST 19 02/16/2024 1401   ALT 23 02/16/2024 1401   BILITOT 1.0 02/16/2024 1401   BILITOT 0.9 09/23/2021 1510      Colonoscopy 10/28/2018-normal  Allergies  Allergen Reactions   Metformin  And Related     diarrhea   Current Meds  Medication Sig   clobetasol  ointment (TEMOVATE ) 0.05 % Apply 1 Application topically 2 (two) times daily. Apply 2 times a day on hands for 1 week then STOP   dicyclomine  (BENTYL ) 20 MG tablet Take 1 tablet (20 mg total) by mouth every 6 (six)  hours as needed for spasms (abdominal pain).   famotidine  (PEPCID ) 20 MG tablet TAKE 1 TABLET BY MOUTH EVERYDAY AT BEDTIME   hydrOXYzine (ATARAX) 25 MG tablet Take 25 mg by mouth every 8 (eight) hours as needed.   JANUVIA  100 MG tablet Take 1 tablet (100 mg total) by mouth daily. Annual appt due in August must see provider for future refills   levocetirizine (XYZAL ) 5 MG tablet TAKE 1 TABLET (5 MG TOTAL) BY MOUTH IN THE MORNING AND AT BEDTIME   lidocaine  (HM LIDOCAINE  PATCH) 4 % Place 1 patch onto the skin daily.   metroNIDAZOLE  (FLAGYL ) 500 MG tablet Take 1 tablet (500 mg total) by mouth 2 (two) times daily.   OPZELURA  1.5 % CREA Apply 1 Application topically 2 (two) times daily. Apply 2 times daily to hands   pantoprazole  (PROTONIX ) 40 MG tablet TAKE 1 TABLET BY MOUTH EVERY DAY   pimecrolimus  (ELIDEL ) 1 % cream Apply topically 2 (two) times daily.   polyethylene glycol powder (GLYCOLAX /MIRALAX ) 17 GM/SCOOP powder Take 17 g by mouth daily.   rosuvastatin  (CRESTOR ) 5 MG tablet Take 1 tablet (5 mg total) by mouth at bedtime.   tacrolimus  (PROTOPIC ) 0.1 % ointment Apply topically 2 (two) times daily.   traMADol  (ULTRAM ) 50 MG tablet Take 50 mg by mouth as needed for moderate pain (pain score 4-6).   triamcinolone  cream (KENALOG ) 0.1 % APPLY 1 APPLICATION TOPICALLY 2 (TWO) TIMES DAILY AS NEEDED (RASH ON LEG).   Past Medical History:  Diagnosis Date   Allergy     Chest wall pain    Cough    Diabetes (HCC)    Diarrhea recurrent   GERD (gastroesophageal reflux disease)    Headache(784.0)    Irritable bowel syndrome    Obstructive sleep apnea    apnealink 10/23/10 AHI 5, Sp02 low 87%   Postural lightheadedness    SBO (small bowel obstruction) (HCC) 12/2015   Sleep apnea    11/27/21 will get new test   Past Surgical History:  Procedure Laterality Date   CHOLECYSTECTOMY  2009   COLECTOMY  1994   segmental   COLONOSCOPY  07/17/2011   hemorrhoids, otherwise normal including terminal ileum  and random colon biopises   GASTROSTOMY  2005   stamm   LAPAROTOMY  2002, 2005, 2009   SPHINCTEROTOMY  2009   lateral internal   UPPER GASTROINTESTINAL ENDOSCOPY  07/17/2011   normal, including duodenal biopsies   Social History   Social History Narrative   Occupation: Group Lead Scientist, research (physical sciences) (deliveries)-disabled in 2022   Patient has never smoked.   Alcohol Use-no   Illicit Drug Use-no   Married-Divorced-Married again 2009-    4 children 2 sons and 2 daughters   [place] heritage  No regular exercise   family history includes Hypertension in his mother; Liver disease in his father.   Review of Systems As per HPI. Recent office visits for cerumen impaction, low back pain, diabetes follow-up allergy  evaluation due to skin rashes have been reviewed as well.  Objective:   Physical Exam BP 102/64   Pulse 74   Ht 6\' 1"  (1.854 m)   Wt 207 lb (93.9 kg)   BMI 27.31 kg/m  Well-developed well-nourished no acute distress. Patient is wearing a soft collar neck brace and a back brace.  The abdomen is soft somewhat diffusely tender more pronounced in the right lower quadrant but I can press deeply there is no rebound guarding or mass.  Bowel sounds are present.  There are well-healed surgical scars.

## 2024-03-31 ENCOUNTER — Other Ambulatory Visit: Payer: Self-pay | Admitting: Internal Medicine

## 2024-04-01 ENCOUNTER — Emergency Department (HOSPITAL_COMMUNITY)
Admission: EM | Admit: 2024-04-01 | Discharge: 2024-04-02 | Disposition: A | Discharge status: Home / Self Care | Attending: Emergency Medicine | Admitting: Emergency Medicine

## 2024-04-01 ENCOUNTER — Other Ambulatory Visit: Payer: Self-pay

## 2024-04-01 ENCOUNTER — Encounter (HOSPITAL_COMMUNITY): Payer: Self-pay

## 2024-04-01 ENCOUNTER — Emergency Department (HOSPITAL_COMMUNITY)

## 2024-04-01 ENCOUNTER — Telehealth: Payer: Self-pay | Admitting: Internal Medicine

## 2024-04-01 DIAGNOSIS — R112 Nausea with vomiting, unspecified: Secondary | ICD-10-CM | POA: Insufficient documentation

## 2024-04-01 DIAGNOSIS — R109 Unspecified abdominal pain: Secondary | ICD-10-CM | POA: Diagnosis not present

## 2024-04-01 DIAGNOSIS — Z9049 Acquired absence of other specified parts of digestive tract: Secondary | ICD-10-CM | POA: Diagnosis not present

## 2024-04-01 DIAGNOSIS — R1084 Generalized abdominal pain: Secondary | ICD-10-CM | POA: Diagnosis not present

## 2024-04-01 DIAGNOSIS — R197 Diarrhea, unspecified: Secondary | ICD-10-CM | POA: Diagnosis not present

## 2024-04-01 DIAGNOSIS — N281 Cyst of kidney, acquired: Secondary | ICD-10-CM | POA: Diagnosis not present

## 2024-04-01 LAB — CBC
HCT: 46.4 % (ref 39.0–52.0)
Hemoglobin: 16.2 g/dL (ref 13.0–17.0)
MCH: 28.7 pg (ref 26.0–34.0)
MCHC: 34.9 g/dL (ref 30.0–36.0)
MCV: 82.3 fL (ref 80.0–100.0)
Platelets: 203 10*3/uL (ref 150–400)
RBC: 5.64 MIL/uL (ref 4.22–5.81)
RDW: 12.9 % (ref 11.5–15.5)
WBC: 7.1 10*3/uL (ref 4.0–10.5)
nRBC: 0 % (ref 0.0–0.2)

## 2024-04-01 LAB — COMPREHENSIVE METABOLIC PANEL WITH GFR
ALT: 23 U/L (ref 0–44)
AST: 20 U/L (ref 15–41)
Albumin: 4.2 g/dL (ref 3.5–5.0)
Alkaline Phosphatase: 47 U/L (ref 38–126)
Anion gap: 12 (ref 5–15)
BUN: 11 mg/dL (ref 6–20)
CO2: 20 mmol/L — ABNORMAL LOW (ref 22–32)
Calcium: 9.8 mg/dL (ref 8.9–10.3)
Chloride: 102 mmol/L (ref 98–111)
Creatinine, Ser: 1.28 mg/dL — ABNORMAL HIGH (ref 0.61–1.24)
GFR, Estimated: >60 mL/min (ref >60)
Glucose, Bld: 140 mg/dL — ABNORMAL HIGH (ref 70–99)
Potassium: 4.1 mmol/L (ref 3.5–5.1)
Sodium: 134 mmol/L — ABNORMAL LOW (ref 135–145)
Total Bilirubin: 2.3 mg/dL — ABNORMAL HIGH (ref 0.0–1.2)
Total Protein: 7.5 g/dL (ref 6.5–8.1)

## 2024-04-01 LAB — LIPASE, BLOOD: Lipase: 29 U/L (ref 11–51)

## 2024-04-01 MED ORDER — ONDANSETRON HCL 4 MG/2ML IJ SOLN
4.0000 mg | Freq: Once | INTRAMUSCULAR | Status: AC
  Administered 2024-04-01: 4 mg via INTRAVENOUS
  Filled 2024-04-01: qty 2

## 2024-04-01 MED ORDER — SODIUM CHLORIDE 0.9 % IV BOLUS
1000.0000 mL | Freq: Once | INTRAVENOUS | Status: AC
  Administered 2024-04-02: 1000 mL via INTRAVENOUS

## 2024-04-01 MED ORDER — MORPHINE SULFATE (PF) 4 MG/ML IV SOLN
4.0000 mg | Freq: Once | INTRAVENOUS | Status: AC
  Administered 2024-04-01: 4 mg via INTRAVENOUS
  Filled 2024-04-01: qty 1

## 2024-04-01 MED ORDER — IOHEXOL 350 MG/ML SOLN
75.0000 mL | Freq: Once | INTRAVENOUS | Status: AC | PRN
  Administered 2024-04-01: 75 mL via INTRAVENOUS

## 2024-04-01 NOTE — ED Triage Notes (Signed)
 Pt states "I think I have a blockage in my intestines". ABD pain x2 days. Pt reports diarrhea/"just water". H/x SBO.

## 2024-04-01 NOTE — Telephone Encounter (Signed)
 Patient has taken Flagyl  2 days. He reports extreme nausea. "I didn't sleep last night because of bad nausea." He has not taken the Flagyl  today.  Do you want him to stop it or can we given him Zofran ? Pharmacy [facility] [address]

## 2024-04-01 NOTE — Telephone Encounter (Signed)
 Stop metronidazole   Have him call back Monday with an overall update

## 2024-04-01 NOTE — Telephone Encounter (Signed)
 Inbound call from patient stating that he has been experiencing nausea and diarrhea since he has started taking bentyl  and flagyl . Patient is requesting a call to discuss and to see if he needs to go to the ER. Please advise.

## 2024-04-01 NOTE — Telephone Encounter (Signed)
 Patient advised.

## 2024-04-02 MED ORDER — METOCLOPRAMIDE HCL 5 MG/ML IJ SOLN
10.0000 mg | Freq: Once | INTRAMUSCULAR | Status: AC
  Administered 2024-04-02: 10 mg via INTRAVENOUS
  Filled 2024-04-02: qty 2

## 2024-04-02 MED ORDER — METOCLOPRAMIDE HCL 10 MG PO TABS
10.0000 mg | ORAL_TABLET | Freq: Four times a day (QID) | ORAL | 0 refills | Status: AC
Start: 2024-04-02 — End: ?

## 2024-04-02 NOTE — Discharge Instructions (Signed)
 Your workup tonight was reassuring.  Please follow-up with gastroenterology for further evaluation as needed.  I have prescribed a course of Reglan  to help with your nausea.  You may consider trying Imodium over-the-counter to help with your diarrhea.  If you develop any life-threatening symptoms please return to the emergency department.

## 2024-04-02 NOTE — ED Provider Notes (Signed)
 [place] EMERGENCY DEPARTMENT AT [facility] Provider Note   CSN: [id number] Arrival date & time: 04/01/24  2155     History  Chief Complaint  Patient presents with   Abdominal Pain    [name] is a 58 y.o. male.  Patient presents to the emerged department complaining of diarrhea, nausea, occasional emesis.  The patient states his symptoms began a few days ago.  He endorses a history of multiple abdominal surgeries and history of small bowel obstruction.  He was evaluated by gastroenterology a few days ago who believed that his symptoms were a flare of his underlying IBS with constipation and diarrhea.  They prescribed Flagyl  and believes that this may have caused the nausea.  He discontinued the Flagyl  earlier today at the recommendation of gastroenterology.  He complains of general abdominal discomfort for the past 2 days.  He states his diarrhea is "just water".  He has not been eating today due to abdominal discomfort.  Past medical history otherwise significant for GERD, IBS, recurrent diarrhea, chronic low back pain   Abdominal Pain      Home Medications Prior to Admission medications   Medication Sig Start Date End Date Taking? Authorizing Provider  metoCLOPramide  (REGLAN ) 10 MG tablet Take 1 tablet (10 mg total) by mouth every 6 (six) hours. 04/02/24  Yes Elisa Guest, PA-C  clobetasol  ointment (TEMOVATE ) 0.05 % Apply 1 Application topically 2 (two) times daily. Apply 2 times a day on hands for 1 week then STOP 03/16/24   Dellar Fenton, DO  dicyclomine  (BENTYL ) 20 MG tablet Take 1 tablet (20 mg total) by mouth every 6 (six) hours as needed for spasms (abdominal pain). 03/30/24   Kenney Peacemaker, MD  famotidine  (PEPCID ) 20 MG tablet TAKE 1 TABLET BY MOUTH EVERYDAY AT BEDTIME 02/01/24   Antonio Baumgarten, NP  hydrOXYzine (ATARAX) 25 MG tablet Take 25 mg by mouth every 8 (eight) hours as needed. 08/13/23   [provider]  JANUVIA  100 MG tablet TAKE  ONE TABLET BY MOUTH EVERY DAY 03/31/24   Colene Dauphin, MD  levocetirizine (XYZAL ) 5 MG tablet TAKE 1 TABLET (5 MG TOTAL) BY MOUTH IN THE MORNING AND AT BEDTIME 03/23/24 04/22/24  Rochester Chuck, MD  lidocaine  (HM LIDOCAINE  PATCH) 4 % Place 1 patch onto the skin daily. 01/26/23   Small, Brooke L, PA  metroNIDAZOLE  (FLAGYL ) 500 MG tablet Take 1 tablet (500 mg total) by mouth 2 (two) times daily. 03/30/24   Kenney Peacemaker, MD  OPZELURA  1.5 % CREA Apply 1 Application topically 2 (two) times daily. Apply 2 times daily to hands 03/16/24   Dellar Fenton, DO  pantoprazole  (PROTONIX ) 40 MG tablet TAKE 1 TABLET BY MOUTH EVERY DAY 11/12/23   Colene Dauphin, MD  pimecrolimus  (ELIDEL ) 1 % cream Apply topically 2 (two) times daily. 09/28/23   Rochester Chuck, MD  polyethylene glycol powder (GLYCOLAX /MIRALAX ) 17 GM/SCOOP powder Take 17 g by mouth daily. 06/04/22   Kraig Peru, MD  rosuvastatin  (CRESTOR ) 5 MG tablet Take 1 tablet (5 mg total) by mouth at bedtime. 09/16/23   Tobb, Kardie, DO  tacrolimus  (PROTOPIC ) 0.1 % ointment Apply topically 2 (two) times daily. 09/22/23   Rochester Chuck, MD  traMADol  (ULTRAM ) 50 MG tablet Take 50 mg by mouth as needed for moderate pain (pain score 4-6). 02/29/24   [provider]  triamcinolone  cream (KENALOG ) 0.1 % APPLY 1 APPLICATION TOPICALLY 2 (TWO)  TIMES DAILY AS NEEDED (RASH ON LEG). 10/07/22   Colene Dauphin, MD      Allergies    Metformin  and related    Review of Systems   Review of Systems  Gastrointestinal:  Positive for abdominal pain.    Physical Exam Updated Vital Signs BP 111/76   Pulse 95   Temp 98.1 F (36.7 C) (Oral)   Resp 19   Ht 6\' 1"  (1.854 m)   Wt 93.9 kg   SpO2 97%   BMI 27.31 kg/m  Physical Exam Vitals and nursing note reviewed.  Constitutional:      General: He is not in acute distress.    Appearance: He is well-developed.  HENT:     Head: Normocephalic and atraumatic.  Eyes:     Conjunctiva/sclera:  Conjunctivae normal.  Cardiovascular:     Rate and Rhythm: Normal rate and regular rhythm.  Pulmonary:     Effort: Pulmonary effort is normal. No respiratory distress.     Breath sounds: Normal breath sounds.  Abdominal:     Palpations: Abdomen is soft.     Tenderness: There is generalized abdominal tenderness.     Comments: Generalized abdominal discomfort with no focal tenderness  Musculoskeletal:        General: No swelling.     Cervical back: Neck supple.  Skin:    General: Skin is warm and dry.     Capillary Refill: Capillary refill takes less than 2 seconds.  Neurological:     Mental Status: He is alert.  Psychiatric:        Mood and Affect: Mood normal.     ED Results / Procedures / Treatments   Labs (all labs ordered are listed, but only abnormal results are displayed) Labs Reviewed  COMPREHENSIVE METABOLIC PANEL WITH GFR - Abnormal; Notable for the following components:      Result Value   Sodium 134 (*)    CO2 20 (*)    Glucose, Bld 140 (*)    Creatinine, Ser 1.28 (*)    Total Bilirubin 2.3 (*)    All other components within normal limits  LIPASE, BLOOD  CBC  URINALYSIS, ROUTINE W REFLEX MICROSCOPIC    EKG None  Radiology CT ABDOMEN PELVIS W CONTRAST Result Date: 04/01/2024 CLINICAL DATA:  Abdominal pain EXAM: CT ABDOMEN AND PELVIS WITH CONTRAST TECHNIQUE: Multidetector CT imaging of the abdomen and pelvis was performed using the standard protocol following bolus administration of intravenous contrast. RADIATION DOSE REDUCTION: This exam was performed according to the departmental dose-optimization program which includes automated exposure control, adjustment of the mA and/or kV according to patient size and/or use of iterative reconstruction technique. CONTRAST:  75mL OMNIPAQUE  IOHEXOL  350 MG/ML SOLN COMPARISON:  CTA abdomen/pelvis dated 01/26/2023 FINDINGS: Lower chest: Lung bases are clear. Hepatobiliary: Liver is within normal limits. Status post  cholecystectomy. No intrahepatic or extrahepatic ductal dilatation. Pancreas: Within normal limits. Spleen: Within normal limits. Adrenals/Urinary Tract: Adrenal glands are within normal limits. 10 mm simple right lower pole renal cyst (image 47), benign (Bosniak I). No hydronephrosis. Bladder is within normal limits. Stomach/Bowel: Stomach is within normal limits. No evidence of bowel obstruction. Suspected diminutive appendix is within normal limits (image 34). No colonic wall thickening or inflammatory changes. Vascular/Lymphatic: No evidence of abdominal aortic aneurysm. No suspicious abdominopelvic lymphadenopathy. Reproductive: Prostate is unremarkable. Other: No abdominopelvic ascites. Musculoskeletal: Visualized osseous structures are within normal limits. IMPRESSION: Negative CT abdomen/pelvis. Status post cholecystectomy. Electronically Signed   By: Sriyesh  Krishnan  M.D.   On: 04/01/2024 23:51    Procedures Procedures    Medications Ordered in ED Medications  morphine  (PF) 4 MG/ML injection 4 mg (4 mg Intravenous Given 04/01/24 2320)  ondansetron  (ZOFRAN ) injection 4 mg (4 mg Intravenous Given 04/01/24 2320)  sodium chloride  0.9 % bolus 1,000 mL (0 mLs Intravenous Stopped 04/02/24 0124)  iohexol  (OMNIPAQUE ) 350 MG/ML injection 75 mL (75 mLs Intravenous Contrast Given 04/01/24 2346)  metoCLOPramide  (REGLAN ) injection 10 mg (10 mg Intravenous Given 04/02/24 0120)    ED Course/ Medical Decision Making/ A&P                                 Medical Decision Making Amount and/or Complexity of Data Reviewed Labs: ordered. Radiology: ordered.  Risk Prescription drug management.   This patient presents to the ED for concern of abdominal pain, this involves an extensive number of treatment options, and is a complaint that carries with it a high risk of complications and morbidity.  The differential diagnosis includes small bowel obstruction, gastroenteritis, IBS flare, medication reaction,  others   Co morbidities that complicate the patient evaluation  IBS with constipation and diarrhea   Additional history obtained:   External records from outside source obtained and reviewed including notes from gastroenterology   Lab Tests:  I Ordered, and personally interpreted labs.  The pertinent results include: Bilirubin mildly elevated 2.3, creatinine 1.28   Imaging Studies ordered:  I ordered imaging studies including CT abdomen pelvis with contrast I independently visualized and interpreted imaging which showed no acute findings I agree with the radiologist interpretation    Problem List / ED Course / Critical interventions / Medication management   I ordered medication including morphine  for pain, Zofran  for nausea, Reglan  for abdominal discomfort Reevaluation of the patient after these medicines showed that the patient improved I have reviewed the patients home medicines and have made adjustments as needed    Test / Admission - Considered:  Patient feeling somewhat better at this time.  He is able to tolerate oral intake.  At this time I see no indication for admission or further emergent workup.  CT abdomen gross unremarkable.  Labs reassuring.  This seems to be most likely a flare of the patient's underlying IBS, currently being managed by gastroenterology.  Plan to discharge at this time with recommendations for further follow-up with GI.  Will prescribe short course of Reglan  to help with nausea.  Return precautions provided. '        Final Clinical Impression(s) / ED Diagnoses Final diagnoses:  Generalized abdominal pain  Nausea vomiting and diarrhea    Rx / DC Orders ED Discharge Orders          Ordered    metoCLOPramide  (REGLAN ) 10 MG tablet  Every 6 hours        04/02/24 0255              Elisa Guest, PA-C 04/02/24 0255    Kelsey Patricia, MD 04/02/24 [phone]

## 2024-04-02 NOTE — ED Notes (Signed)
 Pt tolerating PO fluids

## 2024-04-05 NOTE — Telephone Encounter (Signed)
 Called the patient to check on his symptoms. No answer. Left message of my call and asked for a return call.

## 2024-04-05 NOTE — Telephone Encounter (Signed)
 CT scan was very reassuring No major abnormalities on his labs though suspect dehydration so force fluids Continue reglan  and try to advance diet with time  See if he can see Santina Cull tomorrow - she has an opening

## 2024-04-05 NOTE — Telephone Encounter (Signed)
 Patient was in the ED 04/01/24. Today he tells me he still is not feeling well. Lots of burping, upper abdominal discomfort. The vomiting and diarrhea have stopped. He remains on liquids only by his choice. Afraid to eat. Feels weak and tired.  ED gave him fluids and sent him home with Reglan . He is taking the Reglan .

## 2024-04-05 NOTE — Telephone Encounter (Signed)
 Patient advised of recommendations. He wants to wait on coming in to be seen. Declines the offer of an appointment. He does not have any transportation either. Agrees to continue to force fluids. Patient says his wife's soup has made him feel better. Talked briefly about advancing diet slowly. Take medication as prescribed.  Check on patient in 24 hours. Patient agrees to this plan.

## 2024-04-06 ENCOUNTER — Ambulatory Visit: Admitting: Physician Assistant

## 2024-04-06 NOTE — Telephone Encounter (Signed)
 Spoke with the patient. He is continuing to have improvements. No vomiting. He continues to take Reglan . He is advancing his diet to include soft cooked vegetables. Drinking peppermint tea and pushing fluids. Complains of dry mouth. Question him if he could be consuming more fluids. He says maybe. Patient has not had any bowel movements in 2 days. He is having difficulty sleeping at night. He finds he burps and belches a lot and "just cannot go to sleep." He then starts falling asleep in the very early morning. Encouraged him to discuss his sleep with his PCP. Encouraged him to get up and move around during the day to try avoiding daytime napping.

## 2024-04-07 NOTE — Telephone Encounter (Signed)
 Patient is tolerating foods as he has advanced his diet. No spicy or greasy foods. Pushing fluids. Feeling better. He has developed diarrhea. No nausea today. Suggested stopping metoclopramide  if he is no longer nauseated. He can take OTC Imodium by package directions. Reports 3 diarrhea stools today. Still not sleeping well. Afebrile. No abdominal pain.

## 2024-04-07 NOTE — Telephone Encounter (Signed)
 No answer. Did not leave a message. Will try again today to reach the patient.

## 2024-04-19 NOTE — Telephone Encounter (Signed)
 Patient requesting a call back in regards to previous note. Please advise.   Thank you

## 2024-04-21 NOTE — Telephone Encounter (Signed)
 Inbound call from patient, would like to speak to a nurse in regards to conversation with her last week.

## 2024-04-28 ENCOUNTER — Other Ambulatory Visit: Payer: Self-pay | Admitting: Primary Care

## 2024-05-18 DIAGNOSIS — K080 Exfoliation of teeth due to systemic causes: Secondary | ICD-10-CM | POA: Diagnosis not present

## 2024-05-26 ENCOUNTER — Other Ambulatory Visit: Payer: Self-pay | Admitting: Internal Medicine

## 2024-06-15 ENCOUNTER — Encounter: Payer: Self-pay | Admitting: Dermatology

## 2024-06-15 ENCOUNTER — Telehealth: Payer: Self-pay | Admitting: *Deleted

## 2024-06-15 ENCOUNTER — Ambulatory Visit: Admitting: Dermatology

## 2024-06-15 ENCOUNTER — Other Ambulatory Visit: Payer: Self-pay | Admitting: Pharmacy Technician

## 2024-06-15 ENCOUNTER — Other Ambulatory Visit: Payer: Self-pay

## 2024-06-15 VITALS — BP 118/70 | HR 90

## 2024-06-15 DIAGNOSIS — L309 Dermatitis, unspecified: Secondary | ICD-10-CM

## 2024-06-15 DIAGNOSIS — L299 Pruritus, unspecified: Secondary | ICD-10-CM | POA: Diagnosis not present

## 2024-06-15 MED ORDER — CLOBETASOL PROPIONATE 0.05 % EX OINT: 1.0000 | TOPICAL_OINTMENT | Freq: Two times a day (BID) | CUTANEOUS | 5 refills | Status: AC

## 2024-06-15 MED ORDER — DUPIXENT 300 MG/2ML ~~LOC~~ SOSY
600.0000 mg | PREFILLED_SYRINGE | Freq: Once | SUBCUTANEOUS | 11 refills | Status: DC
  Filled 2024-06-15: qty 4, 1d supply, fill #0

## 2024-06-15 MED ORDER — TACROLIMUS 0.1 % EX OINT: TOPICAL_OINTMENT | Freq: Two times a day (BID) | CUTANEOUS | 3 refills | Status: AC

## 2024-06-15 NOTE — Telephone Encounter (Signed)
 Patient called and no wants to start Dupixent . I advised him will run through pharmacy and find out his copay then reach out to him if he needs to contact [facility] per Dupixent  [employer] Way for LIS (extra help)

## 2024-06-15 NOTE — Progress Notes (Signed)
 Follow-Up Visit   Subjective  [name] is a 58 y.o. male who presents for the following: Here to follow up on treatment for hand dermatitis.  Patient stated he tried the clobetasol  and opzulera given at last visit and states it didn't really help.  He said he has tried lots of different OTC lotions with no help either. He states his legs are really bad today and itch all night and prevents him from sleeping.  The following portions of the chart were reviewed this encounter and updated as appropriate: medications, allergies, medical history  Review of Systems:  No other skin or systemic complaints except as noted in HPI or Assessment and Plan.  Objective  Well appearing patient in no apparent distress; mood and affect are within normal limits.    A focused examination was performed of the following areas: Hands and legs  Relevant exam findings are noted in the Assessment and Plan.    Assessment & Plan    HAND DERMATITIS Exam Scaly pink plaques +/- fissures  flared  Hand Dermatitis is a chronic type of eczema that can come and go on the hands and fingers.  While there is no cure, the rash and symptoms can be managed with topical prescription medications, and for more severe cases, with systemic medications.  Recommend mild soap and routine use of moisturizing cream after handwashing.  Minimize soap/water exposure when possible.    Treatment Plan -Continue using clobetasol  on for 2 weeks at a time. -Continue to use Opzelura  on the off weeks. -Continue to use moisturize with vasaline/Aquaphor on top. -Use gloves when ever hands will be submerged in water.  Recommend mild soap and moisturizing cream with hand washing.    - Assessment: Chronic hand eczema, previously treated with clobetasol  and opsulora. Hands show improvement in redness but persistent dryness. Patient reports skin peeling and occasional bleeding due to scratching. Condition exacerbated by contact  irritants and frequent hand washing. - Plan:    Continue clobetasol  cream for hands: apply twice daily for 2 weeks, then discontinue for 2 weeks, repeating this cycle    Implement daily moisturizing routine:     1. Apply prescription medication (when on active treatment)     2. Apply over-the-counter moisturizer     3. Apply Aquaphor or Vaseline as an occlusive layer    Wear gloves for dish washing and cleaning activities    Apply clobetasol  cream to any areas of redness or itching    Follow up in 2-4 weeks to assess improvement  2. Pruritus - Assessment: Annual onset of severe pruritus on the legs beginning in June, consistent with a seasonal pattern. Symptoms include intense itching, scratching leading to bleeding, and sleep disturbance. Previous treatments from other dermatologists have been ineffective. Condition appears consistent with eczema, similar to hand involvement. - Plan:    Apply clobetasol  cream to affected areas on legs when itching is present    Consider oral antihistamines (e.g., Benadryl ) for symptomatic relief of itching    Identify and avoid potential triggers    Follow up to assess response to treatment and consider additional interventions if needed  Follow-up in 2-4 weeks for hand eczema, and as needed for leg pruritus to assess treatment efficacy.    No follow-ups on file.  IBerwyn Lesches, Surg Tech III, am acting as scribe for [employer], DO.   Documentation: I have reviewed the above documentation for accuracy and completeness, and I agree with the above.  Delon Lenis,  DO

## 2024-06-15 NOTE — Patient Instructions (Signed)
 Date: Wed Jun 15 2024  Hello [name],  Thank you for visiting today. Here is a summary of the key instructions:  - Medicine Application:   - Apply clobetasol  to hands and legs when itchy or red   - Use medicine twice a day for 2 weeks, then stop for 2 weeks   - After medicine, apply moisturizer (like Eucerin)   - Put Aquaphor or Vaseline on top of moisturizer   - If your skin is still itchy, start using Tacrolimus  ointment twice a day for 2 weeks, Keep alternating clobetasol  for 2 weeks and tacrolimus  for two weeks until the itch stops   - Do this every night, even when not using medicine  - For better long term management we are prescribing a biologic called Dupixent .  We will call you once it's approved and will schedule injection training.   - Hand Protection:   - Wear gloves when washing dishes or cleaning  - Skin Management:   - Avoid things that make your skin itchy or dry   - Use clobetasol  cream on itchy areas of legs  - Follow-up:   - Continue using clobetasol  as directed: 2 weeks on, 2 weeks off  Please reach out if you have any questions or concerns.  Best Regards,  Dr. Delon Lenis [facility]   Important Information  Due to recent changes in healthcare laws, you may see results of your pathology and/or laboratory studies on MyChart before the doctors have had a chance to review them. We understand that in some cases there may be results that are confusing or concerning to you. Please understand that not all results are received at the same time and often the doctors may need to interpret multiple results in order to provide you with the best plan of care or course of treatment. Therefore, we ask that you please give us  2 business days to thoroughly review all your results before contacting the office for clarification. Should we see a critical lab result, you will be contacted sooner.   If You Need Anything After Your Visit  If you have any questions or concerns  for your doctor, please call our main line at [phone] If no one answers, please leave a voicemail as directed and we will return your call as soon as possible. Messages left after 4 pm will be answered the following business day.   You may also send us  a message via MyChart. We typically respond to MyChart messages within 1-2 business days.  For prescription refills, please ask your pharmacy to contact our office. Our fax number is [phone].  If you have an urgent issue when the clinic is closed that cannot wait until the next business day, you can page your doctor at the number below.    Please note that while we do our best to be available for urgent issues outside of office hours, we are not available 24/7.   If you have an urgent issue and are unable to reach us , you may choose to seek medical care at your doctor's office, retail clinic, urgent care center, or emergency room.  If you have a medical emergency, please immediately call 911 or go to the emergency department. In the event of inclement weather, please call our main line at [phone] for an update on the status of any delays or closures.  Dermatology Medication Tips: Please keep the boxes that topical medications come in in order to help keep track of the instructions about where  and how to use these. Pharmacies typically print the medication instructions only on the boxes and not directly on the medication tubes.   If your medication is too expensive, please contact our office at [phone] or send us  a message through MyChart.   We are unable to tell what your co-pay for medications will be in advance as this is different depending on your insurance coverage. However, we may be able to find a substitute medication at lower cost or fill out paperwork to get insurance to cover a needed medication.   If a prior authorization is required to get your medication covered by your insurance company, please allow us  1-2  business days to complete this process.  Drug prices often vary depending on where the prescription is filled and some pharmacies may offer cheaper prices.  The website www.goodrx.com contains coupons for medications through different pharmacies. The prices here do not account for what the cost may be with help from insurance (it may be cheaper with your insurance), but the website can give you the price if you did not use any insurance.  - You can print the associated coupon and take it with your prescription to the pharmacy.  - You may also stop by our office during regular business hours and pick up a GoodRx coupon card.  - If you need your prescription sent electronically to a different pharmacy, notify our office through [facility] or by phone at [phone]

## 2024-06-16 ENCOUNTER — Other Ambulatory Visit: Payer: Self-pay | Admitting: Internal Medicine

## 2024-06-16 ENCOUNTER — Other Ambulatory Visit (HOSPITAL_COMMUNITY): Payer: Self-pay

## 2024-06-16 ENCOUNTER — Telehealth: Payer: Self-pay

## 2024-06-16 ENCOUNTER — Other Ambulatory Visit: Payer: Self-pay

## 2024-06-16 NOTE — Telephone Encounter (Signed)
 L/m for patient his copay is $1200 foDupixent  and will need to go through Dupixent  PAP. They are requiring patient to apply for LIS extra help due to his income after he does same if denied can send to PAP and they will approve him for free drug. If approved then we can process through Ins at affordable copay

## 2024-06-16 NOTE — Progress Notes (Signed)
 Tammy is applying for PAP (Patient Assistance Program) as the patient's copay was unaffordable. The patient will be disenrolled from Specialty Pharmacy services.

## 2024-06-16 NOTE — Telephone Encounter (Signed)
 Test claim for [facility]

## 2024-07-06 ENCOUNTER — Ambulatory Visit: Admitting: Primary Care

## 2024-07-20 ENCOUNTER — Other Ambulatory Visit: Payer: Self-pay | Admitting: Allergy & Immunology

## 2024-08-15 ENCOUNTER — Ambulatory Visit (INDEPENDENT_AMBULATORY_CARE_PROVIDER_SITE_OTHER): Payer: Medicare Other

## 2024-08-15 VITALS — Ht 72.0 in | Wt 207.0 lb

## 2024-08-15 DIAGNOSIS — Z0000 Encounter for general adult medical examination without abnormal findings: Secondary | ICD-10-CM

## 2024-08-15 NOTE — Progress Notes (Signed)
 Subjective:   [name] is a 58 y.o. who presents for a Medicare Wellness preventive visit.  As a reminder, Annual Wellness Visits don't include a physical exam, and some assessments may be limited, especially if this visit is performed virtually. We may recommend an in-person follow-up visit with your provider if needed.  Visit Complete: Virtual I connected with  [name] on 08/15/24 by a audio enabled telemedicine application and verified that I am speaking with the correct person using two identifiers.  Patient Location: Home  Provider Location: Office/Clinic  I discussed the limitations of evaluation and management by telemedicine. The patient expressed understanding and agreed to proceed.  Vital Signs: Because this visit was a virtual/telehealth visit, some criteria may be missing or patient reported. Any vitals not documented were not able to be obtained and vitals that have been documented are patient reported.  VideoDeclined- This patient declined Librarian, academic. Therefore the visit was completed with audio only.  Persons Participating in Visit: Patient.  AWV Questionnaire: No: Patient Medicare AWV questionnaire was not completed prior to this visit.  Cardiac Risk Factors include: advanced age (>92men, >67 women);diabetes mellitus;dyslipidemia;male gender     Objective:    Today's Vitals   08/15/24 1355  Weight: 207 lb (93.9 kg)  Height: 6' (1.829 m)   Body mass index is 28.07 kg/m.     08/15/2024    1:55 PM 04/01/2024   10:27 PM 08/12/2023    2:53 PM 01/26/2023    4:47 PM 05/31/2022   12:16 PM 02/03/2022   10:51 PM 04/09/2018    8:26 PM  Advanced Directives  Does Patient Have a Medical Advance Directive? No No No No No No No   Would patient like information on creating a medical advance directive? No - Patient declined  No - Patient declined  No - Patient declined No - Patient declined No - Patient declined      Data saved  with a previous flowsheet row definition    Current Medications (verified) Outpatient Encounter Medications as of 08/15/2024  Medication Sig   clobetasol  ointment (TEMOVATE ) 0.05 % Apply 1 Application topically 2 (two) times daily. Apply 2 times a day on hands for 1 week then STOP   dicyclomine  (BENTYL ) 20 MG tablet Take 1 tablet (20 mg total) by mouth every 6 (six) hours as needed for spasms (abdominal pain).   famotidine  (PEPCID ) 20 MG tablet TAKE 1 TABLET BY MOUTH EVERYDAY AT BEDTIME   hydrOXYzine (ATARAX) 25 MG tablet Take 25 mg by mouth every 8 (eight) hours as needed.   JANUVIA  100 MG tablet TAKE ONE TABLET BY MOUTH EVERY DAY   levocetirizine (XYZAL ) 5 MG tablet TAKE 1 TABLET (5 MG TOTAL) BY MOUTH IN THE MORNING AND AT BEDTIME   lidocaine  (HM LIDOCAINE  PATCH) 4 % Place 1 patch onto the skin daily.   metoCLOPramide  (REGLAN ) 10 MG tablet Take 1 tablet (10 mg total) by mouth every 6 (six) hours.   OPZELURA  1.5 % CREA Apply 1 Application topically 2 (two) times daily. Apply 2 times daily to hands   pantoprazole  (PROTONIX ) 40 MG tablet TAKE 1 TABLET BY MOUTH EVERY DAY   pimecrolimus  (ELIDEL ) 1 % cream Apply topically 2 (two) times daily.   polyethylene glycol powder (GLYCOLAX /MIRALAX ) 17 GM/SCOOP powder Take 17 g by mouth daily.   rosuvastatin  (CRESTOR ) 5 MG tablet Take 1 tablet (5 mg total) by mouth at bedtime.   tacrolimus  (PROTOPIC ) 0.1 % ointment Apply  topically 2 (two) times daily.   traMADol  (ULTRAM ) 50 MG tablet Take 50 mg by mouth as needed for moderate pain (pain score 4-6).   triamcinolone  cream (KENALOG ) 0.1 % APPLY 1 APPLICATION TOPICALLY 2 (TWO) TIMES DAILY AS NEEDED (RASH ON LEG).   No facility-administered encounter medications on file as of 08/15/2024.    Allergies (verified) Metformin  and related   History: Past Medical History:  Diagnosis Date   Allergy     Chest wall pain    Cough    Diabetes (HCC)    Diarrhea recurrent   GERD (gastroesophageal reflux disease)     Headache(784.0)    Irritable bowel syndrome    Obstructive sleep apnea    apnealink 10/23/10 AHI 5, Sp02 low 87%   Postural lightheadedness    SBO (small bowel obstruction) (HCC) 12/2015   Sleep apnea    11/27/21 will get new test   Past Surgical History:  Procedure Laterality Date   CHOLECYSTECTOMY  2009   COLECTOMY  1994   segmental   COLONOSCOPY  07/17/2011   hemorrhoids, otherwise normal including terminal ileum and random colon biopises   GASTROSTOMY  2005   stamm   LAPAROTOMY  2002, 2005, 2009   SPHINCTEROTOMY  2009   lateral internal   UPPER GASTROINTESTINAL ENDOSCOPY  07/17/2011   normal, including duodenal biopsies   Family History  Problem Relation Age of Onset   Hypertension Mother    Liver disease Father        liver cancer???   Colon cancer Neg Hx    Esophageal cancer Neg Hx    Rectal cancer Neg Hx    Stomach cancer Neg Hx    Social History   Socioeconomic History   Marital status: Married    Spouse name: Not on file   Number of children: 4   Years of education: Not on file   Highest education level: Associate degree: academic program  Occupational History   Occupation: FORK LIFT DRIVER    Employer: [employer]    Comment: 11/27/21 out of work  Tobacco Use   Smoking status: Never    Passive exposure: Never   Smokeless tobacco: Never  Vaping Use   Vaping status: Never Used  Substance and Sexual Activity   Alcohol use: Never   Drug use: Never   Sexual activity: Yes  Other Topics Concern   Not on file  Social History Narrative   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   Alcohol Use-no   Illicit Drug Use-no   Married-Divorced-Married again 2009-    4 children 2 sons and 2 daughters   [place] heritage   No regular exercise   Social Drivers of Corporate investment banker Strain: Low Risk  (08/15/2024)   Overall Financial Resource Strain (CARDIA)    Difficulty of Paying Living Expenses:  Not hard at all  Food Insecurity: No Food Insecurity (08/15/2024)   Hunger Vital Sign    Worried About Running Out of Food in the Last Year: Never true    Ran Out of Food in the Last Year: Never true  Transportation Needs: No Transportation Needs (08/15/2024)   PRAPARE - Administrator, Civil Service (Medical): No    Lack of Transportation (Non-Medical): No  Physical Activity: Inactive (08/15/2024)   Exercise Vital Sign    Days of Exercise per Week: 0 days    Minutes of Exercise per Session: 0 min  Stress: No Stress Concern  Present (08/15/2024)   [employer] of Occupational Health - Occupational Stress Questionnaire    Feeling of Stress: Not at all  Social Connections: Socially Isolated (08/15/2024)   Social Connection and Isolation Panel    Frequency of Communication with Friends and Family: Once a week    Frequency of Social Gatherings with Friends and Family: Never    Attends Religious Services: Never    Diplomatic Services operational officer: No    Attends Engineer, structural: Never    Marital Status: Married    Tobacco Counseling Counseling given: Not Answered    Clinical Intake:  Pre-visit preparation completed: Yes  Pain : No/denies pain     BMI - recorded: 28.07 Nutritional Status: BMI 25 -29 Overweight Nutritional Risks: None Diabetes: Yes CBG done?: No Did pt. bring in CBG monitor from home?: No  Lab Results  Component Value Date   HGBA1C 6.6 (H) 02/16/2024   HGBA1C 6.8 (H) 08/18/2023   HGBA1C 6.6 (H) 04/23/2023     How often do you need to have someone help you when you read instructions, pamphlets, or other written materials from your doctor or pharmacy?: 4 - Often (Spouse helps)  Interpreter Needed?: No  Information entered by :: Verdie Saba, CMA   Activities of Daily Living     08/15/2024    1:58 PM  In your present state of health, do you have any difficulty performing the following activities:  Hearing? 0  Vision? 0   Difficulty concentrating or making decisions? 0  Walking or climbing stairs? 0  Dressing or bathing? 0  Doing errands, shopping? 0  Preparing Food and eating ? N  Using the Toilet? N  In the past six months, have you accidently leaked urine? N  Do you have problems with loss of bowel control? N  Managing your Medications? Y  Comment Spouse helps  Managing your Finances? Y  Comment Spouse helps  Housekeeping or managing your Housekeeping? Y  Comment Spouse helps    Patient Care Team: Geofm Glade PARAS, MD as PCP - General (Internal Medicine) Tobb, Kardie, DO as PCP - Cardiology (Cardiology) Avram Lupita BRAVO, MD as Consulting Physician (Gastroenterology) Abigail Maude POUR as Consulting Physician (Optometry)  I have updated your Care Teams any recent Medical Services you may have received from other providers in the past year.     Assessment:   This is a routine wellness examination for [name].  Hearing/Vision screen Hearing Screening - Comments:: Denies hearing difficulties   Vision Screening - Comments:: Wears rx glasses - appt in 10/2024 w/Peter Dunn   Goals Addressed               This Visit's Progress     Patient Stated (pt-stated)        Patient stated he needs to exercise more and manage the ezema on legs       Depression Screen     08/15/2024    2:00 PM 02/16/2024    1:23 PM 08/18/2023    1:30 PM 08/12/2023    3:04 PM 06/23/2023    2:18 PM 08/04/2022    2:34 PM 02/04/2022    1:46 PM  PHQ 2/9 Scores  PHQ - 2 Score 0 0 0 0 0 0 0  PHQ- 9 Score 2  4 6 6 4      Fall Risk     08/15/2024    1:59 PM 02/16/2024    1:22 PM 02/02/2024    3:39 PM  08/18/2023    1:30 PM 08/12/2023    3:06 PM  Fall Risk   Falls in the past year? 0 0 0 1 1  Number falls in past yr: 0 0 0 0 0  Injury with Fall? 0 0 0 0 0  Risk for fall due to : No Fall Risks No Fall Risks  Other (Comment)   Follow up Falls evaluation completed;Falls prevention discussed Falls evaluation completed Falls  evaluation completed Falls evaluation completed Education provided;Falls prevention discussed;Falls evaluation completed    MEDICARE RISK AT HOME:  Medicare Risk at Home Any stairs in or around the home?: Yes If so, are there any without handrails?: No Home free of loose throw rugs in walkways, pet beds, electrical cords, etc?: Yes Adequate lighting in your home to reduce risk of falls?: Yes Life alert?: No Use of a cane, walker or w/c?: No Grab bars in the bathroom?: Yes Shower chair or bench in shower?: Yes Elevated toilet seat or a handicapped toilet?: No  TIMED UP AND GO:  Was the test performed?  No  Cognitive Function: 6CIT completed        08/15/2024    2:02 PM 08/12/2023    3:08 PM  6CIT Screen  What Year? 0 points 0 points  What month? 0 points 0 points  What time? 0 points 0 points  Count back from 20 0 points 0 points  Months in reverse 0 points 0 points  Repeat phrase 0 points 0 points  Total Score 0 points 0 points    Immunizations Immunization History  Administered Date(s) Administered   Influenza Inj Mdck Quad Pf 08/25/2018, 08/29/2021   Influenza Split 08/10/2017   Influenza Whole 09/07/2010   Influenza, Seasonal, Injecte, Preservative Fre 08/18/2023   Influenza,inj,Quad PF,6+ Mos 08/18/2017, 08/29/2019, 08/29/2020, 08/04/2022   Influenza-Unspecified 08/20/2016   Pneumococcal Polysaccharide-23 12/04/2016   Tdap 04/17/2011, 08/04/2022   Zoster Recombinant(Shingrix ) 10/06/2018    Screening Tests Health Maintenance  Topic Date Due   Hepatitis B Vaccines 19-59 Average Risk (1 of 3 - 19+ 3-dose series) Never done   Pneumococcal Vaccine: 50+ Years (2 of 2 - PCV) 12/04/2017   OPHTHALMOLOGY EXAM  11/28/2022   Influenza Vaccine  07/08/2024   COVID-19 Vaccine (1 - 2024-25 season) Never done   FOOT EXAM  08/17/2024   HEMOGLOBIN A1C  08/18/2024   Diabetic kidney evaluation - Urine ACR  02/15/2025   Diabetic kidney evaluation - eGFR measurement  04/01/2025    Medicare Annual Wellness (AWV)  08/15/2025   Colonoscopy  10/28/2028   DTaP/Tdap/Td (3 - Td or Tdap) 08/04/2032   Hepatitis C Screening  Completed   HIV Screening  Completed   HPV VACCINES  Aged Out   Meningococcal B Vaccine  Aged Out   Zoster Vaccines- Shingrix   Discontinued    Health Maintenance Items Addressed: 08/15/2024  Additional Screening:  Vision Screening: Recommended annual ophthalmology exams for early detection of glaucoma and other disorders of the eye. Is the patient up to date with their annual eye exam?  No  Who is the provider or what is the name of the office in which the patient attends annual eye exams? [facility]  Dental Screening: Recommended annual dental exams for proper oral hygiene  Community Resource Referral / Chronic Care Management: CRR required this visit?  No   CCM required this visit?  No   Plan:    I have personally reviewed and noted the following in the patient's chart:   Medical  and social history Use of alcohol, tobacco or illicit drugs  Current medications and supplements including opioid prescriptions. Patient is currently taking opioid prescriptions. Information provided to patient regarding non-opioid alternatives. Patient advised to discuss non-opioid treatment plan with their provider. Functional ability and status Nutritional status Physical activity Advanced directives List of other physicians Hospitalizations, surgeries, and ER visits in previous 12 months Vitals Screenings to include cognitive, depression, and falls Referrals and appointments  In addition, I have reviewed and discussed with patient certain preventive protocols, quality metrics, and best practice recommendations. A written personalized care plan for preventive services as well as general preventive health recommendations were provided to patient.   Verdie CHRISTELLA Saba, CMA   08/15/2024   After Visit Summary: (MyChart) Due to this being a telephonic visit, the  after visit summary with patients personalized plan was offered to patient via MyChart   Notes: Nothing significant to report at this time.

## 2024-08-15 NOTE — Patient Instructions (Addendum)
 [name],  Thank you for taking the time for your Medicare Wellness Visit. I appreciate your continued commitment to your health goals. Please review the care plan we discussed, and feel free to reach out if I can assist you further.  Medicare recommends these wellness visits once per year to help you and your care team stay ahead of potential health issues. These visits are designed to focus on prevention, allowing your provider to concentrate on managing your acute and chronic conditions during your regular appointments.  Please note that Annual Wellness Visits do not include a physical exam. Some assessments may be limited, especially if the visit was conducted virtually. If needed, we may recommend a separate in-person follow-up with your provider.  Ongoing Care Seeing your primary care provider every 3 to 6 months helps us  monitor your health and provide consistent, personalized care.   Referrals If a referral was made during today's visit and you haven't received any updates within two weeks, please contact the referred provider directly to check on the status.  Recommended Screenings:  Health Maintenance  Topic Date Due   Hepatitis B Vaccine (1 of 3 - 19+ 3-dose series) Never done   Pneumococcal Vaccine for age over 62 (2 of 2 - PCV) 12/04/2017   Eye exam for diabetics  11/28/2022   Flu Shot  07/08/2024   COVID-19 Vaccine (1 - 2024-25 season) Never done   Complete foot exam   08/17/2024   Hemoglobin A1C  08/18/2024   Yearly kidney health urinalysis for diabetes  02/15/2025   Yearly kidney function blood test for diabetes  04/01/2025   Medicare Annual Wellness Visit  08/15/2025   Colon Cancer Screening  10/28/2028   DTaP/Tdap/Td vaccine (3 - Td or Tdap) 08/04/2032   Hepatitis C Screening  Completed   HIV Screening  Completed   HPV Vaccine  Aged Out   Meningitis B Vaccine  Aged Out   Zoster (Shingles) Vaccine  Discontinued       08/15/2024    1:55 PM  Advanced Directives   Does Patient Have a Medical Advance Directive? No  Would patient like information on creating a medical advance directive? No - Patient declined   Advance Care Planning is important because it: Ensures you receive medical care that aligns with your values, goals, and preferences. Provides guidance to your family and loved ones, reducing the emotional burden of decision-making during critical moments.  Vision: Annual vision screenings are recommended for early detection of glaucoma, cataracts, and diabetic retinopathy. These exams can also reveal signs of chronic conditions such as diabetes and high blood pressure.  Dental: Annual dental screenings help detect early signs of oral cancer, gum disease, and other conditions linked to overall health, including heart disease and diabetes.

## 2024-08-21 NOTE — Patient Instructions (Addendum)
 Flu and pneumonia vaccines given today.    Blood work was ordered.       Medications changes include :   None     Return in about 6 months (around 02/19/2025) for follow up.    Health Maintenance, Male Adopting a healthy lifestyle and getting preventive care are important in promoting health and wellness. Ask your health care provider about: The right schedule for you to have regular tests and exams. Things you can do on your own to prevent diseases and keep yourself healthy. What should I know about diet, weight, and exercise? Eat a healthy diet  Eat a diet that includes plenty of vegetables, fruits, low-fat dairy products, and lean protein. Do not eat a lot of foods that are high in solid fats, added sugars, or sodium. Maintain a healthy weight Body mass index (BMI) is a measurement that can be used to identify possible weight problems. It estimates body fat based on height and weight. Your health care provider can help determine your BMI and help you achieve or maintain a healthy weight. Get regular exercise Get regular exercise. This is one of the most important things you can do for your health. Most adults should: Exercise for at least 150 minutes each week. The exercise should increase your heart rate and make you sweat (moderate-intensity exercise). Do strengthening exercises at least twice a week. This is in addition to the moderate-intensity exercise. Spend less time sitting. Even light physical activity can be beneficial. Watch cholesterol and blood lipids Have your blood tested for lipids and cholesterol at 57 years of age, then have this test every 5 years. You may need to have your cholesterol levels checked more often if: Your lipid or cholesterol levels are high. You are older than 56 years of age. You are at high risk for heart disease. What should I know about cancer screening? Many types of cancers can be detected early and may often be prevented.  Depending on your health history and family history, you may need to have cancer screening at various ages. This may include screening for: Colorectal cancer. Prostate cancer. Skin cancer. Lung cancer. What should I know about heart disease, diabetes, and high blood pressure? Blood pressure and heart disease High blood pressure causes heart disease and increases the risk of stroke. This is more likely to develop in people who have high blood pressure readings or are overweight. Talk with your health care provider about your target blood pressure readings. Have your blood pressure checked: Every 3-5 years if you are 65-17 years of age. Every year if you are 54 years old or older. If you are between the ages of 60 and 9 and are a current or former smoker, ask your health care provider if you should have a one-time screening for abdominal aortic aneurysm (AAA). Diabetes Have regular diabetes screenings. This checks your fasting blood sugar level. Have the screening done: Once every three years after age 12 if you are at a normal weight and have a low risk for diabetes. More often and at a younger age if you are overweight or have a high risk for diabetes. What should I know about preventing infection? Hepatitis B If you have a higher risk for hepatitis B, you should be screened for this virus. Talk with your health care provider to find out if you are at risk for hepatitis B infection. Hepatitis C Blood testing is recommended for: Everyone born from [birth year] through 1963. Anyone with  known risk factors for hepatitis C. Sexually transmitted infections (STIs) You should be screened each year for STIs, including gonorrhea and chlamydia, if: You are sexually active and are younger than 56 years of age. You are older than 56 years of age and your health care provider tells you that you are at risk for this type of infection. Your sexual activity has changed since you were last screened, and you are  at increased risk for chlamydia or gonorrhea. Ask your health care provider if you are at risk. Ask your health care provider about whether you are at high risk for HIV. Your health care provider may recommend a prescription medicine to help prevent HIV infection. If you choose to take medicine to prevent HIV, you should first get tested for HIV. You should then be tested every 3 months for as long as you are taking the medicine. Follow these instructions at home: Alcohol use Do not drink alcohol if your health care provider tells you not to drink. If you drink alcohol: Limit how much you have to 0-2 drinks a day. Know how much alcohol is in your drink. In the [place] one drink equals one 12 oz bottle of beer (355 mL), one 5 oz glass of wine (148 mL), or one 1 oz glass of hard liquor (44 mL). Lifestyle Do not use any products that contain nicotine or tobacco. These products include cigarettes, chewing tobacco, and vaping devices, such as e-cigarettes. If you need help quitting, ask your health care provider. Do not use street drugs. Do not share needles. Ask your health care provider for help if you need support or information about quitting drugs. General instructions Schedule regular health, dental, and eye exams. Stay current with your vaccines. Tell your health care provider if: You often feel depressed. You have ever been abused or do not feel safe at home. Summary Adopting a healthy lifestyle and getting preventive care are important in promoting health and wellness. Follow your health care provider's instructions about healthy diet, exercising, and getting tested or screened for diseases. Follow your health care provider's instructions on monitoring your cholesterol and blood pressure. This information is not intended to replace advice given to you by your health care provider. Make sure you discuss any questions you have with your health care provider. Document Revised: 04/15/2021  Document Reviewed: 04/15/2021 Elsevier Patient Education  2024 [employer].

## 2024-08-21 NOTE — Assessment & Plan Note (Addendum)
 Chronic Associated with hyperlipidemia  Lab Results  Component Value Date   HGBA1C 6.6 (H) 02/16/2024   Sugars controlled Check A1c, lipid panel, CMP, CBC Continue Januvia  100 mg daily-has been effective and well-tolerated Stressed regular exercise, diabetic diet

## 2024-08-21 NOTE — Progress Notes (Unsigned)
 Subjective:    Patient ID: [name], male    DOB: 1966-01-26, 58 y.o.   MRN: [id number]     HPI [name] is here for a physical exam and his chronic medical problems.  Eczema is bad - itches all night.      Medications and allergies reviewed with patient and updated if appropriate.  Current Outpatient Medications on File Prior to Visit  Medication Sig Dispense Refill   clobetasol  ointment (TEMOVATE ) 0.05 % Apply 1 Application topically 2 (two) times daily. Apply 2 times a day on hands for 1 week then STOP 60 g 5   dicyclomine  (BENTYL ) 20 MG tablet Take 1 tablet (20 mg total) by mouth every 6 (six) hours as needed for spasms (abdominal pain). 90 tablet 1   famotidine  (PEPCID ) 20 MG tablet TAKE 1 TABLET BY MOUTH EVERYDAY AT BEDTIME 90 tablet 1   hydrOXYzine (ATARAX) 25 MG tablet Take 25 mg by mouth every 8 (eight) hours as needed.     JANUVIA  100 MG tablet TAKE ONE TABLET BY MOUTH EVERY DAY 90 tablet 2   levocetirizine (XYZAL ) 5 MG tablet TAKE 1 TABLET (5 MG TOTAL) BY MOUTH IN THE MORNING AND AT BEDTIME 60 tablet 0   lidocaine  (HM LIDOCAINE  PATCH) 4 % Place 1 patch onto the skin daily. 30 patch 0   metoCLOPramide  (REGLAN ) 10 MG tablet Take 1 tablet (10 mg total) by mouth every 6 (six) hours. 30 tablet 0   OPZELURA  1.5 % CREA Apply 1 Application topically 2 (two) times daily. Apply 2 times daily to hands 60 g 6   pantoprazole  (PROTONIX ) 40 MG tablet TAKE 1 TABLET BY MOUTH EVERY DAY 90 tablet 1   pimecrolimus  (ELIDEL ) 1 % cream Apply topically 2 (two) times daily. 30 g 0   polyethylene glycol powder (GLYCOLAX /MIRALAX ) 17 GM/SCOOP powder Take 17 g by mouth daily. 238 g 0   rosuvastatin  (CRESTOR ) 5 MG tablet Take 1 tablet (5 mg total) by mouth at bedtime. 90 tablet 3   tacrolimus  (PROTOPIC ) 0.1 % ointment Apply topically 2 (two) times daily. 100 g 3   No current facility-administered medications on file prior to visit.    Review of Systems  Constitutional:  Negative for  fever.  Eyes:  Negative for visual disturbance.  Respiratory:  Negative for cough, shortness of breath and wheezing.   Cardiovascular:  Negative for chest pain, palpitations and leg swelling.  Gastrointestinal:  Positive for constipation (miralax  daily). Negative for abdominal pain, blood in stool and diarrhea.       Occ gerd  Genitourinary:  Negative for difficulty urinating and dysuria.  Musculoskeletal:  Positive for arthralgias, back pain and neck pain.  Skin:  Positive for rash (eczema).  Neurological:  Positive for light-headedness (occ). Negative for headaches.  Psychiatric/Behavioral:  Negative for dysphoric mood. The patient is not nervous/anxious.        Objective:   Vitals:   08/22/24 1328  BP: 106/78  Pulse: 62  Temp: 98 F (36.7 C)  SpO2: 99%   Filed Weights   08/22/24 1328  Weight: 204 lb (92.5 kg)   Body mass index is 27.67 kg/m.  BP Readings from Last 3 Encounters:  08/22/24 106/78  06/15/24 118/70  04/02/24 101/73    Wt Readings from Last 3 Encounters:  08/22/24 204 lb (92.5 kg)  08/15/24 207 lb (93.9 kg)  04/01/24 207 lb (93.9 kg)      Physical Exam Constitutional: He appears well-developed and well-nourished. No  distress.  HENT:  Head: Normocephalic and atraumatic.  Right Ear: External ear normal.  Left Ear: External ear normal.  Normal ear canals and TM b/l  Mouth/Throat: Oropharynx is clear and moist. Eyes: Conjunctivae and EOM are normal.  Neck: Neck supple. No tracheal deviation present. No thyromegaly present.  No carotid bruit  Cardiovascular: Normal rate, regular rhythm, normal heart sounds and intact distal pulses.   No murmur heard.  No lower extremity edema. Pulmonary/Chest: Effort normal and breath sounds normal. No respiratory distress. He has no wheezes. He has no rales.  Abdominal: Soft. He exhibits no distension. There is no tenderness.  Genitourinary: deferred  Lymphadenopathy:   He has no cervical adenopathy.  Skin: Skin  is warm and dry. He is not diaphoretic.  Psychiatric: He has a normal mood and affect. His behavior is normal.    Diabetic Foot Exam - Simple   Simple Foot Form Diabetic Foot exam was performed with the following findings: Yes 08/22/2024  1:53 PM  Visual Inspection No deformities, no ulcerations, no other skin breakdown bilaterally: Yes Sensation Testing Intact to touch and monofilament testing bilaterally: Yes Pulse Check Posterior Tibialis and Dorsalis pulse intact bilaterally: Yes Comments Right 4th toenail onychomycosis          Assessment & Plan:   Physical exam: Screening blood work  ordered Exercise   none Weight - overweight Substance abuse   none   Reviewed recommended immunizations.  Flu and prevnar 20 vaccines given    Health Maintenance  Topic Date Due   Pneumococcal Vaccine: 50+ Years (2 of 2 - PCV) 12/04/2017   OPHTHALMOLOGY EXAM  11/28/2022   Influenza Vaccine  07/08/2024   HEMOGLOBIN A1C  08/18/2024   COVID-19 Vaccine (1 - 2024-25 season) 09/06/2024 (Originally 08/08/2024)   Diabetic kidney evaluation - Urine ACR  02/15/2025   Diabetic kidney evaluation - eGFR measurement  04/01/2025   Medicare Annual Wellness (AWV)  08/15/2025   FOOT EXAM  08/22/2025   Colonoscopy  10/28/2028   DTaP/Tdap/Td (3 - Td or Tdap) 08/04/2032   Hepatitis C Screening  Completed   HIV Screening  Completed   HPV VACCINES  Aged Out   Meningococcal B Vaccine  Aged Out   Hepatitis B Vaccines 19-59 Average Risk  Discontinued   Zoster Vaccines- Shingrix   Discontinued     See Problem List for Assessment and Plan of chronic medical problems.

## 2024-08-22 ENCOUNTER — Ambulatory Visit: Payer: Self-pay | Admitting: Internal Medicine

## 2024-08-22 ENCOUNTER — Encounter: Payer: Self-pay | Admitting: Internal Medicine

## 2024-08-22 ENCOUNTER — Ambulatory Visit: Admitting: Internal Medicine

## 2024-08-22 VITALS — BP 106/78 | HR 62 | Temp 98.0°F | Ht 72.0 in | Wt 204.0 lb

## 2024-08-22 DIAGNOSIS — Z0000 Encounter for general adult medical examination without abnormal findings: Secondary | ICD-10-CM | POA: Diagnosis not present

## 2024-08-22 DIAGNOSIS — Z125 Encounter for screening for malignant neoplasm of prostate: Secondary | ICD-10-CM | POA: Diagnosis not present

## 2024-08-22 DIAGNOSIS — L309 Dermatitis, unspecified: Secondary | ICD-10-CM

## 2024-08-22 DIAGNOSIS — K219 Gastro-esophageal reflux disease without esophagitis: Secondary | ICD-10-CM

## 2024-08-22 DIAGNOSIS — E559 Vitamin D deficiency, unspecified: Secondary | ICD-10-CM | POA: Diagnosis not present

## 2024-08-22 DIAGNOSIS — E1169 Type 2 diabetes mellitus with other specified complication: Secondary | ICD-10-CM

## 2024-08-22 DIAGNOSIS — E538 Deficiency of other specified B group vitamins: Secondary | ICD-10-CM

## 2024-08-22 DIAGNOSIS — I2510 Atherosclerotic heart disease of native coronary artery without angina pectoris: Secondary | ICD-10-CM

## 2024-08-22 DIAGNOSIS — E785 Hyperlipidemia, unspecified: Secondary | ICD-10-CM

## 2024-08-22 DIAGNOSIS — Z23 Encounter for immunization: Secondary | ICD-10-CM

## 2024-08-22 DIAGNOSIS — G4733 Obstructive sleep apnea (adult) (pediatric): Secondary | ICD-10-CM

## 2024-08-22 LAB — COMPREHENSIVE METABOLIC PANEL WITH GFR
ALT: 22 U/L (ref 0–53)
AST: 21 U/L (ref 0–37)
Albumin: 4.8 g/dL (ref 3.5–5.2)
Alkaline Phosphatase: 60 U/L (ref 39–117)
BUN: 10 mg/dL (ref 6–23)
CO2: 26 meq/L (ref 19–32)
Calcium: 10 mg/dL (ref 8.4–10.5)
Chloride: 100 meq/L (ref 96–112)
Creatinine, Ser: 0.98 mg/dL (ref 0.40–1.50)
GFR: 86.08 mL/min (ref >60.00)
Glucose, Bld: 100 mg/dL — ABNORMAL HIGH (ref 70–99)
Potassium: 4.2 meq/L (ref 3.5–5.1)
Sodium: 137 meq/L (ref 135–145)
Total Bilirubin: 1.2 mg/dL (ref 0.2–1.2)
Total Protein: 7.6 g/dL (ref 6.0–8.3)

## 2024-08-22 LAB — LIPID PANEL
Cholesterol: 75 mg/dL (ref 0–200)
HDL: 31.2 mg/dL — ABNORMAL LOW (ref >39.00)
LDL Cholesterol: 21 mg/dL (ref 0–99)
NonHDL: 44.13
Total CHOL/HDL Ratio: 2
Triglycerides: 118 mg/dL (ref 0.0–149.0)
VLDL: 23.6 mg/dL (ref 0.0–40.0)

## 2024-08-22 LAB — CBC
HCT: 43.5 % (ref 39.0–52.0)
Hemoglobin: 14.7 g/dL (ref 13.0–17.0)
MCHC: 33.8 g/dL (ref 30.0–36.0)
MCV: 82.3 fl (ref 78.0–100.0)
Platelets: 206 K/uL (ref 150.0–400.0)
RBC: 5.28 Mil/uL (ref 4.22–5.81)
RDW: 14 % (ref 11.5–15.5)
WBC: 4.2 K/uL (ref 4.0–10.5)

## 2024-08-22 LAB — TSH: TSH: 0.86 u[IU]/mL (ref 0.35–5.50)

## 2024-08-22 LAB — PSA, MEDICARE: PSA: 0.25 ng/mL (ref 0.10–4.00)

## 2024-08-22 LAB — VITAMIN B12: Vitamin B-12: 680 pg/mL (ref 211–911)

## 2024-08-22 LAB — HEMOGLOBIN A1C: Hgb A1c MFr Bld: 6.9 % — ABNORMAL HIGH (ref 4.6–6.5)

## 2024-08-22 LAB — VITAMIN D 25 HYDROXY (VIT D DEFICIENCY, FRACTURES): VITD: 42.03 ng/mL (ref 30.00–100.00)

## 2024-08-22 MED ORDER — SILDENAFIL CITRATE 20 MG PO TABS: ORAL_TABLET | ORAL | 5 refills | Status: AC

## 2024-08-22 MED ORDER — CICLOPIROX 8 % EX SOLN: Freq: Every day | CUTANEOUS | 2 refills | Status: AC

## 2024-08-22 NOTE — Assessment & Plan Note (Signed)
 Chronic Following with cardiology-Dr. Servando Salina Denies symptoms consistent with angina LDL at goal BP well-controlled Sugars well-controlled Encouraged healthy diet Encouraged regular exercise

## 2024-08-22 NOTE — Assessment & Plan Note (Signed)
 Chronic Seeing Dermatology On b/l lower legs Topical steroids not effective Can not afford injectable medication

## 2024-08-22 NOTE — Assessment & Plan Note (Signed)
Chronic GERD controlled Continue pantoprazole 40 mg daily, famotidine 20 mg daily

## 2024-08-22 NOTE — Assessment & Plan Note (Signed)
 Chronic Lab Results  Component Value Date   LDLCALC 20 02/16/2024   Check lipid panel, CMP, TSH Continue Crestor  5 mg daily Stressed regular exercise, healthy diet

## 2024-08-22 NOTE — Assessment & Plan Note (Signed)
 Chronic Did not tolerate cpap

## 2024-08-22 NOTE — Assessment & Plan Note (Signed)
 Chronic Continue B12 supplementation Check B12 level

## 2024-08-22 NOTE — Assessment & Plan Note (Signed)
 Chronic Taking vitamin D daily Check vitamin D level

## 2024-08-24 ENCOUNTER — Telehealth: Payer: Self-pay

## 2024-08-24 NOTE — Telephone Encounter (Signed)
 Copied from CRM #[id number]. Topic: Clinical - Medical Advice >> Aug 24, 2024 12:19 PM Aleatha C wrote: Reason for CRM: Patient had a bad reaction to his pneumonia shot, couldn't eat, sleep, and right arm couldn't move,lack energy as well and wanted to inform Dr burns of that, as well as have someone call him about his blood work results that he had done

## 2024-08-25 NOTE — Telephone Encounter (Signed)
 Spoke with patient today.  He reports coming down with fever, body aches and soreness after flu and pneumonia vaccine.  Answered all questions and patient all set.

## 2024-08-25 NOTE — Telephone Encounter (Signed)
 Called and left message for patient to return call to clinic.

## 2024-10-04 DIAGNOSIS — M5451 Vertebrogenic low back pain: Secondary | ICD-10-CM | POA: Diagnosis not present

## 2024-10-04 DIAGNOSIS — M5416 Radiculopathy, lumbar region: Secondary | ICD-10-CM | POA: Diagnosis not present

## 2024-10-04 DIAGNOSIS — G8929 Other chronic pain: Secondary | ICD-10-CM | POA: Diagnosis not present

## 2024-10-04 DIAGNOSIS — M542 Cervicalgia: Secondary | ICD-10-CM | POA: Diagnosis not present

## 2024-10-13 ENCOUNTER — Encounter: Payer: Self-pay | Admitting: Dermatology

## 2024-10-13 ENCOUNTER — Ambulatory Visit: Admitting: Dermatology

## 2024-10-13 DIAGNOSIS — L299 Pruritus, unspecified: Secondary | ICD-10-CM

## 2024-10-13 DIAGNOSIS — L209 Atopic dermatitis, unspecified: Secondary | ICD-10-CM | POA: Diagnosis not present

## 2024-10-13 DIAGNOSIS — L309 Dermatitis, unspecified: Secondary | ICD-10-CM

## 2024-10-13 NOTE — Patient Instructions (Addendum)
 VISIT SUMMARY:  You are a 58 year old male with a history of eczema, particularly affecting your hands and legs. During your visit, we discussed your persistent symptoms, including the itchiness and dryness of your hands and the dry skin on your legs. We reviewed your previous treatments and addressed the issues with your current medication regimen.  YOUR PLAN:  -ATOPIC DERMATITIS INVOLVING HANDS AND LEGS:  Atopic dermatitis is a chronic skin condition that causes itchy and inflamed skin.   For your hands, we have provided samples of Anzuptgo to be applied once daily at bedtime, and you should wear gloves at night after applying it.   For your legs, you should continue using Eucerin eczema therapy and moisturize daily with Vaseline on damp skin after showering. We will also reinitiate Dupixent  with the help of a patient assistance program to cover the costs.  INSTRUCTIONS:  We will contact you in three weeks to check on the status of your Dupixent  prescription. Please follow the treatment plan as discussed and reach out if you have any questions or concerns.  Important Information  Due to recent changes in healthcare laws, you may see results of your pathology and/or laboratory studies on MyChart before the doctors have had a chance to review them. We understand that in some cases there may be results that are confusing or concerning to you. Please understand that not all results are received at the same time and often the doctors may need to interpret multiple results in order to provide you with the best plan of care or course of treatment. Therefore, we ask that you please give us  2 business days to thoroughly review all your results before contacting the office for clarification. Should we see a critical lab result, you will be contacted sooner.   If You Need Anything After Your Visit  If you have any questions or concerns for your doctor, please call our main line at [phone] If no  one answers, please leave a voicemail as directed and we will return your call as soon as possible. Messages left after 4 pm will be answered the following business day.   You may also send us  a message via MyChart. We typically respond to MyChart messages within 1-2 business days.  For prescription refills, please ask your pharmacy to contact our office. Our fax number is [phone].  If you have an urgent issue when the clinic is closed that cannot wait until the next business day, you can page your doctor at the number below.    Please note that while we do our best to be available for urgent issues outside of office hours, we are not available 24/7.   If you have an urgent issue and are unable to reach us , you may choose to seek medical care at your doctor's office, retail clinic, urgent care center, or emergency room.  If you have a medical emergency, please immediately call 911 or go to the emergency department. In the event of inclement weather, please call our main line at [phone] for an update on the status of any delays or closures.  Dermatology Medication Tips: Please keep the boxes that topical medications come in in order to help keep track of the instructions about where and how to use these. Pharmacies typically print the medication instructions only on the boxes and not directly on the medication tubes.   If your medication is too expensive, please contact our office at [phone] or send us  a message through MyChart.  We are unable to tell what your co-pay for medications will be in advance as this is different depending on your insurance coverage. However, we may be able to find a substitute medication at lower cost or fill out paperwork to get insurance to cover a needed medication.   If a prior authorization is required to get your medication covered by your insurance company, please allow us  1-2 business days to complete this process.  Drug prices often vary  depending on where the prescription is filled and some pharmacies may offer cheaper prices.  The website www.goodrx.com contains coupons for medications through different pharmacies. The prices here do not account for what the cost may be with help from insurance (it may be cheaper with your insurance), but the website can give you the price if you did not use any insurance.  - You can print the associated coupon and take it with your prescription to the pharmacy.  - You may also stop by our office during regular business hours and pick up a GoodRx coupon card.  - If you need your prescription sent electronically to a different pharmacy, notify our office through [facility] or by phone at [phone]

## 2024-10-13 NOTE — Progress Notes (Signed)
   Follow-Up Visit   Subjective  [name] is a 58 y.o. male who presents for the following: Atopic Dermatitis  Patient (and/or pt guardian) consented to the use of AI-assisted tools for note generation.   [name] is here for a follow up for atopic dermatitis. His last visit was on 06/15/24. He was given Opzelura , clobetasol  and a script for Dupixent  was sent. He states that the creams did not help him at all so he is currently only using Vaseline. [name] states that he was contacted about starting Dupixent  but was told that it would be $1500 to start injections.   The following portions of the chart were reviewed this encounter and updated as appropriate: medications, allergies, medical history  Review of Systems:  No other skin or systemic complaints except as noted in HPI or Assessment and Plan.  Objective  Well appearing patient in no apparent distress; mood and affect are within normal limits.  Areas Examined: Hands and Feet  Relevant physical exam findings are noted in the Assessment and Plan.    Assessment & Plan   Atopic dermatitis involving hands and legs Chronic atopic dermatitis with involvement of hands and legs. Hands remain flared with itchiness, while legs have improved but remain dry. Previous treatments with clobetasol  and Opselaura were ineffective. Dupixent  was prescribed but discontinued due to cost concerns. New treatment plan includes Anzuptgo for hands and Eucerin eczema therapy for legs. Dupixent  will be reinitiated with patient assistance program to cover costs.  IGA: 3, Hands Involved, 8/10 itch  - Reinitiated Dupixent  with patient assistance program to cover costs. - Provided samples of Anzuptgo for hands, to be used once daily at bedtime. - Instructed to apply Eucerin eczema therapy on hands and legs after Anzuptgo. - Advised to use gloves at night after applying Anzuptgo. - Instructed to moisturize legs daily with Vaseline on damp skin after  showering. - Will contact in three weeks regarding Dupixent  status.          No follow-ups on file.  I, Gordan Beams, CMA, am acting as scribe for [employer], DO.   Documentation: I have reviewed the above documentation for accuracy and completeness, and I agree with the above.  Delon Lenis, DO

## 2024-10-14 ENCOUNTER — Telehealth: Payer: Self-pay | Admitting: Cardiology

## 2024-10-14 ENCOUNTER — Other Ambulatory Visit: Payer: Self-pay | Admitting: Primary Care

## 2024-10-14 DIAGNOSIS — E782 Mixed hyperlipidemia: Secondary | ICD-10-CM

## 2024-10-14 NOTE — Telephone Encounter (Signed)
 For farther refills patient will need appointment

## 2024-10-19 ENCOUNTER — Ambulatory Visit: Payer: Self-pay

## 2024-10-19 ENCOUNTER — Other Ambulatory Visit: Payer: Self-pay | Admitting: Allergy & Immunology

## 2024-10-19 NOTE — Telephone Encounter (Signed)
 FYI Only or Action Required?: Action required by provider: referral request. Pt requesting referral be sent to his urologist of 20 years at [facility] in [place] d/t recently changing insurance.  Patient was last seen in primary care on 08/22/2024 by Geofm Glade PARAS, MD.  Called Nurse Triage reporting Dental Pain.  Symptoms began several days ago.  Interventions attempted: OTC medications: Tylenol , ibuprofen.  Symptoms are: gradually worsening.  Triage Disposition: Call Dentist When Office is Open  Patient/caregiver understands and will follow disposition?: Yes  Copied from CRM [id number]. Topic: Clinical - Red Word Triage >> Oct 19, 2024  4:29 PM Drema MATSU wrote: Red Word that prompted transfer to Nurse Triage: Patient stated that his pain right ear (from right tooth). He thinks that it may be an infection and wants to know if something can be called in for it until he can go to the dentist. Reason for Disposition  Toothache present > 24 hours  All other males with painful urination  Answer Assessment - Initial Assessment Questions Pt reports onset mild toothache on bottom back right side of mouth 6 months ago, now 8/10 pain over past 2 days. Taking tylenol  and ibuprofen which have been helpful. Pt talked to dentist today, told pt he would need root canal. Pt reports insurance is working on finding place to get a root canal, waiting to hear back. Needing something for pain until then. Advised pt to call Dentist tomorrow morning to see if rx can be called in. Confirmed pts phone and pharmacy on file are correct. Advised UC or ED for worsening symptoms.  1. LOCATION: Which tooth is hurting?  (e.g., right-side/left-side, upper/lower, front/back)     Bottom right back  2. ONSET: When did the toothache start?  (e.g., hours, days)      Hurt a little 6 months ago. Severe pain past 2 days  3. SEVERITY: How bad is the toothache?  (Scale 1-10; mild, moderate or  severe)     8/10  4. SWELLING: Is there any visible swelling of your face?     Denies visible swelling  5. OTHER SYMPTOMS: Do you have any other symptoms? (e.g., fever)     Right ear and right jaw pain, itchiness right ear  Answer Assessment - Initial Assessment Questions Pt reports onset of burning with urination and with frequency a few days ago. Reports being established with [facility] in [place] for 20 years. Pt scheduled Monday appt for routine f/u and for acute urinary symptoms. Pt reports office told him when scheduling appt to ask PCP to send referral for insurance coverage purposes since his insurance has recently changed. Pt asking to have a referral for urology sent by PCP. Advised pt see provider in next 24 hrs, declines and would rather wait until Monday despite education. Advised UC or ED for worsening symptoms until then.  1. SEVERITY: How bad is the pain?  (e.g., Scale 1-10; mild, moderate, or severe)     8/10 burning with urination. Discomfort in bladder area.  2. FREQUENCY: How many times have you had painful urination today?      Many times  3. PATTERN: Is pain present every time you urinate or just sometimes?      Every time pt urinates  4. ONSET: When did the painful urination start?      Past few days  5. FEVER: Do you have a fever? If Yes, ask: What is your temperature, how was it measured, and when did it  start?     Denies  6. PAST UTI: Have you had a urine infection before? If Yes, ask: When was the last time? and What happened that time?      Yes  7. CAUSE: What do you think is causing the painful urination?      Unsure, drinks a lot of water  8. OTHER SYMPTOMS: Do you have any other symptoms? (e.g., flank pain, penis discharge, scrotal pain, blood in urine)     Denies above.  Protocols used: Toothache-A-AH, Urination Pain - Male-A-AH

## 2024-10-20 ENCOUNTER — Other Ambulatory Visit: Payer: Self-pay

## 2024-10-20 MED ORDER — DUPIXENT 300 MG/2ML ~~LOC~~ SOAJ: 300.0000 mg | SUBCUTANEOUS | 4 refills | Status: AC

## 2024-10-21 DIAGNOSIS — M5412 Radiculopathy, cervical region: Secondary | ICD-10-CM | POA: Diagnosis not present

## 2024-10-24 DIAGNOSIS — N401 Enlarged prostate with lower urinary tract symptoms: Secondary | ICD-10-CM | POA: Diagnosis not present

## 2024-10-24 DIAGNOSIS — R300 Dysuria: Secondary | ICD-10-CM | POA: Diagnosis not present

## 2024-10-31 NOTE — Telephone Encounter (Signed)
*  STAT* If patient is at the pharmacy, call can be transferred to refill team.   1. Which medications need to be refilled? (please list name of each medication and dose if known)   rosuvastatin  (CRESTOR ) 5 MG tablet   2. Would you like to learn more about the convenience, safety, & potential cost savings by using the [facility]?   3. Are you open to using the [facility] (Type [facility]. ).  4. Which pharmacy/location (including street and city if local pharmacy) is medication to be sent to?  [id number] - [place], [place] - [address]   5. Do they need a 30 day or 90 day supply?   Patient stated he is completely out of this medication.  Patient has appointment scheduled with Dr. Sheena on 2/25.

## 2024-11-01 NOTE — Telephone Encounter (Signed)
 Pt scheduled to see Dr. Sheena 02/01/25, refill sent.

## 2024-11-07 ENCOUNTER — Telehealth: Payer: Self-pay

## 2024-11-07 ENCOUNTER — Telehealth: Payer: Self-pay | Admitting: Internal Medicine

## 2024-11-07 NOTE — Telephone Encounter (Signed)
 Patient left form for his medication that needs to be filled out and faxed back.  Form up front in Dr. Geofm box

## 2024-11-07 NOTE — Telephone Encounter (Signed)
 Copied from CRM #[id number]. Topic: General - Other >> Nov 07, 2024 12:45 PM Brittany M wrote: Reason for CRM: patient or other family member is going to be dropping off a form either today or tomorrow-for his diabetes medication to have signed by Dr Geofm

## 2024-11-08 NOTE — Telephone Encounter (Signed)
Paperwork retrieved.

## 2024-11-11 NOTE — Telephone Encounter (Signed)
 Form completed and awaiting signature from [place].

## 2024-11-14 ENCOUNTER — Telehealth: Payer: Self-pay | Admitting: Dermatology

## 2024-11-14 NOTE — Telephone Encounter (Signed)
 Form placed to go out in mail today.  Sent to   [employer] [address] [place], [place] [address]

## 2024-11-14 NOTE — Telephone Encounter (Signed)
 Patient's last visit was 10/13/24 and he was seen for Atopic Dermatitis Patient called to state that his Dupixent  rx is too expensive and would like to be advised on options for this medication or if there is another medication that could be prescribed.

## 2024-11-14 NOTE — Telephone Encounter (Unsigned)
 Copied from CRM [id number]. Topic: General - Other >> Nov 14, 2024  1:08 PM Wess RAMAN wrote: Reason for CRM: Patient stated he does not need to pick up the form for JANUVIA  100 MG tablet. He needs it mailed to the company on the envelope.  Callback #: [phone]

## 2024-11-16 ENCOUNTER — Telehealth: Payer: Self-pay

## 2024-11-16 NOTE — Telephone Encounter (Signed)
 Spoke with pt. I advised I met with our FRM from Dupixent  and they advised everything has been received from our office and the patient has to contact them to attest to financial hardship and has complete the MP3. Patient states he will contact [employer] to provide the needed information.

## 2024-11-16 NOTE — Telephone Encounter (Signed)
 Copied from CRM #[id number]. Topic: Clinical - Prescription Issue >> Nov 16, 2024  2:34 PM Rea ORN wrote: Reason for CRM: Pt is requesting a call back from [place]. He stated he was contacted by the [employer], that supplies him with Januvia , because they received the form but section 4 is missing PCP signature. He said it is urgent that this be corrected because he doesn't want to run out of the rx.  Please call back [phone]

## 2024-11-17 NOTE — Telephone Encounter (Signed)
 Form re-faxed again with additional signature.

## 2024-11-17 NOTE — Telephone Encounter (Signed)
 Message left patient today that info had been sent.

## 2024-11-21 DIAGNOSIS — M5416 Radiculopathy, lumbar region: Secondary | ICD-10-CM | POA: Diagnosis not present

## 2024-11-21 DIAGNOSIS — M5412 Radiculopathy, cervical region: Secondary | ICD-10-CM | POA: Diagnosis not present

## 2024-11-23 NOTE — Telephone Encounter (Signed)
 Pt called in to state that form is again incorrect with the wrong dates. He states that dates were put on the form are from 2020 and not for 2025. Please correct and resend again and pt is requesting this to please be done today. Call pt or reach out via mychart to advise as he is concerned that he will not get his medicine and it will start the process all over again

## 2024-11-25 ENCOUNTER — Telehealth: Payer: Self-pay

## 2024-11-25 NOTE — Telephone Encounter (Signed)
 Copied from CRM #[id number]. Topic: Clinical - Prescription Issue >> Nov 16, 2024  2:34 PM Rea ORN wrote: Reason for CRM: Pt is requesting a call back from [place]. He stated he was contacted by the [employer], that supplies him with Januvia , because they received the form but section 4 is missing PCP signature. He said it is urgent that this be corrected because he doesn't want to run out of the rx.  Please call back [phone] >> Nov 25, 2024 11:37 AM Franky GRADE wrote: Patient is calling to speak with Tobias regarding the forms that were submitted incorrectly twice, he wants to follow up and check if the forms had been corrected and resent as the deadline is 12/07/2024.

## 2024-11-25 NOTE — Telephone Encounter (Signed)
 Form has been refaxed and patient notified today.

## 2024-11-26 ENCOUNTER — Other Ambulatory Visit: Payer: Self-pay | Admitting: Internal Medicine

## 2025-02-01 ENCOUNTER — Ambulatory Visit: Admitting: Cardiology

## 2025-02-20 ENCOUNTER — Ambulatory Visit: Admitting: Internal Medicine

## 2025-08-24 ENCOUNTER — Encounter: Admitting: Internal Medicine

## 2025-08-24 ENCOUNTER — Ambulatory Visit
